# Patient Record
Sex: Female | Born: 1935 | Race: Black or African American | Hispanic: No | Smoking: Never smoker
Health system: Southern US, Community
[De-identification: ages and names within clinical notes are randomized; demographics above are authoritative.]

## PROBLEM LIST (undated history)

## (undated) DIAGNOSIS — E785 Hyperlipidemia, unspecified: Secondary | ICD-10-CM

## (undated) DIAGNOSIS — E43 Unspecified severe protein-calorie malnutrition: Secondary | ICD-10-CM

## (undated) DIAGNOSIS — W19XXXA Unspecified fall, initial encounter: Secondary | ICD-10-CM

## (undated) DIAGNOSIS — I639 Cerebral infarction, unspecified: Secondary | ICD-10-CM

## (undated) DIAGNOSIS — I2510 Atherosclerotic heart disease of native coronary artery without angina pectoris: Secondary | ICD-10-CM

## (undated) DIAGNOSIS — E559 Vitamin D deficiency, unspecified: Secondary | ICD-10-CM

## (undated) DIAGNOSIS — F0390 Unspecified dementia without behavioral disturbance: Secondary | ICD-10-CM

## (undated) DIAGNOSIS — R55 Syncope and collapse: Secondary | ICD-10-CM

## (undated) DIAGNOSIS — D72829 Elevated white blood cell count, unspecified: Secondary | ICD-10-CM

## (undated) DIAGNOSIS — I69391 Dysphagia following cerebral infarction: Secondary | ICD-10-CM

## (undated) DIAGNOSIS — S72002A Fracture of unspecified part of neck of left femur, initial encounter for closed fracture: Secondary | ICD-10-CM

## (undated) DIAGNOSIS — N183 Chronic kidney disease, stage 3 (moderate): Secondary | ICD-10-CM

## (undated) DIAGNOSIS — E876 Hypokalemia: Secondary | ICD-10-CM

## (undated) DIAGNOSIS — I10 Essential (primary) hypertension: Secondary | ICD-10-CM

## (undated) DIAGNOSIS — N179 Acute kidney failure, unspecified: Secondary | ICD-10-CM

## (undated) DIAGNOSIS — F418 Other specified anxiety disorders: Secondary | ICD-10-CM

## (undated) DIAGNOSIS — Z8673 Personal history of transient ischemic attack (TIA), and cerebral infarction without residual deficits: Secondary | ICD-10-CM

## (undated) DIAGNOSIS — G459 Transient cerebral ischemic attack, unspecified: Secondary | ICD-10-CM

## (undated) HISTORY — DX: Unspecified severe protein-calorie malnutrition: E43

## (undated) HISTORY — DX: Unspecified fall, initial encounter: W19.XXXA

## (undated) HISTORY — DX: Fracture of unspecified part of neck of left femur, initial encounter for closed fracture: S72.002A

## (undated) HISTORY — PX: ABDOMINAL HYSTERECTOMY: SUR658

## (undated) HISTORY — DX: Personal history of transient ischemic attack (TIA), and cerebral infarction without residual deficits: Z86.73

## (undated) HISTORY — DX: Acute kidney failure, unspecified: N17.9

## (undated) HISTORY — DX: Hypokalemia: E87.6

## (undated) HISTORY — DX: Elevated white blood cell count, unspecified: D72.829

## (undated) HISTORY — DX: Cerebral infarction, unspecified: I63.9

## (undated) HISTORY — DX: Chronic kidney disease, stage 3 (moderate): N18.3

## (undated) HISTORY — DX: Vitamin D deficiency, unspecified: E55.9

## (undated) HISTORY — DX: Syncope and collapse: R55

## (undated) HISTORY — DX: Dysphagia following cerebral infarction: I69.391

---

## 2009-02-06 ENCOUNTER — Emergency Department (HOSPITAL_COMMUNITY): Admission: EM | Admit: 2009-02-06 | Discharge: 2009-02-07 | Payer: Self-pay | Admitting: Emergency Medicine

## 2009-02-13 ENCOUNTER — Ambulatory Visit (HOSPITAL_COMMUNITY): Admission: RE | Admit: 2009-02-13 | Discharge: 2009-02-13 | Payer: Self-pay | Admitting: Emergency Medicine

## 2010-01-09 ENCOUNTER — Encounter: Admission: RE | Admit: 2010-01-09 | Discharge: 2010-01-31 | Payer: Self-pay | Admitting: Internal Medicine

## 2010-12-10 LAB — GLUCOSE, CAPILLARY

## 2010-12-10 LAB — DIFFERENTIAL
Lymphocytes Relative: 16 % (ref 12–46)
Lymphs Abs: 1 10*3/uL (ref 0.7–4.0)
Monocytes Absolute: 0.4 10*3/uL (ref 0.1–1.0)
Monocytes Relative: 5 % (ref 3–12)
Neutro Abs: 5 10*3/uL (ref 1.7–7.7)

## 2010-12-10 LAB — POCT CARDIAC MARKERS
CKMB, poc: <1 ng/mL — ABNORMAL LOW (ref 1.0–8.0)
Troponin i, poc: <0.05 ng/mL (ref 0.00–0.09)

## 2010-12-10 LAB — POCT I-STAT, CHEM 8
BUN: 25 mg/dL — ABNORMAL HIGH (ref 6–23)
Chloride: 106 mEq/L (ref 96–112)
Creatinine, Ser: 1.5 mg/dL — ABNORMAL HIGH (ref 0.4–1.2)
Hemoglobin: 11.9 g/dL — ABNORMAL LOW (ref 12.0–15.0)
Potassium: 4.2 mEq/L (ref 3.5–5.1)
Sodium: 138 mEq/L (ref 135–145)

## 2010-12-10 LAB — URINALYSIS, ROUTINE W REFLEX MICROSCOPIC
Specific Gravity, Urine: 1.012 (ref 1.005–1.030)
Urobilinogen, UA: 0.2 mg/dL (ref 0.0–1.0)

## 2010-12-10 LAB — CBC
Hemoglobin: 11.3 g/dL — ABNORMAL LOW (ref 12.0–15.0)
RBC: 3.99 MIL/uL (ref 3.87–5.11)

## 2010-12-10 LAB — URINE MICROSCOPIC-ADD ON

## 2010-12-10 LAB — URINE CULTURE

## 2011-01-15 NOTE — Procedures (Signed)
EEG NUMBER:  [id number].   HISTORY:  This patient is an 77 year old with a history of a seizure  type event the week prior to the study.  The patient had confusion  rolling back of the head and urinary incontinence.  The patient had been  evaluated for seizures.   MEDICATIONS:  Atenolol, lorazepam, Aricept and Crestor.   This is a routine EEG.  No skull defects noted.   EEG CLASSIFICATION:  Normal awake.   DESCRIPTION OF THE RECORDING:  Background rhythm of this recording  consists of fairly well-modulated medium amplitude alpha rhythm of 9 Hz  that is reactive to eye opening and closure.  As the record progresses,  the patient remains in the waking state throughout the recording.  Photic stimulation and hyperventilation were not performed.  At no time  during the recording there appeared to be evidence of spike, spike wave  discharges, or evidence of focal slowing.  EKG monitor shows no evidence  of cardiac rhythm abnormalities with a heart rate of 72.   IMPRESSION:  This is a normal EEG recording in the waking state.  No  evidence of ictal or interictal discharges were seen.      Marlan Palau, M.D.  Electronically Signed     EAV:[id number]  D:  02/13/2009 19:37:06  T:  02/14/2009 07:45:38  Job #:  [id number]

## 2012-05-18 ENCOUNTER — Other Ambulatory Visit: Payer: Self-pay | Admitting: Family Medicine

## 2012-06-09 ENCOUNTER — Other Ambulatory Visit: Payer: Self-pay | Admitting: Family Medicine

## 2012-06-16 ENCOUNTER — Other Ambulatory Visit: Payer: Self-pay | Admitting: Family Medicine

## 2013-05-23 ENCOUNTER — Emergency Department (HOSPITAL_COMMUNITY): Payer: Medicare Other

## 2013-05-23 ENCOUNTER — Encounter (HOSPITAL_COMMUNITY): Payer: Self-pay | Admitting: Emergency Medicine

## 2013-05-23 ENCOUNTER — Inpatient Hospital Stay (HOSPITAL_COMMUNITY)
Admission: EM | Admit: 2013-05-23 | Discharge: 2013-05-25 | DRG: 312 | Disposition: A | Discharge status: Home / Self Care | Payer: Medicare Other | Attending: Internal Medicine | Admitting: Internal Medicine

## 2013-05-23 DIAGNOSIS — R5381 Other malaise: Secondary | ICD-10-CM | POA: Diagnosis present

## 2013-05-23 DIAGNOSIS — R079 Chest pain, unspecified: Secondary | ICD-10-CM | POA: Diagnosis present

## 2013-05-23 DIAGNOSIS — R404 Transient alteration of awareness: Secondary | ICD-10-CM | POA: Diagnosis present

## 2013-05-23 DIAGNOSIS — E876 Hypokalemia: Secondary | ICD-10-CM | POA: Diagnosis present

## 2013-05-23 DIAGNOSIS — Z87891 Personal history of nicotine dependence: Secondary | ICD-10-CM

## 2013-05-23 DIAGNOSIS — I498 Other specified cardiac arrhythmias: Secondary | ICD-10-CM | POA: Diagnosis present

## 2013-05-23 DIAGNOSIS — N390 Urinary tract infection, site not specified: Secondary | ICD-10-CM | POA: Diagnosis present

## 2013-05-23 DIAGNOSIS — I10 Essential (primary) hypertension: Secondary | ICD-10-CM | POA: Diagnosis present

## 2013-05-23 DIAGNOSIS — R55 Syncope and collapse: Principal | ICD-10-CM | POA: Diagnosis present

## 2013-05-23 DIAGNOSIS — Z7982 Long term (current) use of aspirin: Secondary | ICD-10-CM

## 2013-05-23 DIAGNOSIS — I2510 Atherosclerotic heart disease of native coronary artery without angina pectoris: Secondary | ICD-10-CM | POA: Diagnosis present

## 2013-05-23 DIAGNOSIS — Z880 Allergy status to penicillin: Secondary | ICD-10-CM

## 2013-05-23 DIAGNOSIS — N179 Acute kidney failure, unspecified: Secondary | ICD-10-CM | POA: Diagnosis present

## 2013-05-23 HISTORY — DX: Atherosclerotic heart disease of native coronary artery without angina pectoris: I25.10

## 2013-05-23 HISTORY — DX: Unspecified dementia, unspecified severity, without behavioral disturbance, psychotic disturbance, mood disturbance, and anxiety: F03.90

## 2013-05-23 HISTORY — DX: Hyperlipidemia, unspecified: E78.5

## 2013-05-23 HISTORY — DX: Essential (primary) hypertension: I10

## 2013-05-23 LAB — BASIC METABOLIC PANEL
BUN: 17 mg/dL (ref 6–23)
CO2: 28 mEq/L (ref 19–32)
Calcium: 9.6 mg/dL (ref 8.4–10.5)
Chloride: 105 mEq/L (ref 96–112)
Creatinine, Ser: 1.53 mg/dL — ABNORMAL HIGH (ref 0.50–1.10)
GFR calc Af Amer: 33 mL/min — ABNORMAL LOW (ref >90)
GFR calc non Af Amer: 28 mL/min — ABNORMAL LOW (ref >90)
Glucose, Bld: 97 mg/dL (ref 70–99)
Potassium: 3.8 mEq/L (ref 3.5–5.1)
Sodium: 142 mEq/L (ref 135–145)

## 2013-05-23 LAB — CBC WITH DIFFERENTIAL/PLATELET
Basophils Absolute: 0.1 10*3/uL (ref 0.0–0.1)
Basophils Relative: 1 % (ref 0–1)
Eosinophils Absolute: 0.1 10*3/uL (ref 0.0–0.7)
Eosinophils Relative: 1 % (ref 0–5)
HCT: 36.1 % (ref 36.0–46.0)
Hemoglobin: 12.2 g/dL (ref 12.0–15.0)
Lymphocytes Relative: 12 % (ref 12–46)
Lymphs Abs: 1.1 10*3/uL (ref 0.7–4.0)
MCH: 27.7 pg (ref 26.0–34.0)
MCHC: 33.8 g/dL (ref 30.0–36.0)
MCV: 82 fL (ref 78.0–100.0)
Monocytes Absolute: 0.7 10*3/uL (ref 0.1–1.0)
Monocytes Relative: 8 % (ref 3–12)
Neutro Abs: 7.4 10*3/uL (ref 1.7–7.7)
Neutrophils Relative %: 79 % — ABNORMAL HIGH (ref 43–77)
Platelets: 210 10*3/uL (ref 150–400)
RBC: 4.4 MIL/uL (ref 3.87–5.11)
RDW: 13.9 % (ref 11.5–15.5)
WBC: 9.3 10*3/uL (ref 4.0–10.5)

## 2013-05-23 NOTE — ED Notes (Signed)
PA at bedside.

## 2013-05-23 NOTE — ED Notes (Signed)
Pollina, MD made aware the the pt is waiting for admission orders.

## 2013-05-23 NOTE — ED Notes (Signed)
Pt arrived from home by [facility]. C/o weakness for the past couple of days. Daughter stated pt c/o CP last night. No c/o CP at this time. New onset Afib. HR-85 irreg, BP-102/72

## 2013-05-23 NOTE — ED Notes (Signed)
Pt presents with new onset of afib, weakness X a couple of days and a syncopal episode while sitting on toilet. Per daughter LOC lasted approx 1 minute. Pt denies any pain or injury r/t to syncopal episode

## 2013-05-23 NOTE — ED Notes (Signed)
Pt remains in CT at this time.  

## 2013-05-24 ENCOUNTER — Encounter (HOSPITAL_COMMUNITY): Payer: Self-pay | Admitting: Internal Medicine

## 2013-05-24 DIAGNOSIS — R55 Syncope and collapse: Secondary | ICD-10-CM

## 2013-05-24 HISTORY — DX: Syncope and collapse: R55

## 2013-05-24 LAB — URINALYSIS, ROUTINE W REFLEX MICROSCOPIC
Glucose, UA: NEGATIVE mg/dL
Ketones, ur: 15 mg/dL — AB
Nitrite: NEGATIVE
Protein, ur: 30 mg/dL — AB
Specific Gravity, Urine: 1.018 (ref 1.005–1.030)
Urobilinogen, UA: 1 mg/dL (ref 0.0–1.0)
pH: 6 (ref 5.0–8.0)

## 2013-05-24 LAB — TROPONIN I
Troponin I: <0.3 ng/mL (ref <0.30)
Troponin I: <0.3 ng/mL (ref <0.30)

## 2013-05-24 LAB — CBC
Hemoglobin: 11.4 g/dL — ABNORMAL LOW (ref 12.0–15.0)
MCH: 27.9 pg (ref 26.0–34.0)
RBC: 4.08 MIL/uL (ref 3.87–5.11)
WBC: 10 10*3/uL (ref 4.0–10.5)

## 2013-05-24 LAB — BASIC METABOLIC PANEL
CO2: 26 mEq/L (ref 19–32)
Chloride: 105 mEq/L (ref 96–112)
Glucose, Bld: 84 mg/dL (ref 70–99)
Potassium: 3.3 mEq/L — ABNORMAL LOW (ref 3.5–5.1)
Sodium: 141 mEq/L (ref 135–145)

## 2013-05-24 LAB — URINE MICROSCOPIC-ADD ON

## 2013-05-24 MED ORDER — BISMUTH SUBSALICYLATE 262 MG/15ML PO SUSP: 30.0000 mL | Freq: Four times a day (QID) | ORAL | Status: DC | PRN

## 2013-05-24 MED ORDER — LORAZEPAM 0.5 MG PO TABS
0.5000 mg | ORAL_TABLET | Freq: Two times a day (BID) | ORAL | Status: DC
  Administered 2013-05-24 – 2013-05-25 (×4): 0.5 mg via ORAL
  Filled 2013-05-24 (×4): qty 1

## 2013-05-24 MED ORDER — SODIUM CHLORIDE 0.9 % IV SOLN
INTRAVENOUS | Status: DC
  Administered 2013-05-24 – 2013-05-25 (×2): via INTRAVENOUS

## 2013-05-24 MED ORDER — MEMANTINE HCL 10 MG PO TABS
10.0000 mg | ORAL_TABLET | Freq: Two times a day (BID) | ORAL | Status: DC
  Administered 2013-05-24 – 2013-05-25 (×4): 10 mg via ORAL
  Filled 2013-05-24 (×5): qty 1

## 2013-05-24 MED ORDER — SODIUM CHLORIDE 0.9 % IJ SOLN
3.0000 mL | Freq: Two times a day (BID) | INTRAMUSCULAR | Status: DC
  Administered 2013-05-24 (×2): 3 mL via INTRAVENOUS

## 2013-05-24 MED ORDER — POTASSIUM CHLORIDE CRYS ER 20 MEQ PO TBCR
40.0000 meq | EXTENDED_RELEASE_TABLET | Freq: Once | ORAL | Status: AC
  Administered 2013-05-24: 40 meq via ORAL
  Filled 2013-05-24: qty 2

## 2013-05-24 MED ORDER — ATORVASTATIN CALCIUM 10 MG PO TABS
10.0000 mg | ORAL_TABLET | Freq: Every day | ORAL | Status: DC
  Administered 2013-05-24 – 2013-05-25 (×2): 10 mg via ORAL
  Filled 2013-05-24 (×2): qty 1

## 2013-05-24 MED ORDER — HEPARIN SODIUM (PORCINE) 5000 UNIT/ML IJ SOLN
5000.0000 [IU] | Freq: Three times a day (TID) | INTRAMUSCULAR | Status: DC
  Administered 2013-05-24 – 2013-05-25 (×4): 5000 [IU] via SUBCUTANEOUS
  Filled 2013-05-24 (×6): qty 1

## 2013-05-24 MED ORDER — ATENOLOL 12.5 MG HALF TABLET
12.5000 mg | ORAL_TABLET | Freq: Every day | ORAL | Status: DC
  Administered 2013-05-24 – 2013-05-25 (×2): 12.5 mg via ORAL
  Filled 2013-05-24 (×2): qty 1

## 2013-05-24 MED ORDER — ASPIRIN EC 81 MG PO TBEC
81.0000 mg | DELAYED_RELEASE_TABLET | Freq: Every day | ORAL | Status: DC
  Administered 2013-05-24 – 2013-05-25 (×2): 81 mg via ORAL
  Filled 2013-05-24 (×2): qty 1

## 2013-05-24 MED ORDER — LORAZEPAM 0.5 MG PO TABS: 0.5000 mg | ORAL_TABLET | Freq: Two times a day (BID) | ORAL | Status: DC

## 2013-05-24 NOTE — ED Provider Notes (Signed)
CSN: [id number]     Arrival date & time 05/23/13  1629 History   First MD Initiated Contact with Patient 05/23/13 1631     Chief Complaint  Patient presents with  . Atrial Fibrillation  . Weakness  . Loss of Consciousness   (Consider location/radiation/quality/duration/timing/severity/associated sxs/prior Treatment) HPI Patient presents emergency department following a syncopal episode that occurred just prior to arrival.  Daughter is giving the history the patient was unresponsive for 10 minutes.  The patient had had chest pain, and weakness over the last few days.  The patient denies shortness of breath, nausea, vomiting, dizziness, headache, blurred vision, fever, cough, dysuria, seizure or diarrhea.  The patient, states, that she still feels a little weak.  The daughter states the patient had syncope previously, but not for this long period of time     Past Medical History  Diagnosis Date  . Hypertension   . Coronary artery disease    No past surgical history on file. No family history on file. History  Substance Use Topics  . Smoking status: Former Games developer  . Smokeless tobacco: Not on file  . Alcohol Use: No   OB History   Grav Para Term Preterm Abortions TAB SAB Ect Mult Living                 Review of Systems All other systems negative except as documented in the HPI. All pertinent positives and negatives as reviewed in the HPI. Allergies  Penicillins  Home Medications  No current outpatient prescriptions on file. BP 114/59  Pulse 72  Temp(Src) 99 F (37.2 C) (Oral)  Resp 16  Ht 5\' 4"  (1.626 m)  Wt 123 lb 0.3 oz (55.8 kg)  BMI 21.11 kg/m2  SpO2 98% Physical Exam  Nursing note and vitals reviewed. Constitutional: She is oriented to person, place, and time. She appears well-developed and well-nourished.  HENT:  Head: Normocephalic and atraumatic.  Mouth/Throat: Oropharynx is clear and moist.  Eyes: Pupils are equal, round, and reactive to light.  Neck:  Normal range of motion. Neck supple.  Cardiovascular: Normal rate, regular rhythm and normal heart sounds.  Exam reveals no gallop and no friction rub.   No murmur heard. Pulmonary/Chest: Effort normal and breath sounds normal. No respiratory distress.  Abdominal: Soft. Bowel sounds are normal. She exhibits no distension. There is no tenderness.  Musculoskeletal: She exhibits no edema.  Neurological: She is alert and oriented to person, place, and time. She exhibits normal muscle tone. Coordination normal.  Skin: Skin is warm and dry. No rash noted. No erythema.    ED Course  Procedures (including critical care time) Labs Review Labs Reviewed  CBC WITH DIFFERENTIAL - Abnormal; Notable for the following:    Neutrophils Relative % 79 (*)    All other components within normal limits  BASIC METABOLIC PANEL - Abnormal; Notable for the following:    Creatinine, Ser 1.53 (*)    GFR calc non Af Amer 28 (*)    GFR calc Af Amer 33 (*)    All other components within normal limits  URINALYSIS, ROUTINE W REFLEX MICROSCOPIC  CBC  BASIC METABOLIC PANEL  TROPONIN I  TROPONIN I  TROPONIN I   Imaging Review Ct Head Wo Contrast  05/23/2013   CLINICAL DATA:  Altered mental status  EXAM: CT HEAD WITHOUT CONTRAST  TECHNIQUE: Contiguous axial images were obtained from the base of the skull through the vertex without intravenous contrast. Study was obtained within 24 hr of  patient's arrival at the emergency department  COMPARISON:  February 06, 2009  FINDINGS: Moderate diffuse atrophy is stable. There is no mass, hemorrhage, extra-axial fluid collection, or midline shift. There is patchy small vessel disease in the centra semiovale bilaterally. There is a prior infarct at the gray-white junction of the anterior right temporal lobe at the level of the superior insula on the right the with some involvement of the right external and extreme capsules on the right anteriorly. There is evidence of a prior small infarct  in the anterior limb of the left external capsule. There is evidence of a prior tiny infarct in the left thalamus. No acute appearing infarct is seen.  Bony calvarium appears intact. Mastoid air cells are clear. There is mild mucosal thickening in several ethmoid air cells bilaterally. There is a small benign appearing enostosis in the right frontal region without surrounding edema, stable.  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 There is no demonstrable mass, hemorrhage, or acute appearing infarct.   Electronically Signed   By: Bretta Bang   On: 05/23/2013 18:43   patient be admitted to the hospital for syncope, and further evaluation.  Patient is advised of the plan and all questions were answered.  Spoke with the, [facility], who will be down to evaluate the patient for admission  MDM   1. Syncope     Date: 05/24/2013  Rate: 89  Rhythm: normal sinus rhythm  QRS Axis: normal  Intervals: normal  ST/T Wave abnormalities: nonspecific T wave changes  Conduction Disutrbances:Bigemny  Narrative Interpretation:   Old EKG Reviewed: unchanged      Carlyle Dolly, PA-C 05/24/13 1627  Carlyle Dolly, PA-C 05/24/13 [phone]

## 2013-05-24 NOTE — H&P (Signed)
[facility] History and Physical  Ariel Douglas ZOX:[id number] DOB: July 24, 1919 DOA: 05/23/2013  Referring physician: ED PCP: No primary provider on file.    Chief Complaint: Syncope  HPI: Ariel Douglas is a 79 y.o. female who presents to the ED with weakness over the past couple of days.  Patient did have abdominal pain last night that woke her up but this completely resolved with peptobismol.  Today she had a syncopal episode while using the bathroom and was slumped against the counter.  LOC lasted about 1 min, and she was confused after the episode still some 10 min later.  Patient is currently back to normal mentation.  No injury or pain due to trauma from syncopal episode, no chest pain related to syncopal episode.  In the ED patient was reportedly in A.Fib; however, her EKG done initially appears to be a sinus bigeminy with PACs.  She is now in NSR.  Review of Systems: The family brings up that the patient hasnt been drinking enough fluid recently and when she does drink its a small amount of green tea.  12 systems reviewed and otherwise negative. Past Medical History  Diagnosis Date  . Hypertension   . Coronary artery disease    No past surgical history on file. Social History:  reports that she has quit smoking. She does not have any smokeless tobacco history on file. She reports that she does not drink alcohol or use illicit drugs.   Allergies  Allergen Reactions  . Penicillins Swelling    No family history on file.  Prior to Admission medications   Medication Sig Start Date End Date Taking? Authorizing Provider  aspirin (ASPIRIN EC) 81 MG EC tablet Take 81 mg by mouth daily. Swallow whole.   Yes Historical Provider, MD  atenolol (TENORMIN) 25 MG tablet Take 12.5 mg by mouth daily.   Yes Historical Provider, MD  bismuth subsalicylate (PEPTO BISMOL) 262 MG/15ML suspension Take 30 mLs by mouth every 6 (six) hours as needed for indigestion.   Yes Historical Provider, MD   Calcium Carb-Cholecalciferol (CALCIUM 500 +D) 500-400 MG-UNIT TABS Take 1 tablet by mouth 2 (two) times daily.   Yes Historical Provider, MD  l-methylfolate-b2-b6-b12 (CEREFOLIN) 01-31-49-5 MG TABS Take 1 tablet by mouth daily.   Yes Historical Provider, MD  LORazepam (ATIVAN) 1 MG tablet Take 0.5 mg by mouth 2 (two) times daily.    Yes Historical Provider, MD  memantine (NAMENDA) 10 MG tablet Take 10 mg by mouth 2 (two) times daily.   Yes Historical Provider, MD  Multiple Vitamins-Minerals (MH MACULAR HEALTH PO) Take 1 capsule by mouth 2 (two) times daily.   Yes Historical Provider, MD  rosuvastatin (CRESTOR) 5 MG tablet Take 5 mg by mouth daily.   Yes Historical Provider, MD   Physical Exam: Filed Vitals:   05/24/13 0012  BP: 114/59  Pulse: 72  Temp: 99 F (37.2 C)  Resp: 16    General:  NAD, resting comfortably in bed Eyes: PEERLA EOMI ENT: mucous membranes moist Neck: supple w/o JVD Cardiovascular: RRR w/o MRG Respiratory: CTA B Abdomen: soft, nt, nd, bs+ Skin: no rash nor lesion Musculoskeletal: MAE, full ROM all 4 extremities Psychiatric: normal tone and affect Neurologic: AAOx3, grossly non-focal  Labs on Admission:  Basic Metabolic Panel:  Recent Labs Lab 05/23/13 1821  NA 142  K 3.8  CL 105  CO2 28  GLUCOSE 97  BUN 17  CREATININE 1.53*  CALCIUM 9.6   Liver Function Tests: No results  found for this basename: AST, ALT, ALKPHOS, BILITOT, PROT, ALBUMIN,  in the last 168 hours No results found for this basename: LIPASE, AMYLASE,  in the last 168 hours No results found for this basename: AMMONIA,  in the last 168 hours CBC:  Recent Labs Lab 05/23/13 1821  WBC 9.3  NEUTROABS 7.4  HGB 12.2  HCT 36.1  MCV 82.0  PLT 210   Cardiac Enzymes: No results found for this basename: CKTOTAL, CKMB, CKMBINDEX, TROPONINI,  in the last 168 hours  BNP (last 3 results) No results found for this basename: PROBNP,  in the last 8760 hours CBG: No results found for this  basename: GLUCAP,  in the last 168 hours  Radiological Exams on Admission: Ct Head Wo Contrast  05/23/2013   CLINICAL DATA:  Altered mental status  EXAM: CT HEAD WITHOUT CONTRAST  TECHNIQUE: Contiguous axial images were obtained from the base of the skull through the vertex without intravenous contrast. Study was obtained within 24 hr of patient's arrival at the emergency department  COMPARISON:  February 06, 2009  FINDINGS: Moderate diffuse atrophy is stable. There is no mass, hemorrhage, extra-axial fluid collection, or midline shift. There is patchy small vessel disease in the centra semiovale bilaterally. There is a prior infarct at the gray-white junction of the anterior right temporal lobe at the level of the superior insula on the right the with some involvement of the right external and extreme capsules on the right anteriorly. There is evidence of a prior small infarct in the anterior limb of the left external capsule. There is evidence of a prior tiny infarct in the left thalamus. No acute appearing infarct is seen.  Bony calvarium appears intact. Mastoid air cells are clear. There is mild mucosal thickening in several ethmoid air cells bilaterally. There is a small benign appearing enostosis in the right frontal region without surrounding edema, stable.  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 There is no demonstrable mass, hemorrhage, or acute appearing infarct.   Electronically Signed   By: Bretta Bang   On: 05/23/2013 18:43    EKG: Independently reviewed.  Currently NSR, ED EKG shows Bigeminy.  Assessment/Plan Principal Problem:   Syncope   1. Syncope - concern by ED given the length of time patient was unconscious, does have EKG change in ED of bigeminy (initially believed by ED to be new A.Fib) but after discussion with Dr. Sondra Come this is not typically a rhythm that causes syncope or other symptoms.  2d echo, trops ordered, tele monitor  ordered.    Code Status: Full Code (must indicate code status--if unknown or must be presumed, indicate so) Family Communication: Spoke with family in room (indicate person spoken with, if applicable, with phone number if by telephone) Disposition Plan: Admit to inpatient (indicate anticipated LOS)  Time spent: 70 min  Audrina Marten M. [facility] Pager [phone]  If 7PM-7AM, please contact night-coverage [email or web address] Password [facility] 05/24/2013, 12:46 AM

## 2013-05-24 NOTE — Care Management Note (Signed)
    Page 1 of 1   05/25/2013     4:07:28 PM   CARE MANAGEMENT NOTE 05/25/2013  Patient:  Ariel Douglas,Ariel Douglas   Account Number:  [id number]  Date Initiated:  05/24/2013  Documentation initiated by:  [facility]  Subjective/Objective Assessment:   Syncope     Action/Plan:   lives at home   Anticipated DC Date:  05/26/2013   Anticipated DC Plan:  HOME W HOME HEALTH SERVICES      DC Planning Services  CM consult      Choice offered to / List presented to:             Status of service:  Completed, signed off Medicare Important Message given?   (If response is "NO", the following Medicare IM given date fields will be blank) Date Medicare IM given:   Date Additional Medicare IM given:    Discharge Disposition:  HOME/SELF CARE  Per UR Regulation:  Reviewed for med. necessity/level of care/duration of stay  If discussed at Long Length of Stay Meetings, dates discussed:    Comments:  05/25/13 Ariel Burgert,RN,BSN [phone] CM REFERRAL FOR PCP ISSUES.  SPOKE WITH PT'S DAUGHTER, Ariel Douglas, AS PT HAS DEMENTIA.  DAUGHTER STATES PT'S PCP IS Ariel Douglas WITH [facility].  NOTIFIED ATTENDING MD OF THIS.  PT/OT RECOMMENDING NO HH FOLLOW UP AT DC.  DAUGHTER CONFIRMS THAT SHE IS WITH PT AT ALL TIMES. PT INTERESTED IN POSSIBLY APPLYING FOR MEDICAID FOR PT AND CHECKING INTO PCP SERVICES.  INSTRUCTED DTR TO GO TO [place] [employer] TO APPLY FOR MEDICAID AND PCS.  CALLED ADMITTING TO UPDATE PCP INFO IN EPIC.   05/24/13 Ariel Nuss,RN,BSN [phone] PT/OT CONSULTS PENDING.  PTA, PT RESIDES AT HOME WITH DAUGHTER.  WILL FOLLOW FOR DISCHARGE NEEDS.

## 2013-05-24 NOTE — Evaluation (Signed)
Physical Therapy Evaluation/ Discharge Patient Details Name: Ariel Douglas MRN: [id number] DOB: 30-Nov-1918 Today's Date: 05/24/2013 Time: 1340-1401 PT Time Calculation (min): 21 min  PT Assessment / Plan / Recommendation History of Present Illness  Ariel Douglas is a 79 y.o. female who presents to the ED with weakness over the past couple of days.  Patient did have abdominal pain last night that woke her up but this completely resolved with peptobismol.  Today she had a syncopal episode while using the bathroom and was slumped against the counter.  LOC lasted about 1 min, and she was confused after the episode still some 10 min later.  Patient is currently back to normal mentation.  No injury or pain due to trauma from syncopal episode, no chest pain related to syncopal episode  Clinical Impression  Pt is pleasantly confused and spoke with dgtr on phone who confirms she is at home to supervise pt at all times other than church. Encouraged dgtr to have someone with pt when she is at church and to encourage use of RW if pt can remember it. Pt relaying information that she lives alone, walks 5 flights and does her own cooking all of which is incorrect. Pt at baseline functional level able to perform own pericare and mobility with supervision. No further needs at this time and both pt and dgtr agree. Will sign off.    PT Assessment  Patent does not need any further PT services    Follow Up Recommendations  No PT follow up    Does the patient have the potential to tolerate intense rehabilitation      Barriers to Discharge        Equipment Recommendations  None recommended by PT    Recommendations for Other Services     Frequency      Precautions / Restrictions Precautions Precautions: Fall   Pertinent Vitals/Pain No pain, no dizziness, VSS      Mobility  Bed Mobility Bed Mobility: Not assessed Details for Bed Mobility Assistance: pt in recliner on arrival Transfers Transfers: Sit  to Stand;Stand to Sit Sit to Stand: 5: Supervision;From chair/3-in-1;From toilet Stand to Sit: 5: Supervision;To toilet;To chair/3-in-1 Details for Transfer Assistance: cueing for hand placement Ambulation/Gait Ambulation/Gait Assistance: 4: Min guard Ambulation Distance (Feet): 300 Feet Assistive device: None Ambulation/Gait Assistance Details: pt at times reaching out for environmental supports with slightly unsteady gait but able to maintain balance with environmental supports. Dgtr states she has a RW but can't remember to use it and that there have been no falls in the last year. Gait Pattern: Step-through pattern;Decreased stride length Gait velocity: decreased Stairs: Yes Stairs Assistance: 5: Supervision Stairs Assistance Details (indicate cue type and reason): supervision for safety Stair Management Technique: One rail Right;Forwards;Alternating pattern Number of Stairs: 3    Exercises     PT Diagnosis:    PT Problem List:   PT Treatment Interventions:       PT Goals(Current goals can be found in the care plan section) Acute Rehab PT Goals Patient Stated Goal: "return home by myself" PT Goal Formulation: No goals set, d/c therapy  Visit Information  Last PT Received On: 05/24/13 Assistance Needed: +1 History of Present Illness: Ariel Douglas is a 79 y.o. female who presents to the ED with weakness over the past couple of days.  Patient did have abdominal pain last night that woke her up but this completely resolved with peptobismol.  Today she had a syncopal episode while using the bathroom  and was slumped against the counter.  LOC lasted about 1 min, and she was confused after the episode still some 10 min later.  Patient is currently back to normal mentation.  No injury or pain due to trauma from syncopal episode, no chest pain related to syncopal episode       Prior Functioning  Home Living Family/patient expects to be discharged to:: Private residence Living  Arrangements: Children Available Help at Discharge: Family;Available 24 hours/day Type of Home: House Home Access: Stairs to enter [employer] of Steps: 2 Home Layout: Two level;Bed/bath upstairs Alternate Level Stairs-Number of Steps: 12 Alternate Level Stairs-Rails: Right Home Equipment: Walker - 2 wheels Prior Function Level of Independence: Needs assistance Gait / Transfers Assistance Needed: independent for mobility with occasional furniture walking ADL's / Homemaking Assistance Needed: dgtr does all cooking and houswork, pt straightens her room and dgtr supervises her into the bathtub but pt bathes herself Communication Communication: No difficulties    Cognition  Cognition Arousal/Alertness: Awake/alert Behavior During Therapy: WFL for tasks assessed/performed Overall Cognitive Status: History of cognitive impairments - at baseline Memory: Decreased short-term memory    Extremity/Trunk Assessment Upper Extremity Assessment Upper Extremity Assessment: Generalized weakness Lower Extremity Assessment Lower Extremity Assessment: Generalized weakness Cervical / Trunk Assessment Cervical / Trunk Assessment: Normal   Balance    End of Session PT - End of Session Equipment Utilized During Treatment: Gait belt Activity Tolerance: Patient tolerated treatment well Patient left: in chair;with call bell/phone within reach;with nursing/sitter in room;with chair alarm set Nurse Communication: Mobility status  GP     Ariel Douglas 05/24/2013, 2:27 PM Delaney Meigs, PT [phone]

## 2013-05-24 NOTE — Progress Notes (Signed)
[facility] PROGRESS NOTE  Ariel Douglas OZH:[id number] DOB: 1919-01-24 DOA: 05/23/2013 PCP: No primary provider on file.  Assessment/Plan: 1-Syncope: probably vaso-vagal. Monitor on telemetry. ECHO pending. Troponin times 2 negative.   2-Hypokalemia: will order 40 Meq times one.   3-urine culture: pending.   4-Acute renal failure: unclear baseline. Cr at 1.5. I will start IV fluids. Repeat B-met in am.  5-Bigeminy: Continue with B-Blocker.   Code Status: Full Code.  Family Communication: none at bedside.  Disposition Plan: remain inpatient.    Consultants:  none  Procedures:  none  Antibiotics: none  HPI/Subjective: No chest pain or dyspnea.   Objective: Filed Vitals:   05/24/13 0432  BP: 131/74  Pulse: 96  Temp: 99 F (37.2 C)  Resp: 17    Intake/Output Summary (Last 24 hours) at 05/24/13 0929 Last data filed at 05/24/13 0838  Gross per 24 hour  Intake    240 ml  Output      0 ml  Net    240 ml   Filed Weights   05/23/13 1651 05/24/13 0012  Weight: 61.689 kg (136 lb) 55.8 kg (123 lb 0.3 oz)    Exam:   General:  No distress.  Cardiovascular: S 1, S 2 RRR  Respiratory: CTA  Abdomen: Bs presents, soft, NT  Musculoskeletal: no edema.   Data Reviewed: Basic Metabolic Panel:  Recent Labs Lab 05/23/13 1821 05/24/13 0200  NA 142 141  K 3.8 3.3*  CL 105 105  CO2 28 26  GLUCOSE 97 84  BUN 17 20  CREATININE 1.53* 1.58*  CALCIUM 9.6 9.2   Liver Function Tests: No results found for this basename: AST, ALT, ALKPHOS, BILITOT, PROT, ALBUMIN,  in the last 168 hours No results found for this basename: LIPASE, AMYLASE,  in the last 168 hours No results found for this basename: AMMONIA,  in the last 168 hours CBC:  Recent Labs Lab 05/23/13 1821 05/24/13 0200  WBC 9.3 10.0  NEUTROABS 7.4  --   HGB 12.2 11.4*  HCT 36.1 33.2*  MCV 82.0 81.4  PLT 210 198   Cardiac Enzymes:  Recent Labs Lab 05/24/13 0200 05/24/13 0820  TROPONINI  <0.30 <0.30   BNP (last 3 results) No results found for this basename: PROBNP,  in the last 8760 hours CBG: No results found for this basename: GLUCAP,  in the last 168 hours  No results found for this or any previous visit (from the past 240 hour(s)).   Studies: Ct Head Wo Contrast  05/23/2013   CLINICAL DATA:  Altered mental status  EXAM: CT HEAD WITHOUT CONTRAST  TECHNIQUE: Contiguous axial images were obtained from the base of the skull through the vertex without intravenous contrast. Study was obtained within 24 hr of patient's arrival at the emergency department  COMPARISON:  February 06, 2009  FINDINGS: Moderate diffuse atrophy is stable. There is no mass, hemorrhage, extra-axial fluid collection, or midline shift. There is patchy small vessel disease in the centra semiovale bilaterally. There is a prior infarct at the gray-white junction of the anterior right temporal lobe at the level of the superior insula on the right the with some involvement of the right external and extreme capsules on the right anteriorly. There is evidence of a prior small infarct in the anterior limb of the left external capsule. There is evidence of a prior tiny infarct in the left thalamus. No acute appearing infarct is seen.  Bony calvarium appears intact. Mastoid air cells are clear.  There is mild mucosal thickening in several ethmoid air cells bilaterally. There is a small benign appearing enostosis in the right frontal region without surrounding edema, stable.  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 There is no demonstrable mass, hemorrhage, or acute appearing infarct.   Electronically Signed   By: Bretta Bang   On: 05/23/2013 18:43    Scheduled Meds: . aspirin EC  81 mg Oral Daily  . atenolol  12.5 mg Oral Daily  . atorvastatin  10 mg Oral q1800  . heparin  5,000 Units Subcutaneous Q8H  . LORazepam  0.5 mg Oral BID  . memantine  10 mg Oral BID  . sodium chloride  3 mL  Intravenous Q12H   Continuous Infusions: . sodium chloride      Principal Problem:   Syncope    Time spent: 35 minutes.     Simonne Boulos  [facility] Pager [phone]. If 7PM-7AM, please contact night-coverage at [email or web address], password [facility] 05/24/2013, 9:29 AM  LOS: 1 day

## 2013-05-24 NOTE — Progress Notes (Signed)
Utilization review complete. Tangie Stay RN CCM Case Mgmt  

## 2013-05-25 ENCOUNTER — Encounter (HOSPITAL_COMMUNITY): Payer: Self-pay

## 2013-05-25 DIAGNOSIS — N179 Acute kidney failure, unspecified: Secondary | ICD-10-CM

## 2013-05-25 DIAGNOSIS — N390 Urinary tract infection, site not specified: Secondary | ICD-10-CM

## 2013-05-25 DIAGNOSIS — I059 Rheumatic mitral valve disease, unspecified: Secondary | ICD-10-CM

## 2013-05-25 HISTORY — DX: Acute kidney failure, unspecified: N17.9

## 2013-05-25 LAB — URINE CULTURE: Colony Count: >=100000

## 2013-05-25 LAB — BASIC METABOLIC PANEL
Calcium: 9 mg/dL (ref 8.4–10.5)
Chloride: 109 mEq/L (ref 96–112)
Creatinine, Ser: 1.3 mg/dL — ABNORMAL HIGH (ref 0.50–1.10)
GFR calc Af Amer: 40 mL/min — ABNORMAL LOW (ref >90)
GFR calc non Af Amer: 35 mL/min — ABNORMAL LOW (ref >90)
Glucose, Bld: 95 mg/dL (ref 70–99)

## 2013-05-25 MED ORDER — CIPROFLOXACIN HCL 250 MG PO TABS: 250.0000 mg | ORAL_TABLET | Freq: Two times a day (BID) | ORAL | Status: DC

## 2013-05-25 MED ORDER — CIPROFLOXACIN HCL 250 MG PO TABS: 250.0000 mg | ORAL_TABLET | Freq: Every day | ORAL | Status: DC

## 2013-05-25 NOTE — Discharge Summary (Signed)
Physician Discharge Summary  Ariel Douglas WGN:[id number] DOB: 1919-07-11 DOA: 05/23/2013  PCP: No primary provider on file.  Admit date: 05/23/2013 Discharge date: 05/25/2013  Time spent: 35  minutes  Recommendations for Outpatient Follow-up:  1. Need b-met to follow renal function. 2. Need EKG to follow up rhythm.  3. Follow urine culture results.   Discharge Diagnoses:    Syncope   UTI   Bigeminy   Acute Renal Failure.   Discharge Condition: Stable  Diet recommendation: Hear Healthy  Filed Weights   05/23/13 1651 05/24/13 0012  Weight: 61.689 kg (136 lb) 55.8 kg (123 lb 0.3 oz)    History of present illness:  Ariel Douglas is a 79 y.o. female who presents to the ED with weakness over the past couple of days. Patient did have abdominal pain last night that woke her up but this completely resolved with peptobismol. Today she had a syncopal episode while using the bathroom and was slumped against the counter. LOC lasted about 1 min, and she was confused after the episode still some 10 min later. Patient is currently back to normal mentation. No injury or pain due to trauma from syncopal episode, no chest pain related to syncopal episode.  In the ED patient was reportedly in A.Fib; however, her EKG done initially appears to be a sinus bigeminy with PACs. She is now in NSR.   Hospital Course:  1-Syncope: probably vaso-vagal. No significant arrythmia on  telemetry. ECHO with normal EF. Marland Kitchen Troponin times 2 negative.  2-Hypokalemia: resolved.  3-UTI: urine culture with multiple bacterial morphotype. Started on ciprofloxacin.  4-Acute renal failure: unclear baseline. Cr at 1.5. Improved with IV fluids. Cr decreases to 1.3.  5-Bigeminy: Continue with B-Blocker.    Procedures: ECHO: Left ventricle: The cavity size was normal. There was moderate concentric hypertrophy. Systolic function was vigorous. The estimated ejection fraction was in the range of 65% to 70%. Doppler parameters are  consistent with abnormal left ventricular relaxation (grade 1 diastolic dysfunction). The E/e' ratio is <10, suggesting normal LV filling pressure. - Aortic valve: Trileaflet. Sclerosis without stenosis. Trivial regurgitation. - Mitral valve: Mildly thickened leaflets . Mild to moderate regurgitation. - Left atrium: The atrium was normal in size. - Tricuspid valve: Mildly thickened leaflets. Mild regurgitation. - Pulmonary arteries: PA peak pressure: 24mm Hg (S). - Inferior vena cava: The vessel was normal in size; the respirophasic diameter changes were in the normal range (= 50%); findings     Consultations:  none  Discharge Exam: Filed Vitals:   05/25/13 0602  BP: 159/72  Pulse: 68  Temp: 97.9 F (36.6 C)  Resp: 18    General: No distress.  Cardiovascular: S 1, S 2 RRR Respiratory: CTA  Discharge Instructions  Discharge Orders   Future Orders Complete By Expires   Diet - low sodium heart healthy  As directed    Increase activity slowly  As directed        Medication List         aspirin EC 81 MG EC tablet  Generic drug:  aspirin  Take 81 mg by mouth daily. Swallow whole.     atenolol 25 MG tablet  Commonly known as:  TENORMIN  Take 12.5 mg by mouth daily.     bismuth subsalicylate 262 MG/15ML suspension  Commonly known as:  PEPTO BISMOL  Take 30 mLs by mouth every 6 (six) hours as needed for indigestion.     CALCIUM 500 +D 500-400 MG-UNIT Tabs  Generic drug:  Calcium Carb-Cholecalciferol  Take 1 tablet by mouth 2 (two) times daily.     ciprofloxacin 250 MG tablet  Commonly known as:  CIPRO  Take 1 tablet (250 mg total) by mouth 2 (two) times daily.     l-methylfolate-b2-b6-b12 01-31-49-5 MG Tabs  Commonly known as:  CEREFOLIN  Take 1 tablet by mouth daily.     LORazepam 1 MG tablet  Commonly known as:  ATIVAN  Take 0.5 mg by mouth 2 (two) times daily.     memantine 10 MG tablet  Commonly known as:  NAMENDA  Take 10 mg by mouth 2 (two)  times daily.     MH MACULAR HEALTH PO  Take 1 capsule by mouth 2 (two) times daily.     rosuvastatin 5 MG tablet  Commonly known as:  CRESTOR  Take 5 mg by mouth daily.       Allergies  Allergen Reactions  . Penicillins Swelling       Follow-up Information   Please follow up. (need follow up with PCP in 1 week)        The results of significant diagnostics from this hospitalization (including imaging, microbiology, ancillary and laboratory) are listed below for reference.    Significant Diagnostic Studies: Ct Head Wo Contrast  05/23/2013   CLINICAL DATA:  Altered mental status  EXAM: CT HEAD WITHOUT CONTRAST  TECHNIQUE: Contiguous axial images were obtained from the base of the skull through the vertex without intravenous contrast. Study was obtained within 24 hr of patient's arrival at the emergency department  COMPARISON:  February 06, 2009  FINDINGS: Moderate diffuse atrophy is stable. There is no mass, hemorrhage, extra-axial fluid collection, or midline shift. There is patchy small vessel disease in the centra semiovale bilaterally. There is a prior infarct at the gray-white junction of the anterior right temporal lobe at the level of the superior insula on the right the with some involvement of the right external and extreme capsules on the right anteriorly. There is evidence of a prior small infarct in the anterior limb of the left external capsule. There is evidence of a prior tiny infarct in the left thalamus. No acute appearing infarct is seen.  Bony calvarium appears intact. Mastoid air cells are clear. There is mild mucosal thickening in several ethmoid air cells bilaterally. There is a small benign appearing enostosis in the right frontal region without surrounding edema, stable.  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 There is no demonstrable mass, hemorrhage, or acute appearing infarct.   Electronically Signed   By: Bretta Bang   On:  05/23/2013 18:43    Microbiology: Recent Results (from the past 240 hour(s))  URINE CULTURE     Status: None   Collection Time    05/24/13  1:00 AM      Result Value Range Status   Specimen Description URINE, RANDOM   Final   Special Requests ADDED 0152   Final   Culture  Setup Time     Final   Value: 05/24/2013 02:24     Performed at [facility]   Colony Count     Final   Value: >=100,000 COLONIES/ML     Performed at [facility]   Culture     Final   Value: Multiple bacterial morphotypes present, none predominant. Suggest appropriate recollection if clinically indicated.     Performed at [facility]   Report Status 05/25/2013 FINAL   Final  Labs: Basic Metabolic Panel:  Recent Labs Lab 05/23/13 1821 05/24/13 0200 05/25/13 0425  NA 142 141 140  K 3.8 3.3* 4.4  CL 105 105 109  CO2 28 26 21   GLUCOSE 97 84 95  BUN 17 20 17   CREATININE 1.53* 1.58* 1.30*  CALCIUM 9.6 9.2 9.0   Liver Function Tests: No results found for this basename: AST, ALT, ALKPHOS, BILITOT, PROT, ALBUMIN,  in the last 168 hours No results found for this basename: LIPASE, AMYLASE,  in the last 168 hours No results found for this basename: AMMONIA,  in the last 168 hours CBC:  Recent Labs Lab 05/23/13 1821 05/24/13 0200  WBC 9.3 10.0  NEUTROABS 7.4  --   HGB 12.2 11.4*  HCT 36.1 33.2*  MCV 82.0 81.4  PLT 210 198   Cardiac Enzymes:  Recent Labs Lab 05/24/13 0200 05/24/13 0820 05/24/13 1520  TROPONINI <0.30 <0.30 <0.30   BNP: BNP (last 3 results) No results found for this basename: PROBNP,  in the last 8760 hours CBG: No results found for this basename: GLUCAP,  in the last 168 hours     Signed:  Logan Baltimore  [facility] 05/25/2013, 8:42 AM

## 2013-05-25 NOTE — Progress Notes (Signed)
Patient's daughter given discharge instructions and denies any questions or concerns.  Patient alert and arousable.  Rx given to patient's daughter.  IV access removed, pt tolerated removal well; site clean, dry and intact.  Pt to be discharged home with daughter and granddaughter.

## 2013-05-25 NOTE — Progress Notes (Signed)
Paged ECHO dept regarding results and informed to call [employer] to determine who is reading [facility] today.  Called [employer] and informed to call [place], [place] who will inform Dr. Salena Saner to read ECHO as soon as possible.

## 2013-05-25 NOTE — Progress Notes (Signed)
*  PRELIMINARY RESULTS* Echocardiogram 2D Echocardiogram has been performed.  Ariel Douglas 05/25/2013, 10:00 AM

## 2013-05-25 NOTE — Evaluation (Signed)
Occupational Therapy Evaluation and Discharge Summary Patient Details Name: Ariel Douglas MRN: [id number] DOB: 05/19/1919 Today's Date: 05/25/2013 Time: 1010-1026 OT Time Calculation (min): 16 min  OT Assessment / Plan / Recommendation History of present illness Ariel Douglas is a 79 y.o. female who presents to the ED with weakness over the past couple of days.  Patient did have abdominal pain last night that woke her up but this completely resolved with peptobismol.  Today she had a syncopal episode while using the bathroom and was slumped against the counter.  LOC lasted about 1 min, and she was confused after the episode still some 10 min later.  Patient is currently back to normal mentation.  No injury or pain due to trauma from syncopal episode, no chest pain related to syncopal episode   Clinical Impression   Pt admitted with above diagnosis and seems to have returned to baseline.  No further OT needs at this time.    OT Assessment  Patient does not need any further OT services    Follow Up Recommendations  No OT follow up;Supervision/Assistance - 24 hour    Barriers to Discharge      Equipment Recommendations  None recommended by OT    Recommendations for Other Services    Frequency       Precautions / Restrictions Precautions Precautions: Fall Precaution Comments: Pt needs 24/7 S for her cognitive status.  Pt with very poor memory and awareness. Restrictions Weight Bearing Restrictions: No   Pertinent Vitals/Pain Pt did not c/o pain.    ADL  Eating/Feeding: Performed;Set up Where Assessed - Eating/Feeding: Chair Grooming: Performed;Wash/dry hands;Teeth care;Denture care;Supervision/safety Where Assessed - Grooming: Supported standing Upper Body Bathing: Simulated;Set up Where Assessed - Upper Body Bathing: Supported sitting Lower Body Bathing: Simulated;Supervision/safety Where Assessed - Lower Body Bathing: Supported sit to stand Upper Body Dressing: Performed;Set  up Where Assessed - Upper Body Dressing: Unsupported sitting Lower Body Dressing: Performed;Supervision/safety Where Assessed - Lower Body Dressing: Supported sit to stand Toilet Transfer: Research scientist (life sciences) Method: Other (comment) (ambulated with HHA to bathroom) Toilet Transfer Equipment: Comfort height toilet;Grab bars Toileting - Clothing Manipulation and Hygiene: Performed;Supervision/safety Where Assessed - Toileting Clothing Manipulation and Hygiene: Standing Transfers/Ambulation Related to ADLs: Pt walked in room with S.  Pt required S for safety with adls. ADL Comments: Pt does well with.  Pt needs supervision for safety issues, decreased memory and to stay on task.  Daughter provides this at home.    OT Diagnosis:    OT Problem List:   OT Treatment Interventions:     OT Goals(Current goals can be found in the care plan section) Acute Rehab OT Goals Patient Stated Goal: "return home by myself"  Visit Information  Last OT Received On: 05/25/13 Assistance Needed: +1 History of Present Illness: Ariel Douglas is a 79 y.o. female who presents to the ED with weakness over the past couple of days.  Patient did have abdominal pain last night that woke her up but this completely resolved with peptobismol.  Today she had a syncopal episode while using the bathroom and was slumped against the counter.  LOC lasted about 1 min, and she was confused after the episode still some 10 min later.  Patient is currently back to normal mentation.  No injury or pain due to trauma from syncopal episode, no chest pain related to syncopal episode       Prior Functioning     Home Living Family/patient expects to be discharged to:: Private  residence Living Arrangements: Children Available Help at Discharge: Family;Available 24 hours/day Type of Home: House Home Access: Stairs to enter [employer] of Steps: 2 Home Layout: Two level;Bed/bath upstairs Alternate  Level Stairs-Number of Steps: 12 Alternate Level Stairs-Rails: Right Home Equipment: Walker - 2 wheels Prior Function Level of Independence: Needs assistance Gait / Transfers Assistance Needed: independent for mobility with occasional furniture walking ADL's / Homemaking Assistance Needed: dgtr does all cooking and houswork, pt straightens her room and dgtr supervises her into the bathtub but pt bathes herself Communication Communication: No difficulties Dominant Hand: Right         Vision/Perception Vision - History Baseline Vision: No visual deficits Vision - Assessment Vision Assessment: Vision not tested   Copywriter, advertising Behavior During Therapy: WFL for tasks assessed/performed Overall Cognitive Status: History of cognitive impairments - at baseline Memory: Decreased short-term memory    Extremity/Trunk Assessment Upper Extremity Assessment Upper Extremity Assessment: Overall WFL for tasks assessed Lower Extremity Assessment Lower Extremity Assessment: Defer to PT evaluation Cervical / Trunk Assessment Cervical / Trunk Assessment: Normal     Mobility Bed Mobility Bed Mobility: Supine to Sit;Sitting - Scoot to Edge of Bed Supine to Sit: 5: Supervision Sitting - Scoot to Edge of Bed: 5: Supervision Transfers Transfers: Sit to Stand;Stand to Sit Sit to Stand: 5: Supervision;From chair/3-in-1;From toilet Stand to Sit: 5: Supervision;To toilet;To chair/3-in-1 Details for Transfer Assistance: cueing for hand placement     Exercise     Balance Balance Balance Assessed: Yes Dynamic Standing Balance Dynamic Standing - Balance Support: Bilateral upper extremity supported;During functional activity Dynamic Standing - Level of Assistance: 5: Stand by assistance   End of Session OT - End of Session Activity Tolerance: Patient tolerated treatment well Patient left: in chair;with call bell/phone within reach;with chair alarm set Nurse Communication: Mobility status   GO     Hope Budds 05/25/2013, 10:33 AM [phone]

## 2013-05-26 NOTE — ED Provider Notes (Signed)
Medical screening examination/treatment/procedure(s) were conducted as a shared visit with non-physician practitioner(s) and myself.  I personally evaluated the patient during the encounter  PAtient with unexplained syncope prior to arrival. Patient reports being weakened floor the last several days, but no other specific symptoms. Workup has been unremarkable, but does not explain the syncope. Will require hospitalization for further management.  Gilda Crease, MD 05/26/13 [phone]

## 2013-06-04 ENCOUNTER — Other Ambulatory Visit: Payer: Self-pay | Admitting: Internal Medicine

## 2013-06-04 DIAGNOSIS — N189 Chronic kidney disease, unspecified: Secondary | ICD-10-CM

## 2013-06-08 ENCOUNTER — Other Ambulatory Visit: Payer: Medicare Other

## 2015-05-21 ENCOUNTER — Inpatient Hospital Stay (HOSPITAL_COMMUNITY)
Admission: EM | Admit: 2015-05-21 | Discharge: 2015-05-24 | DRG: 066 | Disposition: A | Discharge status: Skilled Nursing Facility | Payer: Medicare Other | Attending: Internal Medicine | Admitting: Internal Medicine

## 2015-05-21 ENCOUNTER — Encounter (HOSPITAL_COMMUNITY): Payer: Self-pay | Admitting: Nurse Practitioner

## 2015-05-21 ENCOUNTER — Emergency Department (HOSPITAL_COMMUNITY): Payer: Medicare Other

## 2015-05-21 DIAGNOSIS — Z8673 Personal history of transient ischemic attack (TIA), and cerebral infarction without residual deficits: Secondary | ICD-10-CM

## 2015-05-21 DIAGNOSIS — I63312 Cerebral infarction due to thrombosis of left middle cerebral artery: Secondary | ICD-10-CM | POA: Diagnosis not present

## 2015-05-21 DIAGNOSIS — I10 Essential (primary) hypertension: Secondary | ICD-10-CM | POA: Diagnosis not present

## 2015-05-21 DIAGNOSIS — R011 Cardiac murmur, unspecified: Secondary | ICD-10-CM | POA: Diagnosis not present

## 2015-05-21 DIAGNOSIS — R488 Other symbolic dysfunctions: Secondary | ICD-10-CM | POA: Diagnosis not present

## 2015-05-21 DIAGNOSIS — Z87891 Personal history of nicotine dependence: Secondary | ICD-10-CM | POA: Diagnosis not present

## 2015-05-21 DIAGNOSIS — G94 Other disorders of brain in diseases classified elsewhere: Secondary | ICD-10-CM | POA: Diagnosis not present

## 2015-05-21 DIAGNOSIS — R1310 Dysphagia, unspecified: Secondary | ICD-10-CM | POA: Diagnosis not present

## 2015-05-21 DIAGNOSIS — I6789 Other cerebrovascular disease: Secondary | ICD-10-CM | POA: Diagnosis not present

## 2015-05-21 DIAGNOSIS — I6300 Cerebral infarction due to thrombosis of unspecified precerebral artery: Secondary | ICD-10-CM | POA: Diagnosis not present

## 2015-05-21 DIAGNOSIS — I2510 Atherosclerotic heart disease of native coronary artery without angina pectoris: Secondary | ICD-10-CM | POA: Diagnosis not present

## 2015-05-21 DIAGNOSIS — I63512 Cerebral infarction due to unspecified occlusion or stenosis of left middle cerebral artery: Principal | ICD-10-CM | POA: Diagnosis present

## 2015-05-21 DIAGNOSIS — R4701 Aphasia: Secondary | ICD-10-CM | POA: Diagnosis present

## 2015-05-21 DIAGNOSIS — Z79899 Other long term (current) drug therapy: Secondary | ICD-10-CM

## 2015-05-21 DIAGNOSIS — N183 Chronic kidney disease, stage 3 unspecified: Secondary | ICD-10-CM

## 2015-05-21 DIAGNOSIS — R1312 Dysphagia, oropharyngeal phase: Secondary | ICD-10-CM | POA: Diagnosis not present

## 2015-05-21 DIAGNOSIS — I639 Cerebral infarction, unspecified: Secondary | ICD-10-CM

## 2015-05-21 DIAGNOSIS — I69391 Dysphagia following cerebral infarction: Secondary | ICD-10-CM | POA: Diagnosis not present

## 2015-05-21 DIAGNOSIS — E785 Hyperlipidemia, unspecified: Secondary | ICD-10-CM | POA: Diagnosis not present

## 2015-05-21 DIAGNOSIS — F0390 Unspecified dementia without behavioral disturbance: Secondary | ICD-10-CM | POA: Diagnosis not present

## 2015-05-21 DIAGNOSIS — Z7982 Long term (current) use of aspirin: Secondary | ICD-10-CM | POA: Diagnosis not present

## 2015-05-21 DIAGNOSIS — Z880 Allergy status to penicillin: Secondary | ICD-10-CM

## 2015-05-21 DIAGNOSIS — I129 Hypertensive chronic kidney disease with stage 1 through stage 4 chronic kidney disease, or unspecified chronic kidney disease: Secondary | ICD-10-CM | POA: Diagnosis not present

## 2015-05-21 DIAGNOSIS — R2681 Unsteadiness on feet: Secondary | ICD-10-CM | POA: Diagnosis not present

## 2015-05-21 DIAGNOSIS — M6281 Muscle weakness (generalized): Secondary | ICD-10-CM | POA: Diagnosis not present

## 2015-05-21 DIAGNOSIS — I69320 Aphasia following cerebral infarction: Secondary | ICD-10-CM | POA: Diagnosis not present

## 2015-05-21 HISTORY — DX: Personal history of transient ischemic attack (TIA), and cerebral infarction without residual deficits: Z86.73

## 2015-05-21 HISTORY — DX: Chronic kidney disease, stage 3 unspecified: N18.30

## 2015-05-21 HISTORY — DX: Transient cerebral ischemic attack, unspecified: G45.9

## 2015-05-21 LAB — CBG MONITORING, ED: GLUCOSE-CAPILLARY: 67 mg/dL (ref 65–99)

## 2015-05-21 LAB — CBC
HEMATOCRIT: 38.3 % (ref 36.0–46.0)
Hemoglobin: 12.4 g/dL (ref 12.0–15.0)
MCH: 27 pg (ref 26.0–34.0)
MCHC: 32.4 g/dL (ref 30.0–36.0)
MCV: 83.4 fL (ref 78.0–100.0)
PLATELETS: 239 10*3/uL (ref 150–400)
RBC: 4.59 MIL/uL (ref 3.87–5.11)
RDW: 14.7 % (ref 11.5–15.5)
WBC: 5.1 10*3/uL (ref 4.0–10.5)

## 2015-05-21 LAB — COMPREHENSIVE METABOLIC PANEL
ALT: 12 U/L — ABNORMAL LOW (ref 14–54)
AST: 23 U/L (ref 15–41)
Albumin: 3.7 g/dL (ref 3.5–5.0)
Alkaline Phosphatase: 58 U/L (ref 38–126)
Anion gap: 11 (ref 5–15)
BUN: 16 mg/dL (ref 6–20)
CHLORIDE: 107 mmol/L (ref 101–111)
CO2: 22 mmol/L (ref 22–32)
Calcium: 9.9 mg/dL (ref 8.9–10.3)
Creatinine, Ser: 1.55 mg/dL — ABNORMAL HIGH (ref 0.44–1.00)
GFR, EST AFRICAN AMERICAN: 32 mL/min — AB (ref >60)
GFR, EST NON AFRICAN AMERICAN: 27 mL/min — AB (ref >60)
Glucose, Bld: 96 mg/dL (ref 65–99)
POTASSIUM: 3.8 mmol/L (ref 3.5–5.1)
SODIUM: 140 mmol/L (ref 135–145)
Total Bilirubin: 0.6 mg/dL (ref 0.3–1.2)
Total Protein: 7.3 g/dL (ref 6.5–8.1)

## 2015-05-21 LAB — DIFFERENTIAL
BASOS ABS: 0 10*3/uL (ref 0.0–0.1)
BASOS PCT: 0 %
EOS ABS: 0.1 10*3/uL (ref 0.0–0.7)
Eosinophils Relative: 3 %
Lymphocytes Relative: 32 %
Lymphs Abs: 1.6 10*3/uL (ref 0.7–4.0)
MONO ABS: 0.4 10*3/uL (ref 0.1–1.0)
MONOS PCT: 8 %
NEUTROS ABS: 2.9 10*3/uL (ref 1.7–7.7)
Neutrophils Relative %: 57 %

## 2015-05-21 LAB — I-STAT TROPONIN, ED: TROPONIN I, POC: 0.01 ng/mL (ref 0.00–0.08)

## 2015-05-21 LAB — I-STAT CHEM 8, ED
BUN: 20 mg/dL (ref 6–20)
CHLORIDE: 107 mmol/L (ref 101–111)
Calcium, Ion: 1.28 mmol/L (ref 1.13–1.30)
Creatinine, Ser: 1.6 mg/dL — ABNORMAL HIGH (ref 0.44–1.00)
Glucose, Bld: 93 mg/dL (ref 65–99)
HEMATOCRIT: 41 % (ref 36.0–46.0)
HEMOGLOBIN: 13.9 g/dL (ref 12.0–15.0)
POTASSIUM: 3.9 mmol/L (ref 3.5–5.1)
Sodium: 143 mmol/L (ref 135–145)
TCO2: 25 mmol/L (ref 0–100)

## 2015-05-21 LAB — PROTIME-INR
INR: 1.11 (ref 0.00–1.49)
PROTHROMBIN TIME: 14.5 s (ref 11.6–15.2)

## 2015-05-21 LAB — APTT: APTT: 23 s — AB (ref 24–37)

## 2015-05-21 MED ORDER — STROKE: EARLY STAGES OF RECOVERY BOOK
Freq: Once | Status: DC
  Filled 2015-05-21: qty 1

## 2015-05-21 MED ORDER — ENOXAPARIN SODIUM 40 MG/0.4ML ~~LOC~~ SOLN
40.0000 mg | SUBCUTANEOUS | Status: DC
  Administered 2015-05-21 – 2015-05-23 (×3): 40 mg via SUBCUTANEOUS
  Filled 2015-05-21 (×3): qty 0.4

## 2015-05-21 MED ORDER — SODIUM CHLORIDE 0.9 % IV SOLN
Freq: Once | INTRAVENOUS | Status: AC
  Administered 2015-05-21: 17:00:00 via INTRAVENOUS

## 2015-05-21 MED ORDER — SODIUM CHLORIDE 0.9 % IV SOLN: INTRAVENOUS | Status: DC

## 2015-05-21 MED ORDER — ASPIRIN 300 MG RE SUPP
300.0000 mg | Freq: Every day | RECTAL | Status: DC
  Administered 2015-05-22: 300 mg via RECTAL
  Filled 2015-05-21: qty 1

## 2015-05-21 MED ORDER — LORAZEPAM 2 MG/ML IJ SOLN
0.2500 mg | Freq: Every day | INTRAMUSCULAR | Status: DC
  Filled 2015-05-21: qty 1

## 2015-05-21 MED ORDER — LORAZEPAM 2 MG/ML IJ SOLN
0.5000 mg | Freq: Every day | INTRAMUSCULAR | Status: DC
  Administered 2015-05-21: 0.5 mg via INTRAVENOUS
  Filled 2015-05-21: qty 1

## 2015-05-21 MED ORDER — DEXTROSE 50 % IV SOLN: 1.0000 | Freq: Once | INTRAVENOUS | Status: DC

## 2015-05-21 MED ORDER — ASPIRIN 300 MG RE SUPP
300.0000 mg | Freq: Once | RECTAL | Status: AC
  Administered 2015-05-21: 300 mg via RECTAL
  Filled 2015-05-21: qty 1

## 2015-05-21 NOTE — H&P (Signed)
[facility] History and Physical  Ariel Douglas MWN:[id number] DOB: Jan 03, 1919 DOA: 05/21/2015   PCP: [name], MD    Chief Complaint: slurred speech  HPI: Ariel Douglas is a 81 y.o. female with a history of hypertension, TIA in the past, coronary artery disease, dementia and hyperlipidemia was brought into the hospital by her daughter for slurred speech. The daughter went to church this morning. She said goodbye to her mother who nodded and said "mmm-hmm" morning. When she returned from work in the afternoon she noted that her mother had slurred speech. The patient tried to eat but according to the daughter she was not able to swallow food. She was drooling. She appeared slightly confused. In the ER she continues to have nonsensical speech. She is able to say short words like "thank you" and he "yes" but unable to speak long sentences. CT of the head shows acute ischemia in the left MCA territory   Review of systems: According to the daughter the patient has had a poor appetite and has lost about 9 pounds in the past 3 months. She is often quite forgetful and she feels that her dementia has worsened over the past 6 months. Other review of systems unobtainable as the patient is unable to communicate   Past Medical History  Diagnosis Date  . Hypertension   . Coronary artery disease   . Dementia   . Hyperlipidemia   . Transient ischemic attack (TIA)     History reviewed. No pertinent past surgical history.  Social History: does not smoke or drink alcohol Lives at home with daughter- she is able to ambulate and take care of herself for the most part   Allergies  Allergen Reactions  . Penicillins Swelling    Facial and hand swelling per daughter Has patient had a PCN reaction causing immediate rash, facial/tongue/throat swelling, SOB or lightheadedness with hypotension: Yes Has patient had a PCN reaction causing severe rash involving mucus membranes or skin necrosis:  No Has patient had a PCN reaction that required hospitalization No Has patient had a PCN reaction occurring within the last 10 years: No If all of the above answers are "NO", then may proceed with Cephalosporin use.    Family history:  History reviewed. No pertinent family history.    Prior to Admission medications   Medication Sig Start Date End Date Taking? Authorizing Ariel Douglas  aspirin (ASPIRIN EC) 81 MG EC tablet Take 81 mg by mouth daily. Swallow whole.   Yes Historical Shantara Goosby, MD  atenolol (TENORMIN) 25 MG tablet Take 12.5 mg by mouth daily.   Yes Historical Nylan Nakatani, MD  beta carotene w/minerals (OCUVITE) tablet Take 1 tablet by mouth daily.   Yes Historical Michaela Shankel, MD  bismuth subsalicylate (PEPTO BISMOL) 262 MG/15ML suspension Take 30 mLs by mouth daily as needed for indigestion.    Yes Historical Celise Bazar, MD  Calcium Citrate-Vitamin D (CALCIUM CITRATE + D3 PO) Take 1 tablet by mouth 2 (two) times daily. Per tablet: calcium citrate 200 mg, vitamin D3 250 i.u.   Yes Historical Philo Kurtz, MD  cholecalciferol (VITAMIN D) 1000 UNITS tablet Take 1,000 Units by mouth 2 (two) times daily.   Yes Historical Anuhea Gassner, MD  l-methylfolate-b2-b6-b12 (CEREFOLIN) 01-31-49-5 MG TABS Take 1 tablet by mouth daily.   Yes Historical Alroy Portela, MD  LORazepam (ATIVAN) 1 MG tablet Take 0.5-1 mg by mouth 2 (two) times daily. Take 1/2 tablet (0.5 mg) by mouth every morning and 1 tablet (1 mg) at bedtime  Yes Historical Ellyce Lafevers, MD  memantine (NAMENDA) 10 MG tablet Take 10 mg by mouth 2 (two) times daily.   Yes Historical Rashan Rounsaville, MD  Omega-3 Fatty Acids (FISH OIL) 1200 MG CAPS Take 1,200 mg by mouth daily.   Yes Historical Danelle Curiale, MD     Physical Exam: Filed Vitals:   05/21/15 1615 05/21/15 1700 05/21/15 1715 05/21/15 1745  BP: 164/91 155/83 169/80 153/95  Pulse: 84 81 85 89  Temp:      TempSrc:      Resp: SpO2: 99% 98% 98% 97%     General: elderly female laying in bed in no  acute distress-restless and fidgety HEENT: Normocephalic and Atraumatic, Mucous membranes pink                PERRLA; EOM intact; No scleral icterus,                 Nares: Patent, Oropharynx: Clear, Fair Dentition                 Neck: FROM, no cervical lymphadenopathy, thyromegaly, carotid bruit or JVD;  Breasts: deferred CHEST WALL: No tenderness  CHEST: Normal respiration, clear to auscultation bilaterally  HEART: Regular rate and rhythm; no murmurs rubs or gallops  BACK: No kyphosis or scoliosis; no CVA tenderness  GI: Positive Bowel Sounds, soft, non-tender; no masses, no organomegaly Rectal Exam: deferred MSK: No cyanosis, clubbing, or edema Genitalia: not examined  SKIN:  no rash or ulceration  CNS: Alert, speech does not make any sense (expressive aphasia), able to follow commands, cranial nerves II through XII intact, good strength in all 4 extremities  Labs on Admission:  Basic Metabolic Panel:  Recent Labs Lab 05/21/15 1507 05/21/15 1539  NA 140 143  K 3.8 3.9  CL 107 107  CO2 22  --   GLUCOSE 96 93  BUN 16 20  CREATININE 1.55* 1.60*  CALCIUM 9.9  --    Liver Function Tests:  Recent Labs Lab 05/21/15 1507  AST 23  ALT 12*  ALKPHOS 58  BILITOT 0.6  PROT 7.3  ALBUMIN 3.7   No results for input(s): LIPASE, AMYLASE in the last 168 hours. No results for input(s): AMMONIA in the last 168 hours. CBC:  Recent Labs Lab 05/21/15 1507 05/21/15 1539  WBC 5.1  --   NEUTROABS 2.9  --   HGB 12.4 13.9  HCT 38.3 41.0  MCV 83.4  --   PLT 239  --    Cardiac Enzymes: No results for input(s): CKTOTAL, CKMB, CKMBINDEX, TROPONINI in the last 168 hours.  BNP (last 3 results) No results for input(s): BNP in the last 8760 hours.  ProBNP (last 3 results) No results for input(s): PROBNP in the last 8760 hours.  CBG:  Recent Labs Lab 05/21/15 1509  GLUCAP 67    Radiological Exams on Admission: Ct Head Wo Contrast  05/21/2015   CLINICAL DATA:  81 year old  female with a history of acute speech disorder.  EXAM: CT HEAD WITHOUT CONTRAST  TECHNIQUE: Contiguous axial images were obtained from the base of the skull through the vertex without intravenous contrast.  COMPARISON:  02/06/2009, 05/23/2013  FINDINGS: Loss of gray-white differentiation of the left anterior insula and within the left frontal low.  No acute intracranial hemorrhage.  No midline shift or mass effect.  Hypodensity of the right frontal region is new from the comparison CT, though appears completed/remote.  Hypodense focus of the right cerebellar hemisphere, new  from the comparison CT.  Unremarkable configuration of the ventricles.  Intracranial atherosclerosis.  Unremarkable appearance of the calvarium without acute fracture or aggressive lesion.  Unremarkable appearance of the scalp soft tissues.  Unremarkable appearance of the bilateral orbits.  .  Mastoid air cells are clear.  No significant paranasal sinus disease  IMPRESSION: Acute ischemia of the left anterior insula and left frontal lobe in the territory of the left MCA. Aspects score of 7.  Encephalomalacia with completed infarction of the right frontal lobe, new from the CT dated 05/23/2013. There is also a new focal infarction of the right cerebellar hemisphere, age indeterminate.  No evidence of intracranial hemorrhage.  Intracranial atherosclerosis.  These results were called by telephone at the time of interpretation on 05/21/2015 at 3:54 pm to Dr. Blane Ohara , who verbally acknowledged these results.  Signed,  Yvone Neu. Loreta Ave, DO  [facility]  [facility]   Electronically Signed   By: Gilmer Mor D.O.   On: 05/21/2015 15:56    EKG: Independently reviewed. Sinus rhythm at 85 bpm  Assessment/Plan Principal Problem:   CVA (cerebral infarction) - Left MCA territory with expressive aphasia and possible dysphagia - Rectal aspirin -Nothing by mouth-IV fluids while nothing by  mouth -Speech, physical therapy and occupational therapy eval- -MRI/MRA, carotid Dopplers, 2-D echo, lipid panel and A1c    Active Problems:   Chronic kidney disease, stage 3 -Stable    Dementia -Hold Namenda for now -She appears quite restless-will order sitter at bedside -Takes Ativan twice a day-will change this to IV and cut the dose in half    Hypertension -Hold atenolol and allow for permissive hypertension    Consulted: Is been seen by neurology in the ER  Code Status:   no intubation  Family Communication: daughter- shirley oliver-taylor  DVT Prophylaxis:Lovenox Time spent: 55 min RIZWAN,SAIMA, MD [facility]  If 7PM-7AM, please contact night-coverage [email or web address] 05/21/2015, 6:11 PM

## 2015-05-21 NOTE — ED Notes (Addendum)
Daughter saw the pt normal last night at 10 pm. She went back to check on her this morning and she found her sitting in her chair unable to stand, drooling, with garbled speech. Daughter reports 9 lb weight loss and decreased appetite over past month. She has a history of dementia but can normally communicate with family. She is alert, breathing easily, attempts to follow commands, MAE, speech incomprehensible, drooling now

## 2015-05-21 NOTE — ED Provider Notes (Signed)
CSN: [id number]     Arrival date & time 05/21/15  1449 History   First MD Initiated Contact with Patient 05/21/15 1521     Chief Complaint  Patient presents with  . Dysphagia   Patient is a 81 y.o. female presenting with general illness. The history is provided by a relative and medical records. The history is limited by the condition of the patient. No language interpreter was used.  Illness Location:  NA Quality:  Dysphasia Severity:  Moderate Onset quality:  Gradual Timing:  Unable to specify Progression:  Unable to specify Chronicity:  New Context:  PMHx of HTN, HLD, TIA, and dementia presenting via daughter with dysphasia. Lives at home with daughter. Decreased appetite and 9 pound weight loss over past couple months. More forgetful over past x2-3 weeks. Last seen normal by daughter yesterday evening. Daughter left for church at 9:15am and patient less talkative than normal. Returned at noon and attempted to have a conversation with patient but noted slurred speech   Past Medical History  Diagnosis Date  . Hypertension   . Coronary artery disease   . Dementia   . Hyperlipidemia   . Transient ischemic attack (TIA)    History reviewed. No pertinent past surgical history. History reviewed. No pertinent family history. Social History  Substance Use Topics  . Smoking status: Former Games developer  . Smokeless tobacco: None  . Alcohol Use: No   OB History    No data available      Review of Systems  Constitutional: Positive for appetite change and unexpected weight change.  Neurological: Positive for speech difficulty.  All other systems reviewed and are negative.   Allergies  Penicillins  Home Medications   Prior to Admission medications   Medication Sig Start Date End Date Taking? Authorizing Provider  aspirin (ASPIRIN EC) 81 MG EC tablet Take 81 mg by mouth daily. Swallow whole.   Yes Historical Provider, MD  atenolol (TENORMIN) 25 MG tablet Take 12.5 mg by mouth daily.    Yes Historical Provider, MD  beta carotene w/minerals (OCUVITE) tablet Take 1 tablet by mouth daily.   Yes Historical Provider, MD  bismuth subsalicylate (PEPTO BISMOL) 262 MG/15ML suspension Take 30 mLs by mouth daily as needed for indigestion.    Yes Historical Provider, MD  Calcium Citrate-Vitamin D (CALCIUM CITRATE + D3 PO) Take 1 tablet by mouth 2 (two) times daily. Per tablet: calcium citrate 200 mg, vitamin D3 250 i.u.   Yes Historical Provider, MD  cholecalciferol (VITAMIN D) 1000 UNITS tablet Take 1,000 Units by mouth 2 (two) times daily.   Yes Historical Provider, MD  l-methylfolate-b2-b6-b12 (CEREFOLIN) 01-31-49-5 MG TABS Take 1 tablet by mouth daily.   Yes Historical Provider, MD  LORazepam (ATIVAN) 1 MG tablet Take 0.5-1 mg by mouth 2 (two) times daily. Take 1/2 tablet (0.5 mg) by mouth every morning and 1 tablet (1 mg) at bedtime   Yes Historical Provider, MD  memantine (NAMENDA) 10 MG tablet Take 10 mg by mouth 2 (two) times daily.   Yes Historical Provider, MD  Omega-3 Fatty Acids (FISH OIL) 1200 MG CAPS Take 1,200 mg by mouth daily.   Yes Historical Provider, MD   BP 169/83 mmHg  Pulse 93  Temp(Src) 98.4 F (36.9 C) (Oral)  Resp 18  Wt 123 lb 0.3 oz (55.8 kg)  SpO2 99%   Physical Exam  Constitutional: No distress.  HENT:  Head: Normocephalic and atraumatic.  Eyes: Conjunctivae are normal. Pupils are equal, round, and  reactive to light.  Neck: Normal range of motion. Neck supple.  Cardiovascular: Normal rate, regular rhythm and intact distal pulses.   Pulmonary/Chest: Effort normal and breath sounds normal.  Abdominal: Soft. Bowel sounds are normal. She exhibits no distension. There is no tenderness.  Musculoskeletal: Normal range of motion.  Neurological: She is alert.  Alert, opening eyes spontaneously, PERRLA, right facial droop, moving all extremities, speech unintelligible, patient unable to follow verbal commands but able to assess strength via demonstration of  commands, 5/5 strength throughout  Skin: Skin is warm and dry. She is not diaphoretic.  Nursing note and vitals reviewed.   ED Course  Procedures   Labs Review Labs Reviewed  APTT - Abnormal; Notable for the following:    aPTT 23 (*)    All other components within normal limits  COMPREHENSIVE METABOLIC PANEL - Abnormal; Notable for the following:    Creatinine, Ser 1.55 (*)    ALT 12 (*)    GFR calc non Af Amer 27 (*)    GFR calc Af Amer 32 (*)    All other components within normal limits  I-STAT CHEM 8, ED - Abnormal; Notable for the following:    Creatinine, Ser 1.60 (*)    All other components within normal limits  PROTIME-INR  CBC  DIFFERENTIAL  HEMOGLOBIN A1C  LIPID PANEL  I-STAT TROPOININ, ED  CBG MONITORING, ED   Imaging Review Ct Head Wo Contrast  05/21/2015   CLINICAL DATA:  81 year old female with a history of acute speech disorder.  EXAM: CT HEAD WITHOUT CONTRAST  TECHNIQUE: Contiguous axial images were obtained from the base of the skull through the vertex without intravenous contrast.  COMPARISON:  02/06/2009, 05/23/2013  FINDINGS: Loss of gray-white differentiation of the left anterior insula and within the left frontal low.  No acute intracranial hemorrhage.  No midline shift or mass effect.  Hypodensity of the right frontal region is new from the comparison CT, though appears completed/remote.  Hypodense focus of the right cerebellar hemisphere, new from the comparison CT.  Unremarkable configuration of the ventricles.  Intracranial atherosclerosis.  Unremarkable appearance of the calvarium without acute fracture or aggressive lesion.  Unremarkable appearance of the scalp soft tissues.  Unremarkable appearance of the bilateral orbits.  .  Mastoid air cells are clear.  No significant paranasal sinus disease  IMPRESSION: Acute ischemia of the left anterior insula and left frontal lobe in the territory of the left MCA. Aspects score of 7.  Encephalomalacia with completed  infarction of the right frontal lobe, new from the CT dated 05/23/2013. There is also a new focal infarction of the right cerebellar hemisphere, age indeterminate.  No evidence of intracranial hemorrhage.  Intracranial atherosclerosis.  These results were called by telephone at the time of interpretation on 05/21/2015 at 3:54 pm to Dr. Blane Ohara , who verbally acknowledged these results.  Signed,  Yvone Neu. Loreta Ave, DO  [facility]  [facility]   Electronically Signed   By: Gilmer Mor D.O.   On: 05/21/2015 15:56   I have personally reviewed and evaluated these images and lab results as part of my medical decision-making.   EKG Interpretation None      MDM  Ms. Katrinka Blazing is a 81 yo female w/ PMHx of HTN, HLD, TIA, and dementia presenting via daughter with dysphasia. Lives at home with daughter. Decreased appetite and 9 pound weight loss over past couple months. More forgetful over past x2-3 weeks. Last seen normal  by daughter yesterday evening. Daughter left for church at 9:15am and patient less talkative than normal. Returned at noon and attempted to have a conversation with patient but noted slurred speech.  Exam above notable for elderly pleasant frail female lying in stretcher in no acute distress. Afebrile. Heart rate 80s. Mildly hypertensive with SBP's in 160s. Maintaining oxygen saturations on room air. Alert, opening eyes spontaneously, PERRLA, right facial droop, moving all extremities, speech unintelligible, patient unable to follow verbal commands but able to assess strength via demonstration of commands, 5/5 strength throughout  Point of care glucose 67. Ampule of dextrose given. CBC & CMP unremarkable. EKG showing no ST elevation/depression or T-wave changes. I-STAT troponin 0.01. PT 14.5. INR 1.11. CT head without contrast obtained and showing infarct on left-sided MCA distribution.  Neurology consult and evaluated the patient in the  emergency department with recommendations to admit patient for further CVA workup. Patient admitted to hospital service for above.  Pt care discussed with and followed by my attending, Dr. Kandis Nab, MD Pager [phone]   Final diagnoses:  Acute CVA (cerebrovascular accident)    Angelina Ok, MD 05/22/15 9604  Blane Ohara, MD 05/25/15 1255

## 2015-05-21 NOTE — Consult Note (Signed)
Admission H&P    Chief Complaint: New onset speech difficulty, right facial droop and drooling. Marland Kitchen HPI: Ariel Douglas is an 81 y.o. female with a history of hypertension, hyperlipidemia, coronary artery disease, TIA and dementia, presenting with new onset speech abnormality as well as right facial droop and drooling. As seen well at 10 PM last night. She was noted this morning to have episodes in speech as well as a facial droop and drooling. Weakness has been noted. Patient can understand what is being said to her that her speech output has become nonsensical. She's been unable to swallow as well. CT scan of her head showed acute left sellar and frontal lobe MCA territory ischemic infarction. Old right frontal infarction was also noted. A she has been taking aspirin daily.  LSN: 10 PM on 05/20/2015 tPA Given: No: Beyond time under for treatment consideration mRankin:  Past Medical History  Diagnosis Date  . Hypertension   . Coronary artery disease   . Dementia   . Hyperlipidemia   . Transient ischemic attack (TIA)     History reviewed. No pertinent past surgical history.  History reviewed. No pertinent family history. Social History:  reports that she has quit smoking. She does not have any smokeless tobacco history on file. She reports that she does not drink alcohol or use illicit drugs.  Allergies:  Allergies  Allergen Reactions  . Penicillins Swelling    Facial and hand swelling per daughter Has patient had a PCN reaction causing immediate rash, facial/tongue/throat swelling, SOB or lightheadedness with hypotension: Yes Has patient had a PCN reaction causing severe rash involving mucus membranes or skin necrosis: No Has patient had a PCN reaction that required hospitalization No Has patient had a PCN reaction occurring within the last 10 years: No If all of the above answers are "NO", then may proceed with Cephalosporin use.    Medications: Patient's preadmission medications  were reviewed by me.  ROS: History obtained from patient's daughter.  General ROS: 9 pound weight loss over the past 3 months with reduced appetite Psychological ROS: Mild to moderate dementia Ophthalmic ROS: negative for - blurry vision, double vision, eye pain or loss of vision ENT ROS: negative for - epistaxis, nasal discharge, oral lesions, sore throat, tinnitus or vertigo Allergy and Immunology ROS: negative for - hives or itchy/watery eyes Hematological and Lymphatic ROS: negative for - bleeding problems, bruising or swollen lymph nodes Endocrine ROS: negative for - galactorrhea, hair pattern changes, polydipsia/polyuria or temperature intolerance Respiratory ROS: negative for - cough, hemoptysis, shortness of breath or wheezing Cardiovascular ROS: negative for - chest pain, dyspnea on exertion, edema or irregular heartbeat Gastrointestinal ROS: negative for - abdominal pain, diarrhea, hematemesis, nausea/vomiting or stool incontinence Genito-Urinary ROS: negative for - dysuria, hematuria, incontinence or urinary frequency/urgency Musculoskeletal ROS: negative for - joint swelling or muscular weakness Neurological ROS: as noted in HPI Dermatological ROS: negative for rash and skin lesion changes  Physical Examination: Blood pressure 166/84, pulse 86, temperature 97.2 F (36.2 C), temperature source Oral, resp. rate 16, SpO2 100 %.  HEENT-  Normocephalic, no lesions, without obvious abnormality.  Normal external eye and conjunctiva.  Normal TM's bilaterally.  Normal auditory canals and external ears. Normal external nose, mucus membranes and septum.  Normal pharynx. Neck supple with no masses, nodes, nodules or enlargement. Cardiovascular - regularly irregular rhythm and systolic murmur: early systolic 2/6, crescendo at apex Lungs - chest clear, no wheezing, rales, normal symmetric air entry Abdomen - soft, non-tender;  bowel sounds normal; no masses,  no organomegaly Extremities -  no joint deformities, effusion, or inflammation and no edema  Neurologic Examination: Mental Status: Alert, severe expressive aphasia, no receptive aphasia. Able to follow simple commands without difficulty.  Cranial Nerves: II-Visual fields were normal. III/IV/VI-Pupils were equal and reacted. Extraocular movements were full and conjugate.    V/VII-no facial numbness; mild right lower facial weakness. VIII-normal. X-normal speech and symmetrical palatal movement. XI: trapezius strength/neck flexion strength normal bilaterally XII-midline tongue extension with normal strength. Motor: 5/5 bilaterally with normal tone and bulk Deep Tendon Reflexes: 1+ and symmetric. Plantars: Flexor bilaterally Carotid auscultation: Normal  Results for orders placed or performed during the hospital encounter of 05/21/15 (from the past 48 hour(s))  Protime-INR     Status: None   Collection Time: 05/21/15  3:07 PM  Result Value Ref Range   Prothrombin Time 14.5 11.6 - 15.2 seconds   INR 1.11 0.00 - 1.49  APTT     Status: Abnormal   Collection Time: 05/21/15  3:07 PM  Result Value Ref Range   aPTT 23 (L) 24 - 37 seconds  CBC     Status: None   Collection Time: 05/21/15  3:07 PM  Result Value Ref Range   WBC 5.1 4.0 - 10.5 K/uL   RBC 4.59 3.87 - 5.11 MIL/uL   Hemoglobin 12.4 12.0 - 15.0 g/dL   HCT 38.3 36.0 - 46.0 %   MCV 83.4 78.0 - 100.0 fL   MCH 27.0 26.0 - 34.0 pg   MCHC 32.4 30.0 - 36.0 g/dL   RDW 14.7 11.5 - 15.5 %   Platelets 239 150 - 400 K/uL  Differential     Status: None   Collection Time: 05/21/15  3:07 PM  Result Value Ref Range   Neutrophils Relative % 57 %   Neutro Abs 2.9 1.7 - 7.7 K/uL   Lymphocytes Relative 32 %   Lymphs Abs 1.6 0.7 - 4.0 K/uL   Monocytes Relative 8 %   Monocytes Absolute 0.4 0.1 - 1.0 K/uL   Eosinophils Relative 3 %   Eosinophils Absolute 0.1 0.0 - 0.7 K/uL   Basophils Relative 0 %   Basophils Absolute 0.0 0.0 - 0.1 K/uL  Comprehensive metabolic panel      Status: Abnormal   Collection Time: 05/21/15  3:07 PM  Result Value Ref Range   Sodium 140 135 - 145 mmol/L   Potassium 3.8 3.5 - 5.1 mmol/L   Chloride 107 101 - 111 mmol/L   CO2 22 22 - 32 mmol/L   Glucose, Bld 96 65 - 99 mg/dL   BUN 16 6 - 20 mg/dL   Creatinine, Ser 1.55 (H) 0.44 - 1.00 mg/dL   Calcium 9.9 8.9 - 10.3 mg/dL   Total Protein 7.3 6.5 - 8.1 g/dL   Albumin 3.7 3.5 - 5.0 g/dL   AST 23 15 - 41 U/L   ALT 12 (L) 14 - 54 U/L   Alkaline Phosphatase 58 38 - 126 U/L   Total Bilirubin 0.6 0.3 - 1.2 mg/dL   GFR calc non Af Amer 27 (L) >60 mL/min   GFR calc Af Amer 32 (L) >60 mL/min    Comment: (NOTE) The eGFR has been calculated using the CKD EPI equation. This calculation has not been validated in all clinical situations. eGFR's persistently <60 mL/min signify possible Chronic Kidney Disease.    Anion gap 11 5 - 15  CBG monitoring, ED     Status: None  Collection Time: 05/21/15  3:09 PM  Result Value Ref Range   Glucose-Capillary 67 65 - 99 mg/dL  I-stat troponin, ED (not at [facility], [facility])     Status: None   Collection Time: 05/21/15  3:27 PM  Result Value Ref Range   Troponin i, poc 0.01 0.00 - 0.08 ng/mL   Comment 3            Comment: Due to the release kinetics of cTnI, a negative result within the first hours of the onset of symptoms does not rule out myocardial infarction with certainty. If myocardial infarction is still suspected, repeat the test at appropriate intervals.   I-Stat Chem 8, ED  (not at [facility], [facility])     Status: Abnormal   Collection Time: 05/21/15  3:39 PM  Result Value Ref Range   Sodium 143 135 - 145 mmol/L   Potassium 3.9 3.5 - 5.1 mmol/L   Chloride 107 101 - 111 mmol/L   BUN 20 6 - 20 mg/dL   Creatinine, Ser 1.60 (H) 0.44 - 1.00 mg/dL   Glucose, Bld 93 65 - 99 mg/dL   Calcium, Ion 1.28 1.13 - 1.30 mmol/L   TCO2 25 0 - 100 mmol/L   Hemoglobin 13.9 12.0 - 15.0 g/dL   HCT 41.0 36.0 - 46.0 %   Ct Head Wo Contrast  05/21/2015   CLINICAL  DATA:  81 year old female with a history of acute speech disorder.  EXAM: CT HEAD WITHOUT CONTRAST  TECHNIQUE: Contiguous axial images were obtained from the base of the skull through the vertex without intravenous contrast.  COMPARISON:  02/06/2009, 05/23/2013  FINDINGS: Loss of gray-white differentiation of the left anterior insula and within the left frontal low.  No acute intracranial hemorrhage.  No midline shift or mass effect.  Hypodensity of the right frontal region is new from the comparison CT, though appears completed/remote.  Hypodense focus of the right cerebellar hemisphere, new from the comparison CT.  Unremarkable configuration of the ventricles.  Intracranial atherosclerosis.  Unremarkable appearance of the calvarium without acute fracture or aggressive lesion.  Unremarkable appearance of the scalp soft tissues.  Unremarkable appearance of the bilateral orbits.  .  Mastoid air cells are clear.  No significant paranasal sinus disease  IMPRESSION: Acute ischemia of the left anterior insula and left frontal lobe in the territory of the left MCA. Aspects score of 7.  Encephalomalacia with completed infarction of the right frontal lobe, new from the CT dated 05/23/2013. There is also a new focal infarction of the right cerebellar hemisphere, age indeterminate.  No evidence of intracranial hemorrhage.  Intracranial atherosclerosis.  These results were called by telephone at the time of interpretation on 05/21/2015 at 3:54 pm to Dr. Elnora Morrison , who verbally acknowledged these results.  Signed,  Dulcy Fanny. Earleen Newport, DO  [facility]  [facility]   Electronically Signed   By: Corrie Mckusick D.O.   On: 05/21/2015 15:56    Assessment: 81 y.o. female with multiple risk factors for stroke presenting with left anterior MCA territory ischemic infarction.  Stroke Risk Factors - hyperlipidemia and hypertension  Plan: 1. HgbA1c, fasting lipid panel 2. MRI, MRA  of  the brain without contrast 3. PT consult, OT consult, Speech consult 4. Echocardiogram 5. Carotid dopplers 6. Prophylactic therapy-Antiplatelet med: Aspirin  7. Risk factor modification 8. Telemetry monitoring  C.R. Nicole Kindred, MD [facility] [phone]  05/21/2015, 5:19 PM

## 2015-05-21 NOTE — ED Notes (Signed)
1 bag of belongings was brought with the pt to [facility]

## 2015-05-21 NOTE — ED Notes (Signed)
This RN went to get patient to room D31 but pt was being taken to CT. CT tech stated he will bring pt to room after CT.

## 2015-05-21 NOTE — ED Notes (Signed)
Neurology at bedside.

## 2015-05-22 ENCOUNTER — Inpatient Hospital Stay (HOSPITAL_COMMUNITY): Payer: Medicare Other

## 2015-05-22 DIAGNOSIS — I639 Cerebral infarction, unspecified: Secondary | ICD-10-CM | POA: Insufficient documentation

## 2015-05-22 DIAGNOSIS — F0390 Unspecified dementia without behavioral disturbance: Secondary | ICD-10-CM

## 2015-05-22 DIAGNOSIS — I6789 Other cerebrovascular disease: Secondary | ICD-10-CM

## 2015-05-22 DIAGNOSIS — I6300 Cerebral infarction due to thrombosis of unspecified precerebral artery: Secondary | ICD-10-CM

## 2015-05-22 LAB — LIPID PANEL
CHOL/HDL RATIO: 5.4 ratio
CHOLESTEROL: 248 mg/dL — AB (ref 0–200)
HDL: 46 mg/dL (ref >40)
LDL CALC: 187 mg/dL — AB (ref 0–99)
TRIGLYCERIDES: 75 mg/dL (ref <150)
VLDL: 15 mg/dL (ref 0–40)

## 2015-05-22 MED ORDER — CETYLPYRIDINIUM CHLORIDE 0.05 % MT LIQD
7.0000 mL | Freq: Two times a day (BID) | OROMUCOSAL | Status: DC
  Administered 2015-05-22 – 2015-05-23 (×2): 7 mL via OROMUCOSAL

## 2015-05-22 MED ORDER — CHLORHEXIDINE GLUCONATE 0.12 % MT SOLN
15.0000 mL | Freq: Two times a day (BID) | OROMUCOSAL | Status: DC
  Administered 2015-05-22 – 2015-05-24 (×5): 15 mL via OROMUCOSAL
  Filled 2015-05-22 (×4): qty 15

## 2015-05-22 MED ORDER — ATORVASTATIN CALCIUM 10 MG PO TABS
20.0000 mg | ORAL_TABLET | Freq: Every day | ORAL | Status: DC
  Administered 2015-05-22 – 2015-05-23 (×2): 20 mg via ORAL
  Filled 2015-05-22 (×2): qty 2

## 2015-05-22 MED ORDER — LORAZEPAM 2 MG/ML IJ SOLN: 0.5000 mg | Freq: Two times a day (BID) | INTRAMUSCULAR | Status: DC | PRN

## 2015-05-22 MED ORDER — CETYLPYRIDINIUM CHLORIDE 0.05 % MT LIQD
7.0000 mL | Freq: Two times a day (BID) | OROMUCOSAL | Status: DC
  Administered 2015-05-22 – 2015-05-23 (×3): 7 mL via OROMUCOSAL

## 2015-05-22 MED ORDER — LORAZEPAM 2 MG/ML IJ SOLN: 0.2500 mg | Freq: Every day | INTRAMUSCULAR | Status: DC | PRN

## 2015-05-22 NOTE — Evaluation (Signed)
Speech Language Pathology Evaluation Patient Details Name: Ariel Douglas MRN: [id number] DOB: June 08, 1919 Today's Date: 05/22/2015 Time:  -     Problem List:  Patient Active Problem List   Diagnosis Date Noted  . CVA (cerebral infarction) 05/21/2015  . Chronic kidney disease, stage 3 05/21/2015  . Dementia 05/21/2015  . Hypertension 05/21/2015  . UTI (urinary tract infection) 05/25/2013  . Acute renal failure 05/25/2013  . Syncope 05/24/2013   Past Medical History:  Past Medical History  Diagnosis Date  . Hypertension   . Coronary artery disease   . Dementia   . Hyperlipidemia   . Transient ischemic attack (TIA)    Past Surgical History: History reviewed. No pertinent past surgical history. HPI:  Ariel Douglas is a 81 y.o. female with a PMH of HTN, TIA, CAD, dementia, and hyperlipidemia. Pt was [facility] on [facility]/21/15, pt's daughter reported pt had slurred speech, difficulty swallowing food, drooling, and slight confusion. Pt's verbal output was limited to short phrases "thank you" and "yes". MRI head (05/22/15) showed acute LEFT MCA territory infarct predominately involving the insula, acute small RIGHT cerebellar infarct, and chronic changes including old RIGHT MCA territory infarct. Pt did not pass stroke swallow screen, currently NPO, daughter reported pt has had decreased appetite and 9 lbs weight loss over the past couple months.    Assessment / Plan / Recommendation Clinical Impression  Pt demonstrates poor speech intelligiblity due to very low volume. With max verbal cues, pt is able to increase volume with adequate articulation and language in short phrases. Linguistic function appears intact. Cognition is impaired at baseline and no care giver available to determine if there are acute changes in this area. Pt may benefit from f/u SLP therapy for speech intelligibility (volume) in best rehab setting, potentially home health. Will f/u with pt and attempt further contact with  family.     SLP Assessment  Patient needs continued Speech Lanaguage Pathology Services    Follow Up Recommendations       Frequency and Duration min 1 x/week  2 weeks   Pertinent Vitals/Pain Pain Assessment: No/denies pain   SLP Goals  Potential to Achieve Goals (ACUTE ONLY): Good  SLP Evaluation Prior Functioning  Cognitive/Linguistic Baseline: Baseline deficits Baseline deficit details: dementia   Cognition  Overall Cognitive Status: History of cognitive impairments - at baseline Arousal/Alertness: Awake/alert Orientation Level: Oriented to person;Disoriented to place;Disoriented to time;Disoriented to situation Attention: Sustained Sustained Attention: Appears intact Memory: Impaired Awareness: Impaired Awareness Impairment: Intellectual impairment Problem Solving: Impaired Problem Solving Impairment: Verbal basic    Comprehension  Auditory Comprehension Overall Auditory Comprehension: Appears within functional limits for tasks assessed    Expression Verbal Expression Overall Verbal Expression: Appears within functional limits for tasks assessed (adequate langauge, poorly intelligible) Interfering Components: Speech intelligibility   Oral / Motor Oral Motor/Sensory Function Overall Oral Motor/Sensory Function: Appears within functional limits for tasks assessed Motor Speech Overall Motor Speech: Impaired Respiration: Within functional limits Phonation: Low vocal intensity Articulation: Impaired Level of Impairment: Word Intelligibility: Intelligibility reduced Word: 25-49% accurate Phrase: 25-49% accurate Sentence: 25-49% accurate Conversation: 25-49% accurate Motor Planning: Witnin functional limits   GO    [employer], MA CCC-SLP [phone]  Claudine Mouton 05/22/2015, 1:08 PM

## 2015-05-22 NOTE — Progress Notes (Signed)
Physical Therapy Evaluation Patient Details Name: Ariel Douglas MRN: [id number] DOB: Mar 31, 1919 Today's Date: 05/22/2015   History of Present Illness  Ariel Douglas is an 81 y.o. female with a history of hypertension, hyperlipidemia, coronary artery disease, TIA and dementia, presenting with new onset speech abnormality as well as right facial droop and drooling. Pt with L anterior MCA infarct  Clinical Impression  Pt's PLOF has not been officially obtained due unable to reach family and patient poor historian due to dementia at baseline. However per chart pt was taking care of herself and ambulating. Pt currently functioning at [facility] for ADLs and OOB mobility. Pt with noted cognitive deficits as well as h/o dementia. If patient has 24/7 supervision available I highly recommend CIR to allow patient to achieve safe supervision level of function for transition home. If 24/7 assist not available patient/family may need to look at SNF options to allow for increase time to achieve safe mod I level of function prior to transition home.    Follow Up Recommendations CIR;Supervision/Assistance - 24 hour    Equipment Recommendations  None recommended by PT    Recommendations for Other Services Rehab consult     Precautions / Restrictions Precautions Precautions: Fall Precaution Comments: dementia  Restrictions Weight Bearing Restrictions: No      Mobility  Bed Mobility Overal bed mobility: Needs Assistance Bed Mobility: Supine to Sit     Supine to sit: Min assist     General bed mobility comments: assist for trunk elevation, pt utilized rocking/momentum technique  Transfers Overall transfer level: Needs assistance Equipment used: 1 person hand held assist Transfers: Sit to/from Stand Sit to Stand: Min assist         General transfer comment: tactile cues for guidance during transfer  Ambulation/Gait Ambulation/Gait assistance: Min assist Ambulation Distance (Feet): 120  Feet Assistive device: 1 person hand held assist Gait Pattern/deviations: Shuffle;Decreased stride length;Step-to pattern;Trunk flexed;Narrow base of support Gait velocity: slow Gait velocity interpretation: <1.8 ft/sec, indicative of risk for recurrent falls General Gait Details: pt with no episodes overt LOB however very unsteady requiring PT to steady pt to prevent from LOB. pt with occasional cross over gait pattern, pt amb with arms guarded to protect balance  Stairs            Wheelchair Mobility    Modified Rankin (Stroke Patients Only) Modified Rankin (Stroke Patients Only) Pre-Morbid Rankin Score: No significant disability Modified Rankin: Moderately severe disability     Balance Overall balance assessment: Needs assistance   Sitting balance-Leahy Scale: Fair     Standing balance support: Single extremity supported;During functional activity Standing balance-Leahy Scale: Fair Standing balance comment: requires physical assist for safe standing/amb                             Pertinent Vitals/Pain Pain Assessment: No/denies pain    Home Living Family/patient expects to be discharged to:: Unsure                 Additional Comments: per chart pt lives with daughter, and pt also reports she lives with her daughter however unable to reach family to determine pts PLOF and if they are there 24/7. Attempted both numbers in the chart and no answer. Pt poor historian and unable to report PLOF just that she lives with her daughter    Prior Function Level of Independence: Needs assistance   Gait / Transfers Assistance Needed: per chart  pt was walking and taking care of self for the most part, pt poor historian and unable to reach family           Hand Dominance        Extremity/Trunk Assessment   Upper Extremity Assessment: Generalized weakness           Lower Extremity Assessment: Generalized weakness      Cervical / Trunk  Assessment: Kyphotic  Communication   Communication: Expressive difficulties (soft, slurred speech)  Cognition Arousal/Alertness: Awake/alert Behavior During Therapy: WFL for tasks assessed/performed Overall Cognitive Status: History of cognitive impairments - at baseline (pt with history of dementia, was aware she was in hospital)                      General Comments General comments (skin integrity, edema, etc.): pt with episode of urinary incontinence. pt assisted to [facility] in which she complete hygiene with supervision    Exercises        Assessment/Plan    PT Assessment Patient needs continued PT services  PT Diagnosis Difficulty walking;Generalized weakness   PT Problem List Decreased strength;Decreased activity tolerance;Decreased balance;Decreased mobility;Decreased cognition;Decreased knowledge of use of DME;Decreased safety awareness;Cardiopulmonary status limiting activity  PT Treatment Interventions DME instruction;Gait training;Stair training;Functional mobility training;Therapeutic activities;Therapeutic exercise;Balance training   PT Goals (Current goals can be found in the Care Plan section) Acute Rehab PT Goals Patient Stated Goal: didn't state PT Goal Formulation: With patient Time For Goal Achievement: 05/29/15 Potential to Achieve Goals: Good Additional Goals Additional Goal #1: Pt to score > 19 on DGI to indicate minimal falls risk.    Frequency Min 4X/week   Barriers to discharge Other (comment) unsure if patient with have 24/7 supervision    Co-evaluation               End of Session Equipment Utilized During Treatment: Gait belt Activity Tolerance: Patient tolerated treatment well Patient left: in chair;with call bell/phone within reach;with chair alarm set (SLP heading into room) Nurse Communication: Mobility status         Time: 1028-1100 PT Time Calculation (min) (ACUTE ONLY): 32 min   Charges:   PT Evaluation $Initial PT  Evaluation Tier I: 1 Procedure PT Treatments $Gait Training: 8-22 mins   PT G CodesMarcene Brawn 05/22/2015, 11:57 AM  Lewis Shock, PT, DPT Pager #: [phone] Office #: [phone]

## 2015-05-22 NOTE — Progress Notes (Signed)
PROGRESS NOTE  Ariel Douglas ZOX:[id number] DOB: 1918-10-20 DOA: 05/21/2015 PCP: [name], MD  Assessment/Plan: CVA (cerebral infarction): Acute LEFT MCA territory infarct predominately involving the insula and Acute small RIGHT cerebellar infarct. -  dysphagia - Rectal aspirin -Nothing by mouth-IV fluids while nothing by mouth -Speech, physical therapy and occupational therapy eval- -MRI/MRA +, carotid Dopplers, 2-D echo, lipid panel- LDL elevated and A1c  Lipid when able to take PO   Chronic kidney disease, stage 3 -Stable   Dementia -Hold Namenda for now -Takes Ativan twice a day-will change this to IV and cut the dose in half   Hypertension -Hold atenolol and allow for permissive hypertension  Code Status: partial Family Communication: patient Disposition Plan:    Consultants:  neuro  Procedures:    HPI/Subjective: Getting echo  Objective: Filed Vitals:   05/22/15 0530  BP: 167/70  Pulse: 98  Temp: 98.7 F (37.1 C)  Resp: 16    Intake/Output Summary (Last 24 hours) at 05/22/15 0956 Last data filed at 05/21/15 1822  Gross per 24 hour  Intake      0 ml  Output     75 ml  Net    -75 ml   Filed Weights   05/21/15 2005  Weight: 55.8 kg (123 lb 0.3 oz)    Exam:   General:  awake  Cardiovascular: rrr  Respiratory: clear  Abdomen: +BS, soft  Musculoskeletal: unable to assess, getting echo   Data Reviewed: Basic Metabolic Panel:  Recent Labs Lab 05/21/15 1507 05/21/15 1539  NA 140 143  K 3.8 3.9  CL 107 107  CO2 22  --   GLUCOSE 96 93  BUN 16 20  CREATININE 1.55* 1.60*  CALCIUM 9.9  --    Liver Function Tests:  Recent Labs Lab 05/21/15 1507  AST 23  ALT 12*  ALKPHOS 58  BILITOT 0.6  PROT 7.3  ALBUMIN 3.7   No results for input(s): LIPASE, AMYLASE in the last 168 hours. No results for input(s): AMMONIA in the last 168 hours. CBC:  Recent Labs Lab 05/21/15 1507 05/21/15 1539  WBC 5.1  --   NEUTROABS 2.9  --    HGB 12.4 13.9  HCT 38.3 41.0  MCV 83.4  --   PLT 239  --    Cardiac Enzymes: No results for input(s): CKTOTAL, CKMB, CKMBINDEX, TROPONINI in the last 168 hours. BNP (last 3 results) No results for input(s): BNP in the last 8760 hours.  ProBNP (last 3 results) No results for input(s): PROBNP in the last 8760 hours.  CBG:  Recent Labs Lab 05/21/15 1509  GLUCAP 67    No results found for this or any previous visit (from the past 240 hour(s)).   Studies: Ct Head Wo Contrast  05/21/2015   CLINICAL DATA:  81 year old female with a history of acute speech disorder.  EXAM: CT HEAD WITHOUT CONTRAST  TECHNIQUE: Contiguous axial images were obtained from the base of the skull through the vertex without intravenous contrast.  COMPARISON:  02/06/2009, 05/23/2013  FINDINGS: Loss of gray-white differentiation of the left anterior insula and within the left frontal low.  No acute intracranial hemorrhage.  No midline shift or mass effect.  Hypodensity of the right frontal region is new from the comparison CT, though appears completed/remote.  Hypodense focus of the right cerebellar hemisphere, new from the comparison CT.  Unremarkable configuration of the ventricles.  Intracranial atherosclerosis.  Unremarkable appearance of the calvarium without acute fracture or aggressive lesion.  Unremarkable appearance of the scalp soft tissues.  Unremarkable appearance of the bilateral orbits.  .  Mastoid air cells are clear.  No significant paranasal sinus disease  IMPRESSION: Acute ischemia of the left anterior insula and left frontal lobe in the territory of the left MCA. Aspects score of 7.  Encephalomalacia with completed infarction of the right frontal lobe, new from the CT dated 05/23/2013. There is also a new focal infarction of the right cerebellar hemisphere, age indeterminate.  No evidence of intracranial hemorrhage.  Intracranial atherosclerosis.  These results were called by telephone at the time of  interpretation on 05/21/2015 at 3:54 pm to Dr. Blane Ohara , who verbally acknowledged these results.  Signed,  Yvone Neu. Loreta Ave, DO  [facility]  [facility]   Electronically Signed   By: Gilmer Mor D.O.   On: 05/21/2015 15:56   Mr Brain Wo Contrast  05/22/2015   CLINICAL DATA:  Slurred speech, dysphagia, confusion. History of hypertension, dementia, hyperlipidemia.  EXAM: MRI HEAD WITHOUT CONTRAST  MRA HEAD WITHOUT CONTRAST  TECHNIQUE: Multiplanar, multiecho pulse sequences of the brain and surrounding structures were obtained without intravenous contrast. Angiographic images of the head were obtained using MRA technique without contrast.  COMPARISON:  CT head May 21, 2015  FINDINGS: Motion degraded examination, per technologist note, patient was confused, and could not remain still.  MRI HEAD FINDINGS  Subcentimeter focus of reduced diffusion RIGHT cerebellum. Reduced diffusion LEFT frontal lobe/insula with corresponding low ADC values corresponding to CT abnormality. No susceptibility artifact to suggest hemorrhage. Ventricles and sulci are normal for patient's age. Old small cerebellar infarcts. RIGHT inferior basal ganglia perivascular space. Multiple tiny perivascular spaces throughout the cerebrum. Minimal white matter changes compatible chronic small vessel ischemic disease, which may be under appreciated due to patient motion. RIGHT frontal encephalomalacia extending to the insula.  No abnormal extra-axial fluid collections. Status post bilateral ocular lens implants. Mild paranasal sinus mucosal thickening. Possible polyp LEFT posterior nasal turbinate. Trace LEFT mastoid effusion. No abnormal sellar expansion. No cerebellar tonsillar ectopia. No suspicious calvarial bone marrow signal.  MRA HEAD FINDINGS  Moderate to severely motion degraded examination.  Flow related enhancement within the bilateral internal carotid arteries, bilateral anterior  cerebral arteries. Flow related enhancement of the bilateral middle cerebral arteries.  Bilateral vertebral arteries are patent, basilar artery is patent. Small LEFT posterior communicating artery is present. Bilateral posterior cerebral arteries are patent.  IMPRESSION: MRI HEAD: Moderately motion degraded examination.  Acute LEFT MCA territory infarct predominately involving the insula.  Acute small RIGHT cerebellar infarct.  Chronic changes including old RIGHT MCA territory infarct.  MRA HEAD: Moderate to severely motion degraded examination without acute large vessel occlusion.   Electronically Signed   By: Awilda Metro M.D.   On: 05/22/2015 03:39   Mr Maxine Glenn Head/brain Wo Cm  05/22/2015   CLINICAL DATA:  Slurred speech, dysphagia, confusion. History of hypertension, dementia, hyperlipidemia.  EXAM: MRI HEAD WITHOUT CONTRAST  MRA HEAD WITHOUT CONTRAST  TECHNIQUE: Multiplanar, multiecho pulse sequences of the brain and surrounding structures were obtained without intravenous contrast. Angiographic images of the head were obtained using MRA technique without contrast.  COMPARISON:  CT head May 21, 2015  FINDINGS: Motion degraded examination, per technologist note, patient was confused, and could not remain still.  MRI HEAD FINDINGS  Subcentimeter focus of reduced diffusion RIGHT cerebellum. Reduced diffusion LEFT frontal lobe/insula with corresponding low ADC values corresponding to CT abnormality. No susceptibility artifact to suggest  hemorrhage. Ventricles and sulci are normal for patient's age. Old small cerebellar infarcts. RIGHT inferior basal ganglia perivascular space. Multiple tiny perivascular spaces throughout the cerebrum. Minimal white matter changes compatible chronic small vessel ischemic disease, which may be under appreciated due to patient motion. RIGHT frontal encephalomalacia extending to the insula.  No abnormal extra-axial fluid collections. Status post bilateral ocular lens  implants. Mild paranasal sinus mucosal thickening. Possible polyp LEFT posterior nasal turbinate. Trace LEFT mastoid effusion. No abnormal sellar expansion. No cerebellar tonsillar ectopia. No suspicious calvarial bone marrow signal.  MRA HEAD FINDINGS  Moderate to severely motion degraded examination.  Flow related enhancement within the bilateral internal carotid arteries, bilateral anterior cerebral arteries. Flow related enhancement of the bilateral middle cerebral arteries.  Bilateral vertebral arteries are patent, basilar artery is patent. Small LEFT posterior communicating artery is present. Bilateral posterior cerebral arteries are patent.  IMPRESSION: MRI HEAD: Moderately motion degraded examination.  Acute LEFT MCA territory infarct predominately involving the insula.  Acute small RIGHT cerebellar infarct.  Chronic changes including old RIGHT MCA territory infarct.  MRA HEAD: Moderate to severely motion degraded examination without acute large vessel occlusion.   Electronically Signed   By: Awilda Metro M.D.   On: 05/22/2015 03:39    Scheduled Meds: .  stroke: mapping our early stages of recovery book   Does not apply Once  . aspirin  300 mg Rectal Daily  . enoxaparin (LOVENOX) injection  40 mg Subcutaneous Q24H  . LORazepam  0.25 mg Intravenous Daily  . LORazepam  0.5 mg Intravenous QHS   Continuous Infusions: . sodium chloride     Antibiotics Given (last 72 hours)    None      Principal Problem:   CVA (cerebral infarction) Active Problems:   Chronic kidney disease, stage 3   Dementia   Hypertension    Time spent: 25 min    VANN, JESSICA  [facility] Pager [phone] If 7PM-7AM, please contact night-coverage at [email or web address], password [facility] 05/22/2015, 9:56 AM  LOS: 1 day

## 2015-05-22 NOTE — Evaluation (Signed)
Occupational Therapy Evaluation Patient Details Name: Ariel Douglas MRN: [id number] DOB: 10/12/1918 Today's Date: 05/22/2015    History of Present Illness Ariel Douglas is an 81 y.o. female with a history of hypertension, hyperlipidemia, coronary artery disease, TIA and dementia, presenting with new onset speech abnormality as well as right facial droop and drooling. Pt with L anterior MCA infarct   Clinical Impression   Pt was ambulating independently and negotiating stairs prior to admission. Her family assisted with tub bathing, but pt toileted, dressed and fed herself.  Pt presents with baseline impaired cognition, generalized weakness and decreased balance interfering with ability to function at her baseline.  Pt has excellent family support.  Recommending CIR.    Follow Up Recommendations  CIR;Supervision/Assistance - 24 hour    Equipment Recommendations   (to be determine in next venue)    Recommendations for Other Services       Precautions / Restrictions Precautions Precautions: Fall Precaution Comments: dementia  Restrictions Weight Bearing Restrictions: No      Mobility Bed Mobility               General bed mobility comments: pt in chair  Transfers Overall transfer level: Needs assistance Equipment used: 1 person hand held assist Transfers: Sit to/from Stand;Stand Pivot Transfers Sit to Stand: Min assist Stand pivot transfers: Min assist       General transfer comment: multimodal cues during transfer    Balance Overall balance assessment: Needs assistance   Sitting balance-Leahy Scale: Fair       Standing balance-Leahy Scale: Fair                              ADL Overall ADL's : Needs assistance/impaired Eating/Feeding: Minimal assistance;Sitting Eating/Feeding Details (indicate cue type and reason): set up and cues to swallow and wipe mouth, loses food from mouth frequently Grooming: Wash/dry hands;Wash/dry face;Set  up;Sitting;Brushing hair;Maximal assistance   Upper Body Bathing: Minimal assitance;Sitting   Lower Body Bathing: Minimal assistance;Sit to/from stand   Upper Body Dressing : Minimal assistance;Sitting   Lower Body Dressing: Moderate assistance;Sit to/from stand   Toilet Transfer: Minimal assistance;Stand-pivot;BSC   Toileting- Clothing Manipulation and Hygiene: Minimal assistance;Sit to/from stand               Vision Additional Comments: eyes do not appear aligned, per family this is baseline   Perception     Praxis      Pertinent Vitals/Pain Pain Assessment: No/denies pain     Hand Dominance Right   Extremity/Trunk Assessment Upper Extremity Assessment Upper Extremity Assessment: Generalized weakness   Lower Extremity Assessment Lower Extremity Assessment: Generalized weakness   Cervical / Trunk Assessment Cervical / Trunk Assessment: Kyphotic   Communication Communication Communication: Expressive difficulties   Cognition Arousal/Alertness: Awake/alert Behavior During Therapy: WFL for tasks assessed/performed Overall Cognitive Status: History of cognitive impairments - at baseline                     General Comments       Exercises       Shoulder Instructions      Home Living Family/patient expects to be discharged to:: Private residence Living Arrangements: Children Available Help at Discharge: Family;Available 24 hours/day Type of Home: Other(Comment) (townhome) Home Access: Stairs to enter [employer] of Steps: 7 Entrance Stairs-Rails: Right;Left;Can reach both Home Layout: Multi-level Alternate Level Stairs-Number of Steps: 2 flights   Bathroom Shower/Tub: Tub/shower unit  Bathroom Toilet: Standard     Home Equipment: None   Additional Comments: pt lives with her daughter who is in good health, many generations available to help      Prior Functioning/Environment Level of Independence: Needs assistance   Gait / Transfers Assistance Needed: pt walks without a device, negotiates stairs independently ADL's / Homemaking Assistance Needed: family runs water for her bath, pt bathes and dresses herself, family does cooking and cleaning        OT Diagnosis: Generalized weakness;Cognitive deficits   OT Problem List: Decreased strength;Decreased activity tolerance;Impaired balance (sitting and/or standing);Decreased safety awareness;Decreased cognition   OT Treatment/Interventions: Self-care/ADL training;DME and/or AE instruction;Therapeutic activities;Patient/family education;Balance training    OT Goals(Current goals can be found in the care plan section) Acute Rehab OT Goals Patient Stated Goal: return to PLOF (family's goal) OT Goal Formulation: With family Time For Goal Achievement: 06/05/15 Potential to Achieve Goals: Good ADL Goals Pt Will Perform Grooming: standing;with supervision Pt Will Perform Upper Body Bathing: sitting;with supervision Pt Will Perform Lower Body Bathing: with supervision;sit to/from stand Pt Will Perform Upper Body Dressing: with supervision;sitting Pt Will Perform Lower Body Dressing: with supervision;sit to/from stand Pt Will Transfer to Toilet: with supervision;ambulating;regular height toilet Pt Will Perform Toileting - Clothing Manipulation and hygiene: with supervision;sit to/from stand  OT Frequency: Min 2X/week   Barriers to D/C:            Co-evaluation              End of Session Equipment Utilized During Treatment: Gait belt  Activity Tolerance: Patient tolerated treatment well Patient left: in chair;with call bell/phone within reach;with chair alarm set   Time: 1610-9604 OT Time Calculation (min): 25 min Charges:  OT General Charges $OT Visit: 1 Procedure OT Evaluation $Initial OT Evaluation Tier I: 1 Procedure OT Treatments $Self Care/Home Management : 8-22 mins G-Codes:    Evern Bio 05/22/2015, 3:33 PM   [phone]

## 2015-05-22 NOTE — Progress Notes (Signed)
Patient received from [facility] ED to 5C12 at 2000, alert. Patient oriented to room and equipment. MAEW. Slurred speech. Initial NIHSS was 4. VSS. All safety measures in place. Will monitor closely overnight.

## 2015-05-22 NOTE — Consult Note (Signed)
Physical Medicine and Rehabilitation Consult Reason for Consult: Acute left MCA territory infarct as well as small right cerebellar infarct Referring Physician: [facility]ad   HPI: Ariel Douglas is a 81 y.o. right handed female with history of hypertension, dementia, chronic renal insufficiency with baseline creatinine 1.55, coronary artery disease, TIA maintained on aspirin 81 mg daily. She lives with her daughter was independent with basic dressing and grooming prior to admission without assistive device. Her daughter provides the meals and 24 hour assistance is needed. Presented 05/21/2015 with slurred speech and right-sided weakness. MRI of the brain showed a acute left MCA territory infarct as well as small right cerebellar infarct. MRA of the head without large vessel occlusion. Carotid Dopplers with no ICA stenosis. Echocardiogram pending. Patient did not receive TPA. Neurology consulted placed on aspirin 325 mg daily as well as subcutaneous Lovenox for DVT prophylaxis. Currently on a dysphagia #1 thin liquid diet. Physical therapy evaluation completed 05/22/2015 with recommendations of physical medicine rehabilitation consult.   Review of Systems  Constitutional: Negative for fever and chills.  HENT: Positive for hearing loss.   Eyes: Negative for blurred vision and double vision.  Respiratory: Negative for shortness of breath.   Cardiovascular: Negative for chest pain, palpitations and leg swelling.  Gastrointestinal: Positive for constipation. Negative for nausea, vomiting and abdominal pain.  Musculoskeletal: Positive for myalgias and joint pain.  Skin: Negative for rash.  Neurological: Positive for speech change and weakness. Negative for headaches.  Psychiatric/Behavioral: Positive for memory loss.   Past Medical History  Diagnosis Date  . Hypertension   . Coronary artery disease   . Dementia   . Hyperlipidemia   . Transient ischemic attack (TIA)    History reviewed. No  pertinent past surgical history. History reviewed. No pertinent family history. Social History:  reports that she has quit smoking. She does not have any smokeless tobacco history on file. She reports that she does not drink alcohol or use illicit drugs. Allergies:  Allergies  Allergen Reactions  . Penicillins Swelling    Facial and hand swelling per daughter Has patient had a PCN reaction causing immediate rash, facial/tongue/throat swelling, SOB or lightheadedness with hypotension: Yes Has patient had a PCN reaction causing severe rash involving mucus membranes or skin necrosis: No Has patient had a PCN reaction that required hospitalization No Has patient had a PCN reaction occurring within the last 10 years: No If all of the above answers are "NO", then may proceed with Cephalosporin use.   Medications Prior to Admission  Medication Sig Dispense Refill  . aspirin (ASPIRIN EC) 81 MG EC tablet Take 81 mg by mouth daily. Swallow whole.    Marland Kitchen atenolol (TENORMIN) 25 MG tablet Take 12.5 mg by mouth daily.    . beta carotene w/minerals (OCUVITE) tablet Take 1 tablet by mouth daily.    Marland Kitchen bismuth subsalicylate (PEPTO BISMOL) 262 MG/15ML suspension Take 30 mLs by mouth daily as needed for indigestion.     . Calcium Citrate-Vitamin D (CALCIUM CITRATE + D3 PO) Take 1 tablet by mouth 2 (two) times daily. Per tablet: calcium citrate 200 mg, vitamin D3 250 i.u.    . cholecalciferol (VITAMIN D) 1000 UNITS tablet Take 1,000 Units by mouth 2 (two) times daily.    Marland Kitchen l-methylfolate-b2-b6-b12 (CEREFOLIN) 01-31-49-5 MG TABS Take 1 tablet by mouth daily.    Marland Kitchen LORazepam (ATIVAN) 1 MG tablet Take 0.5-1 mg by mouth 2 (two) times daily. Take 1/2 tablet (0.5 mg) by  mouth every morning and 1 tablet (1 mg) at bedtime    . memantine (NAMENDA) 10 MG tablet Take 10 mg by mouth 2 (two) times daily.    . Omega-3 Fatty Acids (FISH OIL) 1200 MG CAPS Take 1,200 mg by mouth daily.      Home: Home Living Family/patient  expects to be discharged to:: Unsure Additional Comments: per chart pt lives with daughter, and pt also reports she lives with her daughter however unable to reach family to determine pts PLOF and if they are there 24/7. Attempted both numbers in the chart and no answer. Pt poor historian and unable to report PLOF just that she lives with her daughter  Functional History: Prior Function Level of Independence: Needs assistance Gait / Transfers Assistance Needed: per chart pt was walking and taking care of self for the most part, pt poor historian and unable to reach family Functional Status:  Mobility: Bed Mobility Overal bed mobility: Needs Assistance Bed Mobility: Supine to Sit Supine to sit: Min assist General bed mobility comments: assist for trunk elevation, pt utilized rocking/momentum technique Transfers Overall transfer level: Needs assistance Equipment used: 1 person hand held assist Transfers: Sit to/from Stand Sit to Stand: Min assist General transfer comment: tactile cues for guidance during transfer Ambulation/Gait Ambulation/Gait assistance: Min assist Ambulation Distance (Feet): 120 Feet Assistive device: 1 person hand held assist Gait Pattern/deviations: Shuffle, Decreased stride length, Step-to pattern, Trunk flexed, Narrow base of support General Gait Details: pt with no episodes overt LOB however very unsteady requiring PT to steady pt to prevent from LOB. pt with occasional cross over gait pattern, pt amb with arms guarded to protect balance Gait velocity: slow Gait velocity interpretation: <1.8 ft/sec, indicative of risk for recurrent falls    ADL:    Cognition: Cognition Overall Cognitive Status: History of cognitive impairments - at baseline (pt with history of dementia, was aware she was in hospital) Cognition Arousal/Alertness: Awake/alert Behavior During Therapy: WFL for tasks assessed/performed Overall Cognitive Status: History of cognitive impairments  - at baseline (pt with history of dementia, was aware she was in hospital)  Blood pressure 167/70, pulse 98, temperature 98.7 F (37.1 C), temperature source Oral, resp. rate 16, weight 55.8 kg (123 lb 0.3 oz), SpO2 100 %. Physical Exam  Constitutional:  81 year old African-American female in no acute distress.  Eyes: EOM are normal.  Neck: Normal range of motion. Neck supple. No thyromegaly present.  Cardiovascular: Normal rate and regular rhythm.   Respiratory: Effort normal and breath sounds normal.  GI: Soft. Bowel sounds are normal. She exhibits no distension. There is no tenderness.  Neurological: She is alert.  Makes good eye contact with examiner. Speech is dysarthric. She is pleasantly confused. She was able to provide her first name but not last name or age. Follows simple demonstrated commands  Skin: Skin is warm and dry.    Results for orders placed or performed during the hospital encounter of 05/21/15 (from the past 24 hour(s))  Protime-INR     Status: None   Collection Time: 05/21/15  3:07 PM  Result Value Ref Range   Prothrombin Time 14.5 11.6 - 15.2 seconds   INR 1.11 0.00 - 1.49  APTT     Status: Abnormal   Collection Time: 05/21/15  3:07 PM  Result Value Ref Range   aPTT 23 (L) 24 - 37 seconds  CBC     Status: None   Collection Time: 05/21/15  3:07 PM  Result Value Ref Range  WBC 5.1 4.0 - 10.5 K/uL   RBC 4.59 3.87 - 5.11 MIL/uL   Hemoglobin 12.4 12.0 - 15.0 g/dL   HCT 16.1 09.6 - 04.5 %   MCV 83.4 78.0 - 100.0 fL   MCH 27.0 26.0 - 34.0 pg   MCHC 32.4 30.0 - 36.0 g/dL   RDW 40.9 81.1 - 91.4 %   Platelets 239 150 - 400 K/uL  Differential     Status: None   Collection Time: 05/21/15  3:07 PM  Result Value Ref Range   Neutrophils Relative % 57 %   Neutro Abs 2.9 1.7 - 7.7 K/uL   Lymphocytes Relative 32 %   Lymphs Abs 1.6 0.7 - 4.0 K/uL   Monocytes Relative 8 %   Monocytes Absolute 0.4 0.1 - 1.0 K/uL   Eosinophils Relative 3 %   Eosinophils Absolute  0.1 0.0 - 0.7 K/uL   Basophils Relative 0 %   Basophils Absolute 0.0 0.0 - 0.1 K/uL  Comprehensive metabolic panel     Status: Abnormal   Collection Time: 05/21/15  3:07 PM  Result Value Ref Range   Sodium 140 135 - 145 mmol/L   Potassium 3.8 3.5 - 5.1 mmol/L   Chloride 107 101 - 111 mmol/L   CO2 22 22 - 32 mmol/L   Glucose, Bld 96 65 - 99 mg/dL   BUN 16 6 - 20 mg/dL   Creatinine, Ser 7.82 (H) 0.44 - 1.00 mg/dL   Calcium 9.9 8.9 - 95.6 mg/dL   Total Protein 7.3 6.5 - 8.1 g/dL   Albumin 3.7 3.5 - 5.0 g/dL   AST 23 15 - 41 U/L   ALT 12 (L) 14 - 54 U/L   Alkaline Phosphatase 58 38 - 126 U/L   Total Bilirubin 0.6 0.3 - 1.2 mg/dL   GFR calc non Af Amer 27 (L) >60 mL/min   GFR calc Af Amer 32 (L) >60 mL/min   Anion gap 11 5 - 15  CBG monitoring, ED     Status: None   Collection Time: 05/21/15  3:09 PM  Result Value Ref Range   Glucose-Capillary 67 65 - 99 mg/dL  I-stat troponin, ED (not at [facility], [facility])     Status: None   Collection Time: 05/21/15  3:27 PM  Result Value Ref Range   Troponin i, poc 0.01 0.00 - 0.08 ng/mL   Comment 3          I-Stat Chem 8, ED  (not at [facility], [facility])     Status: Abnormal   Collection Time: 05/21/15  3:39 PM  Result Value Ref Range   Sodium 143 135 - 145 mmol/L   Potassium 3.9 3.5 - 5.1 mmol/L   Chloride 107 101 - 111 mmol/L   BUN 20 6 - 20 mg/dL   Creatinine, Ser 2.13 (H) 0.44 - 1.00 mg/dL   Glucose, Bld 93 65 - 99 mg/dL   Calcium, Ion 0.86 5.78 - 1.30 mmol/L   TCO2 25 0 - 100 mmol/L   Hemoglobin 13.9 12.0 - 15.0 g/dL   HCT 46.9 62.9 - 52.8 %  Lipid panel     Status: Abnormal   Collection Time: 05/22/15  5:15 AM  Result Value Ref Range   Cholesterol 248 (H) 0 - 200 mg/dL   Triglycerides 75 <413 mg/dL   HDL 46 >24 mg/dL   Total CHOL/HDL Ratio 5.4 RATIO   VLDL 15 0 - 40 mg/dL   LDL Cholesterol 401 (H) 0 - 99 mg/dL  Ct Head Wo Contrast  05/21/2015   CLINICAL DATA:  81 year old female with a history of acute speech disorder.  EXAM: CT HEAD  WITHOUT CONTRAST  TECHNIQUE: Contiguous axial images were obtained from the base of the skull through the vertex without intravenous contrast.  COMPARISON:  02/06/2009, 05/23/2013  FINDINGS: Loss of gray-white differentiation of the left anterior insula and within the left frontal low.  No acute intracranial hemorrhage.  No midline shift or mass effect.  Hypodensity of the right frontal region is new from the comparison CT, though appears completed/remote.  Hypodense focus of the right cerebellar hemisphere, new from the comparison CT.  Unremarkable configuration of the ventricles.  Intracranial atherosclerosis.  Unremarkable appearance of the calvarium without acute fracture or aggressive lesion.  Unremarkable appearance of the scalp soft tissues.  Unremarkable appearance of the bilateral orbits.  .  Mastoid air cells are clear.  No significant paranasal sinus disease  IMPRESSION: Acute ischemia of the left anterior insula and left frontal lobe in the territory of the left MCA. Aspects score of 7.  Encephalomalacia with completed infarction of the right frontal lobe, new from the CT dated 05/23/2013. There is also a new focal infarction of the right cerebellar hemisphere, age indeterminate.  No evidence of intracranial hemorrhage.  Intracranial atherosclerosis.  These results were called by telephone at the time of interpretation on 05/21/2015 at 3:54 pm to Dr. Blane Ohara , who verbally acknowledged these results.  Signed,  Yvone Neu. Loreta Ave, DO  [facility]  [facility]   Electronically Signed   By: Gilmer Mor D.O.   On: 05/21/2015 15:56   Mr Brain Wo Contrast  05/22/2015   CLINICAL DATA:  Slurred speech, dysphagia, confusion. History of hypertension, dementia, hyperlipidemia.  EXAM: MRI HEAD WITHOUT CONTRAST  MRA HEAD WITHOUT CONTRAST  TECHNIQUE: Multiplanar, multiecho pulse sequences of the brain and surrounding structures were obtained without intravenous  contrast. Angiographic images of the head were obtained using MRA technique without contrast.  COMPARISON:  CT head May 21, 2015  FINDINGS: Motion degraded examination, per technologist note, patient was confused, and could not remain still.  MRI HEAD FINDINGS  Subcentimeter focus of reduced diffusion RIGHT cerebellum. Reduced diffusion LEFT frontal lobe/insula with corresponding low ADC values corresponding to CT abnormality. No susceptibility artifact to suggest hemorrhage. Ventricles and sulci are normal for patient's age. Old small cerebellar infarcts. RIGHT inferior basal ganglia perivascular space. Multiple tiny perivascular spaces throughout the cerebrum. Minimal white matter changes compatible chronic small vessel ischemic disease, which may be under appreciated due to patient motion. RIGHT frontal encephalomalacia extending to the insula.  No abnormal extra-axial fluid collections. Status post bilateral ocular lens implants. Mild paranasal sinus mucosal thickening. Possible polyp LEFT posterior nasal turbinate. Trace LEFT mastoid effusion. No abnormal sellar expansion. No cerebellar tonsillar ectopia. No suspicious calvarial bone marrow signal.  MRA HEAD FINDINGS  Moderate to severely motion degraded examination.  Flow related enhancement within the bilateral internal carotid arteries, bilateral anterior cerebral arteries. Flow related enhancement of the bilateral middle cerebral arteries.  Bilateral vertebral arteries are patent, basilar artery is patent. Small LEFT posterior communicating artery is present. Bilateral posterior cerebral arteries are patent.  IMPRESSION: MRI HEAD: Moderately motion degraded examination.  Acute LEFT MCA territory infarct predominately involving the insula.  Acute small RIGHT cerebellar infarct.  Chronic changes including old RIGHT MCA territory infarct.  MRA HEAD: Moderate to severely motion degraded examination without acute large vessel occlusion.   Electronically  Signed   By: Awilda Metro M.D.   On: 05/22/2015 03:39   Mr Maxine Glenn Head/brain Wo Cm  05/22/2015   CLINICAL DATA:  Slurred speech, dysphagia, confusion. History of hypertension, dementia, hyperlipidemia.  EXAM: MRI HEAD WITHOUT CONTRAST  MRA HEAD WITHOUT CONTRAST  TECHNIQUE: Multiplanar, multiecho pulse sequences of the brain and surrounding structures were obtained without intravenous contrast. Angiographic images of the head were obtained using MRA technique without contrast.  COMPARISON:  CT head May 21, 2015  FINDINGS: Motion degraded examination, per technologist note, patient was confused, and could not remain still.  MRI HEAD FINDINGS  Subcentimeter focus of reduced diffusion RIGHT cerebellum. Reduced diffusion LEFT frontal lobe/insula with corresponding low ADC values corresponding to CT abnormality. No susceptibility artifact to suggest hemorrhage. Ventricles and sulci are normal for patient's age. Old small cerebellar infarcts. RIGHT inferior basal ganglia perivascular space. Multiple tiny perivascular spaces throughout the cerebrum. Minimal white matter changes compatible chronic small vessel ischemic disease, which may be under appreciated due to patient motion. RIGHT frontal encephalomalacia extending to the insula.  No abnormal extra-axial fluid collections. Status post bilateral ocular lens implants. Mild paranasal sinus mucosal thickening. Possible polyp LEFT posterior nasal turbinate. Trace LEFT mastoid effusion. No abnormal sellar expansion. No cerebellar tonsillar ectopia. No suspicious calvarial bone marrow signal.  MRA HEAD FINDINGS  Moderate to severely motion degraded examination.  Flow related enhancement within the bilateral internal carotid arteries, bilateral anterior cerebral arteries. Flow related enhancement of the bilateral middle cerebral arteries.  Bilateral vertebral arteries are patent, basilar artery is patent. Small LEFT posterior communicating artery is present.  Bilateral posterior cerebral arteries are patent.  IMPRESSION: MRI HEAD: Moderately motion degraded examination.  Acute LEFT MCA territory infarct predominately involving the insula.  Acute small RIGHT cerebellar infarct.  Chronic changes including old RIGHT MCA territory infarct.  MRA HEAD: Moderate to severely motion degraded examination without acute large vessel occlusion.   Electronically Signed   By: Awilda Metro M.D.   On: 05/22/2015 03:39    Assessment/Plan: Diagnosis: left MCA infarct, prior hx of dementia 1. Does the need for close, 24 hr/day medical supervision in concert with the patient's rehab needs make it unreasonable for this patient to be served in a less intensive setting? Yes 2. Co-Morbidities requiring supervision/potential complications: ckd3, htn 3. Due to bladder management, bowel management, safety, skin/wound care, disease management, medication administration, pain management and patient education, does the patient require 24 hr/day rehab nursing? Yes 4. Does the patient require coordinated care of a physician, rehab nurse, PT (1-2 hrs/day, 5 days/week), OT (1-2 hrs/day, 5 days/week) and SLP (1-2 hrs/day, 5 days/week) to address physical and functional deficits in the context of the above medical diagnosis(es)? Potentially Addressing deficits in the following areas: balance, endurance, locomotion, strength, transferring, bowel/bladder control, bathing, dressing, feeding, grooming, toileting, cognition, speech, language and psychosocial support 5. Can the patient actively participate in an intensive therapy program of at least 3 hrs of therapy per day at least 5 days per week? Potentially 6. The potential for patient to make measurable gains while on inpatient rehab is good and fair 7. Anticipated functional outcomes upon discharge from inpatient rehab are supervision  with PT, supervision with OT, supervision and min assist with SLP. 8. Estimated rehab length of stay to  reach the above functional goals is: 7 days potentially 9. Does the patient have adequate social supports and living environment to accommodate these discharge functional goals? Yes and Potentially 10. Anticipated D/C setting: Home  11. Anticipated post D/C treatments: HH therapy 12. Overall Rehab/Functional Prognosis: good  RECOMMENDATIONS: This patient's condition is appropriate for continued rehabilitative care in the following setting: see below Patient has agreed to participate in recommended program. N/A Note that insurance prior authorization may be required for reimbursement for recommended care.  Comment: Pt with dementia at baseline. Unclear of premorbid status. Language issues are new. Need to follow up with the family regarding her premorbid activity and care needs.   Ranelle Oyster, MD, [facility] Physical Medicine & Rehabilitation 05/22/2015     05/22/2015

## 2015-05-22 NOTE — Progress Notes (Signed)
I will follow up in the morning to assist with planning rehab venue most appropriate. [phone]

## 2015-05-22 NOTE — Progress Notes (Signed)
Echocardiogram 2D Echocardiogram has been performed.  Ariel Douglas 05/22/2015, 11:18 AM

## 2015-05-22 NOTE — Evaluation (Signed)
Clinical/Bedside Swallow Evaluation Patient Details  Name: Ariel Douglas MRN: [id number] Date of Birth: 04-Apr-1919  Today's Date: 05/22/2015 Time: SLP Start Time (ACUTE ONLY): 1115 SLP Stop Time (ACUTE ONLY): 1145 SLP Time Calculation (min) (ACUTE ONLY): 30 min  Past Medical History:  Past Medical History  Diagnosis Date  . Hypertension   . Coronary artery disease   . Dementia   . Hyperlipidemia   . Transient ischemic attack (TIA)    Past Surgical History: History reviewed. No pertinent past surgical history. HPI:  Ariel Douglas is a 81 y.o. female with a PMH of HTN, TIA, CAD, dementia, and hyperlipidemia. Pt was [facility] to [facility] on 05/21/15, pt's daughter reported pt had slurred speech, difficulty swallowing food, drooling, and slight confusion. Pt's verbal output was limited to short phrases "thank you" and "yes". MRI head (05/22/15) showed acute LEFT MCA territory infarct predominately involving the insula, acute small RIGHT cerebellar infarct, and chronic changes including old RIGHT MCA territory infarct. Pt did not pass stroke swallow screen, currently NPO, daughter reported pt has had decreased appetite and 9 lbs weight loss over the past couple months.    Assessment / Plan / Recommendation Clinical Impression  Pt presents with mild oral dysphagia potentially caused by reported baseline dementia. Pt showed no s/sx of aspiration with all consistencies. Pt had oral residue with puree and solids, cleared with verbal cue from SLP. SLP recommends dysphagia 1 (puree) and thin liquids due to impaired mastication of solids (missing dentition on right side) and to facilitate adequate nutritional intake (reported weight loss). Pt's risk of aspiration is mild. SLP will f/u with pt's daughter about pt's potential history of dysphagia and the pt's diet tolerance.     Aspiration Risk  Moderate    Diet Recommendation Dysphagia 1 (Puree);Thin   Medication Administration: Whole meds with puree  (crushed if needed) Compensations: Slow rate;Check for pocketing;Small sips/bites    Other  Recommendations     Follow Up Recommendations       Frequency and Duration min 1 x/week  2 weeks   Pertinent Vitals/Pain NA    SLP Swallow Goals     Swallow Study Prior Functional Status  Cognitive/Linguistic Baseline: Baseline deficits Baseline deficit details: dementia    General Other Pertinent Information: Ariel Douglas is a 81 y.o. female with a PMH of HTN, TIA, CAD, dementia, and hyperlipidemia. Pt was brought to [facility] on 05/21/15, pt's daughter reported pt had slurred speech, difficulty swallowing food, drooling, and slight confusion. Pt's verbal output was limited to short phrases "thank you" and "yes". MRI head (05/22/15) showed acute LEFT MCA territory infarct predominately involving the insula, acute small RIGHT cerebellar infarct, and chronic changes including old RIGHT MCA territory infarct. Pt did not pass stroke swallow screen, currently NPO, daughter reported pt has had decreased appetite and 9 lbs weight loss over the past couple months.  Type of Study: Bedside swallow evaluation Diet Prior to this Study: NPO Temperature Spikes Noted: No Respiratory Status: Room air History of Recent Intubation: No Behavior/Cognition: Cooperative;Pleasant mood;Alert;Requires cueing Oral Cavity - Dentition: Poor condition;Missing dentition Self-Feeding Abilities: Needs assist Patient Positioning: Upright in chair/Tumbleform Baseline Vocal Quality: Low vocal intensity;Normal Volitional Cough: Cognitively unable to elicit Volitional Swallow: Unable to elicit    Oral/Motor/Sensory Function Overall Oral Motor/Sensory Function: Appears within functional limits for tasks assessed   Ice Chips Ice chips: Within functional limits Presentation: Spoon   Thin Liquid Thin Liquid: Impaired Presentation: Cup;Spoon;Straw Oral Phase Functional Implications: Right anterior spillage;Left  anterior spillage     Nectar Thick Nectar Thick Liquid: Not tested   Honey Thick Honey Thick Liquid: Not tested   Puree Puree: Impaired Presentation: Self Fed Oral Phase Impairments: Impaired mastication Oral Phase Functional Implications: Oral residue   Solid   GO    Solid: Impaired Presentation: Self Fed Oral Phase Impairments: Impaired mastication Oral Phase Functional Implications: Oral residue      Riccardo Dubin, Student-SLP  Riccardo Dubin 05/22/2015,1:14 PM

## 2015-05-22 NOTE — Progress Notes (Signed)
Rehab Admissions Coordinator Note:  Patient was screened by Trish Mage for appropriateness for an Inpatient Acute Rehab Consult.  At this time, we are recommending Inpatient Rehab consult.  Trish Mage 05/22/2015, 12:04 PM  I can be reached at [phone].

## 2015-05-22 NOTE — Progress Notes (Signed)
Stroke Team Progress Note  HISTORY Ariel Douglas is an 81 y.o. female with a history of hypertension, hyperlipidemia, coronary artery disease, TIA and dementia, presenting with new onset speech abnormality as well as right facial droop and drooling. As seen well at 10 PM last night. She was noted this morning to have episodes in speech as well as a facial droop and drooling. Weakness has been noted. Patient can understand what is being said to her that her speech output has become nonsensical. She's been unable to swallow as well. CT scan of her head showed acute left sellar and frontal lobe MCA territory ischemic infarction. Old right frontal infarction was also noted. A she has been taking aspirin daily.  LSN: 10 PM on 05/20/2015 tPA Given: No: Beyond time under for treatment consideration  . She was admitted to the neuro floorbed for further evaluation and treatment.  SUBJECTIVE Her family is not at the bedside.  Overall she feels her condition is unchanged.    OBJECTIVE Most recent Vital Signs: Filed Vitals:   05/21/15 2200 05/22/15 0000 05/22/15 0330 05/22/15 0530  BP: 160/71 156/67 160/88 167/70  Pulse: 86 82 90 98  Temp: 98.3 F (36.8 C) 98.2 F (36.8 C) 99.2 F (37.3 C) 98.7 F (37.1 C)  TempSrc: Oral Oral Oral Oral  Resp: 19 18 18 16   Weight:      SpO2: 98% 99% 98% 100%   CBG (last 3)   Recent Labs  05/21/15 1509  GLUCAP 67    IV Fluid Intake:   . sodium chloride      MEDICATIONS  .  stroke: mapping our early stages of recovery book   Does not apply Once  . aspirin  300 mg Rectal Daily  . atorvastatin  20 mg Oral q1800  . enoxaparin (LOVENOX) injection  40 mg Subcutaneous Q24H   PRN:  LORazepam, LORazepam  Diet:  DIET - DYS 1 Room service appropriate?: Yes; Fluid consistency:: Thin  liquids Activity:  Bedrest  DVT Prophylaxis: SCDs  CLINICALLY SIGNIFICANT STUDIES Basic Metabolic Panel:  Recent Labs Lab 05/21/15 1507 05/21/15 1539  NA 140 143  K 3.8  3.9  CL 107 107  CO2 22  --   GLUCOSE 96 93  BUN 16 20  CREATININE 1.55* 1.60*  CALCIUM 9.9  --    Liver Function Tests:  Recent Labs Lab 05/21/15 1507  AST 23  ALT 12*  ALKPHOS 58  BILITOT 0.6  PROT 7.3  ALBUMIN 3.7   CBC:  Recent Labs Lab 05/21/15 1507 05/21/15 1539  WBC 5.1  --   NEUTROABS 2.9  --   HGB 12.4 13.9  HCT 38.3 41.0  MCV 83.4  --   PLT 239  --    Coagulation:  Recent Labs Lab 05/21/15 1507  LABPROT 14.5  INR 1.11   Cardiac Enzymes: No results for input(s): CKTOTAL, CKMB, CKMBINDEX, TROPONINI in the last 168 hours. Urinalysis: No results for input(s): COLORURINE, LABSPEC, PHURINE, GLUCOSEU, HGBUR, BILIRUBINUR, KETONESUR, PROTEINUR, UROBILINOGEN, NITRITE, LEUKOCYTESUR in the last 168 hours.  Invalid input(s): APPERANCEUR Lipid Panel    Component Value Date/Time   CHOL 248* 05/22/2015 0515   TRIG 75 05/22/2015 0515   HDL 46 05/22/2015 0515   CHOLHDL 5.4 05/22/2015 0515   VLDL 15 05/22/2015 0515   LDLCALC 187* 05/22/2015 0515   HgbA1C No results found for: HGBA1C  Urine Drug Screen:  No results found for: LABOPIA, COCAINSCRNUR, LABBENZ, AMPHETMU, THCU, LABBARB  Alcohol Level: No results  for input(s): ETH in the last 168 hours.  Ct Head Wo Contrast  05/21/2015   CLINICAL DATA:  81 year old female with a history of acute speech disorder.  EXAM: CT HEAD WITHOUT CONTRAST  TECHNIQUE: Contiguous axial images were obtained from the base of the skull through the vertex without intravenous contrast.  COMPARISON:  02/06/2009, 05/23/2013  FINDINGS: Loss of gray-white differentiation of the left anterior insula and within the left frontal low.  No acute intracranial hemorrhage.  No midline shift or mass effect.  Hypodensity of the right frontal region is new from the comparison CT, though appears completed/remote.  Hypodense focus of the right cerebellar hemisphere, new from the comparison CT.  Unremarkable configuration of the ventricles.  Intracranial  atherosclerosis.  Unremarkable appearance of the calvarium without acute fracture or aggressive lesion.  Unremarkable appearance of the scalp soft tissues.  Unremarkable appearance of the bilateral orbits.  .  Mastoid air cells are clear.  No significant paranasal sinus disease  IMPRESSION: Acute ischemia of the left anterior insula and left frontal lobe in the territory of the left MCA. Aspects score of 7.  Encephalomalacia with completed infarction of the right frontal lobe, new from the CT dated 05/23/2013. There is also a new focal infarction of the right cerebellar hemisphere, age indeterminate.  No evidence of intracranial hemorrhage.  Intracranial atherosclerosis.  These results were called by telephone at the time of interpretation on 05/21/2015 at 3:54 pm to Dr. Blane Ohara , who verbally acknowledged these results.  Signed,  Yvone Neu. Loreta Ave, DO  [facility]  [facility]   Electronically Signed   By: Gilmer Mor D.O.   On: 05/21/2015 15:56   Mr Brain Wo Contrast  05/22/2015   CLINICAL DATA:  Slurred speech, dysphagia, confusion. History of hypertension, dementia, hyperlipidemia.  EXAM: MRI HEAD WITHOUT CONTRAST  MRA HEAD WITHOUT CONTRAST  TECHNIQUE: Multiplanar, multiecho pulse sequences of the brain and surrounding structures were obtained without intravenous contrast. Angiographic images of the head were obtained using MRA technique without contrast.  COMPARISON:  CT head May 21, 2015  FINDINGS: Motion degraded examination, per technologist note, patient was confused, and could not remain still.  MRI HEAD FINDINGS  Subcentimeter focus of reduced diffusion RIGHT cerebellum. Reduced diffusion LEFT frontal lobe/insula with corresponding low ADC values corresponding to CT abnormality. No susceptibility artifact to suggest hemorrhage. Ventricles and sulci are normal for patient's age. Old small cerebellar infarcts. RIGHT inferior basal ganglia  perivascular space. Multiple tiny perivascular spaces throughout the cerebrum. Minimal white matter changes compatible chronic small vessel ischemic disease, which may be under appreciated due to patient motion. RIGHT frontal encephalomalacia extending to the insula.  No abnormal extra-axial fluid collections. Status post bilateral ocular lens implants. Mild paranasal sinus mucosal thickening. Possible polyp LEFT posterior nasal turbinate. Trace LEFT mastoid effusion. No abnormal sellar expansion. No cerebellar tonsillar ectopia. No suspicious calvarial bone marrow signal.  MRA HEAD FINDINGS  Moderate to severely motion degraded examination.  Flow related enhancement within the bilateral internal carotid arteries, bilateral anterior cerebral arteries. Flow related enhancement of the bilateral middle cerebral arteries.  Bilateral vertebral arteries are patent, basilar artery is patent. Small LEFT posterior communicating artery is present. Bilateral posterior cerebral arteries are patent.  IMPRESSION: MRI HEAD: Moderately motion degraded examination.  Acute LEFT MCA territory infarct predominately involving the insula.  Acute small RIGHT cerebellar infarct.  Chronic changes including old RIGHT MCA territory infarct.  MRA HEAD: Moderate to severely motion degraded examination  without acute large vessel occlusion.   Electronically Signed   By: Awilda Metro M.D.   On: 05/22/2015 03:39   Mr Maxine Glenn Head/brain Wo Cm  05/22/2015   CLINICAL DATA:  Slurred speech, dysphagia, confusion. History of hypertension, dementia, hyperlipidemia.  EXAM: MRI HEAD WITHOUT CONTRAST  MRA HEAD WITHOUT CONTRAST  TECHNIQUE: Multiplanar, multiecho pulse sequences of the brain and surrounding structures were obtained without intravenous contrast. Angiographic images of the head were obtained using MRA technique without contrast.  COMPARISON:  CT head May 21, 2015  FINDINGS: Motion degraded examination, per technologist note, patient  was confused, and could not remain still.  MRI HEAD FINDINGS  Subcentimeter focus of reduced diffusion RIGHT cerebellum. Reduced diffusion LEFT frontal lobe/insula with corresponding low ADC values corresponding to CT abnormality. No susceptibility artifact to suggest hemorrhage. Ventricles and sulci are normal for patient's age. Old small cerebellar infarcts. RIGHT inferior basal ganglia perivascular space. Multiple tiny perivascular spaces throughout the cerebrum. Minimal white matter changes compatible chronic small vessel ischemic disease, which may be under appreciated due to patient motion. RIGHT frontal encephalomalacia extending to the insula.  No abnormal extra-axial fluid collections. Status post bilateral ocular lens implants. Mild paranasal sinus mucosal thickening. Possible polyp LEFT posterior nasal turbinate. Trace LEFT mastoid effusion. No abnormal sellar expansion. No cerebellar tonsillar ectopia. No suspicious calvarial bone marrow signal.  MRA HEAD FINDINGS  Moderate to severely motion degraded examination.  Flow related enhancement within the bilateral internal carotid arteries, bilateral anterior cerebral arteries. Flow related enhancement of the bilateral middle cerebral arteries.  Bilateral vertebral arteries are patent, basilar artery is patent. Small LEFT posterior communicating artery is present. Bilateral posterior cerebral arteries are patent.  IMPRESSION: MRI HEAD: Moderately motion degraded examination.  Acute LEFT MCA territory infarct predominately involving the insula.  Acute small RIGHT cerebellar infarct.  Chronic changes including old RIGHT MCA territory infarct.  MRA HEAD: Moderate to severely motion degraded examination without acute large vessel occlusion.   Electronically Signed   By: Awilda Metro M.D.   On: 05/22/2015 03:39       Carotid Doppler  Mild thickening of walls no significant stenosis  2D Echocardiogram  Left ventricle: The cavity size was normal. Wall  thickness was normal. Systolic function was normal. The estimated ejection fraction was in the range of 50% to 55%. There is akinesis of the basalinferolateral myocardium.   CXR  pending  EKG   Sinus rhythm with marked sinus arrhythmia with short PR Otherwise normal ECG  Therapy Recommendations  pending  Physical Exam   frail elderly African-American lady sitting up comfortably note bedside chair. . Afebrile. Head is nontraumatic. Neck is supple without bruit.    Cardiac exam no murmur or gallop. Lungs are clear to auscultation. Distal pulses are well felt. Neurological Exam : Awake alert oriented to person only. Diminished attention, registration and recall. Speech is slightly nonfluent with some word hesitancy and disfluency. Follows simple midline and one-step commands only. No dysarthria. Extraocular moments are full range without nystagmus. Blinks to threat bilaterally. Fundi were not visualized. Vision acuity cannot be reliably tested. Mild right lower facial asymmetry when she smiles. Tongue midline. Motor system exam symmetric and equal strength against gravity in all 4 extremities. Fine finger movements appear symmetric. Deep tendon reflexes are 1+ symmetric. States touch and pinprick is symmetric and equal bilaterally Plantars are downgoing. Gait was not tested. ASSESSMENT Ariel Douglas is a 81 y.o. female presenting with New onset speech difficulty, right facial  droop and drooling  . Infarct felt to be  embolic secondary to unknown source.  On aspirin 81 mg orally every day prior to admission. Now on aspirin 300 mg rectal suppository for secondary stroke prevention. Patient with resultant  Mild aphasia Stroke work up underway.    Hypertension  LDL 187  H/o TIA   Hospital day # 1  TREATMENT/PLAN  Continue aspirin for secondary stroke prevention. Patient remains at risk for neurological worsening, recurrent stroke, TIA needs ongoing stroke evaluation and aggressive  risk factor control. Given patient's advanced age and comorbidities need to discuss with family how aggressive to be for stroke workup. I do not believe she is a good candidate for long-term anticoagulation and hence we will not consider TEE and loop recorder.  Mobilize out of bed. Therapy consults.       Delia Heady, MD   To contact Stroke Continuity provider, please refer to [email or web address]. After hours, contact General Neurology

## 2015-05-22 NOTE — Care Management Note (Signed)
Case Management Note  Patient Details  Name: FRANNY SELVAGE MRN: [id number] Date of Birth: 11/19/1918  Subjective/Objective:                    Action/Plan: Patient admitted with CVA. Pt lives at home with family. CM will continue to follow for discharge needs.   Expected Discharge Date:                  Expected Discharge Plan:  Home/Self Care  In-House Referral:     Discharge planning Services     Post Acute Care Choice:    Choice offered to:     DME Arranged:    DME Agency:     HH Arranged:    HH Agency:     Status of Service:  In process, will continue to follow  Medicare Important Message Given:    Date Medicare IM Given:    Medicare IM give by:    Date Additional Medicare IM Given:    Additional Medicare Important Message give by:     If discussed at Long Length of Stay Meetings, dates discussed:    Additional Comments:  Florene Glen, RN 05/22/2015, 11:53 AM

## 2015-05-22 NOTE — Progress Notes (Signed)
PRELIMINARY RESULTS BY TECH - Carotid Duplex Completed. Mild intimal thickening in both carotid arteries with no evidence of stenosis noted. Bilateral vertebral arteries demonstrate normal antegrade flow.Ariel Douglas, BS, RDMS, RVT.Ariel Douglas, BS, RDMS, RVT

## 2015-05-23 DIAGNOSIS — I10 Essential (primary) hypertension: Secondary | ICD-10-CM

## 2015-05-23 LAB — HEMOGLOBIN A1C
Hgb A1c MFr Bld: 5.8 % — ABNORMAL HIGH (ref 4.8–5.6)
Mean Plasma Glucose: 120 mg/dL

## 2015-05-23 MED ORDER — LORAZEPAM 0.5 MG PO TABS: 0.2500 mg | ORAL_TABLET | ORAL | Status: DC | PRN

## 2015-05-23 MED ORDER — ASPIRIN 325 MG PO TABS
325.0000 mg | ORAL_TABLET | Freq: Every day | ORAL | Status: DC
  Administered 2015-05-23 – 2015-05-24 (×2): 325 mg via ORAL
  Filled 2015-05-23 (×2): qty 1

## 2015-05-23 NOTE — Clinical Social Work Note (Signed)
CSW met the pt's daughter Wayne Sever at the bedside. CSW provided bed offers. [name] has accepted the bed at [facility].   [place], MSW, [place]

## 2015-05-23 NOTE — Progress Notes (Signed)
I met with pt at bedside and then contacted her daughter by phone, Sherlie. I do not have a bed available in inpt rehab today and daughter is aware. Daughter then prefers SNF locally and will arrive today around 1330. I will contact SW and make her aware of no bed available for inpt rehab admission and dtr prefers SNF at this time. I will notify RN CM and SW. [phone]

## 2015-05-23 NOTE — Progress Notes (Signed)
Physical Therapy Treatment Patient Details Name: Ariel Douglas MRN: [id number] DOB: [birth year] Today's Date: 05/23/2015    History of Present Illness Ariel Douglas is an 81 y.o. female with a history of hypertension, hyperlipidemia, coronary artery disease, TIA and dementia, presenting with new onset speech abnormality as well as right facial droop and drooling. Pt with L anterior MCA infarct    PT Comments    Pt con't to present with bilat LE weakness and cognitive deficits. Pt remains to require minA for safe ambulation due to increased falls risk. Pt to con't to benefit from CIR upon d/c as pt was functioning as safe mod I level PTA. Anticipate pt to do well in CIR and be able to achieve safe mod I.  Follow Up Recommendations  CIR;Supervision/Assistance - 24 hour     Equipment Recommendations  None recommended by PT    Recommendations for Other Services Rehab consult     Precautions / Restrictions Precautions Precautions: Fall Precaution Comments: dementia Restrictions Weight Bearing Restrictions: No    Mobility  Bed Mobility Overal bed mobility: Needs Assistance Bed Mobility: Supine to Sit     Supine to sit: Min assist     General bed mobility comments: pt initiated transfer, required assist for trunk elevation and to scoot to eob  Transfers Overall transfer level: Needs assistance Equipment used: 1 person hand held assist Transfers: Sit to/from Stand Sit to Stand: Min assist         General transfer comment: v/c's for safe hand placement, unsteady upon initial stand but used UEs to hold onto bed rail and PTs hand  Ambulation/Gait Ambulation/Gait assistance: Min assist Ambulation Distance (Feet): 120 Feet Assistive device: 1 person hand held assist Gait Pattern/deviations: Step-through pattern;Decreased stride length;Shuffle Gait velocity: slow Gait velocity interpretation: <1.8 ft/sec, indicative of risk for recurrent falls General Gait Details:  attempted to ambulate without HHA however pt unsteady with cross over gait to maitnain balance. pt staggering left/right. pt requires minA for HHA to maintain balance   Stairs Stairs: Yes Stairs assistance: Mod assist Stair Management: One rail Right (HHA on L) Number of Stairs: 12 General stair comments: pt unable to follow commands to complete step 2 pattern.. pt con't to try alternating but required modA to maintain balance due to LE weakness  Wheelchair Mobility    Modified Rankin (Stroke Patients Only) Modified Rankin (Stroke Patients Only) Pre-Morbid Rankin Score: No significant disability Modified Rankin: Moderately severe disability     Balance Overall balance assessment: Needs assistance         Standing balance support: During functional activity;No upper extremity supported Standing balance-Leahy Scale: Fair Standing balance comment: pt stood at sink to wash hands but had to lean against sink to maintain balance                    Cognition Arousal/Alertness: Awake/alert Behavior During Therapy: WFL for tasks assessed/performed Overall Cognitive Status: History of cognitive impairments - at baseline                      Exercises      General Comments General comments (skin integrity, edema, etc.): pt assisted to bathroom. pt with BM but only performed pericare. required assist for hygiene from [facility]      Pertinent Vitals/Pain Pain Assessment: No/denies pain    Home Living                      Prior  Function            PT Goals (current goals can now be found in the care plan section) Acute Rehab PT Goals Patient Stated Goal: return to [facility] Progress towards PT goals: Progressing toward goals    Frequency  Min 4X/week    PT Plan Current plan remains appropriate    Co-evaluation             End of Session Equipment Utilized During Treatment: Gait belt Activity Tolerance: Patient tolerated treatment well Patient  left: in chair;with call bell/phone within reach;with chair alarm set;with nursing/sitter in room     Time: 1610-9604 PT Time Calculation (min) (ACUTE ONLY): 20 min  Charges:  $Gait Training: 8-22 mins                    G Codes:      Marcene Brawn 05/23/2015, 12:33 PM   Lewis Shock, PT, DPT Pager #: [phone] Office #: [phone]

## 2015-05-23 NOTE — Clinical Social Work Note (Signed)
Clinical Social Work Assessment  Patient Details  Name: Ariel Douglas MRN: [id number] Date of Birth: Feb 04, 1919  Date of referral:  05/23/15               Reason for consult:  Facility Placement                Permission sought to share information with:  Family Supports  Housing/Transportation Living arrangements for the past 2 months:  Single Family Home Source of Information:  Adult Children Patient Interpreter Needed:  None Criminal Activity/Legal Involvement Pertinent to Current Situation/Hospitalization:  No - Comment as needed Significant Relationships:  Adult Children Lives with:  Self Do you feel safe going back to the place where you live?  No Need for family participation in patient care:  Yes (Comment)  Care giving concerns:  The pt's daughter Ariel Douglas reported that she can not take care of the pt at home.    Social Worker assessment / plan: CSW spoke with the pt's daughter Ariel Douglas. CSW introduced self and purpose of the call. CSW discussed SNF rehab. CSW explained the SNF process. CSW explained insurance and its relation to SNF placement. Ariel Douglas reported that she will be arriving at the hospital at 130pm CSW will provided [facility] with a SNF list at that time. CSW answered all questions in which the Ariel Douglas inquired about. CSW will continue to follow this pt and assist with discharge as needed.    Employment status:  Retired Database administrator PT Recommendations:  Skilled Nursing Facility Information / Referral to community resources:  Skilled Nursing Facility  Patient/Family's Response to care:  Ariel Douglas reported that the care the pt received has been well.   Patient/Family's Understanding of and Emotional Response to Diagnosis, Current Treatment, and Prognosis:  Ariel Douglas reported understanding the pt's condition. Ariel Douglas acknowledged that she can not care for the pt at home. Ariel Douglas is in an agreement for SNF placement.   Emotional  Assessment Appearance:  Appears stated age Attitude/Demeanor/Rapport:  Unable to Assess Affect (typically observed):  Unable to Assess Orientation:  Oriented to Place Alcohol / Substance use:  Not Applicable Psych involvement (Current and /or in the community):  No (Comment)  Discharge Needs  Concerns to be addressed:  Denies Needs/Concerns at this time Readmission within the last 30 days:  No Current discharge risk:  None Barriers to Discharge:  No Barriers Identified   Bibbs, [name], LCSW 05/23/2015, 12:59 PM

## 2015-05-23 NOTE — Discharge Summary (Signed)
Physician Discharge Summary  Ariel Douglas:[id number] DOB: 09-29-1918 DOA: 05/21/2015  PCP: [name], MD  Admit date: 05/21/2015 Discharge date: 05/24/2015  Time spent: 35 minutes  Recommendations for Outpatient Follow-up:  1. To SNF  Discharge Diagnoses:  Principal Problem:   CVA (cerebral infarction) Active Problems:   Chronic kidney disease, stage 3   Dementia   Hypertension   Acute CVA (cerebrovascular accident)   Discharge Condition: improved  Diet recommendation: cardiac  Filed Weights   05/21/15 2005 05/22/15 1500  Weight: 55.8 kg (123 lb 0.3 oz) 55.8 kg (123 lb 0.3 oz)    History of present illness:  Ariel Douglas is a 81 y.o. female with a history of hypertension, TIA in the past, coronary artery disease, dementia and hyperlipidemia was brought into the hospital by her daughter for slurred speech. The daughter went to church this morning. She said goodbye to her mother who nodded and said "mmm-hmm" morning. When she returned from work in the afternoon she noted that her mother had slurred speech. The patient tried to eat but according to the daughter she was not able to swallow food. She was drooling. She appeared slightly confused. In the ER she continues to have nonsensical speech. She is able to say short words like "thank you" and he "yes" but unable to speak long sentences. CT of the head shows acute ischemia in the left MCA territory  Hospital Course:  CVA (cerebral infarction): Acute LEFT MCA territory infarct predominately involving the insula and Acute small RIGHT cerebellar infarct. - Dysphagia 1 diet - ASA -MRI/MRA +, carotid Dopplers, 2-D echo, lipid panel- LDL elevated and A1c 5.8 Added lipid  Chronic kidney disease, stage 3 -Stable   Dementia -Hold Namenda for now -ativan PRN   Hypertension -Hold atenolol and allow for permissive hypertension  Procedures:    Consultations:  neuro  Discharge Exam: Filed Vitals:   05/24/15 0524   BP: 160/73  Pulse: 106  Temp: 98.6 F (37 C)  Resp: 18    General: pleasant/cooperative   Discharge Instructions   Discharge Instructions    Discharge instructions    Complete by:  As directed   No intubation, CPR ok, Bipap ok DYS 1 diet     Increase activity slowly    Complete by:  As directed           Current Discharge Medication List    START taking these medications   Details  aspirin 325 MG tablet Take 1 tablet (325 mg total) by mouth daily.    atorvastatin (LIPITOR) 20 MG tablet Take 1 tablet (20 mg total) by mouth daily at 6 PM.      CONTINUE these medications which have CHANGED   Details  LORazepam (ATIVAN) 1 MG tablet Take 0.5-1 tablets (0.5-1 mg total) by mouth every 8 (eight) hours as needed for anxiety. Take 1/2 tablet (0.5 mg) by mouth every morning and 1 tablet (1 mg) at bedtime Qty: 10 tablet, Refills: 0      CONTINUE these medications which have NOT CHANGED   Details  atenolol (TENORMIN) 25 MG tablet Take 12.5 mg by mouth daily.    beta carotene w/minerals (OCUVITE) tablet Take 1 tablet by mouth daily.    bismuth subsalicylate (PEPTO BISMOL) 262 MG/15ML suspension Take 30 mLs by mouth daily as needed for indigestion.     Calcium Citrate-Vitamin D (CALCIUM CITRATE + D3 PO) Take 1 tablet by mouth 2 (two) times daily. Per tablet: calcium citrate 200 mg,  vitamin D3 250 i.u.    cholecalciferol (VITAMIN D) 1000 UNITS tablet Take 1,000 Units by mouth 2 (two) times daily.    l-methylfolate-b2-b6-b12 (CEREFOLIN) 01-31-49-5 MG TABS Take 1 tablet by mouth daily.    memantine (NAMENDA) 10 MG tablet Take 10 mg by mouth 2 (two) times daily.    Omega-3 Fatty Acids (FISH OIL) 1200 MG CAPS Take 1,200 mg by mouth daily.      STOP taking these medications     aspirin (ASPIRIN EC) 81 MG EC tablet        Allergies  Allergen Reactions  . Penicillins Swelling    Facial and hand swelling per daughter Has patient had a PCN reaction causing immediate rash,  facial/tongue/throat swelling, SOB or lightheadedness with hypotension: Yes Has patient had a PCN reaction causing severe rash involving mucus membranes or skin necrosis: No Has patient had a PCN reaction that required hospitalization No Has patient had a PCN reaction occurring within the last 10 years: No If all of the above answers are "NO", then may proceed with Cephalosporin use.   Follow-up Information    Follow up with [name], MD In 1 week.   Specialty:  Internal Medicine   Contact information:   [address] [place] [address] [phone]       Follow up with SETHI,PRAMOD, MD In 2 months.   Specialties:  Neurology, Radiology   Contact information:   [address] [place] [address] [phone]        The results of significant diagnostics from this hospitalization (including imaging, microbiology, ancillary and laboratory) are listed below for reference.    Significant Diagnostic Studies: Ct Head Wo Contrast  05/21/2015   CLINICAL DATA:  81 year old female with a history of acute speech disorder.  EXAM: CT HEAD WITHOUT CONTRAST  TECHNIQUE: Contiguous axial images were obtained from the base of the skull through the vertex without intravenous contrast.  COMPARISON:  02/06/2009, 05/23/2013  FINDINGS: Loss of gray-white differentiation of the left anterior insula and within the left frontal low.  No acute intracranial hemorrhage.  No midline shift or mass effect.  Hypodensity of the right frontal region is new from the comparison CT, though appears completed/remote.  Hypodense focus of the right cerebellar hemisphere, new from the comparison CT.  Unremarkable configuration of the ventricles.  Intracranial atherosclerosis.  Unremarkable appearance of the calvarium without acute fracture or aggressive lesion.  Unremarkable appearance of the scalp soft tissues.  Unremarkable appearance of the bilateral orbits.  .  Mastoid air cells are clear.   No significant paranasal sinus disease  IMPRESSION: Acute ischemia of the left anterior insula and left frontal lobe in the territory of the left MCA. Aspects score of 7.  Encephalomalacia with completed infarction of the right frontal lobe, new from the CT dated 05/23/2013. There is also a new focal infarction of the right cerebellar hemisphere, age indeterminate.  No evidence of intracranial hemorrhage.  Intracranial atherosclerosis.  These results were called by telephone at the time of interpretation on 05/21/2015 at 3:54 pm to Dr. Blane Ohara , who verbally acknowledged these results.  Signed,  Yvone Neu. Loreta Ave, DO  [facility]  [facility]   Electronically Signed   By: Gilmer Mor D.O.   On: 05/21/2015 15:56   Mr Brain Wo Contrast  05/22/2015   CLINICAL DATA:  Slurred speech, dysphagia, confusion. History of hypertension, dementia, hyperlipidemia.  EXAM: MRI HEAD WITHOUT CONTRAST  MRA  HEAD WITHOUT CONTRAST  TECHNIQUE: Multiplanar, multiecho pulse sequences of the brain and surrounding structures were obtained without intravenous contrast. Angiographic images of the head were obtained using MRA technique without contrast.  COMPARISON:  CT head May 21, 2015  FINDINGS: Motion degraded examination, per technologist note, patient was confused, and could not remain still.  MRI HEAD FINDINGS  Subcentimeter focus of reduced diffusion RIGHT cerebellum. Reduced diffusion LEFT frontal lobe/insula with corresponding low ADC values corresponding to CT abnormality. No susceptibility artifact to suggest hemorrhage. Ventricles and sulci are normal for patient's age. Old small cerebellar infarcts. RIGHT inferior basal ganglia perivascular space. Multiple tiny perivascular spaces throughout the cerebrum. Minimal white matter changes compatible chronic small vessel ischemic disease, which may be under appreciated due to patient motion. RIGHT frontal encephalomalacia  extending to the insula.  No abnormal extra-axial fluid collections. Status post bilateral ocular lens implants. Mild paranasal sinus mucosal thickening. Possible polyp LEFT posterior nasal turbinate. Trace LEFT mastoid effusion. No abnormal sellar expansion. No cerebellar tonsillar ectopia. No suspicious calvarial bone marrow signal.  MRA HEAD FINDINGS  Moderate to severely motion degraded examination.  Flow related enhancement within the bilateral internal carotid arteries, bilateral anterior cerebral arteries. Flow related enhancement of the bilateral middle cerebral arteries.  Bilateral vertebral arteries are patent, basilar artery is patent. Small LEFT posterior communicating artery is present. Bilateral posterior cerebral arteries are patent.  IMPRESSION: MRI HEAD: Moderately motion degraded examination.  Acute LEFT MCA territory infarct predominately involving the insula.  Acute small RIGHT cerebellar infarct.  Chronic changes including old RIGHT MCA territory infarct.  MRA HEAD: Moderate to severely motion degraded examination without acute large vessel occlusion.   Electronically Signed   By: Awilda Metro M.D.   On: 05/22/2015 03:39   Mr Maxine Glenn Head/brain Wo Cm  05/22/2015   CLINICAL DATA:  Slurred speech, dysphagia, confusion. History of hypertension, dementia, hyperlipidemia.  EXAM: MRI HEAD WITHOUT CONTRAST  MRA HEAD WITHOUT CONTRAST  TECHNIQUE: Multiplanar, multiecho pulse sequences of the brain and surrounding structures were obtained without intravenous contrast. Angiographic images of the head were obtained using MRA technique without contrast.  COMPARISON:  CT head May 21, 2015  FINDINGS: Motion degraded examination, per technologist note, patient was confused, and could not remain still.  MRI HEAD FINDINGS  Subcentimeter focus of reduced diffusion RIGHT cerebellum. Reduced diffusion LEFT frontal lobe/insula with corresponding low ADC values corresponding to CT abnormality. No  susceptibility artifact to suggest hemorrhage. Ventricles and sulci are normal for patient's age. Old small cerebellar infarcts. RIGHT inferior basal ganglia perivascular space. Multiple tiny perivascular spaces throughout the cerebrum. Minimal white matter changes compatible chronic small vessel ischemic disease, which may be under appreciated due to patient motion. RIGHT frontal encephalomalacia extending to the insula.  No abnormal extra-axial fluid collections. Status post bilateral ocular lens implants. Mild paranasal sinus mucosal thickening. Possible polyp LEFT posterior nasal turbinate. Trace LEFT mastoid effusion. No abnormal sellar expansion. No cerebellar tonsillar ectopia. No suspicious calvarial bone marrow signal.  MRA HEAD FINDINGS  Moderate to severely motion degraded examination.  Flow related enhancement within the bilateral internal carotid arteries, bilateral anterior cerebral arteries. Flow related enhancement of the bilateral middle cerebral arteries.  Bilateral vertebral arteries are patent, basilar artery is patent. Small LEFT posterior communicating artery is present. Bilateral posterior cerebral arteries are patent.  IMPRESSION: MRI HEAD: Moderately motion degraded examination.  Acute LEFT MCA territory infarct predominately involving the insula.  Acute small RIGHT cerebellar infarct.  Chronic changes including old RIGHT  MCA territory infarct.  MRA HEAD: Moderate to severely motion degraded examination without acute large vessel occlusion.   Electronically Signed   By: Awilda Metro M.D.   On: 05/22/2015 03:39    Microbiology: No results found for this or any previous visit (from the past 240 hour(s)).   Labs: Basic Metabolic Panel:  Recent Labs Lab 05/21/15 1507 05/21/15 1539  NA 140 143  K 3.8 3.9  CL 107 107  CO2 22  --   GLUCOSE 96 93  BUN 16 20  CREATININE 1.55* 1.60*  CALCIUM 9.9  --    Liver Function Tests:  Recent Labs Lab 05/21/15 1507  AST 23  ALT  12*  ALKPHOS 58  BILITOT 0.6  PROT 7.3  ALBUMIN 3.7   No results for input(s): LIPASE, AMYLASE in the last 168 hours. No results for input(s): AMMONIA in the last 168 hours. CBC:  Recent Labs Lab 05/21/15 1507 05/21/15 1539  WBC 5.1  --   NEUTROABS 2.9  --   HGB 12.4 13.9  HCT 38.3 41.0  MCV 83.4  --   PLT 239  --    Cardiac Enzymes: No results for input(s): CKTOTAL, CKMB, CKMBINDEX, TROPONINI in the last 168 hours. BNP: BNP (last 3 results) No results for input(s): BNP in the last 8760 hours.  ProBNP (last 3 results) No results for input(s): PROBNP in the last 8760 hours.  CBG:  Recent Labs Lab 05/21/15 1509  GLUCAP 67       Signed:  VANN, JESSICA  [facility] 05/24/2015, 7:47 AM

## 2015-05-23 NOTE — Clinical Social Work Placement (Signed)
   CLINICAL SOCIAL WORK PLACEMENT  NOTE  Date:  05/23/2015  Patient Details  Name: Ariel Douglas MRN: [id number] Date of Birth: [birth year]  Clinical Social Work is seeking post-discharge placement for this patient at the Skilled  Nursing Facility level of care (*CSW will initial, date and re-position this form in  chart as items are completed):  Yes   Patient/family provided with [facility] Social Work Department's list of facilities offering this level of care within the geographic area requested by the patient (or if unable, by the patient's family).  Yes   Patient/family informed of their freedom to choose among providers that offer the needed level of care, that participate in Medicare, Medicaid or managed care program needed by the patient, have an available bed and are willing to accept the patient.  Yes   Patient/family informed of 's ownership interest in [facility] and [facility], as well as of the fact that they are under no obligation to receive care at these facilities.  PASRR submitted to EDS on 05/23/15     PASRR number received on 05/23/15     Existing PASRR number confirmed on       FL2 transmitted to all facilities in geographic area requested by pt/family on       FL2 transmitted to all facilities within larger geographic area on       Patient informed that his/her managed care company has contracts with or will negotiate with certain facilities, including the following:            Patient/family informed of bed offers received.  Patient chooses bed at       Physician recommends and patient chooses bed at      Patient to be transferred to   on  .  Patient to be transferred to facility by       Patient family notified on   of transfer.  Name of family member notified:        PHYSICIAN       Additional Comment:    _______________________________________________ Gwynne Edinger, LCSW 05/23/2015, 1:03 PM

## 2015-05-23 NOTE — Progress Notes (Signed)
PROGRESS NOTE  Ariel Douglas:[id number] DOB: 07/08/1919 DOA: 05/21/2015 PCP: [name], MD  Assessment/Plan: CVA (cerebral infarction): Acute LEFT MCA territory infarct predominately involving the insula and Acute small RIGHT cerebellar infarct. -  Dysphagia 1 diet - ASA -Nothing by mouth-IV fluids while nothing by mouth -Speech, physical therapy and occupational therapy eval- CIR??? -MRI/MRA +, carotid Dopplers, 2-D echo, lipid panel- LDL elevated and A1c 5.8 Lipid when able to take PO   Chronic kidney disease, stage 3 -Stable   Dementia -Hold Namenda for now -ativan PRN   Hypertension -Hold atenolol and allow for permissive hypertension  Code Status: partial Family Communication: patient Disposition Plan: CIR today??   Consultants:  neuro  Procedures:    HPI/Subjective: Doing better  Objective: Filed Vitals:   05/23/15 0511  BP: 154/68  Pulse: 80  Temp: 98.5 F (36.9 C)  Resp: 20   No intake or output data in the 24 hours ending 05/23/15 0806 Filed Weights   05/21/15 2005 05/22/15 1500  Weight: 55.8 kg (123 lb 0.3 oz) 55.8 kg (123 lb 0.3 oz)    Exam:   General:  awake  Cardiovascular: rrr  Respiratory: clear  Abdomen: +BS, soft  Musculoskeletal: follows commands  Data Reviewed: Basic Metabolic Panel:  Recent Labs Lab 05/21/15 1507 05/21/15 1539  NA 140 143  K 3.8 3.9  CL 107 107  CO2 22  --   GLUCOSE 96 93  BUN 16 20  CREATININE 1.55* 1.60*  CALCIUM 9.9  --    Liver Function Tests:  Recent Labs Lab 05/21/15 1507  AST 23  ALT 12*  ALKPHOS 58  BILITOT 0.6  PROT 7.3  ALBUMIN 3.7   No results for input(s): LIPASE, AMYLASE in the last 168 hours. No results for input(s): AMMONIA in the last 168 hours. CBC:  Recent Labs Lab 05/21/15 1507 05/21/15 1539  WBC 5.1  --   NEUTROABS 2.9  --   HGB 12.4 13.9  HCT 38.3 41.0  MCV 83.4  --   PLT 239  --    Cardiac Enzymes: No results for input(s): CKTOTAL, CKMB,  CKMBINDEX, TROPONINI in the last 168 hours. BNP (last 3 results) No results for input(s): BNP in the last 8760 hours.  ProBNP (last 3 results) No results for input(s): PROBNP in the last 8760 hours.  CBG:  Recent Labs Lab 05/21/15 1509  GLUCAP 67    No results found for this or any previous visit (from the past 240 hour(s)).   Studies: Ct Head Wo Contrast  05/21/2015   CLINICAL DATA:  81 year old female with a history of acute speech disorder.  EXAM: CT HEAD WITHOUT CONTRAST  TECHNIQUE: Contiguous axial images were obtained from the base of the skull through the vertex without intravenous contrast.  COMPARISON:  02/06/2009, 05/23/2013  FINDINGS: Loss of gray-white differentiation of the left anterior insula and within the left frontal low.  No acute intracranial hemorrhage.  No midline shift or mass effect.  Hypodensity of the right frontal region is new from the comparison CT, though appears completed/remote.  Hypodense focus of the right cerebellar hemisphere, new from the comparison CT.  Unremarkable configuration of the ventricles.  Intracranial atherosclerosis.  Unremarkable appearance of the calvarium without acute fracture or aggressive lesion.  Unremarkable appearance of the scalp soft tissues.  Unremarkable appearance of the bilateral orbits.  .  Mastoid air cells are clear.  No significant paranasal sinus disease  IMPRESSION: Acute ischemia of the left anterior insula and left  frontal lobe in the territory of the left MCA. Aspects score of 7.  Encephalomalacia with completed infarction of the right frontal lobe, new from the CT dated 05/23/2013. There is also a new focal infarction of the right cerebellar hemisphere, age indeterminate.  No evidence of intracranial hemorrhage.  Intracranial atherosclerosis.  These results were called by telephone at the time of interpretation on 05/21/2015 at 3:54 pm to Dr. Blane Ohara , who verbally acknowledged these results.  Signed,  Yvone Neu. Loreta Ave,  DO  [facility]  [facility]   Electronically Signed   By: Gilmer Mor D.O.   On: 05/21/2015 15:56   Mr Brain Wo Contrast  05/22/2015   CLINICAL DATA:  Slurred speech, dysphagia, confusion. History of hypertension, dementia, hyperlipidemia.  EXAM: MRI HEAD WITHOUT CONTRAST  MRA HEAD WITHOUT CONTRAST  TECHNIQUE: Multiplanar, multiecho pulse sequences of the brain and surrounding structures were obtained without intravenous contrast. Angiographic images of the head were obtained using MRA technique without contrast.  COMPARISON:  CT head May 21, 2015  FINDINGS: Motion degraded examination, per technologist note, patient was confused, and could not remain still.  MRI HEAD FINDINGS  Subcentimeter focus of reduced diffusion RIGHT cerebellum. Reduced diffusion LEFT frontal lobe/insula with corresponding low ADC values corresponding to CT abnormality. No susceptibility artifact to suggest hemorrhage. Ventricles and sulci are normal for patient's age. Old small cerebellar infarcts. RIGHT inferior basal ganglia perivascular space. Multiple tiny perivascular spaces throughout the cerebrum. Minimal white matter changes compatible chronic small vessel ischemic disease, which may be under appreciated due to patient motion. RIGHT frontal encephalomalacia extending to the insula.  No abnormal extra-axial fluid collections. Status post bilateral ocular lens implants. Mild paranasal sinus mucosal thickening. Possible polyp LEFT posterior nasal turbinate. Trace LEFT mastoid effusion. No abnormal sellar expansion. No cerebellar tonsillar ectopia. No suspicious calvarial bone marrow signal.  MRA HEAD FINDINGS  Moderate to severely motion degraded examination.  Flow related enhancement within the bilateral internal carotid arteries, bilateral anterior cerebral arteries. Flow related enhancement of the bilateral middle cerebral arteries.  Bilateral vertebral arteries are patent,  basilar artery is patent. Small LEFT posterior communicating artery is present. Bilateral posterior cerebral arteries are patent.  IMPRESSION: MRI HEAD: Moderately motion degraded examination.  Acute LEFT MCA territory infarct predominately involving the insula.  Acute small RIGHT cerebellar infarct.  Chronic changes including old RIGHT MCA territory infarct.  MRA HEAD: Moderate to severely motion degraded examination without acute large vessel occlusion.   Electronically Signed   By: Awilda Metro M.D.   On: 05/22/2015 03:39   Mr Maxine Glenn Head/brain Wo Cm  05/22/2015   CLINICAL DATA:  Slurred speech, dysphagia, confusion. History of hypertension, dementia, hyperlipidemia.  EXAM: MRI HEAD WITHOUT CONTRAST  MRA HEAD WITHOUT CONTRAST  TECHNIQUE: Multiplanar, multiecho pulse sequences of the brain and surrounding structures were obtained without intravenous contrast. Angiographic images of the head were obtained using MRA technique without contrast.  COMPARISON:  CT head May 21, 2015  FINDINGS: Motion degraded examination, per technologist note, patient was confused, and could not remain still.  MRI HEAD FINDINGS  Subcentimeter focus of reduced diffusion RIGHT cerebellum. Reduced diffusion LEFT frontal lobe/insula with corresponding low ADC values corresponding to CT abnormality. No susceptibility artifact to suggest hemorrhage. Ventricles and sulci are normal for patient's age. Old small cerebellar infarcts. RIGHT inferior basal ganglia perivascular space. Multiple tiny perivascular spaces throughout the cerebrum. Minimal white matter changes compatible chronic small vessel ischemic disease, which may be  under appreciated due to patient motion. RIGHT frontal encephalomalacia extending to the insula.  No abnormal extra-axial fluid collections. Status post bilateral ocular lens implants. Mild paranasal sinus mucosal thickening. Possible polyp LEFT posterior nasal turbinate. Trace LEFT mastoid effusion. No  abnormal sellar expansion. No cerebellar tonsillar ectopia. No suspicious calvarial bone marrow signal.  MRA HEAD FINDINGS  Moderate to severely motion degraded examination.  Flow related enhancement within the bilateral internal carotid arteries, bilateral anterior cerebral arteries. Flow related enhancement of the bilateral middle cerebral arteries.  Bilateral vertebral arteries are patent, basilar artery is patent. Small LEFT posterior communicating artery is present. Bilateral posterior cerebral arteries are patent.  IMPRESSION: MRI HEAD: Moderately motion degraded examination.  Acute LEFT MCA territory infarct predominately involving the insula.  Acute small RIGHT cerebellar infarct.  Chronic changes including old RIGHT MCA territory infarct.  MRA HEAD: Moderate to severely motion degraded examination without acute large vessel occlusion.   Electronically Signed   By: Awilda Metro M.D.   On: 05/22/2015 03:39    Scheduled Meds: .  stroke: mapping our early stages of recovery book   Does not apply Once  . antiseptic oral rinse  7 mL Mouth Rinse BID  . antiseptic oral rinse  7 mL Mouth Rinse q12n4p  . aspirin  300 mg Rectal Daily  . atorvastatin  20 mg Oral q1800  . chlorhexidine  15 mL Mouth Rinse BID  . enoxaparin (LOVENOX) injection  40 mg Subcutaneous Q24H   Continuous Infusions: . sodium chloride     Antibiotics Given (last 72 hours)    None      Principal Problem:   CVA (cerebral infarction) Active Problems:   Chronic kidney disease, stage 3   Dementia   Hypertension   Acute CVA (cerebrovascular accident)    Time spent: 25 min    VANN, JESSICA  [facility] Pager [phone] If 7PM-7AM, please contact night-coverage at [email or web address], password [facility] 05/23/2015, 8:06 AM  LOS: 2 days

## 2015-05-24 DIAGNOSIS — I639 Cerebral infarction, unspecified: Secondary | ICD-10-CM

## 2015-05-24 DIAGNOSIS — R488 Other symbolic dysfunctions: Secondary | ICD-10-CM | POA: Diagnosis not present

## 2015-05-24 DIAGNOSIS — R2681 Unsteadiness on feet: Secondary | ICD-10-CM | POA: Diagnosis not present

## 2015-05-24 DIAGNOSIS — M6281 Muscle weakness (generalized): Secondary | ICD-10-CM | POA: Diagnosis not present

## 2015-05-24 DIAGNOSIS — E559 Vitamin D deficiency, unspecified: Secondary | ICD-10-CM | POA: Diagnosis not present

## 2015-05-24 DIAGNOSIS — I129 Hypertensive chronic kidney disease with stage 1 through stage 4 chronic kidney disease, or unspecified chronic kidney disease: Secondary | ICD-10-CM | POA: Diagnosis not present

## 2015-05-24 DIAGNOSIS — E785 Hyperlipidemia, unspecified: Secondary | ICD-10-CM | POA: Diagnosis not present

## 2015-05-24 DIAGNOSIS — I69391 Dysphagia following cerebral infarction: Secondary | ICD-10-CM | POA: Diagnosis not present

## 2015-05-24 DIAGNOSIS — R1310 Dysphagia, unspecified: Secondary | ICD-10-CM | POA: Diagnosis not present

## 2015-05-24 DIAGNOSIS — F0390 Unspecified dementia without behavioral disturbance: Secondary | ICD-10-CM | POA: Diagnosis not present

## 2015-05-24 DIAGNOSIS — I69320 Aphasia following cerebral infarction: Secondary | ICD-10-CM | POA: Diagnosis not present

## 2015-05-24 DIAGNOSIS — I10 Essential (primary) hypertension: Secondary | ICD-10-CM | POA: Diagnosis not present

## 2015-05-24 DIAGNOSIS — R1312 Dysphagia, oropharyngeal phase: Secondary | ICD-10-CM | POA: Diagnosis not present

## 2015-05-24 DIAGNOSIS — I69921 Dysphasia following unspecified cerebrovascular disease: Secondary | ICD-10-CM | POA: Diagnosis not present

## 2015-05-24 DIAGNOSIS — N183 Chronic kidney disease, stage 3 (moderate): Secondary | ICD-10-CM | POA: Diagnosis not present

## 2015-05-24 MED ORDER — LORAZEPAM 1 MG PO TABS: 0.5000 mg | ORAL_TABLET | Freq: Three times a day (TID) | ORAL | Status: DC | PRN

## 2015-05-24 MED ORDER — ASPIRIN 325 MG PO TABS: 325.0000 mg | ORAL_TABLET | Freq: Every day | ORAL | Status: DC

## 2015-05-24 MED ORDER — ATENOLOL 25 MG PO TABS
12.5000 mg | ORAL_TABLET | Freq: Every day | ORAL | Status: DC
  Administered 2015-05-24: 12.5 mg via ORAL
  Filled 2015-05-24: qty 1

## 2015-05-24 MED ORDER — ATORVASTATIN CALCIUM 20 MG PO TABS: 20.0000 mg | ORAL_TABLET | Freq: Every day | ORAL | Status: DC

## 2015-05-24 NOTE — Progress Notes (Addendum)
Patient ready for discharge to [facility]. IV removed, tele removed. Prescriptions placed in packet for facility. Patient aware of transfer, states her daughter will be coming to move with her.   Addendum: report given to [employer].

## 2015-05-24 NOTE — Clinical Social Work Placement (Signed)
   CLINICAL SOCIAL WORK PLACEMENT  NOTE  Date:  05/24/2015  Patient Details  Name: Ariel Douglas MRN: [id number] Date of Birth: 04-18-1919  Clinical Social Work is seeking post-discharge placement for this patient at the Skilled  Nursing Facility level of care (*CSW will initial, date and re-position this form in  chart as items are completed):  Yes   Patient/family provided with [facility] Clinical Social Work Department's list of facilities offering this level of care within the geographic area requested by the patient (or if unable, by the patient's family).  Yes   Patient/family informed of their freedom to choose among providers that offer the needed level of care, that participate in Medicare, Medicaid or managed care program needed by the patient, have an available bed and are willing to accept the patient.  Yes   Patient/family informed of [facility]'s ownership interest in [facility] and [facility], as well as of the fact that they are under no obligation to receive care at these facilities.  PASRR submitted to EDS on 05/23/15     PASRR number received on 05/23/15     Existing PASRR number confirmed on       FL2 transmitted to all facilities in geographic area requested by pt/family on       FL2 transmitted to all facilities within larger geographic area on       Patient informed that his/her managed care company has contracts with or will negotiate with certain facilities, including the following:        Yes   Patient/family informed of bed offers received.  Patient chooses bed at [facility]     Physician recommends and patient chooses bed at      Patient to be transferred to [facility] on 05/24/15.  Patient to be transferred to facility by [facility]      Patient family notified on 05/24/15 of transfer.  Name of family member notified:  [name], daughter      PHYSICIAN       Additional Comment:     _______________________________________________ Gwynne Edinger, LCSW 05/24/2015, 8:45 AM

## 2015-05-24 NOTE — Progress Notes (Signed)
Stroke Team Progress Note  HISTORY Ariel Douglas is an 81 y.o. female with a history of hypertension, hyperlipidemia, coronary artery disease, TIA and dementia, presenting with new onset speech abnormality as well as right facial droop and drooling. As seen well at 10 PM last night. She was noted this morning to have episodes in speech as well as a facial droop and drooling. Weakness has been noted. Patient can understand what is being said to her that her speech output has become nonsensical. She's been unable to swallow as well. CT scan of her head showed acute left sellar and frontal lobe MCA territory ischemic infarction. Old right frontal infarction was also noted. A she has been taking aspirin daily.  LSN: 10 PM on 05/20/2015 tPA Given: No: Beyond time under for treatment consideration  . She was admitted to the neuro floorbed for further evaluation and treatment.  SUBJECTIVE Her family is not at the bedside.  Overall she feels her condition is unchanged.  She is ready for transfer to [facility] for rehabilitation  OBJECTIVE Most recent Vital Signs: Filed Vitals:   05/23/15 2217 05/24/15 0158 05/24/15 0524 05/24/15 1049  BP: 176/84 172/78 160/73 132/61  Pulse: 104 100 106 85  Temp: 98.2 F (36.8 C) 98.3 F (36.8 C) 98.6 F (37 C) 98.7 F (37.1 C)  TempSrc: Oral Oral Oral Oral  Resp: Height:      Weight:      SpO2: 98% 98% 99% 99%   CBG (last 3)  No results for input(s): GLUCAP in the last 72 hours.  IV Fluid Intake:     MEDICATIONS   PRN:    Diet:     liquids Activity:  Bedrest  DVT Prophylaxis: SCDs  CLINICALLY SIGNIFICANT STUDIES Basic Metabolic Panel:   Recent Labs Lab 05/21/15 1507 05/21/15 1539  NA 140 143  K 3.8 3.9  CL 107 107  CO2 22  --   GLUCOSE 96 93  BUN 16 20  CREATININE 1.55* 1.60*  CALCIUM 9.9  --    Liver Function Tests:   Recent Labs Lab 05/21/15 1507  AST 23  ALT 12*  ALKPHOS 58  BILITOT 0.6  PROT 7.3  ALBUMIN  3.7   CBC:   Recent Labs Lab 05/21/15 1507 05/21/15 1539  WBC 5.1  --   NEUTROABS 2.9  --   HGB 12.4 13.9  HCT 38.3 41.0  MCV 83.4  --   PLT 239  --    Coagulation:   Recent Labs Lab 05/21/15 1507  LABPROT 14.5  INR 1.11   Cardiac Enzymes: No results for input(s): CKTOTAL, CKMB, CKMBINDEX, TROPONINI in the last 168 hours. Urinalysis: No results for input(s): COLORURINE, LABSPEC, PHURINE, GLUCOSEU, HGBUR, BILIRUBINUR, KETONESUR, PROTEINUR, UROBILINOGEN, NITRITE, LEUKOCYTESUR in the last 168 hours.  Invalid input(s): APPERANCEUR Lipid Panel    Component Value Date/Time   CHOL 248* 05/22/2015 0515   TRIG 75 05/22/2015 0515   HDL 46 05/22/2015 0515   CHOLHDL 5.4 05/22/2015 0515   VLDL 15 05/22/2015 0515   LDLCALC 187* 05/22/2015 0515   HgbA1C  Lab Results  Component Value Date   HGBA1C 5.8* 05/22/2015    Urine Drug Screen:  No results found for: LABOPIA, COCAINSCRNUR, LABBENZ, AMPHETMU, THCU, LABBARB  Alcohol Level: No results for input(s): ETH in the last 168 hours.  No results found.     Carotid Doppler  Mild thickening of walls no significant stenosis  2D Echocardiogram  Left ventricle:  The cavity size was normal. Wall thickness was normal. Systolic function was normal. The estimated ejection fraction was in the range of 50% to 55%. There is akinesis of the basalinferolateral myocardium.   CXR  pending  EKG   Sinus rhythm with marked sinus arrhythmia with short PR Otherwise normal ECG  Therapy Recommendations  pending  Physical Exam   frail elderly African-American lady sitting up comfortably note bedside chair. . Afebrile. Head is nontraumatic. Neck is supple without bruit.    Cardiac exam no murmur or gallop. Lungs are clear to auscultation. Distal pulses are well felt. Neurological Exam : Awake alert oriented to person only. Diminished attention, registration and recall. Speech is slightly nonfluent with some word hesitancy and disfluency.  Follows simple midline and one-step commands only. No dysarthria. Extraocular moments are full range without nystagmus. Blinks to threat bilaterally. Fundi were not visualized. Vision acuity cannot be reliably tested. Mild right lower facial asymmetry when she smiles. Tongue midline. Motor system exam symmetric and equal strength against gravity in all 4 extremities. Fine finger movements appear symmetric. Deep tendon reflexes are 1+ symmetric. States touch and pinprick is symmetric and equal bilaterally Plantars are downgoing. Gait was not tested. ASSESSMENT Ariel Douglas is a 81 y.o. female presenting with New onset speech difficulty, right facial droop and drooling  . Infarct felt to be  embolic secondary to unknown source.  On aspirin 81 mg orally every day prior to admission. Now on aspirin 300 mg rectal suppository for secondary stroke prevention. Patient with resultant  Mild aphasia Stroke work up underway.    Hypertension  LDL 187  H/o TIA   Hospital day # 3  TREATMENT/PLAN  Continue aspirin for secondary stroke prevention. Patient remains at risk for neurological worsening, recurrent stroke, TIA needs ongoing stroke evaluation and aggressive risk factor control. Given patient's advanced age and comorbidities need to discuss with family how aggressive to be for stroke workup. I do not believe she is a good candidate for long-term anticoagulation and hence we will not consider TEE and loop recorder.  Transfer to [facility] rehabilitation. Stroke service will sign off.     Delia Heady, MD   To contact Stroke Continuity provider, please refer to [email or web address]. After hours, contact General Neurology

## 2015-05-25 ENCOUNTER — Non-Acute Institutional Stay (SKILLED_NURSING_FACILITY): Payer: Medicare Other | Admitting: Internal Medicine

## 2015-05-25 DIAGNOSIS — I639 Cerebral infarction, unspecified: Secondary | ICD-10-CM | POA: Diagnosis not present

## 2015-05-25 DIAGNOSIS — N183 Chronic kidney disease, stage 3 unspecified: Secondary | ICD-10-CM

## 2015-05-25 DIAGNOSIS — F0390 Unspecified dementia without behavioral disturbance: Secondary | ICD-10-CM | POA: Diagnosis not present

## 2015-05-25 DIAGNOSIS — I10 Essential (primary) hypertension: Secondary | ICD-10-CM | POA: Diagnosis not present

## 2015-05-27 ENCOUNTER — Encounter: Payer: Self-pay | Admitting: Internal Medicine

## 2015-05-27 NOTE — Progress Notes (Signed)
Patient ID: Ariel Douglas, female   DOB: [birth year], 81 y.o.   MRN: [id number]      This is an acute visit.  Level care skilled.  Facility [facility].  Chief complaint acute visit status post hospitalization for CVA  History of present illness Ariel Douglas is an 81 y.o. female with a history of hypertension, hyperlipidemia, coronary artery disease, TIA and dementia, presented  To hospital with new onset speech abnormality as well as right facial droop and drooling.  . CT scan of her head showed acute left sellar and frontal lobe MCA territory ischemic infarction. Old right frontal infarction was also noted She was not considered a candidate for tPA-actually her neurologic status has improved apparently significantly and she is here for rehabilitation.  She is on aspirin 325 mg a day per neurology recommendation.  She also is on a statin.  Patient previously was living with her daughter-she is actually ambulatory a day although somewhat weak and needing assistance.  Her vital signs have been stable-she is on atenolol per hospital reevaluation they will want to allow some permissive hypertension.  Previous medical history.  History of CVA.  Hypertension.  Dementia.  History of chronic kidney disease.  Coronary artery disease  Family medical social history. Limited secondary to patient being a poor historian--she is a former smoker with no history of illicit drug use or alcohol use of significance--. She lives with her daughter who is very supportive  Medications.  Aspirin 325 mg daily.  Lipitor 20 mg daily.  Ativan 0.5 mg every morning and 1 mg every afternoon.  Fish oil 1200 mg daily.  Atenolol 12.5 mg daily.  Ocuvite daily.  Pepto-Bismol daily when necessary.  Calcium with vitamin D twice a day  Namenda 10 mg twice a day  Review of systems this is limited secondary to dementia provided by nurse daughter and patient.  In general does not complain of  fever chills.  Apparently has made progress since CVA.  Skin does not complain of rashes or itching.  Head ears eyes nose mouth and throat does not complain of visual changes or difficulty swallowing.  Respiratory no complaint shortness breath or cough.  Cardiac no chest pain or significant edema.  GI does not complain of nausea vomiting diarrhea constipation or dysphagia.  GU is not complaining of dysuria.  Muscle skeletal has weakness but does not complain of joint pain.  Neurologic again history CVA with a history of mouth droop although this appears to have improved.  Psych-does have a history of dementia is pleasant and cooperative.  Physical exam.  Temperature is 98.5 pulse 80 respirations 22 blood pressure 121/75.  In general this is a pleasant elderly female in no distress.  Her skin is warm and dry.  Eyes pupils appear reactive light sclera and conjunctiva are clear visual acuity appears grossly intact.  Oropharynx is clear mucous membranes moist.  Chest is clear to auscultation with somewhat poor air entry there is no labored breathing.  Heart is regular rate and rhythm without murmur gallop or rub she does not have significant lower extremity edema she has somewhat reduced pedal pulses bilaterally.  Her abdomen is soft nontender she does have a well-healed surgical scar there are positive bowel sounds.  Musculoskeletal she is able to move all extremities 4 she is ambulatory with help I do not see any overt lateralizing findings possibly a slight mouth droop.  Neurologic as noted above again she does have a slight mouth droop  but is ambulatory with assistance.  Psych she is oriented to self she is pleasant she does follow simple verbal commands.  Labs.  05/22/2015.  Hemoglobin A1c 5.8.  Cholesterol 248-triglycerides 75-HDL 46-LDL 187.  Sodium 143 potassium 3.9 BUN 20 creatinine 1.6.  Hemoglobin 13.9.  05/21/2015.  WBC 5.1 hemoglobin 12.4  platelets 239      CLINICALLY SIGNIFICANT STUDIES Basic Metabolic Panel:   Recent Labs Lab 05/21/15 1507 05/21/15 1539  NA 140 143  K 3.8 3.9  CL 107 107  CO2 22  --   GLUCOSE 96 93  BUN 16 20  CREATININE 1.55* 1.60*  CALCIUM 9.9  --    Liver Function Tests:   Recent Labs Lab 05/21/15 1507  AST 23  ALT 12*  ALKPHOS 58  BILITOT 0.6  PROT 7.3  ALBUMIN 3.7   CBC:   Recent Labs Lab 05/21/15 1507 05/21/15 1539  WBC 5.1  --   NEUTROABS 2.9  --   HGB 12.4 13.9  HCT 38.3 41.0  MCV 83.4  --   PLT 239  --    Coagulation:   Recent Labs Lab 05/21/15 1507  LABPROT 14.5  INR 1.11   Cardiac Enzymes: No results for input(s): CKTOTAL, CKMB, CKMBINDEX, TROPONINI in the last 168 hours. Urinalysis: No results for input(s): COLORURINE, LABSPEC, PHURINE, GLUCOSEU, HGBUR, BILIRUBINUR, KETONESUR, PROTEINUR, UROBILINOGEN, NITRITE, LEUKOCYTESUR in the last 168 hours.  Invalid input(s): APPERANCEUR Lipid Panel    Component Value Date/Time   CHOL 248* 05/22/2015 0515   TRIG 75 05/22/2015 0515   HDL 46 05/22/2015 0515   CHOLHDL 5.4 05/22/2015 0515   VLDL 15 05/22/2015 0515   LDLCALC 187* 05/22/2015 0515   HgbA1C  Lab Results  Component Value Date   HGBA1C 5.8* 05/22/2015    Urine Drug Screen:  No results found for: LABOPIA, COCAINSCRNUR, LABBENZ, AMPHETMU, THCU, LABBARB  Alcohol Level: No results for input(s): ETH in the last 168 hours.  No results found.     Carotid Doppler  Mild thickening of walls no significant stenosis  2D Echocardiogram  Left ventricle: The cavity size was normal. Wall thickness was normal. Systolic function was normal. The estimated ejection fraction was in the range of 50% to 55%. There is akinesis of the basalinferolateral myocardium.      EKG   Sinus rhythm with marked sinus arrhythmia with short PR Otherwise normal ECG    Assessment plan.  #1 history of CVA she has made it appears a fairly good recovery here has a  mild right-sided mouth droop-she does have baseline dementia -she will need PT and OT and supportive care-she is on aspirin 325 mg a day as well as a statin.  #2 dementia -continues on Namenda this had been held initially after her CVA and has been restarted-she does have an Ativan order for apparently some occasional agitation currently she appears to be stable her daughter is with her and appears to be doing relatively well. With supportive care  #3 hypertension this appears stable atenolol has been restarted recommendation by neurology to have some permissive hypertension. #4 Coronary artery disease -she is on aspirin again as well as a statin.  #5 history of chronic kidney disease most recent creatinine 1.6 BUN of 20 apparently per chart review this appears to be fairly stable we'll update this.  #6 hyperlipidemia-LDL of 187 she is on a statin this will have to be monitored on a long-term basis  [id number]-of note greater than 35 minutes spent assessing patient-reviewing  her chart-discussing her status with nursing staff as well as with family at bedside-andcoordinating and formulating a plan of care for numerous diagnoses-of note  greater than 50% of time spent coordinating plan of care

## 2015-05-30 ENCOUNTER — Non-Acute Institutional Stay (SKILLED_NURSING_FACILITY): Payer: Medicare Other | Admitting: Internal Medicine

## 2015-05-30 ENCOUNTER — Encounter: Payer: Self-pay | Admitting: Internal Medicine

## 2015-05-30 DIAGNOSIS — I69391 Dysphagia following cerebral infarction: Secondary | ICD-10-CM

## 2015-05-30 DIAGNOSIS — E559 Vitamin D deficiency, unspecified: Secondary | ICD-10-CM

## 2015-05-30 DIAGNOSIS — N183 Chronic kidney disease, stage 3 unspecified: Secondary | ICD-10-CM

## 2015-05-30 DIAGNOSIS — F0390 Unspecified dementia without behavioral disturbance: Secondary | ICD-10-CM

## 2015-05-30 DIAGNOSIS — E785 Hyperlipidemia, unspecified: Secondary | ICD-10-CM

## 2015-05-30 DIAGNOSIS — I10 Essential (primary) hypertension: Secondary | ICD-10-CM

## 2015-05-30 DIAGNOSIS — I639 Cerebral infarction, unspecified: Secondary | ICD-10-CM | POA: Diagnosis not present

## 2015-05-30 DIAGNOSIS — I69921 Dysphasia following unspecified cerebrovascular disease: Secondary | ICD-10-CM

## 2015-05-30 HISTORY — DX: Vitamin D deficiency, unspecified: E55.9

## 2015-05-30 NOTE — Progress Notes (Signed)
MRN: [id number] Name: Ariel Douglas  Sex: female Age: 81 y.o. DOB: 1919-02-13  [facility] #: [facility] Facility/Room:503 Level Of Care: SNF Provider: Merrilee Seashore D Emergency Contacts: Extended Emergency Contact Information Primary Emergency Contact: OLIVER,CYNTHIA Home Phone: [phone] Relation: None Secondary Emergency Contact: Hilario Quarry [place] Home Phone: [phone] Mobile Phone: [phone] Relation: Daughter  Code Status:   Allergies: Penicillins  Chief Complaint  Patient presents with  . New Admit To SNF    HPI: Patient is 81 y.o. female with a history of hypertension, TIA in the past, coronary artery disease, dementia and hyperlipidemia was brought into the hospital by her daughter for slurred speech. Pt was admitted to hospital from 9/18-21 where she was dx with acute ischemic stroke. Hospital course complicated by low BP and meds were held. Pt is admitted to SNF for s/p stroke OT/PT. While at SNF pt will be followed for HTN, tx with atenolol, HLD, tx with lipitor and fish oil and Vit D def tx with supplement.  Past Medical History  Diagnosis Date  . Hypertension   . Coronary artery disease   . Dementia   . Hyperlipidemia   . Transient ischemic attack (TIA)     History reviewed. No pertinent past surgical history.    Medication List       This list is accurate as of: 05/30/15 11:59 PM.  Always use your most recent med list.               aspirin 325 MG tablet  Take 1 tablet (325 mg total) by mouth daily.     atenolol 25 MG tablet  Commonly known as:  TENORMIN  Take 12.5 mg by mouth daily.     atorvastatin 20 MG tablet  Commonly known as:  LIPITOR  Take 1 tablet (20 mg total) by mouth daily at 6 PM.     beta carotene w/minerals tablet  Take 1 tablet by mouth daily.     bismuth subsalicylate 262 MG/15ML suspension  Commonly known as:  PEPTO BISMOL  Take 30 mLs by mouth daily as needed for indigestion.     CALCIUM  CITRATE + D3 PO  Take 1 tablet by mouth 2 (two) times daily. Per tablet: calcium citrate 200 mg, vitamin D3 250 i.u.     cholecalciferol 1000 UNITS tablet  Commonly known as:  VITAMIN D  Take 1,000 Units by mouth 2 (two) times daily.     Fish Oil 1200 MG Caps  Take 1,200 mg by mouth daily.     l-methylfolate-b2-b6-b12 01-31-49-5 MG Tabs  Commonly known as:  CEREFOLIN  Take 1 tablet by mouth daily.     LORazepam 1 MG tablet  Commonly known as:  ATIVAN  Take 0.5-1 tablets (0.5-1 mg total) by mouth every 8 (eight) hours as needed for anxiety. Take 1/2 tablet (0.5 mg) by mouth every morning and 1 tablet (1 mg) at bedtime     memantine 10 MG tablet  Commonly known as:  NAMENDA  Take 10 mg by mouth 2 (two) times daily.        No orders of the defined types were placed in this encounter.     There is no immunization history on file for this patient.  Social History  Substance Use Topics  . Smoking status: Former Games developer  . Smokeless tobacco: Not on file  . Alcohol Use: No    Family history is + asthma , HD   Review of Systems  DATA  OBTAINED: from patient, nurse, daughter, grand daughter GENERAL:  no fevers, fatigue, appetite very improved, family wants her to have regular diet,she has eaten since being at SNF fine SKIN: No itching, rash or wounds EYES: No eye pain, redness, discharge EARS: No earache, tinnitus, change in hearing NOSE: No congestion, drainage or bleeding  MOUTH/THROAT: No mouth or tooth pain, No sore throat RESPIRATORY: No cough, wheezing, SOB CARDIAC: No chest pain, palpitations, lower extremity edema  GI: No abdominal pain, No N/V/D or constipation, No heartburn or reflux  GU: No dysuria, frequency or urgency, or incontinence  MUSCULOSKELETAL: No unrelieved bone/joint pain NEUROLOGIC: No headache, dizziness or focal weakness PSYCHIATRIC: No c/o anxiety or sadness   Filed Vitals:   05/30/15 1624  BP: 147/87  Pulse: 53  Temp: 97.2 F (36.2 C)   Resp: 18    SpO2 Readings from Last 1 Encounters:  05/24/15 99%        Physical Exam  GENERAL APPEARANCE: Alert, conversant,  No acute distress, currently eating dinner as fast as she can SKIN: No diaphoresis rash HEAD: Normocephalic, atraumatic  EYES: Conjunctiva/lids clear. Pupils round, reactive. EOMs intact.  EARS: External exam WNL, canals clear. Hearing grossly normal.  NOSE: No deformity or discharge.  MOUTH/THROAT: Lips w/o lesions  RESPIRATORY: Breathing is even, unlabored. Lung sounds are clear   CARDIOVASCULAR: Heart RRR no murmurs, rubs or gallops. No peripheral edema.   GASTROINTESTINAL: Abdomen is soft, non-tender, not distended w/ normal bowel sounds. GENITOURINARY: Bladder non tender, not distended  MUSCULOSKELETAL: No abnormal joints or musculature NEUROLOGIC:  Cranial nerves 2-12 grossly intact. Moves all extremities  PSYCHIATRIC: dementia, no behavioral issues  Patient Active Problem List   Diagnosis Date Noted  . Dysphagia due to recent stroke 05/31/2015  . Hyperlipidemia 05/30/2015  . Vitamin D deficiency 05/30/2015  . Acute CVA (cerebrovascular accident)   . CVA (cerebral infarction) 05/21/2015  . Chronic kidney disease, stage 3 05/21/2015  . Dementia 05/21/2015  . Hypertension 05/21/2015  . UTI (urinary tract infection) 05/25/2013  . Acute renal failure 05/25/2013  . Syncope 05/24/2013    CBC    Component Value Date/Time   WBC 5.1 05/21/2015 1507   RBC 4.59 05/21/2015 1507   HGB 13.9 05/21/2015 1539   HCT 41.0 05/21/2015 1539   PLT 239 05/21/2015 1507   MCV 83.4 05/21/2015 1507   LYMPHSABS 1.6 05/21/2015 1507   MONOABS 0.4 05/21/2015 1507   EOSABS 0.1 05/21/2015 1507   BASOSABS 0.0 05/21/2015 1507    CMP     Component Value Date/Time   NA 143 05/21/2015 1539   K 3.9 05/21/2015 1539   CL 107 05/21/2015 1539   CO2 22 05/21/2015 1507   GLUCOSE 93 05/21/2015 1539   BUN 20 05/21/2015 1539   CREATININE 1.60* 05/21/2015 1539    CALCIUM 9.9 05/21/2015 1507   PROT 7.3 05/21/2015 1507   ALBUMIN 3.7 05/21/2015 1507   AST 23 05/21/2015 1507   ALT 12* 05/21/2015 1507   ALKPHOS 58 05/21/2015 1507   BILITOT 0.6 05/21/2015 1507   GFRNONAA 27* 05/21/2015 1507   GFRAA 32* 05/21/2015 1507    Lab Results  Component Value Date   HGBA1C 5.8* 05/22/2015     Ct Head Wo Contrast  05/21/2015   CLINICAL DATA:  81 year old female with a history of acute speech disorder.  EXAM: CT HEAD WITHOUT CONTRAST  TECHNIQUE: Contiguous axial images were obtained from the base of the skull through the vertex without intravenous contrast.  COMPARISON:  02/06/2009, 05/23/2013  FINDINGS: Loss of gray-white differentiation of the left anterior insula and within the left frontal low.  No acute intracranial hemorrhage.  No midline shift or mass effect.  Hypodensity of the right frontal region is new from the comparison CT, though appears completed/remote.  Hypodense focus of the right cerebellar hemisphere, new from the comparison CT.  Unremarkable configuration of the ventricles.  Intracranial atherosclerosis.  Unremarkable appearance of the calvarium without acute fracture or aggressive lesion.  Unremarkable appearance of the scalp soft tissues.  Unremarkable appearance of the bilateral orbits.  .  Mastoid air cells are clear.  No significant paranasal sinus disease  IMPRESSION: Acute ischemia of the left anterior insula and left frontal lobe in the territory of the left MCA. Aspects score of 7.  Encephalomalacia with completed infarction of the right frontal lobe, new from the CT dated 05/23/2013. There is also a new focal infarction of the right cerebellar hemisphere, age indeterminate.  No evidence of intracranial hemorrhage.  Intracranial atherosclerosis.  These results were called by telephone at the time of interpretation on 05/21/2015 at 3:54 pm to Dr. Blane Ohara , who verbally acknowledged these results.  Signed,  Yvone Neu. Loreta Ave, DO  [facility]  [facility]   Electronically Signed   By: Gilmer Mor D.O.   On: 05/21/2015 15:56   Mr Brain Wo Contrast  05/22/2015   CLINICAL DATA:  Slurred speech, dysphagia, confusion. History of hypertension, dementia, hyperlipidemia.  EXAM: MRI HEAD WITHOUT CONTRAST  MRA HEAD WITHOUT CONTRAST  TECHNIQUE: Multiplanar, multiecho pulse sequences of the brain and surrounding structures were obtained without intravenous contrast. Angiographic images of the head were obtained using MRA technique without contrast.  COMPARISON:  CT head May 21, 2015  FINDINGS: Motion degraded examination, per technologist note, patient was confused, and could not remain still.  MRI HEAD FINDINGS  Subcentimeter focus of reduced diffusion RIGHT cerebellum. Reduced diffusion LEFT frontal lobe/insula with corresponding low ADC values corresponding to CT abnormality. No susceptibility artifact to suggest hemorrhage. Ventricles and sulci are normal for patient's age. Old small cerebellar infarcts. RIGHT inferior basal ganglia perivascular space. Multiple tiny perivascular spaces throughout the cerebrum. Minimal white matter changes compatible chronic small vessel ischemic disease, which may be under appreciated due to patient motion. RIGHT frontal encephalomalacia extending to the insula.  No abnormal extra-axial fluid collections. Status post bilateral ocular lens implants. Mild paranasal sinus mucosal thickening. Possible polyp LEFT posterior nasal turbinate. Trace LEFT mastoid effusion. No abnormal sellar expansion. No cerebellar tonsillar ectopia. No suspicious calvarial bone marrow signal.  MRA HEAD FINDINGS  Moderate to severely motion degraded examination.  Flow related enhancement within the bilateral internal carotid arteries, bilateral anterior cerebral arteries. Flow related enhancement of the bilateral middle cerebral arteries.  Bilateral vertebral arteries are patent, basilar artery is  patent. Small LEFT posterior communicating artery is present. Bilateral posterior cerebral arteries are patent.  IMPRESSION: MRI HEAD: Moderately motion degraded examination.  Acute LEFT MCA territory infarct predominately involving the insula.  Acute small RIGHT cerebellar infarct.  Chronic changes including old RIGHT MCA territory infarct.  MRA HEAD: Moderate to severely motion degraded examination without acute large vessel occlusion.   Electronically Signed   By: Awilda Metro M.D.   On: 05/22/2015 03:39   Mr Maxine Glenn Head/brain Wo Cm  05/22/2015   CLINICAL DATA:  Slurred speech, dysphagia, confusion. History of hypertension, dementia, hyperlipidemia.  EXAM: MRI HEAD WITHOUT CONTRAST  MRA HEAD WITHOUT CONTRAST  TECHNIQUE:  Multiplanar, multiecho pulse sequences of the brain and surrounding structures were obtained without intravenous contrast. Angiographic images of the head were obtained using MRA technique without contrast.  COMPARISON:  CT head May 21, 2015  FINDINGS: Motion degraded examination, per technologist note, patient was confused, and could not remain still.  MRI HEAD FINDINGS  Subcentimeter focus of reduced diffusion RIGHT cerebellum. Reduced diffusion LEFT frontal lobe/insula with corresponding low ADC values corresponding to CT abnormality. No susceptibility artifact to suggest hemorrhage. Ventricles and sulci are normal for patient's age. Old small cerebellar infarcts. RIGHT inferior basal ganglia perivascular space. Multiple tiny perivascular spaces throughout the cerebrum. Minimal white matter changes compatible chronic small vessel ischemic disease, which may be under appreciated due to patient motion. RIGHT frontal encephalomalacia extending to the insula.  No abnormal extra-axial fluid collections. Status post bilateral ocular lens implants. Mild paranasal sinus mucosal thickening. Possible polyp LEFT posterior nasal turbinate. Trace LEFT mastoid effusion. No abnormal sellar  expansion. No cerebellar tonsillar ectopia. No suspicious calvarial bone marrow signal.  MRA HEAD FINDINGS  Moderate to severely motion degraded examination.  Flow related enhancement within the bilateral internal carotid arteries, bilateral anterior cerebral arteries. Flow related enhancement of the bilateral middle cerebral arteries.  Bilateral vertebral arteries are patent, basilar artery is patent. Small LEFT posterior communicating artery is present. Bilateral posterior cerebral arteries are patent.  IMPRESSION: MRI HEAD: Moderately motion degraded examination.  Acute LEFT MCA territory infarct predominately involving the insula.  Acute small RIGHT cerebellar infarct.  Chronic changes including old RIGHT MCA territory infarct.  MRA HEAD: Moderate to severely motion degraded examination without acute large vessel occlusion.   Electronically Signed   By: Awilda Metro M.D.   On: 05/22/2015 03:39    Not all labs, radiology exams or other studies done during hospitalization come through on my EPIC note; however they are reviewed by me.    Assessment and Plan  Acute CVA (cerebrovascular accident) ): Acute LEFT MCA territory infarct predominately involving the insula and Acute small RIGHT cerebellar infarct. - Dysphagia 1 diet - ASA -MRI/MRA +, carotid Dopplers, 2-D echo, lipid panel- LDL elevated and A1c 5.8 Added lipid SNF - OT/PT  Dysphagia due to recent stroke SNF - has resolved;pt d/c on dysphagia 1 diet but family wants her to have regular diet and ST feels she is OK with this as well  Dementia SNF - namenda was held in hosp but pt is back on it now  Hypertension SNF - atenolo was held in hospital but pt is back on it now and BP is fine, not too low  Chronic kidney disease, stage 3 SNF stable - d/c Cr was 1.55  Hyperlipidemia SNF - newly dx, LDL in hosp was 187; will cont lipitor 20 mg daily;repeat FLP 3 months  Vitamin D deficiency SNF - cont vit D 1000u BID   Time  spent > 45 min;> 50% of time with patient was spent reviewing records, labs, tests and studies, counseling and developing plan of care  Margit Hanks, MD

## 2015-05-31 DIAGNOSIS — I69391 Dysphagia following cerebral infarction: Secondary | ICD-10-CM | POA: Insufficient documentation

## 2015-05-31 HISTORY — DX: Dysphagia following cerebral infarction: I69.391

## 2015-05-31 NOTE — Assessment & Plan Note (Signed)
SNF - has resolved;pt d/c on dysphagia 1 diet but family wants her to have regular diet and ST feels she is OK with this as well

## 2015-05-31 NOTE — Assessment & Plan Note (Signed)
SNF - cont vit D 1000u BID

## 2015-05-31 NOTE — Assessment & Plan Note (Signed)
):   Acute LEFT MCA territory infarct predominately involving the insula and Acute small RIGHT cerebellar infarct. - Dysphagia 1 diet - ASA -MRI/MRA +, carotid Dopplers, 2-D echo, lipid panel- LDL elevated and A1c 5.8 Added lipid SNF - OT/PT

## 2015-05-31 NOTE — Assessment & Plan Note (Signed)
SNF - newly dx, LDL in hosp was 187; will cont lipitor 20 mg daily;repeat FLP 3 months

## 2015-05-31 NOTE — Assessment & Plan Note (Signed)
SNF - atenolo was held in hospital but pt is back on it now and BP is fine, not too low

## 2015-05-31 NOTE — Assessment & Plan Note (Signed)
SNF stable - d/c Cr was 1.55

## 2015-05-31 NOTE — Assessment & Plan Note (Signed)
SNF - namenda was held in hosp but pt is back on it now

## 2015-06-12 ENCOUNTER — Non-Acute Institutional Stay (SKILLED_NURSING_FACILITY): Payer: Medicare Other | Admitting: Internal Medicine

## 2015-06-12 DIAGNOSIS — I10 Essential (primary) hypertension: Secondary | ICD-10-CM | POA: Diagnosis not present

## 2015-06-12 DIAGNOSIS — I639 Cerebral infarction, unspecified: Secondary | ICD-10-CM | POA: Diagnosis not present

## 2015-06-12 DIAGNOSIS — F0390 Unspecified dementia without behavioral disturbance: Secondary | ICD-10-CM

## 2015-06-12 DIAGNOSIS — N183 Chronic kidney disease, stage 3 unspecified: Secondary | ICD-10-CM

## 2015-06-12 NOTE — Progress Notes (Signed)
Patient ID: Ariel Douglas, female   DOB: [birth year], 81 y.o.   MRN: [id number] MRN: [id number] Name: Ariel Douglas  Sex: female Age: 81 y.o. DOB: [birth year]  [facility] #[facility] Facility/Room:503 Level Of Care: SNF Provider: Roena Malady Emergency Contacts: Extended Emergency Contact Information Primary Emergency Contact: OLIVER,CYNTHIA Home Phone: [phone] Relation: None Secondary Emergency Contact: Hilario Quarry [place] Home Phone: [phone] Mobile Phone: [phone] Relation: Daughter  Code Status:   Allergies: Penicillins  Chief Complaint  Patient presents with  . Discharge Note    HPI: Patient is 81 y.o. female with a history of hypertension, TIA in the past, coronary artery disease, dementia and hyperlipidemia was brought into the hospital by her daughter for slurred speech. Pt was admitted to hospital from 9/18-21 where she was dx with acute ischemic stroke. Hospital course complicated by low BP and meds were held. Pt was admitted to SNF for s/p stroke OT/PT. While at SNF pt followed for HTN, tx with atenolol, HLD, tx with lipitor and fish oil and Vit D def tx with supplement.  Her stay here is been quite unremarkable she has gained strength blood pressures have been stable most recently 136/72-130/68.  She is ambulating with some residual weakness but has improved she will benefit from continued PT and OT as well as home health support-also will need a CNA to help with ADLs and a Child psychotherapist for community services  Past Medical History  Diagnosis Date  . Hypertension   . Coronary artery disease   . Dementia   . Hyperlipidemia   . Transient ischemic attack (TIA)     No past surgical history on file.    Medication List       This list is accurate as of: 06/12/15 11:59 PM.  Always use your most recent med list.               aspirin 325 MG tablet  Take 1 tablet (325 mg total) by mouth daily.     atenolol 25 MG tablet   Commonly known as:  TENORMIN  Take 12.5 mg by mouth daily.     atorvastatin 20 MG tablet  Commonly known as:  LIPITOR  Take 1 tablet (20 mg total) by mouth daily at 6 PM.     beta carotene w/minerals tablet  Take 1 tablet by mouth daily.     bismuth subsalicylate 262 MG/15ML suspension  Commonly known as:  PEPTO BISMOL  Take 30 mLs by mouth daily as needed for indigestion.     CALCIUM CITRATE + D3 PO  Take 1 tablet by mouth 2 (two) times daily. Per tablet: calcium citrate 200 mg, vitamin D3 250 i.u.     cholecalciferol 1000 UNITS tablet  Commonly known as:  VITAMIN D  Take 1,000 Units by mouth 2 (two) times daily.     Fish Oil 1200 MG Caps  Take 1,200 mg by mouth daily.     l-methylfolate-b2-b6-b12 01-31-49-5 MG Tabs  Commonly known as:  CEREFOLIN  Take 1 tablet by mouth daily.     LORazepam 1 MG tablet  Commonly known as:  ATIVAN  Take 0.5-1 tablets (0.5-1 mg total) by mouth every 8 (eight) hours as needed for anxiety. Take 1/2 tablet (0.5 mg) by mouth every morning and 1 tablet (1 mg) at bedtime     memantine 10 MG tablet  Commonly known as:  NAMENDA  Take 10 mg by mouth 2 (two) times daily.  There is no immunization history on file for this patient.  Social History  Substance Use Topics  . Smoking status: Former Games developer  . Smokeless tobacco: Not on file  . Alcohol Use: No    Family history is + asthma , HD   Review of Systems  DATA OBTAINED: from patient, nurse,  GENERAL:  no fevers, fatigue, appetite  improved, family wants her to have regular diet,she has eaten since being at SNF fine SKIN: No itching, rash or wounds EYES: No eye pain, redness, discharge EARS: No earache, tinnitus, change in hearing NOSE: No congestion, drainage or bleeding  MOUTH/THROAT: No mouth or tooth pain, No sore throat RESPIRATORY: No cough, wheezing, SOB CARDIAC: No chest pain, palpitations, minimal extremity edema  GI: No abdominal pain, No N/V/D or constipation,  No heartburn or reflux  GU: No dysuria, frequency or urgency, or incontinence  MUSCULOSKELETAL: No unrelieved bone/joint pain NEUROLOGIC: No headache, dizziness or focal weakness PSYCHIATRIC: No c/o anxiety or sadness   Filed Vitals:   06/12/15 2134  BP: 136/72  Pulse: 60  Temp: 97 F (36.1 C)  Resp: 18    SpO2 Readings from Last 1 Encounters:  05/24/15 99%        Physical Exam  GENERAL APPEARANCE: Alert, conversant,  No acute distress SKIN: No diaphoresis rash HEAD: Normocephalic, atraumatic  EYES: Conjunctiva/lids clear. Pupils round, reactive. EOMs intact.  EARS: External exam WNL, canals clear. Hearing grossly normal.  NOSE: No deformity or discharge.  MOUTH/THROAT: Lips w/o lesions oropharynx clear mucous membranes moist  RESPIRATORY: Breathing is even, unlabored. Lung sounds are clear   CARDIOVASCULAR: Heart RRR no murmurs, rubs or gallops. trace peripheral edema.   GASTROINTESTINAL: Abdomen is soft, non-tender, not distended w/ normal bowel sounds. GENITOURINARY: Bladder non tender, not distended  MUSCULOSKELETAL: No abnormal joints or musculature is able to stand without assistance and ambulate although she is somewhat weak actually did a little dance to show how much she has improved NEUROLOGIC:  Cranial nerves 2-12 grossly intact. Moves all extremities  PSYCHIATRIC: dementia, no behavioral issues pleasant smiling appropriate  Patient Active Problem List   Diagnosis Date Noted  . Dysphagia due to recent stroke 05/31/2015  . Hyperlipidemia 05/30/2015  . Vitamin D deficiency 05/30/2015  . Acute CVA (cerebrovascular accident) (HCC)   . CVA (cerebral infarction) 05/21/2015  . Chronic kidney disease, stage 3 05/21/2015  . Dementia 05/21/2015  . Hypertension 05/21/2015  . UTI (urinary tract infection) 05/25/2013  . Acute renal failure (HCC) 05/25/2013  . Syncope 05/24/2013    CBC    Component Value Date/Time   WBC 5.1 05/21/2015 1507   RBC 4.59 05/21/2015  1507   HGB 13.9 05/21/2015 1539   HCT 41.0 05/21/2015 1539   PLT 239 05/21/2015 1507   MCV 83.4 05/21/2015 1507   LYMPHSABS 1.6 05/21/2015 1507   MONOABS 0.4 05/21/2015 1507   EOSABS 0.1 05/21/2015 1507   BASOSABS 0.0 05/21/2015 1507    CMP     Component Value Date/Time   NA 143 05/21/2015 1539   K 3.9 05/21/2015 1539   CL 107 05/21/2015 1539   CO2 22 05/21/2015 1507   GLUCOSE 93 05/21/2015 1539   BUN 20 05/21/2015 1539   CREATININE 1.60* 05/21/2015 1539   CALCIUM 9.9 05/21/2015 1507   PROT 7.3 05/21/2015 1507   ALBUMIN 3.7 05/21/2015 1507   AST 23 05/21/2015 1507   ALT 12* 05/21/2015 1507   ALKPHOS 58 05/21/2015 1507   BILITOT 0.6 05/21/2015 1507  GFRNONAA 27* 05/21/2015 1507   GFRAA 32* 05/21/2015 1507    Lab Results  Component Value Date   HGBA1C 5.8* 05/22/2015     Ct Head Wo Contrast  05/21/2015   CLINICAL DATA:  81 year old female with a history of acute speech disorder.  EXAM: CT HEAD WITHOUT CONTRAST  TECHNIQUE: Contiguous axial images were obtained from the base of the skull through the vertex without intravenous contrast.  COMPARISON:  02/06/2009, 05/23/2013  FINDINGS: Loss of gray-white differentiation of the left anterior insula and within the left frontal low.  No acute intracranial hemorrhage.  No midline shift or mass effect.  Hypodensity of the right frontal region is new from the comparison CT, though appears completed/remote.  Hypodense focus of the right cerebellar hemisphere, new from the comparison CT.  Unremarkable configuration of the ventricles.  Intracranial atherosclerosis.  Unremarkable appearance of the calvarium without acute fracture or aggressive lesion.  Unremarkable appearance of the scalp soft tissues.  Unremarkable appearance of the bilateral orbits.  .  Mastoid air cells are clear.  No significant paranasal sinus disease  IMPRESSION: Acute ischemia of the left anterior insula and left frontal lobe in the territory of the left MCA. Aspects  score of 7.  Encephalomalacia with completed infarction of the right frontal lobe, new from the CT dated 05/23/2013. There is also a new focal infarction of the right cerebellar hemisphere, age indeterminate.  No evidence of intracranial hemorrhage.  Intracranial atherosclerosis.  These results were called by telephone at the time of interpretation on 05/21/2015 at 3:54 pm to Dr. Blane Ohara , who verbally acknowledged these results.  Signed,  Yvone Neu. Loreta Ave, DO  [facility]  [facility]   Electronically Signed   By: Gilmer Mor D.O.   On: 05/21/2015 15:56   Mr Brain Wo Contrast  05/22/2015   CLINICAL DATA:  Slurred speech, dysphagia, confusion. History of hypertension, dementia, hyperlipidemia.  EXAM: MRI HEAD WITHOUT CONTRAST  MRA HEAD WITHOUT CONTRAST  TECHNIQUE: Multiplanar, multiecho pulse sequences of the brain and surrounding structures were obtained without intravenous contrast. Angiographic images of the head were obtained using MRA technique without contrast.  COMPARISON:  CT head May 21, 2015  FINDINGS: Motion degraded examination, per technologist note, patient was confused, and could not remain still.  MRI HEAD FINDINGS  Subcentimeter focus of reduced diffusion RIGHT cerebellum. Reduced diffusion LEFT frontal lobe/insula with corresponding low ADC values corresponding to CT abnormality. No susceptibility artifact to suggest hemorrhage. Ventricles and sulci are normal for patient's age. Old small cerebellar infarcts. RIGHT inferior basal ganglia perivascular space. Multiple tiny perivascular spaces throughout the cerebrum. Minimal white matter changes compatible chronic small vessel ischemic disease, which may be under appreciated due to patient motion. RIGHT frontal encephalomalacia extending to the insula.  No abnormal extra-axial fluid collections. Status post bilateral ocular lens implants. Mild paranasal sinus mucosal thickening. Possible  polyp LEFT posterior nasal turbinate. Trace LEFT mastoid effusion. No abnormal sellar expansion. No cerebellar tonsillar ectopia. No suspicious calvarial bone marrow signal.  MRA HEAD FINDINGS  Moderate to severely motion degraded examination.  Flow related enhancement within the bilateral internal carotid arteries, bilateral anterior cerebral arteries. Flow related enhancement of the bilateral middle cerebral arteries.  Bilateral vertebral arteries are patent, basilar artery is patent. Small LEFT posterior communicating artery is present. Bilateral posterior cerebral arteries are patent.  IMPRESSION: MRI HEAD: Moderately motion degraded examination.  Acute LEFT MCA territory infarct predominately involving the insula.  Acute small RIGHT cerebellar  infarct.  Chronic changes including old RIGHT MCA territory infarct.  MRA HEAD: Moderate to severely motion degraded examination without acute large vessel occlusion.   Electronically Signed   By: Awilda Metro M.D.   On: 05/22/2015 03:39   Mr Maxine Glenn Head/brain Wo Cm  05/22/2015   CLINICAL DATA:  Slurred speech, dysphagia, confusion. History of hypertension, dementia, hyperlipidemia.  EXAM: MRI HEAD WITHOUT CONTRAST  MRA HEAD WITHOUT CONTRAST  TECHNIQUE: Multiplanar, multiecho pulse sequences of the brain and surrounding structures were obtained without intravenous contrast. Angiographic images of the head were obtained using MRA technique without contrast.  COMPARISON:  CT head May 21, 2015  FINDINGS: Motion degraded examination, per technologist note, patient was confused, and could not remain still.  MRI HEAD FINDINGS  Subcentimeter focus of reduced diffusion RIGHT cerebellum. Reduced diffusion LEFT frontal lobe/insula with corresponding low ADC values corresponding to CT abnormality. No susceptibility artifact to suggest hemorrhage. Ventricles and sulci are normal for patient's age. Old small cerebellar infarcts. RIGHT inferior basal ganglia perivascular  space. Multiple tiny perivascular spaces throughout the cerebrum. Minimal white matter changes compatible chronic small vessel ischemic disease, which may be under appreciated due to patient motion. RIGHT frontal encephalomalacia extending to the insula.  No abnormal extra-axial fluid collections. Status post bilateral ocular lens implants. Mild paranasal sinus mucosal thickening. Possible polyp LEFT posterior nasal turbinate. Trace LEFT mastoid effusion. No abnormal sellar expansion. No cerebellar tonsillar ectopia. No suspicious calvarial bone marrow signal.  MRA HEAD FINDINGS  Moderate to severely motion degraded examination.  Flow related enhancement within the bilateral internal carotid arteries, bilateral anterior cerebral arteries. Flow related enhancement of the bilateral middle cerebral arteries.  Bilateral vertebral arteries are patent, basilar artery is patent. Small LEFT posterior communicating artery is present. Bilateral posterior cerebral arteries are patent.  IMPRESSION: MRI HEAD: Moderately motion degraded examination.  Acute LEFT MCA territory infarct predominately involving the insula.  Acute small RIGHT cerebellar infarct.  Chronic changes including old RIGHT MCA territory infarct.  MRA HEAD: Moderate to severely motion degraded examination without acute large vessel occlusion.   Electronically Signed   By: Awilda Metro M.D.   On: 05/22/2015 03:39    Not all labs, radiology exams or other studies done during hospitalization come through on my EPIC note; however they are reviewed by me.    Assessment and Plan   Acute CVA-she does continue on aspirin as well as a statin she has done well with PT and OT will need continued PT and OT for strengthening as well as home health support.  Number to dysphagia due to recent stroke-she is now apparently on a regular diet and tolerating this well she eats well.  #3 dementia this appears to be moderate she is on Namenda she appears to be  doing well with this.  #4 hypertension blood pressure appears to be stable as noted above she is on atenolol.  #5 history chronic kidney disease discharge creatinine was 1.55 we will update this before discharge area  Hyperlipidemia diagnosis LDL the hospital was 187 she is on Lipitor 20 mg a day this will need follow-up by her primary care provider.  History of peripheral vascular disease this is stable she is on aspirin.  Again patient will need continued PT and OT-as well as home health support-a CNA to help with ADLs since she still has continued weakness and is a fall risk as well as a Child psychotherapist to help with community services she does have a very supportive  family.  Also will update a BMP with history of renal insufficiency as well as a CBC to ensure stability.  [id number] note greater than 30 minutes spent on this discharge summary-greater than 50% of time spent coordinating plan of care for numerous diagnoses-prescriptions have been written     Deedra Pro C,

## 2015-06-18 DIAGNOSIS — F0390 Unspecified dementia without behavioral disturbance: Secondary | ICD-10-CM | POA: Diagnosis not present

## 2015-06-18 DIAGNOSIS — N393 Stress incontinence (female) (male): Secondary | ICD-10-CM | POA: Diagnosis not present

## 2015-06-18 DIAGNOSIS — R279 Unspecified lack of coordination: Secondary | ICD-10-CM | POA: Diagnosis not present

## 2015-06-18 DIAGNOSIS — Z9181 History of falling: Secondary | ICD-10-CM | POA: Diagnosis not present

## 2015-06-18 DIAGNOSIS — I2510 Atherosclerotic heart disease of native coronary artery without angina pectoris: Secondary | ICD-10-CM | POA: Diagnosis not present

## 2015-06-18 DIAGNOSIS — N183 Chronic kidney disease, stage 3 (moderate): Secondary | ICD-10-CM | POA: Diagnosis not present

## 2015-06-18 DIAGNOSIS — I129 Hypertensive chronic kidney disease with stage 1 through stage 4 chronic kidney disease, or unspecified chronic kidney disease: Secondary | ICD-10-CM | POA: Diagnosis not present

## 2015-06-18 DIAGNOSIS — R262 Difficulty in walking, not elsewhere classified: Secondary | ICD-10-CM | POA: Diagnosis not present

## 2015-06-18 DIAGNOSIS — I69398 Other sequelae of cerebral infarction: Secondary | ICD-10-CM | POA: Diagnosis not present

## 2015-06-18 DIAGNOSIS — R600 Localized edema: Secondary | ICD-10-CM | POA: Diagnosis not present

## 2015-06-18 DIAGNOSIS — I69320 Aphasia following cerebral infarction: Secondary | ICD-10-CM | POA: Diagnosis not present

## 2015-06-19 DIAGNOSIS — I2510 Atherosclerotic heart disease of native coronary artery without angina pectoris: Secondary | ICD-10-CM | POA: Diagnosis not present

## 2015-06-19 DIAGNOSIS — I129 Hypertensive chronic kidney disease with stage 1 through stage 4 chronic kidney disease, or unspecified chronic kidney disease: Secondary | ICD-10-CM | POA: Diagnosis not present

## 2015-06-19 DIAGNOSIS — R600 Localized edema: Secondary | ICD-10-CM | POA: Diagnosis not present

## 2015-06-19 DIAGNOSIS — Z9181 History of falling: Secondary | ICD-10-CM | POA: Diagnosis not present

## 2015-06-19 DIAGNOSIS — F0390 Unspecified dementia without behavioral disturbance: Secondary | ICD-10-CM | POA: Diagnosis not present

## 2015-06-19 DIAGNOSIS — I69320 Aphasia following cerebral infarction: Secondary | ICD-10-CM | POA: Diagnosis not present

## 2015-06-19 DIAGNOSIS — I69398 Other sequelae of cerebral infarction: Secondary | ICD-10-CM | POA: Diagnosis not present

## 2015-06-19 DIAGNOSIS — R262 Difficulty in walking, not elsewhere classified: Secondary | ICD-10-CM | POA: Diagnosis not present

## 2015-06-19 DIAGNOSIS — R279 Unspecified lack of coordination: Secondary | ICD-10-CM | POA: Diagnosis not present

## 2015-06-19 DIAGNOSIS — N183 Chronic kidney disease, stage 3 (moderate): Secondary | ICD-10-CM | POA: Diagnosis not present

## 2015-06-19 DIAGNOSIS — N393 Stress incontinence (female) (male): Secondary | ICD-10-CM | POA: Diagnosis not present

## 2015-06-20 DIAGNOSIS — F0390 Unspecified dementia without behavioral disturbance: Secondary | ICD-10-CM | POA: Diagnosis not present

## 2015-06-20 DIAGNOSIS — N183 Chronic kidney disease, stage 3 (moderate): Secondary | ICD-10-CM | POA: Diagnosis not present

## 2015-06-20 DIAGNOSIS — N393 Stress incontinence (female) (male): Secondary | ICD-10-CM | POA: Diagnosis not present

## 2015-06-20 DIAGNOSIS — Z9181 History of falling: Secondary | ICD-10-CM | POA: Diagnosis not present

## 2015-06-20 DIAGNOSIS — R262 Difficulty in walking, not elsewhere classified: Secondary | ICD-10-CM | POA: Diagnosis not present

## 2015-06-20 DIAGNOSIS — I2510 Atherosclerotic heart disease of native coronary artery without angina pectoris: Secondary | ICD-10-CM | POA: Diagnosis not present

## 2015-06-20 DIAGNOSIS — R279 Unspecified lack of coordination: Secondary | ICD-10-CM | POA: Diagnosis not present

## 2015-06-20 DIAGNOSIS — R600 Localized edema: Secondary | ICD-10-CM | POA: Diagnosis not present

## 2015-06-20 DIAGNOSIS — I69320 Aphasia following cerebral infarction: Secondary | ICD-10-CM | POA: Diagnosis not present

## 2015-06-20 DIAGNOSIS — I129 Hypertensive chronic kidney disease with stage 1 through stage 4 chronic kidney disease, or unspecified chronic kidney disease: Secondary | ICD-10-CM | POA: Diagnosis not present

## 2015-06-20 DIAGNOSIS — I69398 Other sequelae of cerebral infarction: Secondary | ICD-10-CM | POA: Diagnosis not present

## 2015-06-27 DIAGNOSIS — R600 Localized edema: Secondary | ICD-10-CM | POA: Diagnosis not present

## 2015-06-27 DIAGNOSIS — I129 Hypertensive chronic kidney disease with stage 1 through stage 4 chronic kidney disease, or unspecified chronic kidney disease: Secondary | ICD-10-CM | POA: Diagnosis not present

## 2015-06-27 DIAGNOSIS — N393 Stress incontinence (female) (male): Secondary | ICD-10-CM | POA: Diagnosis not present

## 2015-06-27 DIAGNOSIS — I69320 Aphasia following cerebral infarction: Secondary | ICD-10-CM | POA: Diagnosis not present

## 2015-06-27 DIAGNOSIS — R262 Difficulty in walking, not elsewhere classified: Secondary | ICD-10-CM | POA: Diagnosis not present

## 2015-06-27 DIAGNOSIS — I2510 Atherosclerotic heart disease of native coronary artery without angina pectoris: Secondary | ICD-10-CM | POA: Diagnosis not present

## 2015-06-27 DIAGNOSIS — I69398 Other sequelae of cerebral infarction: Secondary | ICD-10-CM | POA: Diagnosis not present

## 2015-06-27 DIAGNOSIS — Z9181 History of falling: Secondary | ICD-10-CM | POA: Diagnosis not present

## 2015-06-27 DIAGNOSIS — N183 Chronic kidney disease, stage 3 (moderate): Secondary | ICD-10-CM | POA: Diagnosis not present

## 2015-06-27 DIAGNOSIS — F0390 Unspecified dementia without behavioral disturbance: Secondary | ICD-10-CM | POA: Diagnosis not present

## 2015-06-27 DIAGNOSIS — R279 Unspecified lack of coordination: Secondary | ICD-10-CM | POA: Diagnosis not present

## 2015-06-28 DIAGNOSIS — I2510 Atherosclerotic heart disease of native coronary artery without angina pectoris: Secondary | ICD-10-CM | POA: Diagnosis not present

## 2015-06-28 DIAGNOSIS — N393 Stress incontinence (female) (male): Secondary | ICD-10-CM | POA: Diagnosis not present

## 2015-06-28 DIAGNOSIS — F0390 Unspecified dementia without behavioral disturbance: Secondary | ICD-10-CM | POA: Diagnosis not present

## 2015-06-28 DIAGNOSIS — N183 Chronic kidney disease, stage 3 (moderate): Secondary | ICD-10-CM | POA: Diagnosis not present

## 2015-06-28 DIAGNOSIS — R600 Localized edema: Secondary | ICD-10-CM | POA: Diagnosis not present

## 2015-06-28 DIAGNOSIS — I69398 Other sequelae of cerebral infarction: Secondary | ICD-10-CM | POA: Diagnosis not present

## 2015-06-28 DIAGNOSIS — R279 Unspecified lack of coordination: Secondary | ICD-10-CM | POA: Diagnosis not present

## 2015-06-28 DIAGNOSIS — I129 Hypertensive chronic kidney disease with stage 1 through stage 4 chronic kidney disease, or unspecified chronic kidney disease: Secondary | ICD-10-CM | POA: Diagnosis not present

## 2015-06-28 DIAGNOSIS — Z9181 History of falling: Secondary | ICD-10-CM | POA: Diagnosis not present

## 2015-06-28 DIAGNOSIS — R262 Difficulty in walking, not elsewhere classified: Secondary | ICD-10-CM | POA: Diagnosis not present

## 2015-06-28 DIAGNOSIS — I69320 Aphasia following cerebral infarction: Secondary | ICD-10-CM | POA: Diagnosis not present

## 2015-06-29 DIAGNOSIS — N189 Chronic kidney disease, unspecified: Secondary | ICD-10-CM | POA: Diagnosis not present

## 2015-06-29 DIAGNOSIS — I639 Cerebral infarction, unspecified: Secondary | ICD-10-CM | POA: Diagnosis not present

## 2015-06-29 DIAGNOSIS — Z23 Encounter for immunization: Secondary | ICD-10-CM | POA: Diagnosis not present

## 2015-06-30 DIAGNOSIS — N393 Stress incontinence (female) (male): Secondary | ICD-10-CM | POA: Diagnosis not present

## 2015-06-30 DIAGNOSIS — R600 Localized edema: Secondary | ICD-10-CM | POA: Diagnosis not present

## 2015-06-30 DIAGNOSIS — N183 Chronic kidney disease, stage 3 (moderate): Secondary | ICD-10-CM | POA: Diagnosis not present

## 2015-06-30 DIAGNOSIS — I129 Hypertensive chronic kidney disease with stage 1 through stage 4 chronic kidney disease, or unspecified chronic kidney disease: Secondary | ICD-10-CM | POA: Diagnosis not present

## 2015-06-30 DIAGNOSIS — I69320 Aphasia following cerebral infarction: Secondary | ICD-10-CM | POA: Diagnosis not present

## 2015-06-30 DIAGNOSIS — R279 Unspecified lack of coordination: Secondary | ICD-10-CM | POA: Diagnosis not present

## 2015-06-30 DIAGNOSIS — I2510 Atherosclerotic heart disease of native coronary artery without angina pectoris: Secondary | ICD-10-CM | POA: Diagnosis not present

## 2015-06-30 DIAGNOSIS — Z9181 History of falling: Secondary | ICD-10-CM | POA: Diagnosis not present

## 2015-06-30 DIAGNOSIS — F0390 Unspecified dementia without behavioral disturbance: Secondary | ICD-10-CM | POA: Diagnosis not present

## 2015-06-30 DIAGNOSIS — R262 Difficulty in walking, not elsewhere classified: Secondary | ICD-10-CM | POA: Diagnosis not present

## 2015-06-30 DIAGNOSIS — I69398 Other sequelae of cerebral infarction: Secondary | ICD-10-CM | POA: Diagnosis not present

## 2015-07-04 DIAGNOSIS — I129 Hypertensive chronic kidney disease with stage 1 through stage 4 chronic kidney disease, or unspecified chronic kidney disease: Secondary | ICD-10-CM | POA: Diagnosis not present

## 2015-07-04 DIAGNOSIS — I69398 Other sequelae of cerebral infarction: Secondary | ICD-10-CM | POA: Diagnosis not present

## 2015-07-04 DIAGNOSIS — N393 Stress incontinence (female) (male): Secondary | ICD-10-CM | POA: Diagnosis not present

## 2015-07-04 DIAGNOSIS — I69320 Aphasia following cerebral infarction: Secondary | ICD-10-CM | POA: Diagnosis not present

## 2015-07-04 DIAGNOSIS — R262 Difficulty in walking, not elsewhere classified: Secondary | ICD-10-CM | POA: Diagnosis not present

## 2015-07-04 DIAGNOSIS — R600 Localized edema: Secondary | ICD-10-CM | POA: Diagnosis not present

## 2015-07-04 DIAGNOSIS — N183 Chronic kidney disease, stage 3 (moderate): Secondary | ICD-10-CM | POA: Diagnosis not present

## 2015-07-04 DIAGNOSIS — R279 Unspecified lack of coordination: Secondary | ICD-10-CM | POA: Diagnosis not present

## 2015-07-04 DIAGNOSIS — Z9181 History of falling: Secondary | ICD-10-CM | POA: Diagnosis not present

## 2015-07-04 DIAGNOSIS — F0390 Unspecified dementia without behavioral disturbance: Secondary | ICD-10-CM | POA: Diagnosis not present

## 2015-07-04 DIAGNOSIS — I2510 Atherosclerotic heart disease of native coronary artery without angina pectoris: Secondary | ICD-10-CM | POA: Diagnosis not present

## 2015-07-05 DIAGNOSIS — Z9181 History of falling: Secondary | ICD-10-CM | POA: Diagnosis not present

## 2015-07-05 DIAGNOSIS — R279 Unspecified lack of coordination: Secondary | ICD-10-CM | POA: Diagnosis not present

## 2015-07-05 DIAGNOSIS — R262 Difficulty in walking, not elsewhere classified: Secondary | ICD-10-CM | POA: Diagnosis not present

## 2015-07-05 DIAGNOSIS — I69320 Aphasia following cerebral infarction: Secondary | ICD-10-CM | POA: Diagnosis not present

## 2015-07-05 DIAGNOSIS — F0390 Unspecified dementia without behavioral disturbance: Secondary | ICD-10-CM | POA: Diagnosis not present

## 2015-07-05 DIAGNOSIS — I69398 Other sequelae of cerebral infarction: Secondary | ICD-10-CM | POA: Diagnosis not present

## 2015-07-05 DIAGNOSIS — I129 Hypertensive chronic kidney disease with stage 1 through stage 4 chronic kidney disease, or unspecified chronic kidney disease: Secondary | ICD-10-CM | POA: Diagnosis not present

## 2015-07-05 DIAGNOSIS — N393 Stress incontinence (female) (male): Secondary | ICD-10-CM | POA: Diagnosis not present

## 2015-07-05 DIAGNOSIS — N183 Chronic kidney disease, stage 3 (moderate): Secondary | ICD-10-CM | POA: Diagnosis not present

## 2015-07-05 DIAGNOSIS — I2510 Atherosclerotic heart disease of native coronary artery without angina pectoris: Secondary | ICD-10-CM | POA: Diagnosis not present

## 2015-07-05 DIAGNOSIS — R600 Localized edema: Secondary | ICD-10-CM | POA: Diagnosis not present

## 2015-07-06 DIAGNOSIS — R279 Unspecified lack of coordination: Secondary | ICD-10-CM | POA: Diagnosis not present

## 2015-07-06 DIAGNOSIS — I69320 Aphasia following cerebral infarction: Secondary | ICD-10-CM | POA: Diagnosis not present

## 2015-07-06 DIAGNOSIS — R600 Localized edema: Secondary | ICD-10-CM | POA: Diagnosis not present

## 2015-07-06 DIAGNOSIS — Z9181 History of falling: Secondary | ICD-10-CM | POA: Diagnosis not present

## 2015-07-06 DIAGNOSIS — N183 Chronic kidney disease, stage 3 (moderate): Secondary | ICD-10-CM | POA: Diagnosis not present

## 2015-07-06 DIAGNOSIS — N393 Stress incontinence (female) (male): Secondary | ICD-10-CM | POA: Diagnosis not present

## 2015-07-06 DIAGNOSIS — R262 Difficulty in walking, not elsewhere classified: Secondary | ICD-10-CM | POA: Diagnosis not present

## 2015-07-06 DIAGNOSIS — I129 Hypertensive chronic kidney disease with stage 1 through stage 4 chronic kidney disease, or unspecified chronic kidney disease: Secondary | ICD-10-CM | POA: Diagnosis not present

## 2015-07-06 DIAGNOSIS — I69398 Other sequelae of cerebral infarction: Secondary | ICD-10-CM | POA: Diagnosis not present

## 2015-07-06 DIAGNOSIS — F0390 Unspecified dementia without behavioral disturbance: Secondary | ICD-10-CM | POA: Diagnosis not present

## 2015-07-06 DIAGNOSIS — I2510 Atherosclerotic heart disease of native coronary artery without angina pectoris: Secondary | ICD-10-CM | POA: Diagnosis not present

## 2015-07-07 DIAGNOSIS — I129 Hypertensive chronic kidney disease with stage 1 through stage 4 chronic kidney disease, or unspecified chronic kidney disease: Secondary | ICD-10-CM | POA: Diagnosis not present

## 2015-07-07 DIAGNOSIS — F0390 Unspecified dementia without behavioral disturbance: Secondary | ICD-10-CM | POA: Diagnosis not present

## 2015-07-07 DIAGNOSIS — N183 Chronic kidney disease, stage 3 (moderate): Secondary | ICD-10-CM | POA: Diagnosis not present

## 2015-07-07 DIAGNOSIS — N393 Stress incontinence (female) (male): Secondary | ICD-10-CM | POA: Diagnosis not present

## 2015-07-07 DIAGNOSIS — Z9181 History of falling: Secondary | ICD-10-CM | POA: Diagnosis not present

## 2015-07-07 DIAGNOSIS — R262 Difficulty in walking, not elsewhere classified: Secondary | ICD-10-CM | POA: Diagnosis not present

## 2015-07-07 DIAGNOSIS — R600 Localized edema: Secondary | ICD-10-CM | POA: Diagnosis not present

## 2015-07-07 DIAGNOSIS — I2510 Atherosclerotic heart disease of native coronary artery without angina pectoris: Secondary | ICD-10-CM | POA: Diagnosis not present

## 2015-07-07 DIAGNOSIS — I69320 Aphasia following cerebral infarction: Secondary | ICD-10-CM | POA: Diagnosis not present

## 2015-07-07 DIAGNOSIS — I69398 Other sequelae of cerebral infarction: Secondary | ICD-10-CM | POA: Diagnosis not present

## 2015-07-07 DIAGNOSIS — R279 Unspecified lack of coordination: Secondary | ICD-10-CM | POA: Diagnosis not present

## 2015-07-11 DIAGNOSIS — I69320 Aphasia following cerebral infarction: Secondary | ICD-10-CM | POA: Diagnosis not present

## 2015-07-11 DIAGNOSIS — F0390 Unspecified dementia without behavioral disturbance: Secondary | ICD-10-CM | POA: Diagnosis not present

## 2015-07-11 DIAGNOSIS — Z9181 History of falling: Secondary | ICD-10-CM | POA: Diagnosis not present

## 2015-07-11 DIAGNOSIS — I2510 Atherosclerotic heart disease of native coronary artery without angina pectoris: Secondary | ICD-10-CM | POA: Diagnosis not present

## 2015-07-11 DIAGNOSIS — I69398 Other sequelae of cerebral infarction: Secondary | ICD-10-CM | POA: Diagnosis not present

## 2015-07-11 DIAGNOSIS — N183 Chronic kidney disease, stage 3 (moderate): Secondary | ICD-10-CM | POA: Diagnosis not present

## 2015-07-11 DIAGNOSIS — N393 Stress incontinence (female) (male): Secondary | ICD-10-CM | POA: Diagnosis not present

## 2015-07-11 DIAGNOSIS — R262 Difficulty in walking, not elsewhere classified: Secondary | ICD-10-CM | POA: Diagnosis not present

## 2015-07-11 DIAGNOSIS — I129 Hypertensive chronic kidney disease with stage 1 through stage 4 chronic kidney disease, or unspecified chronic kidney disease: Secondary | ICD-10-CM | POA: Diagnosis not present

## 2015-07-11 DIAGNOSIS — R279 Unspecified lack of coordination: Secondary | ICD-10-CM | POA: Diagnosis not present

## 2015-07-11 DIAGNOSIS — R600 Localized edema: Secondary | ICD-10-CM | POA: Diagnosis not present

## 2015-07-13 DIAGNOSIS — N393 Stress incontinence (female) (male): Secondary | ICD-10-CM | POA: Diagnosis not present

## 2015-07-13 DIAGNOSIS — N183 Chronic kidney disease, stage 3 (moderate): Secondary | ICD-10-CM | POA: Diagnosis not present

## 2015-07-13 DIAGNOSIS — F0390 Unspecified dementia without behavioral disturbance: Secondary | ICD-10-CM | POA: Diagnosis not present

## 2015-07-13 DIAGNOSIS — I2510 Atherosclerotic heart disease of native coronary artery without angina pectoris: Secondary | ICD-10-CM | POA: Diagnosis not present

## 2015-07-13 DIAGNOSIS — R262 Difficulty in walking, not elsewhere classified: Secondary | ICD-10-CM | POA: Diagnosis not present

## 2015-07-13 DIAGNOSIS — R279 Unspecified lack of coordination: Secondary | ICD-10-CM | POA: Diagnosis not present

## 2015-07-13 DIAGNOSIS — R600 Localized edema: Secondary | ICD-10-CM | POA: Diagnosis not present

## 2015-07-13 DIAGNOSIS — I129 Hypertensive chronic kidney disease with stage 1 through stage 4 chronic kidney disease, or unspecified chronic kidney disease: Secondary | ICD-10-CM | POA: Diagnosis not present

## 2015-07-13 DIAGNOSIS — I69398 Other sequelae of cerebral infarction: Secondary | ICD-10-CM | POA: Diagnosis not present

## 2015-07-13 DIAGNOSIS — I69320 Aphasia following cerebral infarction: Secondary | ICD-10-CM | POA: Diagnosis not present

## 2015-07-13 DIAGNOSIS — Z9181 History of falling: Secondary | ICD-10-CM | POA: Diagnosis not present

## 2015-07-15 DIAGNOSIS — N183 Chronic kidney disease, stage 3 (moderate): Secondary | ICD-10-CM | POA: Diagnosis not present

## 2015-07-15 DIAGNOSIS — N393 Stress incontinence (female) (male): Secondary | ICD-10-CM | POA: Diagnosis not present

## 2015-07-15 DIAGNOSIS — I129 Hypertensive chronic kidney disease with stage 1 through stage 4 chronic kidney disease, or unspecified chronic kidney disease: Secondary | ICD-10-CM | POA: Diagnosis not present

## 2015-07-15 DIAGNOSIS — R600 Localized edema: Secondary | ICD-10-CM | POA: Diagnosis not present

## 2015-07-15 DIAGNOSIS — I2510 Atherosclerotic heart disease of native coronary artery without angina pectoris: Secondary | ICD-10-CM | POA: Diagnosis not present

## 2015-07-15 DIAGNOSIS — R262 Difficulty in walking, not elsewhere classified: Secondary | ICD-10-CM | POA: Diagnosis not present

## 2015-07-15 DIAGNOSIS — I69320 Aphasia following cerebral infarction: Secondary | ICD-10-CM | POA: Diagnosis not present

## 2015-07-15 DIAGNOSIS — I69398 Other sequelae of cerebral infarction: Secondary | ICD-10-CM | POA: Diagnosis not present

## 2015-07-15 DIAGNOSIS — R279 Unspecified lack of coordination: Secondary | ICD-10-CM | POA: Diagnosis not present

## 2015-07-15 DIAGNOSIS — F0390 Unspecified dementia without behavioral disturbance: Secondary | ICD-10-CM | POA: Diagnosis not present

## 2015-07-15 DIAGNOSIS — Z9181 History of falling: Secondary | ICD-10-CM | POA: Diagnosis not present

## 2015-07-18 ENCOUNTER — Other Ambulatory Visit: Payer: Self-pay | Admitting: Internal Medicine

## 2015-07-19 DIAGNOSIS — R262 Difficulty in walking, not elsewhere classified: Secondary | ICD-10-CM | POA: Diagnosis not present

## 2015-07-19 DIAGNOSIS — N183 Chronic kidney disease, stage 3 (moderate): Secondary | ICD-10-CM | POA: Diagnosis not present

## 2015-07-19 DIAGNOSIS — R279 Unspecified lack of coordination: Secondary | ICD-10-CM | POA: Diagnosis not present

## 2015-07-19 DIAGNOSIS — I129 Hypertensive chronic kidney disease with stage 1 through stage 4 chronic kidney disease, or unspecified chronic kidney disease: Secondary | ICD-10-CM | POA: Diagnosis not present

## 2015-07-19 DIAGNOSIS — R600 Localized edema: Secondary | ICD-10-CM | POA: Diagnosis not present

## 2015-07-19 DIAGNOSIS — I69320 Aphasia following cerebral infarction: Secondary | ICD-10-CM | POA: Diagnosis not present

## 2015-07-19 DIAGNOSIS — N393 Stress incontinence (female) (male): Secondary | ICD-10-CM | POA: Diagnosis not present

## 2015-07-19 DIAGNOSIS — Z9181 History of falling: Secondary | ICD-10-CM | POA: Diagnosis not present

## 2015-07-19 DIAGNOSIS — F0390 Unspecified dementia without behavioral disturbance: Secondary | ICD-10-CM | POA: Diagnosis not present

## 2015-07-19 DIAGNOSIS — I69398 Other sequelae of cerebral infarction: Secondary | ICD-10-CM | POA: Diagnosis not present

## 2015-07-19 DIAGNOSIS — I2510 Atherosclerotic heart disease of native coronary artery without angina pectoris: Secondary | ICD-10-CM | POA: Diagnosis not present

## 2015-07-20 DIAGNOSIS — F0390 Unspecified dementia without behavioral disturbance: Secondary | ICD-10-CM | POA: Diagnosis not present

## 2015-07-20 DIAGNOSIS — I129 Hypertensive chronic kidney disease with stage 1 through stage 4 chronic kidney disease, or unspecified chronic kidney disease: Secondary | ICD-10-CM | POA: Diagnosis not present

## 2015-07-20 DIAGNOSIS — R262 Difficulty in walking, not elsewhere classified: Secondary | ICD-10-CM | POA: Diagnosis not present

## 2015-07-20 DIAGNOSIS — N393 Stress incontinence (female) (male): Secondary | ICD-10-CM | POA: Diagnosis not present

## 2015-07-20 DIAGNOSIS — R279 Unspecified lack of coordination: Secondary | ICD-10-CM | POA: Diagnosis not present

## 2015-07-20 DIAGNOSIS — I2510 Atherosclerotic heart disease of native coronary artery without angina pectoris: Secondary | ICD-10-CM | POA: Diagnosis not present

## 2015-07-20 DIAGNOSIS — R600 Localized edema: Secondary | ICD-10-CM | POA: Diagnosis not present

## 2015-07-20 DIAGNOSIS — I69398 Other sequelae of cerebral infarction: Secondary | ICD-10-CM | POA: Diagnosis not present

## 2015-07-20 DIAGNOSIS — Z9181 History of falling: Secondary | ICD-10-CM | POA: Diagnosis not present

## 2015-07-20 DIAGNOSIS — I69320 Aphasia following cerebral infarction: Secondary | ICD-10-CM | POA: Diagnosis not present

## 2015-07-20 DIAGNOSIS — N183 Chronic kidney disease, stage 3 (moderate): Secondary | ICD-10-CM | POA: Diagnosis not present

## 2015-07-21 ENCOUNTER — Other Ambulatory Visit: Payer: Self-pay | Admitting: Internal Medicine

## 2015-07-21 DIAGNOSIS — F0390 Unspecified dementia without behavioral disturbance: Secondary | ICD-10-CM | POA: Diagnosis not present

## 2015-07-21 DIAGNOSIS — N393 Stress incontinence (female) (male): Secondary | ICD-10-CM | POA: Diagnosis not present

## 2015-07-21 DIAGNOSIS — R279 Unspecified lack of coordination: Secondary | ICD-10-CM | POA: Diagnosis not present

## 2015-07-21 DIAGNOSIS — I2510 Atherosclerotic heart disease of native coronary artery without angina pectoris: Secondary | ICD-10-CM | POA: Diagnosis not present

## 2015-07-21 DIAGNOSIS — I129 Hypertensive chronic kidney disease with stage 1 through stage 4 chronic kidney disease, or unspecified chronic kidney disease: Secondary | ICD-10-CM | POA: Diagnosis not present

## 2015-07-21 DIAGNOSIS — I69398 Other sequelae of cerebral infarction: Secondary | ICD-10-CM | POA: Diagnosis not present

## 2015-07-21 DIAGNOSIS — R600 Localized edema: Secondary | ICD-10-CM | POA: Diagnosis not present

## 2015-07-21 DIAGNOSIS — Z9181 History of falling: Secondary | ICD-10-CM | POA: Diagnosis not present

## 2015-07-21 DIAGNOSIS — N183 Chronic kidney disease, stage 3 (moderate): Secondary | ICD-10-CM | POA: Diagnosis not present

## 2015-07-21 DIAGNOSIS — I69320 Aphasia following cerebral infarction: Secondary | ICD-10-CM | POA: Diagnosis not present

## 2015-07-21 DIAGNOSIS — R262 Difficulty in walking, not elsewhere classified: Secondary | ICD-10-CM | POA: Diagnosis not present

## 2015-07-22 ENCOUNTER — Other Ambulatory Visit: Payer: Self-pay | Admitting: Internal Medicine

## 2015-07-25 DIAGNOSIS — I129 Hypertensive chronic kidney disease with stage 1 through stage 4 chronic kidney disease, or unspecified chronic kidney disease: Secondary | ICD-10-CM | POA: Diagnosis not present

## 2015-07-25 DIAGNOSIS — N183 Chronic kidney disease, stage 3 (moderate): Secondary | ICD-10-CM | POA: Diagnosis not present

## 2015-07-25 DIAGNOSIS — I69320 Aphasia following cerebral infarction: Secondary | ICD-10-CM | POA: Diagnosis not present

## 2015-07-25 DIAGNOSIS — R262 Difficulty in walking, not elsewhere classified: Secondary | ICD-10-CM | POA: Diagnosis not present

## 2015-07-25 DIAGNOSIS — R279 Unspecified lack of coordination: Secondary | ICD-10-CM | POA: Diagnosis not present

## 2015-07-25 DIAGNOSIS — N393 Stress incontinence (female) (male): Secondary | ICD-10-CM | POA: Diagnosis not present

## 2015-07-25 DIAGNOSIS — F0390 Unspecified dementia without behavioral disturbance: Secondary | ICD-10-CM | POA: Diagnosis not present

## 2015-07-25 DIAGNOSIS — I69398 Other sequelae of cerebral infarction: Secondary | ICD-10-CM | POA: Diagnosis not present

## 2015-07-25 DIAGNOSIS — I2510 Atherosclerotic heart disease of native coronary artery without angina pectoris: Secondary | ICD-10-CM | POA: Diagnosis not present

## 2015-07-25 DIAGNOSIS — Z9181 History of falling: Secondary | ICD-10-CM | POA: Diagnosis not present

## 2015-07-25 DIAGNOSIS — R600 Localized edema: Secondary | ICD-10-CM | POA: Diagnosis not present

## 2015-08-04 DIAGNOSIS — F0390 Unspecified dementia without behavioral disturbance: Secondary | ICD-10-CM | POA: Diagnosis not present

## 2015-08-04 DIAGNOSIS — I69320 Aphasia following cerebral infarction: Secondary | ICD-10-CM | POA: Diagnosis not present

## 2015-08-04 DIAGNOSIS — R279 Unspecified lack of coordination: Secondary | ICD-10-CM | POA: Diagnosis not present

## 2015-08-04 DIAGNOSIS — N183 Chronic kidney disease, stage 3 (moderate): Secondary | ICD-10-CM | POA: Diagnosis not present

## 2015-08-04 DIAGNOSIS — Z9181 History of falling: Secondary | ICD-10-CM | POA: Diagnosis not present

## 2015-08-04 DIAGNOSIS — I69398 Other sequelae of cerebral infarction: Secondary | ICD-10-CM | POA: Diagnosis not present

## 2015-08-04 DIAGNOSIS — I2510 Atherosclerotic heart disease of native coronary artery without angina pectoris: Secondary | ICD-10-CM | POA: Diagnosis not present

## 2015-08-04 DIAGNOSIS — R600 Localized edema: Secondary | ICD-10-CM | POA: Diagnosis not present

## 2015-08-04 DIAGNOSIS — I129 Hypertensive chronic kidney disease with stage 1 through stage 4 chronic kidney disease, or unspecified chronic kidney disease: Secondary | ICD-10-CM | POA: Diagnosis not present

## 2015-08-04 DIAGNOSIS — N393 Stress incontinence (female) (male): Secondary | ICD-10-CM | POA: Diagnosis not present

## 2015-08-04 DIAGNOSIS — R262 Difficulty in walking, not elsewhere classified: Secondary | ICD-10-CM | POA: Diagnosis not present

## 2015-08-05 ENCOUNTER — Other Ambulatory Visit: Payer: Self-pay | Admitting: Internal Medicine

## 2015-08-09 ENCOUNTER — Ambulatory Visit: Payer: Medicare Other | Admitting: Neurology

## 2015-09-01 ENCOUNTER — Other Ambulatory Visit: Payer: Self-pay | Admitting: Internal Medicine

## 2015-09-07 ENCOUNTER — Ambulatory Visit: Payer: Medicare Other | Admitting: Neurology

## 2015-09-12 ENCOUNTER — Encounter: Payer: Self-pay | Admitting: Neurology

## 2015-09-28 DIAGNOSIS — F329 Major depressive disorder, single episode, unspecified: Secondary | ICD-10-CM | POA: Diagnosis not present

## 2015-09-28 DIAGNOSIS — Z5181 Encounter for therapeutic drug level monitoring: Secondary | ICD-10-CM | POA: Diagnosis not present

## 2015-09-28 DIAGNOSIS — F1120 Opioid dependence, uncomplicated: Secondary | ICD-10-CM | POA: Diagnosis not present

## 2015-09-28 DIAGNOSIS — E785 Hyperlipidemia, unspecified: Secondary | ICD-10-CM | POA: Diagnosis not present

## 2015-09-28 DIAGNOSIS — N390 Urinary tract infection, site not specified: Secondary | ICD-10-CM | POA: Diagnosis not present

## 2015-10-17 ENCOUNTER — Other Ambulatory Visit: Payer: Self-pay | Admitting: Internal Medicine

## 2015-11-03 ENCOUNTER — Other Ambulatory Visit: Payer: Self-pay | Admitting: Internal Medicine

## 2016-03-14 DIAGNOSIS — E7800 Pure hypercholesterolemia, unspecified: Secondary | ICD-10-CM | POA: Diagnosis not present

## 2016-03-14 DIAGNOSIS — I10 Essential (primary) hypertension: Secondary | ICD-10-CM | POA: Diagnosis not present

## 2016-03-14 DIAGNOSIS — F0390 Unspecified dementia without behavioral disturbance: Secondary | ICD-10-CM | POA: Diagnosis not present

## 2016-05-23 ENCOUNTER — Encounter (HOSPITAL_COMMUNITY): Payer: Self-pay

## 2016-05-23 ENCOUNTER — Emergency Department (HOSPITAL_COMMUNITY): Payer: Medicare Other

## 2016-05-23 ENCOUNTER — Inpatient Hospital Stay (HOSPITAL_COMMUNITY)
Admission: EM | Admit: 2016-05-23 | Discharge: 2016-05-28 | DRG: 469 | Disposition: A | Discharge status: Skilled Nursing Facility | Payer: Medicare Other | Attending: Internal Medicine | Admitting: Internal Medicine

## 2016-05-23 DIAGNOSIS — M25552 Pain in left hip: Secondary | ICD-10-CM | POA: Diagnosis not present

## 2016-05-23 DIAGNOSIS — F03918 Unspecified dementia, unspecified severity, with other behavioral disturbance: Secondary | ICD-10-CM | POA: Diagnosis present

## 2016-05-23 DIAGNOSIS — N183 Chronic kidney disease, stage 3 (moderate): Secondary | ICD-10-CM | POA: Diagnosis not present

## 2016-05-23 DIAGNOSIS — I272 Other secondary pulmonary hypertension: Secondary | ICD-10-CM | POA: Diagnosis present

## 2016-05-23 DIAGNOSIS — I5042 Chronic combined systolic (congestive) and diastolic (congestive) heart failure: Secondary | ICD-10-CM

## 2016-05-23 DIAGNOSIS — Z7982 Long term (current) use of aspirin: Secondary | ICD-10-CM | POA: Diagnosis not present

## 2016-05-23 DIAGNOSIS — R000 Tachycardia, unspecified: Secondary | ICD-10-CM | POA: Diagnosis present

## 2016-05-23 DIAGNOSIS — Z79899 Other long term (current) drug therapy: Secondary | ICD-10-CM | POA: Diagnosis not present

## 2016-05-23 DIAGNOSIS — D62 Acute posthemorrhagic anemia: Secondary | ICD-10-CM | POA: Diagnosis not present

## 2016-05-23 DIAGNOSIS — I10 Essential (primary) hypertension: Secondary | ICD-10-CM

## 2016-05-23 DIAGNOSIS — Y9301 Activity, walking, marching and hiking: Secondary | ICD-10-CM | POA: Diagnosis present

## 2016-05-23 DIAGNOSIS — R262 Difficulty in walking, not elsewhere classified: Secondary | ICD-10-CM

## 2016-05-23 DIAGNOSIS — J189 Pneumonia, unspecified organism: Secondary | ICD-10-CM

## 2016-05-23 DIAGNOSIS — F0390 Unspecified dementia without behavioral disturbance: Secondary | ICD-10-CM | POA: Diagnosis not present

## 2016-05-23 DIAGNOSIS — N179 Acute kidney failure, unspecified: Secondary | ICD-10-CM | POA: Diagnosis not present

## 2016-05-23 DIAGNOSIS — W19XXXA Unspecified fall, initial encounter: Secondary | ICD-10-CM | POA: Diagnosis present

## 2016-05-23 DIAGNOSIS — Z09 Encounter for follow-up examination after completed treatment for conditions other than malignant neoplasm: Secondary | ICD-10-CM

## 2016-05-23 DIAGNOSIS — S72002D Fracture of unspecified part of neck of left femur, subsequent encounter for closed fracture with routine healing: Secondary | ICD-10-CM | POA: Diagnosis not present

## 2016-05-23 DIAGNOSIS — I130 Hypertensive heart and chronic kidney disease with heart failure and stage 1 through stage 4 chronic kidney disease, or unspecified chronic kidney disease: Secondary | ICD-10-CM | POA: Diagnosis not present

## 2016-05-23 DIAGNOSIS — Z01818 Encounter for other preprocedural examination: Secondary | ICD-10-CM

## 2016-05-23 DIAGNOSIS — S72002A Fracture of unspecified part of neck of left femur, initial encounter for closed fracture: Secondary | ICD-10-CM | POA: Diagnosis present

## 2016-05-23 DIAGNOSIS — S72012A Unspecified intracapsular fracture of left femur, initial encounter for closed fracture: Secondary | ICD-10-CM | POA: Diagnosis not present

## 2016-05-23 DIAGNOSIS — F0391 Unspecified dementia with behavioral disturbance: Secondary | ICD-10-CM | POA: Diagnosis present

## 2016-05-23 DIAGNOSIS — W010XXA Fall on same level from slipping, tripping and stumbling without subsequent striking against object, initial encounter: Secondary | ICD-10-CM | POA: Diagnosis present

## 2016-05-23 DIAGNOSIS — E785 Hyperlipidemia, unspecified: Secondary | ICD-10-CM | POA: Diagnosis not present

## 2016-05-23 DIAGNOSIS — Z681 Body mass index (BMI) 19 or less, adult: Secondary | ICD-10-CM

## 2016-05-23 DIAGNOSIS — E876 Hypokalemia: Secondary | ICD-10-CM | POA: Diagnosis present

## 2016-05-23 DIAGNOSIS — Z419 Encounter for procedure for purposes other than remedying health state, unspecified: Secondary | ICD-10-CM

## 2016-05-23 DIAGNOSIS — E860 Dehydration: Secondary | ICD-10-CM | POA: Diagnosis not present

## 2016-05-23 DIAGNOSIS — I110 Hypertensive heart disease with heart failure: Secondary | ICD-10-CM | POA: Diagnosis present

## 2016-05-23 DIAGNOSIS — E43 Unspecified severe protein-calorie malnutrition: Secondary | ICD-10-CM | POA: Diagnosis not present

## 2016-05-23 DIAGNOSIS — Z880 Allergy status to penicillin: Secondary | ICD-10-CM | POA: Diagnosis not present

## 2016-05-23 DIAGNOSIS — F418 Other specified anxiety disorders: Secondary | ICD-10-CM | POA: Diagnosis present

## 2016-05-23 DIAGNOSIS — S72002S Fracture of unspecified part of neck of left femur, sequela: Secondary | ICD-10-CM | POA: Diagnosis not present

## 2016-05-23 HISTORY — DX: Hyperlipidemia, unspecified: E78.5

## 2016-05-23 HISTORY — DX: Other specified anxiety disorders: F41.8

## 2016-05-23 LAB — CBC WITH DIFFERENTIAL/PLATELET
Basophils Absolute: 0 10*3/uL (ref 0.0–0.1)
Basophils Relative: 0 %
EOS ABS: 0 10*3/uL (ref 0.0–0.7)
EOS PCT: 0 %
HCT: 35.8 % — ABNORMAL LOW (ref 36.0–46.0)
Hemoglobin: 11.9 g/dL — ABNORMAL LOW (ref 12.0–15.0)
LYMPHS ABS: 0.5 10*3/uL — AB (ref 0.7–4.0)
Lymphocytes Relative: 4 %
MCH: 27.2 pg (ref 26.0–34.0)
MCHC: 33.2 g/dL (ref 30.0–36.0)
MCV: 81.9 fL (ref 78.0–100.0)
MONOS PCT: 6 %
Monocytes Absolute: 0.7 10*3/uL (ref 0.1–1.0)
Neutro Abs: 9.6 10*3/uL — ABNORMAL HIGH (ref 1.7–7.7)
Neutrophils Relative %: 90 %
PLATELETS: 178 10*3/uL (ref 150–400)
RBC: 4.37 MIL/uL (ref 3.87–5.11)
RDW: 17 % — ABNORMAL HIGH (ref 11.5–15.5)
WBC: 10.8 10*3/uL — AB (ref 4.0–10.5)

## 2016-05-23 LAB — BASIC METABOLIC PANEL
Anion gap: 11 (ref 5–15)
BUN: 17 mg/dL (ref 4–21)
BUN: 17 mg/dL (ref 6–20)
CHLORIDE: 107 mmol/L (ref 101–111)
CO2: 22 mmol/L (ref 22–32)
CREATININE: 1.26 mg/dL — AB (ref 0.44–1.00)
Calcium: 9.5 mg/dL (ref 8.9–10.3)
Creatinine: 1.3 mg/dL — AB (ref 0.5–1.1)
GFR calc Af Amer: 41 mL/min — ABNORMAL LOW (ref >60)
GFR calc non Af Amer: 35 mL/min — ABNORMAL LOW (ref >60)
Glucose, Bld: 147 mg/dL — ABNORMAL HIGH (ref 65–99)
Glucose: 147 mg/dL
Potassium: 3.3 mmol/L — AB (ref 3.4–5.3)
Potassium: 3.3 mmol/L — ABNORMAL LOW (ref 3.5–5.1)
SODIUM: 140 mmol/L (ref 137–147)
SODIUM: 140 mmol/L (ref 135–145)

## 2016-05-23 LAB — PROTIME-INR
INR: 1.01
PROTHROMBIN TIME: 13.3 s (ref 11.4–15.2)

## 2016-05-23 LAB — CBC AND DIFFERENTIAL
HEMATOCRIT: 36 % (ref 36–46)
HEMOGLOBIN: 11.9 g/dL — AB (ref 12.0–16.0)
Platelets: 178 10*3/uL (ref 150–399)
WBC: 10.8 10*3/mL

## 2016-05-23 MED ORDER — FENTANYL CITRATE (PF) 100 MCG/2ML IJ SOLN
25.0000 ug | INTRAMUSCULAR | Status: DC | PRN
  Administered 2016-05-24: 25 ug via INTRAVENOUS
  Filled 2016-05-23: qty 2

## 2016-05-23 NOTE — ED Notes (Signed)
MD at bedside. 

## 2016-05-23 NOTE — ED Notes (Signed)
Bed: RESA Expected date:  Expected time:  Means of arrival:  Comments: 82 yr old, fall, HTN, hip pain

## 2016-05-23 NOTE — ED Provider Notes (Signed)
[facility]-EMERGENCY DEPT Provider Note   CSN: [id number] Arrival date & time: 05/23/16  2227  By signing my name below, I, Ariel Douglas, attest that this documentation has been prepared under the direction and in the presence of Ariel Loveless, MD . Electronically Signed: Majel Douglas, Scribe. 05/23/2016. 11:26 PM.  History   Chief Complaint Chief Complaint  Patient presents with  . Fall  . Hip Pain   The history is provided by a relative. No language interpreter was used.   HPI Comments: Ariel Douglas is a 82 y.o. female with PMHx of dementia and HTN, brought in by [facility] to the Emergency Department complaining of gradually worsening, left leg and hip pain s/p a fall that occurred at ~12:30 PM this afternoon. Per daughter, pt was walking in her bathroom when she suddenly slipped and fell on her left side. She notes she did not witness the fall but heard a loud "bang" from upstairs and immediately walked in on pt lying on the floor. She states pt was not able to get up from the floor on her own and has not ambulated since her fall. She denies striking her head or loss of consciousness. Pt's daughter reports 2 episode of vomiting this evening. She denies headache, weakness or use of blood thinners. Per daughter, pt has a walker that was given to her after a stroke but she does not normally use it to ambulate.   Past Medical History:  Diagnosis Date  . Dementia   . Hypertension    There are no active problems to display for this patient.  History reviewed. No pertinent surgical history.  OB History    No data available     Home Medications    Prior to Admission medications   Medication Sig Start Date End Date Taking? Authorizing Provider  aspirin EC 81 MG tablet Take 81 mg by mouth daily.   Yes Historical Provider, MD  atenolol (TENORMIN) 25 MG tablet Take 12.5 mg by mouth daily.   Yes Historical Provider, MD  atorvastatin (LIPITOR) 20 MG tablet Take 20 mg by mouth daily.   Yes Historical  Provider, MD  IRON PO Take 65 mg by mouth daily.   Yes Historical Provider, MD  LORazepam (ATIVAN) 1 MG tablet Take 1 mg by mouth 2 (two) times daily.   Yes Historical Provider, MD  memantine (NAMENDA) 10 MG tablet Take 10 mg by mouth 2 (two) times daily.   Yes Historical Provider, MD  mirtazapine (REMERON) 7.5 MG tablet Take 7.5 mg by mouth at bedtime.   Yes Historical Provider, MD  Multiple Vitamin (MULTIVITAMIN WITH MINERALS) TABS tablet Take 1 tablet by mouth daily.   Yes Historical Provider, MD  rosuvastatin (CRESTOR) 5 MG tablet Take 5 mg by mouth daily.   Yes Historical Provider, MD    Family History History reviewed. No pertinent family history.  Social History Social History  Substance Use Topics  . Smoking status: Not on file  . Smokeless tobacco: Not on file  . Alcohol use Not on file     Allergies   Penicillins  Review of Systems Review of Systems  Gastrointestinal: Positive for vomiting.  Musculoskeletal: Positive for arthralgias.  Neurological: Negative for syncope, weakness and headaches.   Physical Exam Updated Vital Signs BP 198/98 (BP Location: Right Arm)   Pulse 102   Temp 98 F (36.7 C) (Oral)   Resp 16   SpO2 100%   Physical Exam  Constitutional: She appears well-developed and well-nourished.  HENT:  Head: Normocephalic and atraumatic.  Right Ear: External ear normal.  Left Ear: External ear normal.  Nose: Nose normal.  Eyes: Right eye exhibits no discharge. Left eye exhibits no discharge.  Cardiovascular: Normal rate, regular rhythm and normal heart sounds.   Pulmonary/Chest: Effort normal and breath sounds normal.  Abdominal: Soft. There is no tenderness.  Musculoskeletal:  Left leg slightly shorter than right with tenderness over left hip   Neurological: She is alert.  Oriented to name and place, disoriented to time   Skin: Skin is warm and dry.  Nursing note and vitals reviewed.  ED Treatments / Results  Labs (all labs ordered are  listed, but only abnormal results are displayed) Labs Reviewed - No data to display  EKG  EKG Interpretation None      Radiology No results found.  Xray shows subcapital hip fracture - PACS issue prevents report from coming into chart.  Procedures Procedures (including critical care time)  Medications Ordered in ED Medications - No data to display  DIAGNOSTIC STUDIES:  Oxygen Saturation is 100% on RA, normal by my interpretation.    COORDINATION OF CARE:  11:24 PM Discussed treatment plan with pt and her daughter at bedside and they agreed to plan.  Initial Impression / Assessment and Plan / ED Course  I have reviewed the triage vital signs and the nursing notes.  Pertinent labs & imaging results that were available during my care of the patient were reviewed by me and considered in my medical decision making (see chart for details).  Clinical Course  Comment By Time  Xray to eval for fracture. Does not appear to have head trauma and is acting at baseline. Labs, xrays, prn pain meds.  Ariel LovelessScott Montrell Cessna, MD 09/21 2326  Comminuted subcapital hip fracture. Consult ortho, admit to hospitalist Ariel LovelessScott Tamsyn Owusu, MD 09/22 0141  D/w Dr. Aundria Rudogers. Will see in AM. Ariel LovelessScott Ranee Peasley, MD 09/22 [phone]  Dr. Clyde LundborgNiu to admit. Requests med-surg inpatient Ariel LovelessScott Aarohi Redditt, MD 09/22 0210    I personally performed the services described in this documentation, which was scribed in my presence. The recorded information has been reviewed and is accurate.   Final Clinical Impressions(s) / ED Diagnoses   Final diagnoses:  Closed left hip fracture, initial encounter [facility](HCC)    New Prescriptions New Prescriptions   No medications on file     Ariel LovelessScott Christan Defranco, MD 05/24/16 0210

## 2016-05-23 NOTE — ED Triage Notes (Signed)
Pt BIB [facility] from home c/o L hip pain following a fall around noon today. Pt fell from a standing position and landed on her L hip. Denies LOC or use of blood thinners. Hx of HTN and dementia. Pt as baseline per family to EMS.

## 2016-05-24 ENCOUNTER — Inpatient Hospital Stay (HOSPITAL_COMMUNITY): Payer: Medicare Other

## 2016-05-24 ENCOUNTER — Encounter (HOSPITAL_COMMUNITY): Payer: Self-pay | Admitting: Internal Medicine

## 2016-05-24 DIAGNOSIS — W19XXXA Unspecified fall, initial encounter: Secondary | ICD-10-CM | POA: Diagnosis not present

## 2016-05-24 DIAGNOSIS — F03918 Unspecified dementia, unspecified severity, with other behavioral disturbance: Secondary | ICD-10-CM | POA: Diagnosis present

## 2016-05-24 DIAGNOSIS — F0390 Unspecified dementia without behavioral disturbance: Secondary | ICD-10-CM | POA: Diagnosis present

## 2016-05-24 DIAGNOSIS — N179 Acute kidney failure, unspecified: Secondary | ICD-10-CM | POA: Diagnosis present

## 2016-05-24 DIAGNOSIS — R262 Difficulty in walking, not elsewhere classified: Secondary | ICD-10-CM | POA: Diagnosis not present

## 2016-05-24 DIAGNOSIS — N189 Chronic kidney disease, unspecified: Secondary | ICD-10-CM | POA: Diagnosis not present

## 2016-05-24 DIAGNOSIS — M6281 Muscle weakness (generalized): Secondary | ICD-10-CM | POA: Diagnosis not present

## 2016-05-24 DIAGNOSIS — S72012A Unspecified intracapsular fracture of left femur, initial encounter for closed fracture: Secondary | ICD-10-CM | POA: Diagnosis not present

## 2016-05-24 DIAGNOSIS — E785 Hyperlipidemia, unspecified: Secondary | ICD-10-CM | POA: Diagnosis present

## 2016-05-24 DIAGNOSIS — R011 Cardiac murmur, unspecified: Secondary | ICD-10-CM

## 2016-05-24 DIAGNOSIS — E860 Dehydration: Secondary | ICD-10-CM | POA: Diagnosis present

## 2016-05-24 DIAGNOSIS — I110 Hypertensive heart disease with heart failure: Secondary | ICD-10-CM | POA: Diagnosis present

## 2016-05-24 DIAGNOSIS — F418 Other specified anxiety disorders: Secondary | ICD-10-CM | POA: Diagnosis present

## 2016-05-24 DIAGNOSIS — Z79899 Other long term (current) drug therapy: Secondary | ICD-10-CM | POA: Diagnosis not present

## 2016-05-24 DIAGNOSIS — Z681 Body mass index (BMI) 19 or less, adult: Secondary | ICD-10-CM | POA: Diagnosis not present

## 2016-05-24 DIAGNOSIS — Z7982 Long term (current) use of aspirin: Secondary | ICD-10-CM | POA: Diagnosis not present

## 2016-05-24 DIAGNOSIS — S72002S Fracture of unspecified part of neck of left femur, sequela: Secondary | ICD-10-CM | POA: Diagnosis not present

## 2016-05-24 DIAGNOSIS — E43 Unspecified severe protein-calorie malnutrition: Secondary | ICD-10-CM | POA: Diagnosis not present

## 2016-05-24 DIAGNOSIS — G894 Chronic pain syndrome: Secondary | ICD-10-CM | POA: Diagnosis not present

## 2016-05-24 DIAGNOSIS — I2510 Atherosclerotic heart disease of native coronary artery without angina pectoris: Secondary | ICD-10-CM | POA: Diagnosis not present

## 2016-05-24 DIAGNOSIS — Z96642 Presence of left artificial hip joint: Secondary | ICD-10-CM | POA: Diagnosis not present

## 2016-05-24 DIAGNOSIS — S72002D Fracture of unspecified part of neck of left femur, subsequent encounter for closed fracture with routine healing: Secondary | ICD-10-CM | POA: Diagnosis not present

## 2016-05-24 DIAGNOSIS — Y9301 Activity, walking, marching and hiking: Secondary | ICD-10-CM | POA: Diagnosis present

## 2016-05-24 DIAGNOSIS — S72002A Fracture of unspecified part of neck of left femur, initial encounter for closed fracture: Secondary | ICD-10-CM | POA: Diagnosis present

## 2016-05-24 DIAGNOSIS — I130 Hypertensive heart and chronic kidney disease with heart failure and stage 1 through stage 4 chronic kidney disease, or unspecified chronic kidney disease: Secondary | ICD-10-CM | POA: Diagnosis present

## 2016-05-24 DIAGNOSIS — E876 Hypokalemia: Secondary | ICD-10-CM | POA: Diagnosis present

## 2016-05-24 DIAGNOSIS — D62 Acute posthemorrhagic anemia: Secondary | ICD-10-CM | POA: Diagnosis not present

## 2016-05-24 DIAGNOSIS — I272 Other secondary pulmonary hypertension: Secondary | ICD-10-CM | POA: Diagnosis present

## 2016-05-24 DIAGNOSIS — W010XXA Fall on same level from slipping, tripping and stumbling without subsequent striking against object, initial encounter: Secondary | ICD-10-CM | POA: Diagnosis not present

## 2016-05-24 DIAGNOSIS — I10 Essential (primary) hypertension: Secondary | ICD-10-CM | POA: Diagnosis not present

## 2016-05-24 DIAGNOSIS — I129 Hypertensive chronic kidney disease with stage 1 through stage 4 chronic kidney disease, or unspecified chronic kidney disease: Secondary | ICD-10-CM | POA: Diagnosis not present

## 2016-05-24 DIAGNOSIS — F0391 Unspecified dementia with behavioral disturbance: Secondary | ICD-10-CM | POA: Diagnosis present

## 2016-05-24 DIAGNOSIS — N183 Chronic kidney disease, stage 3 (moderate): Secondary | ICD-10-CM | POA: Diagnosis not present

## 2016-05-24 DIAGNOSIS — M25552 Pain in left hip: Secondary | ICD-10-CM | POA: Diagnosis present

## 2016-05-24 DIAGNOSIS — I5042 Chronic combined systolic (congestive) and diastolic (congestive) heart failure: Secondary | ICD-10-CM | POA: Diagnosis not present

## 2016-05-24 DIAGNOSIS — Z9181 History of falling: Secondary | ICD-10-CM | POA: Diagnosis not present

## 2016-05-24 DIAGNOSIS — R000 Tachycardia, unspecified: Secondary | ICD-10-CM | POA: Diagnosis present

## 2016-05-24 DIAGNOSIS — Z880 Allergy status to penicillin: Secondary | ICD-10-CM | POA: Diagnosis not present

## 2016-05-24 DIAGNOSIS — S72042A Displaced fracture of base of neck of left femur, initial encounter for closed fracture: Secondary | ICD-10-CM | POA: Diagnosis not present

## 2016-05-24 LAB — TYPE AND SCREEN
ABO/RH(D): A POS
ANTIBODY SCREEN: NEGATIVE

## 2016-05-24 LAB — CBC AND DIFFERENTIAL
HCT: 36 % (ref 36–46)
HEMOGLOBIN: 11.9 g/dL — AB (ref 12.0–16.0)
Platelets: 188 10*3/uL (ref 150–399)
WBC: 11.1 10*3/mL

## 2016-05-24 LAB — CREATININE, URINE, RANDOM: CREATININE, URINE: 35.58 mg/dL

## 2016-05-24 LAB — URINALYSIS, ROUTINE W REFLEX MICROSCOPIC
BILIRUBIN URINE: NEGATIVE
GLUCOSE, UA: NEGATIVE mg/dL
KETONES UR: NEGATIVE mg/dL
Leukocytes, UA: NEGATIVE
Nitrite: NEGATIVE
PH: 8 (ref 5.0–8.0)
Protein, ur: NEGATIVE mg/dL
Specific Gravity, Urine: 1.01 (ref 1.005–1.030)

## 2016-05-24 LAB — BASIC METABOLIC PANEL
ANION GAP: 9 (ref 5–15)
BUN: 18 mg/dL (ref 4–21)
BUN: 18 mg/dL (ref 6–20)
CHLORIDE: 107 mmol/L (ref 101–111)
CO2: 24 mmol/L (ref 22–32)
Calcium: 9.5 mg/dL (ref 8.9–10.3)
Creatinine, Ser: 1.17 mg/dL — ABNORMAL HIGH (ref 0.44–1.00)
Creatinine: 1.2 mg/dL — AB (ref 0.5–1.1)
GFR calc non Af Amer: 38 mL/min — ABNORMAL LOW (ref >60)
GFR, EST AFRICAN AMERICAN: 44 mL/min — AB (ref >60)
Glucose, Bld: 157 mg/dL — ABNORMAL HIGH (ref 65–99)
Glucose: 157 mg/dL
POTASSIUM: 3.6 mmol/L (ref 3.4–5.3)
Potassium: 3.6 mmol/L (ref 3.5–5.1)
Sodium: 140 mmol/L (ref 135–145)
Sodium: 140 mmol/L (ref 137–147)

## 2016-05-24 LAB — URINE MICROSCOPIC-ADD ON
Bacteria, UA: NONE SEEN
WBC UA: NONE SEEN WBC/hpf (ref 0–5)

## 2016-05-24 LAB — CBC
HEMATOCRIT: 35.9 % — AB (ref 36.0–46.0)
HEMOGLOBIN: 11.9 g/dL — AB (ref 12.0–15.0)
MCH: 26.9 pg (ref 26.0–34.0)
MCHC: 33.1 g/dL (ref 30.0–36.0)
MCV: 81.2 fL (ref 78.0–100.0)
Platelets: 188 10*3/uL (ref 150–400)
RBC: 4.42 MIL/uL (ref 3.87–5.11)
RDW: 17.3 % — ABNORMAL HIGH (ref 11.5–15.5)
WBC: 11.1 10*3/uL — ABNORMAL HIGH (ref 4.0–10.5)

## 2016-05-24 LAB — SURGICAL PCR SCREEN
MRSA, PCR: NEGATIVE
Staphylococcus aureus: NEGATIVE

## 2016-05-24 LAB — ECHOCARDIOGRAM COMPLETE
Height: 65 in
WEIGHTICAEL: 1537.93 [oz_av]

## 2016-05-24 LAB — SODIUM, URINE, RANDOM: SODIUM UR: 158 mmol/L

## 2016-05-24 LAB — ABO/RH: ABO/RH(D): A POS

## 2016-05-24 LAB — APTT: APTT: 23 s — AB (ref 24–36)

## 2016-05-24 MED ORDER — FERROUS SULFATE 325 (65 FE) MG PO TABS
325.0000 mg | ORAL_TABLET | Freq: Every day | ORAL | Status: DC
  Administered 2016-05-25 – 2016-05-28 (×4): 325 mg via ORAL
  Filled 2016-05-24 (×4): qty 1

## 2016-05-24 MED ORDER — POTASSIUM CHLORIDE 20 MEQ/15ML (10%) PO SOLN: 20.0000 meq | Freq: Once | ORAL | Status: DC

## 2016-05-24 MED ORDER — BISACODYL 5 MG PO TBEC: 5.0000 mg | DELAYED_RELEASE_TABLET | Freq: Every day | ORAL | Status: DC | PRN

## 2016-05-24 MED ORDER — ATENOLOL 25 MG PO TABS
12.5000 mg | ORAL_TABLET | Freq: Every day | ORAL | Status: DC
  Administered 2016-05-24 – 2016-05-26 (×3): 12.5 mg via ORAL
  Filled 2016-05-24 (×3): qty 1

## 2016-05-24 MED ORDER — MORPHINE SULFATE (PF) 2 MG/ML IV SOLN: 1.0000 mg | INTRAVENOUS | Status: DC | PRN

## 2016-05-24 MED ORDER — POTASSIUM CHLORIDE 10 MEQ/100ML IV SOLN
10.0000 meq | INTRAVENOUS | Status: AC
  Administered 2016-05-24 (×2): 10 meq via INTRAVENOUS
  Filled 2016-05-24 (×2): qty 100

## 2016-05-24 MED ORDER — MEMANTINE HCL 10 MG PO TABS
10.0000 mg | ORAL_TABLET | Freq: Two times a day (BID) | ORAL | Status: DC
  Administered 2016-05-24 – 2016-05-28 (×6): 10 mg via ORAL
  Filled 2016-05-24 (×8): qty 1

## 2016-05-24 MED ORDER — SODIUM CHLORIDE 0.9 % IV SOLN
INTRAVENOUS | Status: AC
  Administered 2016-05-24: 75 mL/h via INTRAVENOUS

## 2016-05-24 MED ORDER — ASPIRIN EC 81 MG PO TBEC
81.0000 mg | DELAYED_RELEASE_TABLET | Freq: Every day | ORAL | Status: DC
  Administered 2016-05-25: 81 mg via ORAL
  Filled 2016-05-24: qty 1

## 2016-05-24 MED ORDER — MIRTAZAPINE 15 MG PO TABS
7.5000 mg | ORAL_TABLET | Freq: Every day | ORAL | Status: DC
  Administered 2016-05-25 – 2016-05-27 (×3): 7.5 mg via ORAL
  Filled 2016-05-24 (×4): qty 1

## 2016-05-24 MED ORDER — ADULT MULTIVITAMIN W/MINERALS CH
1.0000 | ORAL_TABLET | Freq: Every day | ORAL | Status: DC
Start: 2016-05-24 — End: 2016-05-28
  Administered 2016-05-26 – 2016-05-28 (×3): 1 via ORAL
  Filled 2016-05-24 (×3): qty 1

## 2016-05-24 MED ORDER — SENNOSIDES-DOCUSATE SODIUM 8.6-50 MG PO TABS: 1.0000 | ORAL_TABLET | Freq: Every evening | ORAL | Status: DC | PRN

## 2016-05-24 MED ORDER — OXYCODONE-ACETAMINOPHEN 5-325 MG PO TABS: 1.0000 | ORAL_TABLET | ORAL | Status: DC | PRN

## 2016-05-24 MED ORDER — METHOCARBAMOL 500 MG PO TABS: 500.0000 mg | ORAL_TABLET | Freq: Three times a day (TID) | ORAL | Status: DC | PRN

## 2016-05-24 MED ORDER — ENSURE ENLIVE PO LIQD
237.0000 mL | Freq: Two times a day (BID) | ORAL | Status: DC
  Administered 2016-05-26 – 2016-05-28 (×2): 237 mL via ORAL

## 2016-05-24 MED ORDER — HYDRALAZINE HCL 20 MG/ML IJ SOLN: 5.0000 mg | INTRAMUSCULAR | Status: DC | PRN

## 2016-05-24 MED ORDER — ATORVASTATIN CALCIUM 10 MG PO TABS
20.0000 mg | ORAL_TABLET | Freq: Every day | ORAL | Status: DC
  Administered 2016-05-24 – 2016-05-27 (×4): 20 mg via ORAL
  Filled 2016-05-24: qty 2
  Filled 2016-05-24: qty 1
  Filled 2016-05-24 (×3): qty 2
  Filled 2016-05-24: qty 1

## 2016-05-24 MED ORDER — LORAZEPAM 1 MG PO TABS
1.0000 mg | ORAL_TABLET | Freq: Two times a day (BID) | ORAL | Status: DC
  Administered 2016-05-25 – 2016-05-27 (×4): 1 mg via ORAL
  Filled 2016-05-24 (×5): qty 1

## 2016-05-24 MED ORDER — ONDANSETRON HCL 4 MG/2ML IJ SOLN: 4.0000 mg | Freq: Three times a day (TID) | INTRAMUSCULAR | Status: DC | PRN

## 2016-05-24 MED ORDER — LORAZEPAM 2 MG/ML IJ SOLN
0.5000 mg | Freq: Once | INTRAMUSCULAR | Status: AC
  Administered 2016-05-24: 0.5 mg via INTRAVENOUS
  Filled 2016-05-24: qty 1

## 2016-05-24 NOTE — H&P (Signed)
History and Physical    Ariel Douglas ZOX:[id number] DOB: [birth year] DOA: 05/23/2016  Referring MD/NP/PA:   PCP: Pearson GrippeJames Kim, MD   Patient coming from:  The patient is coming from home.  At baseline, pt is partially dependent for her ADL.  Chief Complaint: left hip pain after fall  HPI: Ariel Douglas is a 82 y.o. female with medical history significant of dementia, hypertension, hyperlipidemia, depression, gastritis ID, who presents with left hip pain after fall.  Per patient's daughter, patient had an unwitnessed fall in the bathroom around noon. Not sure if she injured her head or neck. Patient did not have LOC. Patient complains of left hip pain, which is moderate, nonradiating, aggravated by movement. Patient has nausea and vomited twice per her daughter. She does not have chest pain, abdominal pain or diarrhea. No fever or chills. Denies symptoms of UTI. She moves all extremities. No vision change or hearing loss.  ED Course: pt was found to have WBC 10.8, negative urinalysis, potassium 3.3, AKI with creatinine 1.26, temperature normal, tachycardia, elevated blood pressure 198/98. X-ray of left hip showed displaced left femoral neck fracture. Pt is admitted to the MedSurg bed as inpatient. Orthopedic surgeon, Dr. Aundria Rudogers was consulted.   Review of Systems: Could not be reviewed accurately due to dementia Allergy:  Allergies  Allergen Reactions  . Penicillins Swelling    Has patient had a PCN reaction causing immediate rash, facial/tongue/throat swelling, SOB or lightheadedness with hypotension: Yes Has patient had a PCN reaction causing severe rash involving mucus membranes or skin necrosis: No Has patient had a PCN reaction that required hospitalization No Has patient had a PCN reaction occurring within the last 10 years: No If all of the above answers are "NO", then may proceed with Cephalosporin use.     Past Medical History:  Diagnosis Date  . Dementia   .  Depression with anxiety   . HLD (hyperlipidemia)   . Hypertension     Past Surgical History:  Procedure Laterality Date  . ABDOMINAL HYSTERECTOMY      Social History:  reports that she has never smoked. She does not have any smokeless tobacco history on file. She reports that she does not drink alcohol or use drugs.  Family History: Patient does not remember any family medical history due to dementia   Prior to Admission medications   Medication Sig Start Date End Date Taking? Authorizing Provider  aspirin EC 81 MG tablet Take 81 mg by mouth daily.   Yes Historical Provider, MD  atenolol (TENORMIN) 25 MG tablet Take 12.5 mg by mouth daily.   Yes Historical Provider, MD  atorvastatin (LIPITOR) 20 MG tablet Take 20 mg by mouth daily.   Yes Historical Provider, MD  IRON PO Take 65 mg by mouth daily.   Yes Historical Provider, MD  LORazepam (ATIVAN) 1 MG tablet Take 1 mg by mouth 2 (two) times daily.   Yes Historical Provider, MD  memantine (NAMENDA) 10 MG tablet Take 10 mg by mouth 2 (two) times daily.   Yes Historical Provider, MD  mirtazapine (REMERON) 7.5 MG tablet Take 7.5 mg by mouth at bedtime.   Yes Historical Provider, MD  Multiple Vitamin (MULTIVITAMIN WITH MINERALS) TABS tablet Take 1 tablet by mouth daily.   Yes Historical Provider, MD  rosuvastatin (CRESTOR) 5 MG tablet Take 5 mg by mouth daily.   Yes Historical Provider, MD    Physical Exam: Vitals:   05/23/16 2227 05/24/16 0234 05/24/16 0342  BP: 198/98 166/89 Marland Kitchen(!)  159/78  Pulse: 102 109 (!) 110  Resp: 16 22 20   Temp: 98 F (36.7 C)  99.3 F (37.4 C)  TempSrc: Oral  Oral  SpO2: 100% 97% 95%  Weight:   43.6 kg (96 lb 1.9 oz)  Height:   5\' 5"  (1.651 m)   General: Not in acute distress HEENT:       Eyes: PERRL, EOMI, no scleral icterus.       ENT: No discharge from the ears and nose, no pharynx injection, no tonsillar enlargement.        Neck: No JVD, no bruit, no mass felt. Heme: No neck lymph node  enlargement. Cardiac: S1/S2, RRR, No murmurs, No gallops or rubs. Respiratory: Good air movement bilaterally. No rales, wheezing, rhonchi or rubs. GI: Soft, nondistended, nontender, no rebound pain, no organomegaly, BS present. GU: No hematuria Ext: 1+ pitting leg edema bilaterally. 2+DP/PT pulse bilaterally. Musculoskeletal: has tenderness over leg hip Skin: No rashes.  Neuro: Alert, oriented X3, cranial nerves II-XII grossly intact, moves all extremities. Psych: Patient is not psychotic, no suicidal or hemocidal ideation.  Labs on Admission: I have personally reviewed following labs and imaging studies  CBC:  Recent Labs Lab 05/23/16 2336  WBC 10.8*  NEUTROABS 9.6*  HGB 11.9*  HCT 35.8*  MCV 81.9  PLT 178   Basic Metabolic Panel:  Recent Labs Lab 05/23/16 2336 05/24/16 0338  NA 140 140  K 3.3* 3.6  CL 107 107  CO2 22 24  GLUCOSE 147* 157*  BUN 17 18  CREATININE 1.26* 1.17*  CALCIUM 9.5 9.5   GFR: Estimated Creatinine Clearance: 19.8 mL/min (by C-G formula based on SCr of 1.17 mg/dL (H)). Liver Function Tests: No results for input(s): AST, ALT, ALKPHOS, BILITOT, PROT, ALBUMIN in the last 168 hours. No results for input(s): LIPASE, AMYLASE in the last 168 hours. No results for input(s): AMMONIA in the last 168 hours. Coagulation Profile:  Recent Labs Lab 05/23/16 2336  INR 1.01   Cardiac Enzymes: No results for input(s): CKTOTAL, CKMB, CKMBINDEX, TROPONINI in the last 168 hours. BNP (last 3 results) No results for input(s): PROBNP in the last 8760 hours. HbA1C: No results for input(s): HGBA1C in the last 72 hours. CBG: No results for input(s): GLUCAP in the last 168 hours. Lipid Profile: No results for input(s): CHOL, HDL, LDLCALC, TRIG, CHOLHDL, LDLDIRECT in the last 72 hours. Thyroid Function Tests: No results for input(s): TSH, T4TOTAL, FREET4, T3FREE, THYROIDAB in the last 72 hours. Anemia Panel: No results for input(s): VITAMINB12, FOLATE,  FERRITIN, TIBC, IRON, RETICCTPCT in the last 72 hours. Urine analysis:    Component Value Date/Time   COLORURINE YELLOW 05/24/2016 0420   APPEARANCEUR CLEAR 05/24/2016 0420   LABSPEC 1.010 05/24/2016 0420   PHURINE 8.0 05/24/2016 0420   GLUCOSEU NEGATIVE 05/24/2016 0420   HGBUR SMALL (A) 05/24/2016 0420   BILIRUBINUR NEGATIVE 05/24/2016 0420   KETONESUR NEGATIVE 05/24/2016 0420   PROTEINUR NEGATIVE 05/24/2016 0420   NITRITE NEGATIVE 05/24/2016 0420   LEUKOCYTESUR NEGATIVE 05/24/2016 0420   Sepsis Labs: @LABRCNTIP (procalcitonin:4,lacticidven:4) )No results found for this or any previous visit (from the past 240 hour(s)).   Radiological Exams on Admission: No results found.   EKG: Independently reviewed. Sinus rhythm, QTC 457, nonspecific T-wave change   Assessment/Plan Principal Problem:   Closed left hip fracture [facility]) Active Problems:   Hypertension   Dementia   Depression with anxiety   HLD (hyperlipidemia)   Fall   Hypokalemia   AKI (acute kidney  injury) (HCC)   Closed left hip fracture (HCC): As evidenced by x-ray. Patient has moderate pain now. No neurovascular compromise. Orthopedic surgeon was consulted. Dr. Aundria Rud will see pt in AM   - will admit to Med-surg bed as inpt - Pain control: morphine prn and percocet - When necessary Zofran for nausea - Robaxin for muscle spasm - type and cross - INR/PTT - CT -head and Neck  HTN: Blood pressure is elevated at 09/10/1978/98, likely due to pain -Continue atenolol -IV hydralazine when necessary  Dementia:  -Continue Namenda  Depression and anxiety: Stable, no suicidal or homicidal ideations. -Continue home medications: Mirtazapine -when necessary ativan  Hypokalemia: K=3.3 on admission. - Repleted  HLD: Last LDL was not on record -Continue home medications: Lipitor  AKI (acute kidney injury) (HCC): Creatinine 1.26, BUN 17, likely due to dehydration -Checker FeNa -Follow-up renal function by  BMP -Gentle IV fluid: Normal saline 75 mL per hour for 6 hours   DVT ppx: SCD Code Status: Full code Family Communication:  Yes, patient's  Daughter  at bed side Disposition Plan:  Anticipate discharge back to previous home environment Consults called: Orthopedic surgeon, Dr. Aundria Rud was consulted  Admission status:  medical floor/inpt Date of Service 05/24/2016    Lorretta Harp [facility] Pager [phone]  If 7PM-7AM, please contact night-coverage [email or web address] Password [email or web address] 05/24/2016, 5:30 AM

## 2016-05-24 NOTE — NC FL2 (Deleted)
[employer] MEDICAID FL2 LEVEL OF CARE SCREENING TOOL     IDENTIFICATION  Patient Name: Ariel Douglas Birthdate: 23-Mar-1919 Sex: female Admission Date (Current Location): 05/23/2016  [facility] and [place] Number:  Producer, television/film/video and Address:  [facility],  [address] [place] [address]      Provider Number: [id number]  Attending Physician Name and Address:  Calvert Cantor, MD  Relative Name and Phone Number:       Current Level of Care: Hospital Recommended Level of Care: Skilled Nursing Facility Prior Approval Number:    Date Approved/Denied:   PASRR Number:    Discharge Plan: SNF    Current Diagnoses: Patient Active Problem List   Diagnosis Date Noted  . Fall 05/24/2016  . Hypokalemia 05/24/2016  . AKI (acute kidney injury) (HCC) 05/24/2016  . Closed left hip fracture (HCC) 05/24/2016  . Hypertension   . Dementia   . Depression with anxiety   . HLD (hyperlipidemia)     Orientation RESPIRATION BLADDER Height & Weight     Self  Normal Indwelling catheter (catheter was placed in hospital) Weight: 96 lb 1.9 oz (43.6 kg) Height:  5\' 5"  (165.1 cm)  BEHAVIORAL SYMPTOMS/MOOD NEUROLOGICAL BOWEL NUTRITION STATUS      Continent Diet  AMBULATORY STATUS COMMUNICATION OF NEEDS Skin   Extensive Assist Verbally Normal                       Personal Care Assistance Level of Assistance  Bathing, Feeding, Dressing Bathing Assistance: Limited assistance Feeding assistance: Independent Dressing Assistance: Limited assistance     Functional Limitations Info  Sight, Hearing, Speech Sight Info: Adequate Hearing Info: Adequate Speech Info: Adequate    SPECIAL CARE FACTORS FREQUENCY  PT (By licensed PT), OT (By licensed OT)     PT Frequency: 5 OT Frequency: 5            Contractures Contractures Info: Not present    Additional Factors Info  Code Status, Allergies, Psychotropic Code Status Info: Full Code Allergies Info:  Penicillins Psychotropic Info: Ativan and Remeron (depression and anxiety)         Current Medications (05/24/2016):  This is the current hospital active medication list Current Facility-Administered Medications  Medication Dose Route Frequency Provider Last Rate Last Dose  . aspirin EC tablet 81 mg  81 mg Oral Daily Lorretta Harp, MD      . atenolol (TENORMIN) tablet 12.5 mg  12.5 mg Oral Daily Lorretta Harp, MD   12.5 mg at 05/24/16 1103  . atorvastatin (LIPITOR) tablet 20 mg  20 mg Oral Daily Lorretta Harp, MD      . bisacodyl (DULCOLAX) EC tablet 5 mg  5 mg Oral Daily PRN Lorretta Harp, MD      . ferrous sulfate tablet 325 mg  325 mg Oral Daily Lorretta Harp, MD      . hydrALAZINE (APRESOLINE) injection 5 mg  5 mg Intravenous Q2H PRN Lorretta Harp, MD      . LORazepam (ATIVAN) tablet 1 mg  1 mg Oral BID Lorretta Harp, MD      . memantine [facility]) tablet 10 mg  10 mg Oral BID Lorretta Harp, MD   10 mg at 05/24/16 1104  . methocarbamol (ROBAXIN) tablet 500 mg  500 mg Oral Q8H PRN Lorretta Harp, MD      . mirtazapine (REMERON) tablet 7.5 mg  7.5 mg Oral QHS Lorretta Harp, MD      . morphine  2 MG/ML injection 1 mg  1 mg Intravenous Q4H PRN Lorretta HarpXilin Niu, MD      . multivitamin with minerals tablet 1 tablet  1 tablet Oral Daily Lorretta HarpXilin Niu, MD      . ondansetron [facility](ZOFRAN) injection 4 mg  4 mg Intravenous Q8H PRN Lorretta HarpXilin Niu, MD      . oxyCODONE-acetaminophen (PERCOCET/ROXICET) 5-325 MG per tablet 1 tablet  1 tablet Oral Q4H PRN Lorretta HarpXilin Niu, MD      . senna-docusate (Senokot-S) tablet 1 tablet  1 tablet Oral QHS PRN Lorretta HarpXilin Niu, MD         Discharge Medications: Please see discharge summary for a list of discharge medications.  Relevant Imaging Results:  Relevant Lab Results:   Additional Information SS #  [id number]  [facility] :  [id number] Number   Insurance coverage  Raye SorrowCoble, Naren Benally N, [place]

## 2016-05-24 NOTE — ED Notes (Signed)
EKG given to EDP,[place],.MD., for review. 

## 2016-05-24 NOTE — Progress Notes (Signed)
PT Cancellation Note  Patient Details Name: Ariel Douglas MRN: [id number] DOB: [birth year]   Cancelled Treatment:    Reason Eval/Treat Not Completed: Medical issues which prohibited therapy. Pt admitted with hip fracture-awaiting ortho consult. Ortho MD, please reorder PT eval/WB status and activity level once pt is medically appropriate. Thanks.    Rebeca AlertJannie Mahki Spikes, MPT Pager: [phone]

## 2016-05-24 NOTE — Progress Notes (Addendum)
OT Cancellation Note  Patient Details Name: Ariel PrimesLillie Smith-Roberts MRN: [id number] DOB: Aug 07, 1919   Cancelled Treatment:    Reason Eval/Treat Not Completed: Other (comment).  Awaiting ortho consult.  Noted that pt has dementia--will check back to see if pt will benefit from OT.  Please reorder after sx.  Ulas Zuercher 05/24/2016, 7:12 AM  Marica OtterMaryellen Kentrell Guettler, OTR/L [phone] 05/24/2016

## 2016-05-24 NOTE — Progress Notes (Addendum)
LCSW aware of consult for ?discharge planning needs, home vs SNF. Patient planning for surgery on Saturday morning.  LCSW was able to [facility]#. Placed in FL2. FL2 completed and [place] Completed.   LCSW has attempted to call patient's daughter and go by room. Pastor from church spending time in room with patient, reports daughter will be here this evening.  LCSW will have weekend SW follow up with daughter for additional information and completion of psycho-social assessment.  Will follow acutely for needs and discharge planning.  Ariel EmoryHannah Tysheka Fanguy LCSW, MSW Clinical Social Work: Optician, dispensingystem [employer] Coverage for :  Ariel NiemannJaime [phone]

## 2016-05-24 NOTE — Progress Notes (Addendum)
PROGRESS NOTE    Ariel Douglas  ZOX:[id number] DOB: November 18, 1918 DOA: 05/23/2016  PCP: [name], MD   Brief Narrative:  Ariel Douglas is a 82 y.o. female with medical history significant of dementia, hypertension, hyperlipidemia, depression, gastritis ID, who presents with left hip pain after fall. According to daughter, patient had an unwitnessed fall in the bathroom. She is found to have a displaced left femoral neck fracture.  Subjective: No complaints of pain.   Review of systems: Negative  Assessment & Plan:   Principal Problem:   Closed left hip fracture -Awaiting orthopedic eval  Active Problems: Murmur -2/6 murmur at apex -No mention of this and her history -Obtaining 2-D echo - Addendum: ECHO reveals EF 40-45%, grade 2 Diastolic dysfunction, mod MR, TR and pulm HTN - will need to be careful with fluids- follow I and O - will begin ARB after surgery as long as Cr remains stable - already on Atenolol  Renal insufficiency - as there is no old lab work to compare with, it is difficult to tell if it's acute or chronic-creatinine has improved from yesterday  Hypokalemia -Improved    Hypertension - Continue atenolol    Dementia - Continue Namenda    Depression with anxiety - Remeron and Ativan twice a day   DVT prophylaxis: SCDs for now as she is awaiting surgery Code Status: Full code Family Communication:  Disposition Plan: Likely home vs skilled nursing in 2-3 days Consultants:   Orthopedic surgery Procedures:  ECHO Study Conclusions  - Left ventricle: The cavity size was normal. Systolic function was   mildly reduced. The estimated ejection fraction was in the range   of 45% to 50%. Diffuse hypokinesis. Features are consistent with   a pseudonormal left ventricular filling pattern, with concomitant   abnormal relaxation and increased filling pressure (grade 2   diastolic dysfunction). - Aortic valve: Trileaflet; normal thickness  leaflets. There was   mild regurgitation. - Aortic root: The aortic root was normal in size. - Mitral valve: Mildly thickened leaflets . There was moderate   regurgitation. - Left atrium: The atrium was normal in size. - Right ventricle: Systolic function was normal. - Right atrium: The atrium was normal in size. - Tricuspid valve: There was moderate regurgitation. - Pulmonary arteries: Systolic pressure was moderately increased.   PA peak pressure: 47 mm Hg (S). - Inferior vena cava: The vessel was normal in size. - Pericardium, extracardiac: A trivial pericardial effusion was   identified. Features were not consistent with tamponade   physiology. Antimicrobials:  Anti-infectives    None       Objective: Vitals:   05/24/16 0234 05/24/16 0342 05/24/16 0624 05/24/16 1100  BP: 166/89 (!) 159/78 (!) 176/96 (!) 170/88  Pulse: 109 (!) 110 (!) 109   Resp: 22 20 18    Temp:  99.3 F (37.4 C) 99.9 F (37.7 C)   TempSrc:  Oral Oral   SpO2: 97% 95% 97%   Weight:  43.6 kg (96 lb 1.9 oz)    Height:  5\' 5"  (1.651 m)      Intake/Output Summary (Last 24 hours) at 05/24/16 1236 Last data filed at 05/24/16 1203  Gross per 24 hour  Intake                0 ml  Output              550 ml  Net             -550  ml   Filed Weights   05/24/16 0342  Weight: 43.6 kg (96 lb 1.9 oz)    Examination: General exam: Appears comfortable  HEENT: PERRLA, oral mucosa moist, no sclera icterus or thrush Respiratory system: Clear to auscultation. Respiratory effort normal. Cardiovascular system: S1 & S2 heard, RRR.  No murmurs  Gastrointestinal system: Abdomen soft, non-tender, nondistended. Normal bowel sound. No organomegaly Central nervous system: Alert and oriented. No focal neurological deficits. Extremities: No cyanosis, clubbing or edema Skin: No rashes or ulcers Psychiatry:  Mood & affect appropriate.     Data Reviewed: I have personally reviewed following labs and imaging  studies  CBC:  Recent Labs Lab 05/23/16 2336 05/24/16 0545  WBC 10.8* 11.1*  NEUTROABS 9.6*  --   HGB 11.9* 11.9*  HCT 35.8* 35.9*  MCV 81.9 81.2  PLT 178 188   Basic Metabolic Panel:  Recent Labs Lab 05/23/16 2336 05/24/16 0338  NA 140 140  K 3.3* 3.6  CL 107 107  CO2 22 24  GLUCOSE 147* 157*  BUN 17 18  CREATININE 1.26* 1.17*  CALCIUM 9.5 9.5   GFR: Estimated Creatinine Clearance: 19.8 mL/min (by C-G formula based on SCr of 1.17 mg/dL (H)). Liver Function Tests: No results for input(s): AST, ALT, ALKPHOS, BILITOT, PROT, ALBUMIN in the last 168 hours. No results for input(s): LIPASE, AMYLASE in the last 168 hours. No results for input(s): AMMONIA in the last 168 hours. Coagulation Profile:  Recent Labs Lab 05/23/16 2336  INR 1.01   Cardiac Enzymes: No results for input(s): CKTOTAL, CKMB, CKMBINDEX, TROPONINI in the last 168 hours. BNP (last 3 results) No results for input(s): PROBNP in the last 8760 hours. HbA1C: No results for input(s): HGBA1C in the last 72 hours. CBG: No results for input(s): GLUCAP in the last 168 hours. Lipid Profile: No results for input(s): CHOL, HDL, LDLCALC, TRIG, CHOLHDL, LDLDIRECT in the last 72 hours. Thyroid Function Tests: No results for input(s): TSH, T4TOTAL, FREET4, T3FREE, THYROIDAB in the last 72 hours. Anemia Panel: No results for input(s): VITAMINB12, FOLATE, FERRITIN, TIBC, IRON, RETICCTPCT in the last 72 hours. Urine analysis:    Component Value Date/Time   COLORURINE YELLOW 05/24/2016 0420   APPEARANCEUR CLEAR 05/24/2016 0420   LABSPEC 1.010 05/24/2016 0420   PHURINE 8.0 05/24/2016 0420   GLUCOSEU NEGATIVE 05/24/2016 0420   HGBUR SMALL (A) 05/24/2016 0420   BILIRUBINUR NEGATIVE 05/24/2016 0420   KETONESUR NEGATIVE 05/24/2016 0420   PROTEINUR NEGATIVE 05/24/2016 0420   NITRITE NEGATIVE 05/24/2016 0420   LEUKOCYTESUR NEGATIVE 05/24/2016 0420   Sepsis  Labs: @LABRCNTIP (procalcitonin:4,lacticidven:4) ) Recent Results (from the past 240 hour(s))  Surgical pcr screen     Status: None   Collection Time: 05/24/16  4:24 AM  Result Value Ref Range Status   MRSA, PCR NEGATIVE NEGATIVE Final   Staphylococcus aureus NEGATIVE NEGATIVE Final    Comment:        The Xpert SA Assay (FDA approved for NASAL specimens in patients over 80 years of age), is one component of a comprehensive surveillance program.  Test performance has been validated by [facility] for patients greater than or equal to 80 year old. It is not intended to diagnose infection nor to guide or monitor treatment.          Radiology Studies: Ct Head Wo Contrast  Result Date: 05/24/2016 CLINICAL DATA:  Larey SeatFell while walking to the bathroom. Trauma to the head and neck. EXAM: CT HEAD WITHOUT CONTRAST CT CERVICAL SPINE WITHOUT CONTRAST  TECHNIQUE: Multidetector CT imaging of the head and cervical spine was performed following the standard protocol without intravenous contrast. Multiplanar CT image reconstructions of the cervical spine were also generated. COMPARISON:  None. FINDINGS: CT HEAD FINDINGS Brain: The brain shows generalized atrophy. There are chronic small-vessel ischemic changes affecting the basal ganglia and cerebral hemispheric white matter. 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 No evidence of acute infarction. No intra-axial mass lesion, hemorrhage, hydrocephalus or extra-axial collection. Vascular: There is atherosclerotic calcification of the major vessels at the base of the brain. Skull: No skull fracture. 1 cm calcification at the inner table on the right is a benign dural calcification without mass effect. Sinuses/Orbits: Clear/ normal Other: None CT CERVICAL SPINE FINDINGS Alignment: Normal Skull base and vertebrae: Negative Soft tissues and spinal canal: No soft tissue swelling. No significant soft tissue neck finding. Disc levels:  Degenerative spondylosis and facet arthropathy without evidence of advanced disease. Facets are fused on the left at C3-4. No likely significant stenosis. Upper chest: Normal Other: None IMPRESSION: Head CT: No acute or traumatic finding. Atrophy and chronic small vessel ischemic change. Cervical spine CT: No acute or traumatic finding. Ordinary age related degenerative changes without likely neural compromise. Electronically Signed   By: Paulina Fusi M.D.   On: 05/24/2016 06:59   Ct Cervical Spine Wo Contrast  Result Date: 05/24/2016 CLINICAL DATA:  Larey Seat while walking to the bathroom. Trauma to the head and neck. EXAM: CT HEAD WITHOUT CONTRAST CT CERVICAL SPINE WITHOUT CONTRAST TECHNIQUE: Multidetector CT imaging of the head and cervical spine was performed following the standard protocol without intravenous contrast. Multiplanar CT image reconstructions of the cervical spine were also generated. COMPARISON:  None. FINDINGS: CT HEAD FINDINGS Brain: The brain shows generalized atrophy. There are chronic small-vessel ischemic changes affecting the basal ganglia and cerebral hemispheric white matter. 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 No evidence of acute infarction. No intra-axial mass lesion, hemorrhage, hydrocephalus or extra-axial collection. Vascular: There is atherosclerotic calcification of the major vessels at the base of the brain. Skull: No skull fracture. 1 cm calcification at the inner table on the right is a benign dural calcification without mass effect. Sinuses/Orbits: Clear/ normal Other: None CT CERVICAL SPINE FINDINGS Alignment: Normal Skull base and vertebrae: Negative Soft tissues and spinal canal: No soft tissue swelling. No significant soft tissue neck finding. Disc levels: Degenerative spondylosis and facet arthropathy without evidence of advanced disease. Facets are fused on the left at C3-4. No likely significant stenosis. Upper chest: Normal Other: None  IMPRESSION: Head CT: No acute or traumatic finding. Atrophy and chronic small vessel ischemic change. Cervical spine CT: No acute or traumatic finding. Ordinary age related degenerative changes without likely neural compromise. Electronically Signed   By: Paulina Fusi M.D.   On: 05/24/2016 06:59      Scheduled Meds: . aspirin EC  81 mg Oral Daily  . atenolol  12.5 mg Oral Daily  . atorvastatin  20 mg Oral Daily  . ferrous sulfate  325 mg Oral Daily  . LORazepam  1 mg Oral BID  . memantine  10 mg Oral BID  . mirtazapine  7.5 mg Oral QHS  . multivitamin with minerals  1 tablet Oral Daily   Continuous Infusions:    LOS: 0 days    Time spent in minutes: 35    Jahkai Yandell, MD [facility] Pager: [email or web address] Password [facility] 05/24/2016, 12:36 PM

## 2016-05-24 NOTE — Consult Note (Signed)
ORTHOPAEDIC CONSULTATION  REQUESTING PHYSICIAN: Calvert Cantor, MD  PCP:  [name], MD  Chief Complaint: left hip pain  HPI: Ariel Douglas is a 82 y.o. female who complains of  Left hip pain, per her and her daughter.  Pt has involved dementia at baseline and the daughter, Ariel Douglas, was contacted for this history.  She sustained a ground level fall yesterday and had immediate pain and difficulty weight bearing and was brought in for evlauation, and found to have left hip, femoral neck fracture.  Her baseline function is community ambulatory.  Past Medical History:  Diagnosis Date  . Dementia   . Depression with anxiety   . HLD (hyperlipidemia)   . Hypertension    Past Surgical History:  Procedure Laterality Date  . ABDOMINAL HYSTERECTOMY     Social History   Social History  . Marital status: Widowed    Spouse name: N/A  . Number of children: N/A  . Years of education: N/A   Social History Main Topics  . Smoking status: Never Smoker  . Smokeless tobacco: Never Used  . Alcohol use No  . Drug use: No  . Sexual activity: No   Other Topics Concern  . None   Social History Narrative  . None   History reviewed. No pertinent family history. Allergies  Allergen Reactions  . Penicillins Swelling    Has patient had a PCN reaction causing immediate rash, facial/tongue/throat swelling, SOB or lightheadedness with hypotension: Yes Has patient had a PCN reaction causing severe rash involving mucus membranes or skin necrosis: No Has patient had a PCN reaction that required hospitalization No Has patient had a PCN reaction occurring within the last 10 years: No If all of the above answers are "NO", then may proceed with Cephalosporin use.    Prior to Admission medications   Medication Sig Start Date End Date Taking? Authorizing Provider  aspirin EC 81 MG tablet Take 81 mg by mouth daily.   Yes Historical Provider, MD  atenolol (TENORMIN) 25 MG tablet Take 12.5 mg  by mouth daily.   Yes Historical Provider, MD  atorvastatin (LIPITOR) 20 MG tablet Take 20 mg by mouth daily.   Yes Historical Provider, MD  IRON PO Take 65 mg by mouth daily.   Yes Historical Provider, MD  LORazepam (ATIVAN) 1 MG tablet Take 1 mg by mouth 2 (two) times daily.   Yes Historical Provider, MD  memantine (NAMENDA) 10 MG tablet Take 10 mg by mouth 2 (two) times daily.   Yes Historical Provider, MD  mirtazapine (REMERON) 7.5 MG tablet Take 7.5 mg by mouth at bedtime.   Yes Historical Provider, MD  Multiple Vitamin (MULTIVITAMIN WITH MINERALS) TABS tablet Take 1 tablet by mouth daily.   Yes Historical Provider, MD  rosuvastatin (CRESTOR) 5 MG tablet Take 5 mg by mouth daily.   Yes Historical Provider, MD   Ct Head Wo Contrast  Result Date: 05/24/2016 CLINICAL DATA:  Larey Seat while walking to the bathroom. Trauma to the head and neck. EXAM: CT HEAD WITHOUT CONTRAST CT CERVICAL SPINE WITHOUT CONTRAST TECHNIQUE: Multidetector CT imaging of the head and cervical spine was performed following the standard protocol without intravenous contrast. Multiplanar CT image reconstructions of the cervical spine were also generated. COMPARISON:  None. FINDINGS: CT HEAD FINDINGS Brain: The brain shows generalized atrophy. There are chronic small-vessel ischemic changes affecting the basal ganglia and cerebral hemispheric white matter. 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 No evidence of acute  infarction. No intra-axial mass lesion, hemorrhage, hydrocephalus or extra-axial collection. Vascular: There is atherosclerotic calcification of the major vessels at the base of the brain. Skull: No skull fracture. 1 cm calcification at the inner table on the right is a benign dural calcification without mass effect. Sinuses/Orbits: Clear/ normal Other: None CT CERVICAL SPINE FINDINGS Alignment: Normal Skull base and vertebrae: Negative Soft tissues and spinal canal: No soft tissue swelling. No  significant soft tissue neck finding. Disc levels: Degenerative spondylosis and facet arthropathy without evidence of advanced disease. Facets are fused on the left at C3-4. No likely significant stenosis. Upper chest: Normal Other: None IMPRESSION: Head CT: No acute or traumatic finding. Atrophy and chronic small vessel ischemic change. Cervical spine CT: No acute or traumatic finding. Ordinary age related degenerative changes without likely neural compromise. Electronically Signed   By: Paulina Fusi M.D.   On: 05/24/2016 06:59   Ct Cervical Spine Wo Contrast  Result Date: 05/24/2016 CLINICAL DATA:  Larey Seat while walking to the bathroom. Trauma to the head and neck. EXAM: CT HEAD WITHOUT CONTRAST CT CERVICAL SPINE WITHOUT CONTRAST TECHNIQUE: Multidetector CT imaging of the head and cervical spine was performed following the standard protocol without intravenous contrast. Multiplanar CT image reconstructions of the cervical spine were also generated. COMPARISON:  None. FINDINGS: CT HEAD FINDINGS Brain: The brain shows generalized atrophy. There are chronic small-vessel ischemic changes affecting the basal ganglia and cerebral hemispheric white matter. 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 No evidence of acute infarction. No intra-axial mass lesion, hemorrhage, hydrocephalus or extra-axial collection. Vascular: There is atherosclerotic calcification of the major vessels at the base of the brain. Skull: No skull fracture. 1 cm calcification at the inner table on the right is a benign dural calcification without mass effect. Sinuses/Orbits: Clear/ normal Other: None CT CERVICAL SPINE FINDINGS Alignment: Normal Skull base and vertebrae: Negative Soft tissues and spinal canal: No soft tissue swelling. No significant soft tissue neck finding. Disc levels: Degenerative spondylosis and facet arthropathy without evidence of advanced disease. Facets are fused on the left at C3-4. No likely  significant stenosis. Upper chest: Normal Other: None IMPRESSION: Head CT: No acute or traumatic finding. Atrophy and chronic small vessel ischemic change. Cervical spine CT: No acute or traumatic finding. Ordinary age related degenerative changes without likely neural compromise. Electronically Signed   By: Paulina Fusi M.D.   On: 05/24/2016 06:59    Positive ROS: All other systems have been reviewed and were otherwise negative with the exception of those mentioned in the HPI and as above.  Physical Exam: General: not oriented to place or time Cardiovascular: No pedal edema Respiratory: No cyanosis, no use of accessory musculature GI: No organomegaly, abdomen is soft and non-tender Skin: No lesions in the area of chief complaint Neurologic: Sensation intact distally Psychiatric: Patient is competent for consent with normal mood and affect Lymphatic: No axillary or cervical lymphadenopathy  MUSCULOSKELETAL: Left leg, shortened and ER  .  Spontaneously wiggles toes, skin wwp, 2+ DP pulse  Assessment: Left hip, femoral neck fracture with displacement  Plan: -operative intervention in the morning with Dr. Linna Caprice -NPO tonight at [place] -SCDS for dvt ppx  -NWB LLE until surgery -I have discussed the surgical plan with the daughter, Ariel Douglas [phone]. -The risks, benefits, and alternatives were discussed with the patient. There are risks associated with the surgery including, but not limited to, problems with anesthesia (death), infection, differences in leg length/angulation/rotation, fracture of bones, loosening  or failure of implants, malunion, nonunion, hematoma (blood accumulation) which may require surgical drainage, blood clots, pulmonary embolism, nerve injury (foot drop), and blood vessel injury. The patient understands these risks and elects to proceed.     Yolonda KidaJason Patrick Jiana Lemaire, MD Cell [phone]    05/24/2016 12:47 PM

## 2016-05-24 NOTE — Progress Notes (Signed)
Initial Nutrition Assessment  DOCUMENTATION CODES:   Severe malnutrition in context of chronic illness, Underweight  INTERVENTION:  Ensure Enlive po BID, each supplement provides 350 kcal and 20 grams of protein.  RD will continue to monitor intake and will assess need for more interventions at follow-up when able to meet with family.  NUTRITION DIAGNOSIS:   Inadequate oral intake related to inability to eat, poor appetite as evidenced by meal completion < 25%, per patient/family report.  GOAL:   Patient will meet greater than or equal to 90% of their needs  MONITOR:   PO intake, Supplement acceptance, Weight trends, I & O's  REASON FOR ASSESSMENT:   Consult Assessment of nutrition requirement/status  ASSESSMENT:   82 y.o. female with medical history significant of dementia, hypertension, hyperlipidemia, depression, gastritis ID, who presents with left hip pain after fall. According to daughter, patient had an unwitnessed fall in the bathroom.   Patient found to have displaced left femoral neck fracture. Plan for operative intervention in the morning.   Patient very groggy at time of assessment. She awoke while completing NFPE, but was unable to answer questions in setting of dementia. Discussed with RN. Reports patient is very confused. Daughter with be back in AM. Diet was advanced today since surgery will now be tomorrow morning, but patient refused the tray that arrived.   Medications reviewed and include: ferrous sulfate 325 mg, MVI daily.  Labs reviewed: Glucose 157.   Nutrition-Focused physical exam completed. Findings are severe fat depletion, severe muscle depletion. Did not complete exam of lower body as patient likely in a lot of pain. Could not assess edema.  Patient meets criteria for severe chronic malnutrition due to severe wasting of fat and muscle. Unable to assess nutrition and weight history. Will attempt to meet with family on Monday.   Diet Order:   Diet Heart Room service appropriate? Yes; Fluid consistency: Thin  Skin:  Reviewed, no issues  Last BM:  Unknown  Height:   Ht Readings from Last 1 Encounters:  05/24/16 5\' 5"  (1.651 m)    Weight:   Wt Readings from Last 1 Encounters:  05/24/16 96 lb 1.9 oz (43.6 kg)    Ideal Body Weight:  56.82 kg  BMI:  Body mass index is 16 kg/m.  Estimated Nutritional Needs:   Kcal:  1100-1300  Protein:  50-60 grams  Fluid:  1.1-1.3 L/day  EDUCATION NEEDS:   Education needs no appropriate at this time  Helane RimaLeanne Kai Calico, MS, RD, LDN Pager: [phone] After Hours Pager: [phone]

## 2016-05-24 NOTE — Progress Notes (Signed)
  Echocardiogram 2D Echocardiogram has been performed.  Marisue Humblelexis N Lochlyn Zullo 05/24/2016, 11:35 AM

## 2016-05-24 NOTE — [place] FL2 (Signed)
[place] MEDICAID FL2 LEVEL OF CARE SCREENING TOOL     IDENTIFICATION  Patient Name: Darice Vicario Birthdate: 09-15-1918 Sex: female Admission Date (Current Location): 05/23/2016  [facility] [place] and [place] Number:  Producer, television/film/video and Address:  [facility],  [address] [place] [address]      Provider Number: [id number]  Attending Physician Name and Address:  Calvert Cantor, MD  Relative Name and Phone Number:       Current Level of Care: Hospital Recommended Level of Care: Skilled Nursing Facility Prior Approval Number:    Date Approved/Denied:   PASRR Number:  [id number]  Discharge Plan: SNF    Current Diagnoses: Patient Active Problem List   Diagnosis Date Noted  . Fall 05/24/2016  . Hypokalemia 05/24/2016  . AKI (acute kidney injury) (HCC) 05/24/2016  . Closed left hip fracture (HCC) 05/24/2016  . Hypertension   . Dementia   . Depression with anxiety   . HLD (hyperlipidemia)     Orientation RESPIRATION BLADDER Height & Weight     Self  Normal Indwelling catheter (catheter was placed in hospital) Weight: 96 lb 1.9 oz (43.6 kg) Height:  5\' 5"  (165.1 cm)  BEHAVIORAL SYMPTOMS/MOOD NEUROLOGICAL BOWEL NUTRITION STATUS      Continent Diet  AMBULATORY STATUS COMMUNICATION OF NEEDS Skin   Extensive Assist Verbally Normal                       Personal Care Assistance Level of Assistance  Bathing, Feeding, Dressing Bathing Assistance: Limited assistance Feeding assistance: Independent Dressing Assistance: Limited assistance     Functional Limitations Info  Sight, Hearing, Speech Sight Info: Adequate Hearing Info: Adequate Speech Info: Adequate    SPECIAL CARE FACTORS FREQUENCY  PT (By licensed PT), OT (By licensed OT)     PT Frequency: 5 OT Frequency: 5            Contractures Contractures Info: Not present    Additional Factors Info  Code Status, Allergies, Psychotropic Code Status Info: Full Code Allergies  Info: Penicillins Psychotropic Info: Ativan and Remeron (depression and anxiety)         Current Medications (05/24/2016):  This is the current hospital active medication list Current Facility-Administered Medications  Medication Dose Route Frequency Provider Last Rate Last Dose  . aspirin EC tablet 81 mg  81 mg Oral Daily Lorretta Harp, MD      . atenolol (TENORMIN) tablet 12.5 mg  12.5 mg Oral Daily Lorretta Harp, MD   12.5 mg at 05/24/16 1103  . atorvastatin (LIPITOR) tablet 20 mg  20 mg Oral Daily Lorretta Harp, MD      . bisacodyl (DULCOLAX) EC tablet 5 mg  5 mg Oral Daily PRN Lorretta Harp, MD      . ferrous sulfate tablet 325 mg  325 mg Oral Daily Lorretta Harp, MD      . hydrALAZINE (APRESOLINE) injection 5 mg  5 mg Intravenous Q2H PRN Lorretta Harp, MD      . LORazepam (ATIVAN) tablet 1 mg  1 mg Oral BID Lorretta Harp, MD      . memantine [facility]) tablet 10 mg  10 mg Oral BID Lorretta Harp, MD   10 mg at 05/24/16 1104  . methocarbamol (ROBAXIN) tablet 500 mg  500 mg Oral Q8H PRN Lorretta Harp, MD      . mirtazapine (REMERON) tablet 7.5 mg  7.5 mg Oral QHS Lorretta Harp, MD      . morphine  2 MG/ML injection 1 mg  1 mg Intravenous Q4H PRN Lorretta HarpXilin Niu, MD      . multivitamin with minerals tablet 1 tablet  1 tablet Oral Daily Lorretta HarpXilin Niu, MD      . ondansetron [employer](ZOFRAN) injection 4 mg  4 mg Intravenous Q8H PRN Lorretta HarpXilin Niu, MD      . oxyCODONE-acetaminophen (PERCOCET/ROXICET) 5-325 MG per tablet 1 tablet  1 tablet Oral Q4H PRN Lorretta HarpXilin Niu, MD      . senna-docusate (Senokot-S) tablet 1 tablet  1 tablet Oral QHS PRN Lorretta HarpXilin Niu, MD         Discharge Medications: Please see discharge summary for a list of discharge medications.  Relevant Imaging Results:  Relevant Lab Results:   Additional Information SS # [id number] Medicare UHC Number:  [id number]  Raye SorrowCoble, Marnisha Stampley N, LCSW

## 2016-05-25 ENCOUNTER — Inpatient Hospital Stay (HOSPITAL_COMMUNITY): Payer: Medicare Other

## 2016-05-25 ENCOUNTER — Encounter (HOSPITAL_COMMUNITY)
Admission: EM | Disposition: A | Discharge status: Skilled Nursing Facility | Payer: Self-pay | Source: Home / Self Care | Attending: Internal Medicine

## 2016-05-25 ENCOUNTER — Inpatient Hospital Stay (HOSPITAL_COMMUNITY): Payer: Medicare Other | Admitting: Anesthesiology

## 2016-05-25 DIAGNOSIS — F0390 Unspecified dementia without behavioral disturbance: Secondary | ICD-10-CM

## 2016-05-25 DIAGNOSIS — N179 Acute kidney failure, unspecified: Secondary | ICD-10-CM

## 2016-05-25 DIAGNOSIS — E43 Unspecified severe protein-calorie malnutrition: Secondary | ICD-10-CM

## 2016-05-25 DIAGNOSIS — E785 Hyperlipidemia, unspecified: Secondary | ICD-10-CM

## 2016-05-25 DIAGNOSIS — E876 Hypokalemia: Secondary | ICD-10-CM

## 2016-05-25 DIAGNOSIS — S72002A Fracture of unspecified part of neck of left femur, initial encounter for closed fracture: Secondary | ICD-10-CM | POA: Diagnosis present

## 2016-05-25 DIAGNOSIS — S72002S Fracture of unspecified part of neck of left femur, sequela: Secondary | ICD-10-CM

## 2016-05-25 DIAGNOSIS — I10 Essential (primary) hypertension: Secondary | ICD-10-CM

## 2016-05-25 HISTORY — PX: ANTERIOR APPROACH HEMI HIP ARTHROPLASTY: SHX6690

## 2016-05-25 HISTORY — DX: Fracture of unspecified part of neck of left femur, initial encounter for closed fracture: S72.002A

## 2016-05-25 HISTORY — DX: Unspecified severe protein-calorie malnutrition: E43

## 2016-05-25 LAB — CBC
HEMATOCRIT: 32.7 % — AB (ref 36.0–46.0)
Hemoglobin: 10.7 g/dL — ABNORMAL LOW (ref 12.0–15.0)
MCH: 26.6 pg (ref 26.0–34.0)
MCHC: 32.7 g/dL (ref 30.0–36.0)
MCV: 81.3 fL (ref 78.0–100.0)
PLATELETS: 172 10*3/uL (ref 150–400)
RBC: 4.02 MIL/uL (ref 3.87–5.11)
RDW: 17.4 % — ABNORMAL HIGH (ref 11.5–15.5)
WBC: 9.2 10*3/uL (ref 4.0–10.5)

## 2016-05-25 LAB — BASIC METABOLIC PANEL
Anion gap: 8 (ref 5–15)
BUN: 20 mg/dL (ref 4–21)
BUN: 20 mg/dL (ref 6–20)
CHLORIDE: 109 mmol/L (ref 101–111)
CO2: 20 mmol/L — AB (ref 22–32)
CREATININE: 1.3 mg/dL — AB (ref 0.5–1.1)
CREATININE: 1.29 mg/dL — AB (ref 0.44–1.00)
Calcium: 9 mg/dL (ref 8.9–10.3)
GFR calc non Af Amer: 34 mL/min — ABNORMAL LOW (ref >60)
GFR, EST AFRICAN AMERICAN: 39 mL/min — AB (ref >60)
GLUCOSE: 123 mg/dL
Glucose, Bld: 123 mg/dL — ABNORMAL HIGH (ref 65–99)
POTASSIUM: 3.7 mmol/L (ref 3.4–5.3)
Potassium: 3.7 mmol/L (ref 3.5–5.1)
SODIUM: 137 mmol/L (ref 137–147)
Sodium: 137 mmol/L (ref 135–145)

## 2016-05-25 LAB — CBC AND DIFFERENTIAL
HCT: 33 % — AB (ref 36–46)
HEMOGLOBIN: 10.7 g/dL — AB (ref 12.0–16.0)
Platelets: 172 10*3/uL (ref 150–399)
WBC: 9.2 10*3/mL

## 2016-05-25 SURGERY — HEMIARTHROPLASTY, HIP, DIRECT ANTERIOR APPROACH, FOR FRACTURE
Anesthesia: General | Site: Hip | Laterality: Left

## 2016-05-25 MED ORDER — PHENYLEPHRINE HCL 10 MG/ML IJ SOLN
INTRAMUSCULAR | Status: DC | PRN
  Administered 2016-05-25 (×2): 80 ug via INTRAVENOUS

## 2016-05-25 MED ORDER — ASPIRIN EC 81 MG PO TBEC
81.0000 mg | DELAYED_RELEASE_TABLET | Freq: Two times a day (BID) | ORAL | Status: DC
  Administered 2016-05-26 – 2016-05-28 (×3): 81 mg via ORAL
  Filled 2016-05-25 (×3): qty 1

## 2016-05-25 MED ORDER — SODIUM CHLORIDE 0.9 % IJ SOLN
INTRAMUSCULAR | Status: AC
  Filled 2016-05-25: qty 50

## 2016-05-25 MED ORDER — SODIUM CHLORIDE 0.9 % IR SOLN
Status: DC | PRN
  Administered 2016-05-25: 1000 mL

## 2016-05-25 MED ORDER — CLINDAMYCIN PHOSPHATE 900 MG/50ML IV SOLN
INTRAVENOUS | Status: AC
  Filled 2016-05-25: qty 50

## 2016-05-25 MED ORDER — CLINDAMYCIN PHOSPHATE 600 MG/50ML IV SOLN
600.0000 mg | Freq: Four times a day (QID) | INTRAVENOUS | Status: AC
  Administered 2016-05-25 – 2016-05-26 (×2): 600 mg via INTRAVENOUS
  Filled 2016-05-25 (×2): qty 50

## 2016-05-25 MED ORDER — HYDROCODONE-ACETAMINOPHEN 5-325 MG PO TABS
1.0000 | ORAL_TABLET | Freq: Four times a day (QID) | ORAL | Status: DC | PRN
  Administered 2016-05-27: 1 via ORAL
  Filled 2016-05-25: qty 1

## 2016-05-25 MED ORDER — CLINDAMYCIN PHOSPHATE 900 MG/50ML IV SOLN
900.0000 mg | INTRAVENOUS | Status: AC
  Administered 2016-05-25: 900 mg via INTRAVENOUS

## 2016-05-25 MED ORDER — ONDANSETRON HCL 4 MG/2ML IJ SOLN
INTRAMUSCULAR | Status: DC | PRN
  Administered 2016-05-25: 4 mg via INTRAVENOUS

## 2016-05-25 MED ORDER — DEXAMETHASONE SODIUM PHOSPHATE 10 MG/ML IJ SOLN
INTRAMUSCULAR | Status: AC
  Filled 2016-05-25: qty 1

## 2016-05-25 MED ORDER — ROCURONIUM BROMIDE 10 MG/ML (PF) SYRINGE
PREFILLED_SYRINGE | INTRAVENOUS | Status: DC | PRN
  Administered 2016-05-25: 30 mg via INTRAVENOUS

## 2016-05-25 MED ORDER — WATER FOR IRRIGATION, STERILE IR SOLN
Status: DC | PRN
  Administered 2016-05-25: 2000 mL via SURGICAL_CAVITY

## 2016-05-25 MED ORDER — MUPIROCIN 2 % EX OINT
TOPICAL_OINTMENT | CUTANEOUS | Status: AC
  Administered 2016-05-25: 09:00:00
  Filled 2016-05-25: qty 22

## 2016-05-25 MED ORDER — PHENOL 1.4 % MT LIQD
1.0000 | OROMUCOSAL | Status: DC | PRN
Start: 2016-05-25 — End: 2016-05-28

## 2016-05-25 MED ORDER — FENTANYL CITRATE (PF) 100 MCG/2ML IJ SOLN: 25.0000 ug | INTRAMUSCULAR | Status: DC | PRN

## 2016-05-25 MED ORDER — CHLORHEXIDINE GLUCONATE 4 % EX LIQD
60.0000 mL | Freq: Once | CUTANEOUS | Status: DC
  Filled 2016-05-25: qty 60

## 2016-05-25 MED ORDER — ONDANSETRON HCL 4 MG PO TABS: 4.0000 mg | ORAL_TABLET | Freq: Four times a day (QID) | ORAL | Status: DC | PRN

## 2016-05-25 MED ORDER — METOCLOPRAMIDE HCL 5 MG/ML IJ SOLN: 5.0000 mg | Freq: Three times a day (TID) | INTRAMUSCULAR | Status: DC | PRN

## 2016-05-25 MED ORDER — LIDOCAINE 2% (20 MG/ML) 5 ML SYRINGE
INTRAMUSCULAR | Status: DC | PRN
  Administered 2016-05-25: 100 mg via INTRAVENOUS

## 2016-05-25 MED ORDER — PROPOFOL 10 MG/ML IV BOLUS
INTRAVENOUS | Status: DC | PRN
  Administered 2016-05-25: 50 mg via INTRAVENOUS

## 2016-05-25 MED ORDER — TRANEXAMIC ACID 1000 MG/10ML IV SOLN
1000.0000 mg | INTRAVENOUS | Status: AC
  Administered 2016-05-25: 1000 mg via INTRAVENOUS
  Filled 2016-05-25: qty 10

## 2016-05-25 MED ORDER — ACETAMINOPHEN 650 MG RE SUPP: 650.0000 mg | Freq: Four times a day (QID) | RECTAL | Status: DC | PRN

## 2016-05-25 MED ORDER — ETOMIDATE 2 MG/ML IV SOLN
INTRAVENOUS | Status: DC | PRN
  Administered 2016-05-25: 5 mg via INTRAVENOUS

## 2016-05-25 MED ORDER — PROPOFOL 10 MG/ML IV BOLUS
INTRAVENOUS | Status: AC
  Filled 2016-05-25: qty 20

## 2016-05-25 MED ORDER — SUCCINYLCHOLINE CHLORIDE 200 MG/10ML IV SOSY
PREFILLED_SYRINGE | INTRAVENOUS | Status: DC | PRN
  Administered 2016-05-25: 100 mg via INTRAVENOUS

## 2016-05-25 MED ORDER — PHENYLEPHRINE 40 MCG/ML (10ML) SYRINGE FOR IV PUSH (FOR BLOOD PRESSURE SUPPORT)
PREFILLED_SYRINGE | INTRAVENOUS | Status: AC
  Filled 2016-05-25: qty 10

## 2016-05-25 MED ORDER — ISOPROPYL ALCOHOL 70 % SOLN
Status: DC | PRN
  Administered 2016-05-25: 1 via TOPICAL

## 2016-05-25 MED ORDER — DEXAMETHASONE SODIUM PHOSPHATE 10 MG/ML IJ SOLN
INTRAMUSCULAR | Status: DC | PRN
  Administered 2016-05-25: 10 mg via INTRAVENOUS

## 2016-05-25 MED ORDER — BUPIVACAINE HCL (PF) 0.25 % IJ SOLN
INTRAMUSCULAR | Status: AC
  Filled 2016-05-25: qty 30

## 2016-05-25 MED ORDER — MENTHOL 3 MG MT LOZG: 1.0000 | LOZENGE | OROMUCOSAL | Status: DC | PRN

## 2016-05-25 MED ORDER — SUGAMMADEX SODIUM 200 MG/2ML IV SOLN
INTRAVENOUS | Status: AC
  Filled 2016-05-25: qty 2

## 2016-05-25 MED ORDER — ACETAMINOPHEN 10 MG/ML IV SOLN
INTRAVENOUS | Status: AC
  Filled 2016-05-25: qty 100

## 2016-05-25 MED ORDER — ONDANSETRON HCL 4 MG/2ML IJ SOLN
INTRAMUSCULAR | Status: AC
  Filled 2016-05-25: qty 2

## 2016-05-25 MED ORDER — SODIUM CHLORIDE 0.9 % IJ SOLN
INTRAMUSCULAR | Status: DC | PRN
  Administered 2016-05-25: 30 mL

## 2016-05-25 MED ORDER — PHENYLEPHRINE HCL 10 MG/ML IJ SOLN
INTRAMUSCULAR | Status: AC
  Filled 2016-05-25: qty 1

## 2016-05-25 MED ORDER — ACETAMINOPHEN 10 MG/ML IV SOLN: 1000.0000 mg | Freq: Once | INTRAVENOUS | Status: DC

## 2016-05-25 MED ORDER — LIDOCAINE 2% (20 MG/ML) 5 ML SYRINGE
INTRAMUSCULAR | Status: AC
Start: 2016-05-25 — End: 2016-05-25
  Filled 2016-05-25: qty 5

## 2016-05-25 MED ORDER — ONDANSETRON HCL 4 MG/2ML IJ SOLN: 4.0000 mg | Freq: Four times a day (QID) | INTRAMUSCULAR | Status: DC | PRN

## 2016-05-25 MED ORDER — KETOROLAC TROMETHAMINE 30 MG/ML IJ SOLN
INTRAMUSCULAR | Status: DC | PRN
  Administered 2016-05-25: 30 mg via INTRAMUSCULAR

## 2016-05-25 MED ORDER — KETOROLAC TROMETHAMINE 30 MG/ML IJ SOLN
INTRAMUSCULAR | Status: AC
  Filled 2016-05-25: qty 1

## 2016-05-25 MED ORDER — SUGAMMADEX SODIUM 200 MG/2ML IV SOLN
INTRAVENOUS | Status: DC | PRN
Start: 2016-05-25 — End: 2016-05-25
  Administered 2016-05-25: 100 mg via INTRAVENOUS

## 2016-05-25 MED ORDER — ACETAMINOPHEN 325 MG PO TABS
650.0000 mg | ORAL_TABLET | Freq: Four times a day (QID) | ORAL | Status: DC | PRN
Start: 2016-05-25 — End: 2016-05-28

## 2016-05-25 MED ORDER — SODIUM CHLORIDE 0.9 % IV SOLN
INTRAVENOUS | Status: DC | PRN
  Administered 2016-05-25: 25 ug/min via INTRAVENOUS

## 2016-05-25 MED ORDER — FENTANYL CITRATE (PF) 100 MCG/2ML IJ SOLN
INTRAMUSCULAR | Status: DC | PRN
  Administered 2016-05-25 (×2): 25 ug via INTRAVENOUS

## 2016-05-25 MED ORDER — FENTANYL CITRATE (PF) 100 MCG/2ML IJ SOLN
INTRAMUSCULAR | Status: AC
  Filled 2016-05-25: qty 2

## 2016-05-25 MED ORDER — ETOMIDATE 2 MG/ML IV SOLN
INTRAVENOUS | Status: AC
Start: 2016-05-25 — End: 2016-05-25
  Filled 2016-05-25: qty 10

## 2016-05-25 MED ORDER — BUPIVACAINE HCL (PF) 0.25 % IJ SOLN
INTRAMUSCULAR | Status: DC | PRN
  Administered 2016-05-25: 20 mL

## 2016-05-25 MED ORDER — ROCURONIUM BROMIDE 10 MG/ML (PF) SYRINGE
PREFILLED_SYRINGE | INTRAVENOUS | Status: AC
  Filled 2016-05-25: qty 10

## 2016-05-25 MED ORDER — LACTATED RINGERS IV SOLN
INTRAVENOUS | Status: DC | PRN
  Administered 2016-05-25 (×2): via INTRAVENOUS

## 2016-05-25 MED ORDER — METOCLOPRAMIDE HCL 5 MG PO TABS: 5.0000 mg | ORAL_TABLET | Freq: Three times a day (TID) | ORAL | Status: DC | PRN

## 2016-05-25 SURGICAL SUPPLY — 41 items
BAG DECANTER FOR FLEXI CONT (MISCELLANEOUS) IMPLANT
BAG ZIPLOCK 12X15 (MISCELLANEOUS) IMPLANT
CAPT HIP HEMI 2 ×3 IMPLANT
CHLORAPREP W/TINT 26ML (MISCELLANEOUS) ×3 IMPLANT
CLOTH BEACON ORANGE TIMEOUT ST (SAFETY) ×3 IMPLANT
COVER PERINEAL POST (MISCELLANEOUS) ×3 IMPLANT
DECANTER SPIKE VIAL GLASS SM (MISCELLANEOUS) ×3 IMPLANT
DRAPE SHEET LG 3/4 BI-LAMINATE (DRAPES) ×6 IMPLANT
DRAPE STERI IOBAN 125X83 (DRAPES) ×3 IMPLANT
DRAPE U-SHAPE 47X51 STRL (DRAPES) ×6 IMPLANT
DRSG AQUACEL AG ADV 3.5X10 (GAUZE/BANDAGES/DRESSINGS) ×3 IMPLANT
ELECT PENCIL ROCKER SW 15FT (MISCELLANEOUS) ×3 IMPLANT
ELECT REM PT RETURN 15FT ADLT (MISCELLANEOUS) ×3 IMPLANT
GAUZE SPONGE 4X4 12PLY STRL (GAUZE/BANDAGES/DRESSINGS) ×3 IMPLANT
GLOVE BIO SURGEON STRL SZ8.5 (GLOVE) ×6 IMPLANT
GLOVE BIOGEL PI IND STRL 8.5 (GLOVE) ×1 IMPLANT
GLOVE BIOGEL PI INDICATOR 8.5 (GLOVE) ×2
GOWN SPEC L3 XXLG W/TWL (GOWN DISPOSABLE) ×3 IMPLANT
HANDPIECE INTERPULSE COAX TIP (DISPOSABLE) ×2
HOLDER FOLEY CATH W/STRAP (MISCELLANEOUS) ×3 IMPLANT
HOOD PEEL AWAY FLYTE STAYCOOL (MISCELLANEOUS) ×6 IMPLANT
LIQUID BAND (GAUZE/BANDAGES/DRESSINGS) ×6 IMPLANT
MARKER SKIN DUAL TIP RULER LAB (MISCELLANEOUS) ×3 IMPLANT
NEEDLE SPNL 18GX3.5 QUINCKE PK (NEEDLE) ×3 IMPLANT
PACK ANTERIOR HIP CUSTOM (KITS) ×3 IMPLANT
SAW OSC TIP CART 19.5X105X1.3 (SAW) ×3 IMPLANT
SEALER BIPOLAR AQUA 6.0 (INSTRUMENTS) ×3 IMPLANT
SET HNDPC FAN SPRY TIP SCT (DISPOSABLE) ×1 IMPLANT
SOL PREP POV-IOD 4OZ 10% (MISCELLANEOUS) ×3 IMPLANT
SUT ETHIBOND NAB CT1 #1 30IN (SUTURE) ×6 IMPLANT
SUT MNCRL AB 3-0 PS2 18 (SUTURE) ×3 IMPLANT
SUT MON AB 2-0 CT1 36 (SUTURE) ×6 IMPLANT
SUT STRATAFIX PDO 1 14 VIOLET (SUTURE) ×2
SUT STRATFX PDO 1 14 VIOLET (SUTURE) ×1
SUT VIC AB 2-0 CT1 27 (SUTURE) ×2
SUT VIC AB 2-0 CT1 TAPERPNT 27 (SUTURE) ×1 IMPLANT
SUTURE STRATFX PDO 1 14 VIOLET (SUTURE) ×1 IMPLANT
SYR 50ML LL SCALE MARK (SYRINGE) ×3 IMPLANT
TRAY FOLEY W/METER SILVER 16FR (SET/KITS/TRAYS/PACK) IMPLANT
WATER STERILE IRR 1500ML POUR (IV SOLUTION) ×3 IMPLANT
YANKAUER SUCT BULB TIP 10FT TU (MISCELLANEOUS) ×3 IMPLANT

## 2016-05-25 NOTE — Interval H&P Note (Signed)
History and Physical Interval Note:  05/25/2016 10:07 AM  Ariel ClarkLillie Douglas  has presented today for surgery, with the diagnosis of left hip fracture  The various methods of treatment have been discussed with the patient and family. After consideration of risks, benefits and other options for treatment, the patient has consented to  Procedure(s): ANTERIOR APPROACH HEMI HIP ARTHROPLASTY (Left) as a surgical intervention .  The patient's history has been reviewed, patient examined, no change in status, stable for surgery.  I have reviewed the patient's chart and labs.  Questions were answered to the patient's satisfaction.    Asked to assume care by Dr. Aundria Rudogers.  The risks, benefits, and alternatives were discussed with the patient. There are risks associated with the surgery including, but not limited to, problems with anesthesia (death), infection, instability (giving out of the joint), dislocation, differences in leg length/angulation/rotation, fracture of bones, loosening or failure of implants, hematoma (blood accumulation) which may require surgical drainage, blood clots, pulmonary embolism, nerve injury (foot drop and lateral thigh numbness), and blood vessel injury. The patient understands these risks and elects to proceed.    Ariel Douglas, Ariel ReamsBrian Douglas

## 2016-05-25 NOTE — Anesthesia Preprocedure Evaluation (Addendum)
Anesthesia Evaluation  Patient identified by MRN, date of birth, ID band Patient awake    Reviewed: Allergy & Precautions, NPO status , Patient's Chart, lab work & pertinent test results  Airway Mallampati: II  TM Distance: >3 FB     Dental   Pulmonary neg pulmonary ROS,    breath sounds clear to auscultation       Cardiovascular hypertension,  Rhythm:Regular Rate:Normal     Neuro/Psych    GI/Hepatic negative GI ROS, Neg liver ROS,   Endo/Other    Renal/GU Renal disease     Musculoskeletal   Abdominal   Peds  Hematology   Anesthesia Other Findings   Reproductive/Obstetrics                            Anesthesia Physical Anesthesia Plan  ASA: III  Anesthesia Plan: General   Post-op Pain Management:    Induction: Intravenous  Airway Management Planned: Oral ETT  Additional Equipment:   Intra-op Plan:   Post-operative Plan: Possible Post-op intubation/ventilation  Informed Consent: I have reviewed the patients History and Physical, chart, labs and discussed the procedure including the risks, benefits and alternatives for the proposed anesthesia with the patient or authorized representative who has indicated his/her understanding and acceptance.   Dental advisory given  Plan Discussed with: Anesthesiologist and CRNA  Anesthesia Plan Comments:        Anesthesia Quick Evaluation

## 2016-05-25 NOTE — Transfer of Care (Signed)
Immediate Anesthesia Transfer of Care Note  Patient: Ariel ClarkLillie Smith-Roberts  Procedure(s) Performed: Procedure(s): ANTERIOR APPROACH HEMI HIP ARTHROPLASTY (Left)  Patient Location: PACU  Anesthesia Type:General  Level of Consciousness: awake, alert , oriented and patient cooperative  Airway & Oxygen Therapy: Patient Spontanous Breathing and Patient connected to face mask oxygen  Post-op Assessment: Report given to RN, Post -op Vital signs reviewed and stable and Patient moving all extremities X 4  Post vital signs: stable  Last Vitals:  Vitals:   05/25/16 0554 05/25/16 0905  BP: (!) 156/85 (!) 156/78  Pulse: 98 90  Resp: 18 18  Temp: 36.6 C 37.1 C    Last Pain:  Vitals:   05/25/16 0905  TempSrc: Axillary  PainSc:          Complications: No apparent anesthesia complications

## 2016-05-25 NOTE — H&P (View-Only) (Signed)
ORTHOPAEDIC CONSULTATION  REQUESTING PHYSICIAN: Calvert Cantor, MD  PCP:  [name], MD  Chief Complaint: left hip pain  HPI: Ariel Douglas is a 82 y.o. female who complains of  Left hip pain, per her and her daughter.  Pt has involved dementia at baseline and the daughter, Ariel Douglas, was contacted for this history.  She sustained a ground level fall yesterday and had immediate pain and difficulty weight bearing and was brought in for evlauation, and found to have left hip, femoral neck fracture.  Her baseline function is community ambulatory.  Past Medical History:  Diagnosis Date  . Dementia   . Depression with anxiety   . HLD (hyperlipidemia)   . Hypertension    Past Surgical History:  Procedure Laterality Date  . ABDOMINAL HYSTERECTOMY     Social History   Social History  . Marital status: Widowed    Spouse name: N/A  . Number of children: N/A  . Years of education: N/A   Social History Main Topics  . Smoking status: Never Smoker  . Smokeless tobacco: Never Used  . Alcohol use No  . Drug use: No  . Sexual activity: No   Other Topics Concern  . None   Social History Narrative  . None   History reviewed. No pertinent family history. Allergies  Allergen Reactions  . Penicillins Swelling    Has patient had a PCN reaction causing immediate rash, facial/tongue/throat swelling, SOB or lightheadedness with hypotension: Yes Has patient had a PCN reaction causing severe rash involving mucus membranes or skin necrosis: No Has patient had a PCN reaction that required hospitalization No Has patient had a PCN reaction occurring within the last 10 years: No If all of the above answers are "NO", then may proceed with Cephalosporin use.    Prior to Admission medications   Medication Sig Start Date End Date Taking? Authorizing Provider  aspirin EC 81 MG tablet Take 81 mg by mouth daily.   Yes Historical Provider, MD  atenolol (TENORMIN) 25 MG tablet Take 12.5 mg  by mouth daily.   Yes Historical Provider, MD  atorvastatin (LIPITOR) 20 MG tablet Take 20 mg by mouth daily.   Yes Historical Provider, MD  IRON PO Take 65 mg by mouth daily.   Yes Historical Provider, MD  LORazepam (ATIVAN) 1 MG tablet Take 1 mg by mouth 2 (two) times daily.   Yes Historical Provider, MD  memantine (NAMENDA) 10 MG tablet Take 10 mg by mouth 2 (two) times daily.   Yes Historical Provider, MD  mirtazapine (REMERON) 7.5 MG tablet Take 7.5 mg by mouth at bedtime.   Yes Historical Provider, MD  Multiple Vitamin (MULTIVITAMIN WITH MINERALS) TABS tablet Take 1 tablet by mouth daily.   Yes Historical Provider, MD  rosuvastatin (CRESTOR) 5 MG tablet Take 5 mg by mouth daily.   Yes Historical Provider, MD   Ct Head Wo Contrast  Result Date: 05/24/2016 CLINICAL DATA:  Larey Seat while walking to the bathroom. Trauma to the head and neck. EXAM: CT HEAD WITHOUT CONTRAST CT CERVICAL SPINE WITHOUT CONTRAST TECHNIQUE: Multidetector CT imaging of the head and cervical spine was performed following the standard protocol without intravenous contrast. Multiplanar CT image reconstructions of the cervical spine were also generated. COMPARISON:  None. FINDINGS: CT HEAD FINDINGS Brain: The brain shows generalized atrophy. There are chronic small-vessel ischemic changes affecting the basal ganglia and cerebral hemispheric white matter. 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 No evidence of acute  infarction. No intra-axial mass lesion, hemorrhage, hydrocephalus or extra-axial collection. Vascular: There is atherosclerotic calcification of the major vessels at the base of the brain. Skull: No skull fracture. 1 cm calcification at the inner table on the right is a benign dural calcification without mass effect. Sinuses/Orbits: Clear/ normal Other: None CT CERVICAL SPINE FINDINGS Alignment: Normal Skull base and vertebrae: Negative Soft tissues and spinal canal: No soft tissue swelling. No  significant soft tissue neck finding. Disc levels: Degenerative spondylosis and facet arthropathy without evidence of advanced disease. Facets are fused on the left at C3-4. No likely significant stenosis. Upper chest: Normal Other: None IMPRESSION: Head CT: No acute or traumatic finding. Atrophy and chronic small vessel ischemic change. Cervical spine CT: No acute or traumatic finding. Ordinary age related degenerative changes without likely neural compromise. Electronically Signed   By: Paulina Fusi M.D.   On: 05/24/2016 06:59   Ct Cervical Spine Wo Contrast  Result Date: 05/24/2016 CLINICAL DATA:  Larey Seat while walking to the bathroom. Trauma to the head and neck. EXAM: CT HEAD WITHOUT CONTRAST CT CERVICAL SPINE WITHOUT CONTRAST TECHNIQUE: Multidetector CT imaging of the head and cervical spine was performed following the standard protocol without intravenous contrast. Multiplanar CT image reconstructions of the cervical spine were also generated. COMPARISON:  None. FINDINGS: CT HEAD FINDINGS Brain: The brain shows generalized atrophy. There are chronic small-vessel ischemic changes affecting the basal ganglia and cerebral hemispheric white matter. 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 No evidence of acute infarction. No intra-axial mass lesion, hemorrhage, hydrocephalus or extra-axial collection. Vascular: There is atherosclerotic calcification of the major vessels at the base of the brain. Skull: No skull fracture. 1 cm calcification at the inner table on the right is a benign dural calcification without mass effect. Sinuses/Orbits: Clear/ normal Other: None CT CERVICAL SPINE FINDINGS Alignment: Normal Skull base and vertebrae: Negative Soft tissues and spinal canal: No soft tissue swelling. No significant soft tissue neck finding. Disc levels: Degenerative spondylosis and facet arthropathy without evidence of advanced disease. Facets are fused on the left at C3-4. No likely  significant stenosis. Upper chest: Normal Other: None IMPRESSION: Head CT: No acute or traumatic finding. Atrophy and chronic small vessel ischemic change. Cervical spine CT: No acute or traumatic finding. Ordinary age related degenerative changes without likely neural compromise. Electronically Signed   By: Paulina Fusi M.D.   On: 05/24/2016 06:59    Positive ROS: All other systems have been reviewed and were otherwise negative with the exception of those mentioned in the HPI and as above.  Physical Exam: General: not oriented to place or time Cardiovascular: No pedal edema Respiratory: No cyanosis, no use of accessory musculature GI: No organomegaly, abdomen is soft and non-tender Skin: No lesions in the area of chief complaint Neurologic: Sensation intact distally Psychiatric: Patient is competent for consent with normal mood and affect Lymphatic: No axillary or cervical lymphadenopathy  MUSCULOSKELETAL: Left leg, shortened and ER  .  Spontaneously wiggles toes, skin wwp, 2+ DP pulse  Assessment: Left hip, femoral neck fracture with displacement  Plan: -operative intervention in the morning with Dr. Linna Caprice -NPO tonight at [place] -SCDS for dvt ppx  -NWB LLE until surgery -I have discussed the surgical plan with the daughter, Cherlynn Polo [phone]. -The risks, benefits, and alternatives were discussed with the patient. There are risks associated with the surgery including, but not limited to, problems with anesthesia (death), infection, differences in leg length/angulation/rotation, fracture of bones, loosening  or failure of implants, malunion, nonunion, hematoma (blood accumulation) which may require surgical drainage, blood clots, pulmonary embolism, nerve injury (foot drop), and blood vessel injury. The patient understands these risks and elects to proceed.     Yolonda KidaJason Patrick Rogers, MD Cell [phone]    05/24/2016 12:47 PM

## 2016-05-25 NOTE — Progress Notes (Signed)
PROGRESS NOTE    Ariel Douglas  LKG:[id number] DOB: 1919-08-21 DOA: 05/23/2016  PCP: [name], MD   Brief Narrative:  Ariel Douglas is a 82 y.o. female with medical history significant of dementia, hypertension, hyperlipidemia, depression, gastritis ID, who presents with left hip pain after fall. According to daughter, patient had an unwitnessed fall in the bathroom. She is found to have a displaced left femoral neck fracture.  Subjective: No complaints   Review of systems: Negative  Assessment & Plan:   Principal Problem:   Closed left hip fracture - Left hip hemiarthroplasty today  Active Problems: Murmur -2/6 murmur at apex -No mention of this and her history - 2-D echo- EF 40-45%, grade 2 Diastolic dysfunction, mod MR, TR and pulm HTN - will need to be careful with fluids- follow I and O - will begin ARB after surgery as long as Cr remains stable - already on Atenolol  Acute Renal insufficiency vs CKD 3 - as there is no old lab work to compare with, it is difficult to tell if it's acute or chronic- cont to follow  Hypokalemia -Improved    Hypertension - Continue atenolol    Dementia - Continue Namenda    Depression with anxiety - Remeron and Ativan twice a day   DVT prophylaxis:  Per ortho Code Status: Full code Family Communication:  Disposition Plan: Likely home vs skilled nursing in 2-3 days Consultants:   Orthopedic surgery Procedures:  ECHO Study Conclusions  - Left ventricle: The cavity size was normal. Systolic function was   mildly reduced. The estimated ejection fraction was in the range   of 45% to 50%. Diffuse hypokinesis. Features are consistent with   a pseudonormal left ventricular filling pattern, with concomitant   abnormal relaxation and increased filling pressure (grade 2   diastolic dysfunction). - Aortic valve: Trileaflet; normal thickness leaflets. There was   mild regurgitation. - Aortic root: The aortic root  was normal in size. - Mitral valve: Mildly thickened leaflets . There was moderate   regurgitation. - Left atrium: The atrium was normal in size. - Right ventricle: Systolic function was normal. - Right atrium: The atrium was normal in size. - Tricuspid valve: There was moderate regurgitation. - Pulmonary arteries: Systolic pressure was moderately increased.   PA peak pressure: 47 mm Hg (S). - Inferior vena cava: The vessel was normal in size. - Pericardium, extracardiac: A trivial pericardial effusion was   identified. Features were not consistent with tamponade   physiology. Antimicrobials:  Anti-infectives    Start     Dose/Rate Route Frequency Ordered Stop   05/25/16 1015  clindamycin (CLEOCIN) IVPB 900 mg     900 mg 100 mL/hr over 30 Minutes Intravenous On call to O.R. 05/25/16 1006 05/25/16 1045       Objective: Vitals:   05/25/16 1300 05/25/16 1315 05/25/16 1325 05/25/16 1330  BP: (!) 163/87 (!) 156/79  (!) 153/79  Pulse: 88 83 82 87  Resp: 17 16 14 16   Temp: 98.2 F (36.8 C)   98.2 F (36.8 C)  TempSrc:      SpO2: 100% 100% 100% 100%  Weight:      Height:        Intake/Output Summary (Last 24 hours) at 05/25/16 1353 Last data filed at 05/25/16 1330  Gross per 24 hour  Intake             2050 ml  Output  750 ml  Net             1300 ml   Filed Weights   05/24/16 0342  Weight: 43.6 kg (96 lb 1.9 oz)    Examination: General exam: Appears comfortable  HEENT: PERRLA, oral mucosa moist, no sclera icterus or thrush Respiratory system: Clear to auscultation. Respiratory effort normal. Cardiovascular system: S1 & S2 heard, RRR.  No murmurs  Gastrointestinal system: Abdomen soft, non-tender, nondistended. Normal bowel sound. No organomegaly Central nervous system: Alert - oriented to person only. No focal neurological deficits. Extremities: No cyanosis, clubbing or edema Skin: No rashes or ulcers Psychiatry:  Mood & affect appropriate.     Data  Reviewed: I have personally reviewed following labs and imaging studies  CBC:  Recent Labs Lab 05/23/16 2336 05/24/16 0545 05/25/16 0408  WBC 10.8* 11.1* 9.2  NEUTROABS 9.6*  --   --   HGB 11.9* 11.9* 10.7*  HCT 35.8* 35.9* 32.7*  MCV 81.9 81.2 81.3  PLT 178 188 172   Basic Metabolic Panel:  Recent Labs Lab 05/23/16 2336 05/24/16 0338 05/25/16 0408  NA 140 140 137  K 3.3* 3.6 3.7  CL 107 107 109  CO2 22 24 20*  GLUCOSE 147* 157* 123*  BUN 17 18 20   CREATININE 1.26* 1.17* 1.29*  CALCIUM 9.5 9.5 9.0   GFR: Estimated Creatinine Clearance: 18 mL/min (by C-G formula based on SCr of 1.29 mg/dL (H)). Liver Function Tests: No results for input(s): AST, ALT, ALKPHOS, BILITOT, PROT, ALBUMIN in the last 168 hours. No results for input(s): LIPASE, AMYLASE in the last 168 hours. No results for input(s): AMMONIA in the last 168 hours. Coagulation Profile:  Recent Labs Lab 05/23/16 2336  INR 1.01   Cardiac Enzymes: No results for input(s): CKTOTAL, CKMB, CKMBINDEX, TROPONINI in the last 168 hours. BNP (last 3 results) No results for input(s): PROBNP in the last 8760 hours. HbA1C: No results for input(s): HGBA1C in the last 72 hours. CBG: No results for input(s): GLUCAP in the last 168 hours. Lipid Profile: No results for input(s): CHOL, HDL, LDLCALC, TRIG, CHOLHDL, LDLDIRECT in the last 72 hours. Thyroid Function Tests: No results for input(s): TSH, T4TOTAL, FREET4, T3FREE, THYROIDAB in the last 72 hours. Anemia Panel: No results for input(s): VITAMINB12, FOLATE, FERRITIN, TIBC, IRON, RETICCTPCT in the last 72 hours. Urine analysis:    Component Value Date/Time   COLORURINE YELLOW 05/24/2016 0420   APPEARANCEUR CLEAR 05/24/2016 0420   LABSPEC 1.010 05/24/2016 0420   PHURINE 8.0 05/24/2016 0420   GLUCOSEU NEGATIVE 05/24/2016 0420   HGBUR SMALL (A) 05/24/2016 0420   BILIRUBINUR NEGATIVE 05/24/2016 0420   KETONESUR NEGATIVE 05/24/2016 0420   PROTEINUR NEGATIVE  05/24/2016 0420   NITRITE NEGATIVE 05/24/2016 0420   LEUKOCYTESUR NEGATIVE 05/24/2016 0420   Sepsis Labs: @LABRCNTIP (procalcitonin:4,lacticidven:4) ) Recent Results (from the past 240 hour(s))  Surgical pcr screen     Status: None   Collection Time: 05/24/16  4:24 AM  Result Value Ref Range Status   MRSA, PCR NEGATIVE NEGATIVE Final   Staphylococcus aureus NEGATIVE NEGATIVE Final    Comment:        The Xpert SA Assay (FDA approved for NASAL specimens in patients over 78 years of age), is one component of a comprehensive surveillance program.  Test performance has been validated by [facility] for patients greater than or equal to 78 year old. It is not intended to diagnose infection nor to guide or monitor treatment.  Radiology Studies: Ct Head Wo Contrast  Result Date: 05/24/2016 CLINICAL DATA:  Larey SeatFell while walking to the bathroom. Trauma to the head and neck. EXAM: CT HEAD WITHOUT CONTRAST CT CERVICAL SPINE WITHOUT CONTRAST TECHNIQUE: Multidetector CT imaging of the head and cervical spine was performed following the standard protocol without intravenous contrast. Multiplanar CT image reconstructions of the cervical spine were also generated. COMPARISON:  None. FINDINGS: CT HEAD FINDINGS Brain: The brain shows generalized atrophy. There are chronic small-vessel ischemic changes affecting the basal ganglia and cerebral hemispheric white matter. 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 No evidence of acute infarction. No intra-axial mass lesion, hemorrhage, hydrocephalus or extra-axial collection. Vascular: There is atherosclerotic calcification of the major vessels at the base of the brain. Skull: No skull fracture. 1 cm calcification at the inner table on the right is a benign dural calcification without mass effect. Sinuses/Orbits: Clear/ normal Other: None CT CERVICAL SPINE FINDINGS Alignment: Normal Skull base and vertebrae: Negative Soft  tissues and spinal canal: No soft tissue swelling. No significant soft tissue neck finding. Disc levels: Degenerative spondylosis and facet arthropathy without evidence of advanced disease. Facets are fused on the left at C3-4. No likely significant stenosis. Upper chest: Normal Other: None IMPRESSION: Head CT: No acute or traumatic finding. Atrophy and chronic small vessel ischemic change. Cervical spine CT: No acute or traumatic finding. Ordinary age related degenerative changes without likely neural compromise. Electronically Signed   By: Paulina FusiMark  Shogry M.D.   On: 05/24/2016 06:59   Ct Cervical Spine Wo Contrast  Result Date: 05/24/2016 CLINICAL DATA:  Larey SeatFell while walking to the bathroom. Trauma to the head and neck. EXAM: CT HEAD WITHOUT CONTRAST CT CERVICAL SPINE WITHOUT CONTRAST TECHNIQUE: Multidetector CT imaging of the head and cervical spine was performed following the standard protocol without intravenous contrast. Multiplanar CT image reconstructions of the cervical spine were also generated. COMPARISON:  None. FINDINGS: CT HEAD FINDINGS Brain: The brain shows generalized atrophy. There are chronic small-vessel ischemic changes affecting the basal ganglia and cerebral hemispheric white matter. 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 No evidence of acute infarction. No intra-axial mass lesion, hemorrhage, hydrocephalus or extra-axial collection. Vascular: There is atherosclerotic calcification of the major vessels at the base of the brain. Skull: No skull fracture. 1 cm calcification at the inner table on the right is a benign dural calcification without mass effect. Sinuses/Orbits: Clear/ normal Other: None CT CERVICAL SPINE FINDINGS Alignment: Normal Skull base and vertebrae: Negative Soft tissues and spinal canal: No soft tissue swelling. No significant soft tissue neck finding. Disc levels: Degenerative spondylosis and facet arthropathy without evidence of advanced  disease. Facets are fused on the left at C3-4. No likely significant stenosis. Upper chest: Normal Other: None IMPRESSION: Head CT: No acute or traumatic finding. Atrophy and chronic small vessel ischemic change. Cervical spine CT: No acute or traumatic finding. Ordinary age related degenerative changes without likely neural compromise. Electronically Signed   By: Paulina FusiMark  Shogry M.D.   On: 05/24/2016 06:59   Pelvis Portable  Result Date: 05/25/2016 CLINICAL DATA:  Status post left hip replacement. EXAM: PORTABLE PELVIS 1-2 VIEWS COMPARISON:  None. FINDINGS: A portable AP view of the left hip and a portion of the pelvis demonstrates a left bipolar hip prosthesis in satisfactory position and alignment. No fracture or dislocation is seen. Diffuse osteopenia is noted. IMPRESSION: Satisfactory postoperative appearance of a left hip prosthesis. Electronically Signed   By: Beckie SaltsSteven  Reid  M.D.   On: 05/25/2016 13:13   Dg Chest Port 1 View  Result Date: 05/25/2016 CLINICAL DATA:  Preop for left femoral neck fracture EXAM: PORTABLE CHEST 1 VIEW COMPARISON:  Pelvic radiograph from 05/23/2016 FINDINGS: Heart is enlarged. The aorta is tortuous without aneurysm. Atherosclerosis is seen at the aortic arch. The lungs are clear. No acute osseous abnormality of the bony thorax. Multilevel degenerative disc disease of the thoracic spine consistent with spondylosis. IMPRESSION: Cardiomegaly.  Clear lungs. Electronically Signed   By: Tollie Eth M.D.   On: 05/25/2016 09:43   Dg Knee Left Port  Result Date: 05/24/2016 CLINICAL DATA:  Left knee pain. EXAM: PORTABLE LEFT KNEE - 1-2 VIEW COMPARISON:  None FINDINGS: No joint effusion. There is mild medial compartment and patellofemoral compartment osteoarthritis. Chondrocalcinosis is present. No fracture or subluxation. IMPRESSION: 1. No acute findings. 2. Mild osteoarthritis and chondrocalcinosis. . Electronically Signed   By: Signa Kell M.D.   On: 05/24/2016 13:58   Dg C-arm  61-120 Min-no Report  Result Date: 05/25/2016 CLINICAL DATA: left anterior hip hemiarthroplasty C-ARM 61-120 MINUTES Fluoroscopy was utilized by the requesting physician.  No radiographic interpretation.   Dg Hip Operative Unilat W Or W/o Pelvis Left  Result Date: 05/25/2016 CLINICAL DATA:  Left anterior hip hemiarthroplasty. EXAM: DG C-ARM 61-120 MIN-NO REPORT; OPERATIVE LEFT HIP WITH PELVIS COMPARISON:  05/23/2016 FLUOROSCOPY TIME:  Fluoroscopy Time:  12 seconds Radiation Exposure Index (if provided by the fluoroscopic device): 0.82 mGy Number of Acquired Spot Images: 0 FINDINGS: Four intraoperative views. These demonstrate placement of a left hip arthroplasty, without acute hardware complication. IMPRESSION: Intraoperative imaging of left hip arthroplasty. Electronically Signed   By: Jeronimo Greaves M.D.   On: 05/25/2016 12:39      Scheduled Meds: . acetaminophen  1,000 mg Intravenous Once  . aspirin EC  81 mg Oral Daily  . atenolol  12.5 mg Oral Daily  . atorvastatin  20 mg Oral Daily  . feeding supplement (ENSURE ENLIVE)  237 mL Oral BID BM  . ferrous sulfate  325 mg Oral Daily  . LORazepam  1 mg Oral BID  . memantine  10 mg Oral BID  . mirtazapine  7.5 mg Oral QHS  . multivitamin with minerals  1 tablet Oral Daily   Continuous Infusions:    LOS: 1 day    Time spent in minutes: 35    Yazmyne Sara, MD [facility] Pager: [email or web address] Password [email or web address] 05/25/2016, 1:53 PM

## 2016-05-25 NOTE — Op Note (Signed)
OPERATIVE REPORT  SURGEON: Samson Frederic, MD   ASSISTANT: Alphonsa Overall, PA-C.  PREOPERATIVE DIAGNOSIS: Displaced Left femoral neck fracture.   POSTOPERATIVE DIAGNOSIS: Displaced Left femoral neck fracture.   PROCEDURE: Left hip hemiarthroplasty, anterior approach.   IMPLANTS: DePuy Tri Lock stem, size 4, std offset, with a 0 mm spacer and a 44 mm monopolar head ball.  ANESTHESIA:  General  ANTIBIOTICS: 900 mg clindamycin (PCN allergy).  ESTIMATED BLOOD LOSS: 50 mL.   DRAINS: None.  COMPLICATIONS: None   CONDITION: PACU - hemodynamically stable.   BRIEF CLINICAL NOTE: Ariel Douglas is a 82 y.o. female with a displaced Left femoral neck fracture. The patient was admitted to the hospitalist service and underwent perioperative risk stratification and medical optimization. The risks, benefits, and alternatives to hemiarthroplasty were explained, and the patient elected to proceed.  PROCEDURE IN DETAIL: The patient was taken to the operating room and general anesthesia was induced on the hospital bed. The patient was then positioned on the Hana table. All bony prominences were well padded. The hip was prepped and draped in the normal sterile surgical fashion. A time-out was called verifying side and site of surgery. Antibiotics were given within 60 minutes of beginning the procedure.  The direct anterior approach to the hip was performed through the Hueter interval. Lateral femoral circumflex vessels were treated with the Auqumantys. The anterior capsule was exposed and an inverted T capsulotomy was made. Fracture hematoma was encountered and evacuated. The patient was found to have a comminuted Left subcapital femoral neck fracture. I freshened the femoral neck cut with a saw. I removed the femoral neck fragment. A corkscrew was placed into the head and the head was removed. This was passed to the back table and was measured.  Acetabular exposure was achieved. I  examined the articular cartilage which was intact. The labrum was intact. A 44 mm trial head was placed and found to have excellent fit.  I then gained femoral exposure taking care to protect the abductors and greater trochanter. This was performed using standard external rotation, extension, and adduction. The capsule was peeled off the inner aspect of the greater trochanter, taking care to preserve the short external rotators. A cookie cutter was used to enter the femoral canal, and then the femoral canal finder was used to confirm location. I then sequentially broached up to a size 4. Calcar planer was used on the femoral neck remnant. I paced a std neck and a 36+ 1.5 head ball.The hip was reduced. Leg lengths were checked fluoroscopically. The hip was dislocated and trial components were removed. I placed the real stem followed by the real spacer and head ball. A single reduction maneuver was performed and the hip was reduced. Fluoroscopy was used to confirm component position and leg lengths. At 90 degrees of external rotation and extension, the hip was stable to an anterior directed force.  The wound was copiously irrigated with normal saline solution. Marcaine solution was injected into the periarticular soft tissue. The wound was closed in layers using #1 Vicryl and V-Loc for the fascia, 2-0 Vicryl for the subcutaneous fat, 2-0 Monocryl for the deep dermal layer, 3-0 running Monocryl subcuticular stitch and glue for the skin. Once the glue was fully dried, an Aquacell Ag dressing was applied. The patient was then awakened from anesthesia and transported to the recovery room in stable condition. Sponge, needle, and instrument counts were correct at the end of the case x2. The patient tolerated the procedure well and  there were no known complications.  Please note that a surgical assistant was a medical necessity for this procedure to perform it in a safe and expeditious manner.  Assistant was necessary to provide appropriate retraction of vital neurovascular structures, to prevent femoral fracture, and to allow for anatomic placement of the prosthesis.

## 2016-05-25 NOTE — Anesthesia Procedure Notes (Signed)
Procedure Name: Intubation Date/Time: 05/25/2016 10:42 AM Performed by: Doran ClayALDAY, Kinzlee Selvy R Pre-anesthesia Checklist: Patient identified, Timeout performed, Emergency Drugs available, Suction available and Patient being monitored Patient Re-evaluated:Patient Re-evaluated prior to inductionOxygen Delivery Method: Circle system utilized Preoxygenation: Pre-oxygenation with 100% oxygen Intubation Type: IV induction Laryngoscope Size: Mac and 3 Grade View: Grade I Tube type: Oral Tube size: 7.0 mm Number of attempts: 1 Airway Equipment and Method: Stylet Placement Confirmation: ETT inserted through vocal cords under direct vision,  positive ETCO2 and breath sounds checked- equal and bilateral Secured at: 22 cm Tube secured with: Tape Dental Injury: Teeth and Oropharynx as per pre-operative assessment

## 2016-05-26 DIAGNOSIS — F418 Other specified anxiety disorders: Secondary | ICD-10-CM

## 2016-05-26 DIAGNOSIS — S72002D Fracture of unspecified part of neck of left femur, subsequent encounter for closed fracture with routine healing: Secondary | ICD-10-CM

## 2016-05-26 LAB — BASIC METABOLIC PANEL
ANION GAP: 8 (ref 5–15)
BUN: 27 mg/dL — ABNORMAL HIGH (ref 6–20)
BUN: 27 mg/dL — AB (ref 4–21)
CALCIUM: 9.1 mg/dL (ref 8.9–10.3)
CO2: 22 mmol/L (ref 22–32)
CREATININE: 1.7 mg/dL — AB (ref 0.5–1.1)
CREATININE: 1.72 mg/dL — AB (ref 0.44–1.00)
Chloride: 108 mmol/L (ref 101–111)
GFR, EST AFRICAN AMERICAN: 28 mL/min — AB (ref >60)
GFR, EST NON AFRICAN AMERICAN: 24 mL/min — AB (ref >60)
GLUCOSE: 138 mg/dL — AB (ref 65–99)
Glucose: 138 mg/dL
Potassium: 4.8 mmol/L (ref 3.4–5.3)
Potassium: 4.8 mmol/L (ref 3.5–5.1)
SODIUM: 138 mmol/L (ref 137–147)
Sodium: 138 mmol/L (ref 135–145)

## 2016-05-26 LAB — CBC
HCT: 34.1 % — ABNORMAL LOW (ref 36.0–46.0)
Hemoglobin: 10.9 g/dL — ABNORMAL LOW (ref 12.0–15.0)
MCH: 27 pg (ref 26.0–34.0)
MCHC: 32 g/dL (ref 30.0–36.0)
MCV: 84.6 fL (ref 78.0–100.0)
PLATELETS: 171 10*3/uL (ref 150–400)
RBC: 4.03 MIL/uL (ref 3.87–5.11)
RDW: 17.6 % — AB (ref 11.5–15.5)
WBC: 9.9 10*3/uL (ref 4.0–10.5)

## 2016-05-26 LAB — CBC AND DIFFERENTIAL
HEMATOCRIT: 34 % — AB (ref 36–46)
Hemoglobin: 10.9 g/dL — AB (ref 12.0–16.0)
Platelets: 171 10*3/uL (ref 150–399)
WBC: 9.9 10^3/mL

## 2016-05-26 MED ORDER — SODIUM CHLORIDE 0.9 % IV SOLN
INTRAVENOUS | Status: DC
  Administered 2016-05-26 (×2): 500 mL via INTRAVENOUS
  Administered 2016-05-28: 1000 mL via INTRAVENOUS

## 2016-05-26 NOTE — Evaluation (Signed)
Physical Therapy Evaluation Patient Details Name: Ariel PrimesLillie Douglas MRN: [id number] DOB: [birth year] Today's Date: 05/26/2016   History of Present Illness  82 yo female admitted with L hip fx. S/P L hip hemi DA 05/26/16. hx of dementia  Clinical Impression  On eval, pt required Mod assist +2 for safety for mobility. She was able to walk ~55 with a RW. Family present during session-[facility] Will follow and progress activity as tolerated.     Follow Up Recommendations SNF    Equipment Recommendations  Rolling walker with 5" wheels    Recommendations for Other Services       Precautions / Restrictions Precautions Precautions: Fall Restrictions Weight Bearing Restrictions: No LLE Weight Bearing: Weight bearing as tolerated      Mobility  Bed Mobility Overal bed mobility: Needs Assistance Bed Mobility: Supine to Sit     Supine to sit: HOB elevated;Mod assist     General bed mobility comments: Assist for trunk and bil LEs. Increased time. Utilized bedpad to aid with scooting, positioning. Multimodal cueing required  Transfers Overall transfer level: Needs assistance Equipment used: Rolling walker (2 wheeled) Transfers: Sit to/from Stand Sit to Stand: Mod assist;From elevated surface         General transfer comment: Assist to rise, stabilize, control descent. Multimodal cueing required for safety, technique, hand placement.   Ambulation/Gait Ambulation/Gait assistance: Min assist;+2 safety/equipment Ambulation Distance (Feet): 55 Feet Assistive device: Rolling walker (2 wheeled) Gait Pattern/deviations: Step-through pattern;Decreased stride length;Trunk flexed     General Gait Details: Assist to stabilize pt and maneuver safely with RW. Followed closely with recliner. Pt denied pain during ambulation. fatigues fairly easily.   Stairs            Wheelchair Mobility    Modified Rankin (Stroke Patients Only)       Balance Overall  balance assessment: Needs assistance;History of Falls         Standing balance support: Bilateral upper extremity supported Standing balance-Leahy Scale: Poor                               Pertinent Vitals/Pain Pain Assessment: No/denies pain    Home Living Family/patient expects to be discharged to:: Skilled nursing facility Living Arrangements: Children Available Help at Discharge: Family Type of Home: House       Home Layout: Two level Home Equipment: None      Prior Function Level of Independence: Independent         Comments: ambulatory without an assitive device PTA.      Hand Dominance        Extremity/Trunk Assessment   Upper Extremity Assessment: Generalized weakness           Lower Extremity Assessment: Generalized weakness      Cervical / Trunk Assessment: Kyphotic  Communication   Communication: HOH  Cognition Arousal/Alertness: Awake/alert Behavior During Therapy: WFL for tasks assessed/performed Overall Cognitive Status: History of cognitive impairments - at baseline                      General Comments      Exercises     Assessment/Plan    PT Assessment Patient needs continued PT services  PT Problem List Decreased strength;Decreased mobility;Decreased range of motion;Decreased activity tolerance;Decreased knowledge of use of DME;Decreased balance;Pain          PT Treatment Interventions DME instruction;Gait training;Therapeutic activities;Therapeutic  exercise;Balance training;Functional mobility training;Patient/family education    PT Goals (Current goals can be found in the Care Plan section)  Acute Rehab PT Goals Patient Stated Goal: rehab per family PT Goal Formulation: With family Time For Goal Achievement: 06/09/16 Potential to Achieve Goals: Good    Frequency Min 3X/week   Barriers to discharge        Co-evaluation               End of Session Equipment Utilized During  Treatment: Gait belt Activity Tolerance: Patient tolerated treatment well Patient left: in chair;with call bell/phone within reach;with chair alarm set;with family/visitor present           Time: 1517-1530 PT Time Calculation (min) (ACUTE ONLY): 13 min   Charges:   PT Evaluation $PT Eval Low Complexity: 1 Procedure     PT G Codes:        Rebeca Alert, MPT Pager: [phone]

## 2016-05-26 NOTE — Progress Notes (Signed)
Nursing Note: Per shift report,Foley to be left in till am per Rn shift report.wbb

## 2016-05-26 NOTE — Progress Notes (Signed)
PROGRESS NOTE    Vernette Moise  ZOX:[id number] DOB: 1919/08/16 DOA: 05/23/2016  PCP: [name], MD   Brief Narrative:  Ariel Douglas is a 82 y.o. female with medical history significant of dementia, hypertension, hyperlipidemia, depression, gastritis ID, who presents with left hip pain after fall. According to daughter, patient had an unwitnessed fall in the bathroom. She is found to have a displaced left femoral neck fracture.  Subjective: No complaints today. Specifically no nausea, vomiting or pain.   Review of systems: Negative  Assessment & Plan:   Principal Problem:   Closed left hip fracture - s/p Left hip hemiarthroplasty on 9/23- management per ortho  Active Problems: Murmur -2/6 murmur at apex -No mention of this in  her history - 2-D echo- EF 40-45%, grade 2 Diastolic dysfunction, mod MR, TR and pulm HTN - will need to be careful with fluids- follow I and O - will begin ARB is Cr stablizes - already on Atenolol  Acute Renal insufficiency vs CKD 3 - as there is no old lab work to compare with, it is difficult to tell if it's acute or chronic- cont to follow - Cr up from 1.29 to 1.72 today- start slow IVF and recheck tomorrow  Hypokalemia -Improved    Hypertension - Continue atenolol    Dementia - Continue Namenda    Depression with anxiety - Remeron and Ativan twice a day   DVT prophylaxis:  Aspirin Per ortho Code Status: Full code Family Communication:  Disposition Plan: Likely home vs skilled nursing in 2-3 days Consultants:   Orthopedic surgery Procedures:  ECHO Study Conclusions  - Left ventricle: The cavity size was normal. Systolic function was   mildly reduced. The estimated ejection fraction was in the range   of 45% to 50%. Diffuse hypokinesis. Features are consistent with   a pseudonormal left ventricular filling pattern, with concomitant   abnormal relaxation and increased filling pressure (grade 2   diastolic  dysfunction). - Aortic valve: Trileaflet; normal thickness leaflets. There was   mild regurgitation. - Aortic root: The aortic root was normal in size. - Mitral valve: Mildly thickened leaflets . There was moderate   regurgitation. - Left atrium: The atrium was normal in size. - Right ventricle: Systolic function was normal. - Right atrium: The atrium was normal in size. - Tricuspid valve: There was moderate regurgitation. - Pulmonary arteries: Systolic pressure was moderately increased.   PA peak pressure: 47 mm Hg (S). - Inferior vena cava: The vessel was normal in size. - Pericardium, extracardiac: A trivial pericardial effusion was   identified. Features were not consistent with tamponade   physiology. Antimicrobials:  Anti-infectives    Start     Dose/Rate Route Frequency Ordered Stop   05/25/16 2000  clindamycin (CLEOCIN) IVPB 600 mg     600 mg 100 mL/hr over 30 Minutes Intravenous Every 6 hours 05/25/16 1944 05/26/16 0234   05/25/16 1015  clindamycin (CLEOCIN) IVPB 900 mg     900 mg 100 mL/hr over 30 Minutes Intravenous On call to O.R. 05/25/16 1006 05/25/16 1045       Objective: Vitals:   05/25/16 1800 05/25/16 2156 05/26/16 0211 05/26/16 0533  BP: (!) 142/85 127/72  121/64  Pulse: 77 70 60 63  Resp:  15  15  Temp: 98.6 F (37 C) 97.7 F (36.5 C)  97.7 F (36.5 C)  TempSrc: Axillary Oral  Oral  SpO2: 100% 97% 100% 100%  Weight:      Height:  Intake/Output Summary (Last 24 hours) at 05/26/16 1224 Last data filed at 05/26/16 0537  Gross per 24 hour  Intake             1000 ml  Output              600 ml  Net              400 ml   Filed Weights   05/24/16 0342  Weight: 43.6 kg (96 lb 1.9 oz)    Examination: General exam: Appears comfortable  HEENT: PERRLA, oral mucosa moist, no sclera icterus or thrush Respiratory system: Clear to auscultation. Respiratory effort normal. Cardiovascular system: S1 & S2 heard, RRR.  No murmurs  Gastrointestinal  system: Abdomen soft, non-tender, nondistended. Normal bowel sound. No organomegaly Central nervous system: Alert - oriented to person only. No focal neurological deficits. Extremities: No cyanosis, clubbing or edema Skin: No rashes or ulcers Psychiatry:  Mood & affect appropriate.     Data Reviewed: I have personally reviewed following labs and imaging studies  CBC:  Recent Labs Lab 05/23/16 2336 05/24/16 0545 05/25/16 0408 05/26/16 0502  WBC 10.8* 11.1* 9.2 9.9  NEUTROABS 9.6*  --   --   --   HGB 11.9* 11.9* 10.7* 10.9*  HCT 35.8* 35.9* 32.7* 34.1*  MCV 81.9 81.2 81.3 84.6  PLT 178 188 172 171   Basic Metabolic Panel:  Recent Labs Lab 05/23/16 2336 05/24/16 0338 05/25/16 0408 05/26/16 0502  NA 140 140 137 138  K 3.3* 3.6 3.7 4.8  CL 107 107 109 108  CO2 22 24 20* 22  GLUCOSE 147* 157* 123* 138*  BUN 17 18 20  27*  CREATININE 1.26* 1.17* 1.29* 1.72*  CALCIUM 9.5 9.5 9.0 9.1   GFR: Estimated Creatinine Clearance: 13.5 mL/min (by C-G formula based on SCr of 1.72 mg/dL (H)). Liver Function Tests: No results for input(s): AST, ALT, ALKPHOS, BILITOT, PROT, ALBUMIN in the last 168 hours. No results for input(s): LIPASE, AMYLASE in the last 168 hours. No results for input(s): AMMONIA in the last 168 hours. Coagulation Profile:  Recent Labs Lab 05/23/16 2336  INR 1.01   Cardiac Enzymes: No results for input(s): CKTOTAL, CKMB, CKMBINDEX, TROPONINI in the last 168 hours. BNP (last 3 results) No results for input(s): PROBNP in the last 8760 hours. HbA1C: No results for input(s): HGBA1C in the last 72 hours. CBG: No results for input(s): GLUCAP in the last 168 hours. Lipid Profile: No results for input(s): CHOL, HDL, LDLCALC, TRIG, CHOLHDL, LDLDIRECT in the last 72 hours. Thyroid Function Tests: No results for input(s): TSH, T4TOTAL, FREET4, T3FREE, THYROIDAB in the last 72 hours. Anemia Panel: No results for input(s): VITAMINB12, FOLATE, FERRITIN, TIBC, IRON,  RETICCTPCT in the last 72 hours. Urine analysis:    Component Value Date/Time   COLORURINE YELLOW 05/24/2016 0420   APPEARANCEUR CLEAR 05/24/2016 0420   LABSPEC 1.010 05/24/2016 0420   PHURINE 8.0 05/24/2016 0420   GLUCOSEU NEGATIVE 05/24/2016 0420   HGBUR SMALL (A) 05/24/2016 0420   BILIRUBINUR NEGATIVE 05/24/2016 0420   KETONESUR NEGATIVE 05/24/2016 0420   PROTEINUR NEGATIVE 05/24/2016 0420   NITRITE NEGATIVE 05/24/2016 0420   LEUKOCYTESUR NEGATIVE 05/24/2016 0420   Sepsis Labs: @LABRCNTIP (procalcitonin:4,lacticidven:4) ) Recent Results (from the past 240 hour(s))  Surgical pcr screen     Status: None   Collection Time: 05/24/16  4:24 AM  Result Value Ref Range Status   MRSA, PCR NEGATIVE NEGATIVE Final   Staphylococcus aureus NEGATIVE NEGATIVE Final  Comment:        The Xpert SA Assay (FDA approved for NASAL specimens in patients over 51 years of age), is one component of a comprehensive surveillance program.  Test performance has been validated by [facility] for patients greater than or equal to 22 year old. It is not intended to diagnose infection nor to guide or monitor treatment.          Radiology Studies: Pelvis Portable  Result Date: 05/25/2016 CLINICAL DATA:  Status post left hip replacement. EXAM: PORTABLE PELVIS 1-2 VIEWS COMPARISON:  None. FINDINGS: A portable AP view of the left hip and a portion of the pelvis demonstrates a left bipolar hip prosthesis in satisfactory position and alignment. No fracture or dislocation is seen. Diffuse osteopenia is noted. IMPRESSION: Satisfactory postoperative appearance of a left hip prosthesis. Electronically Signed   By: Beckie Salts M.D.   On: 05/25/2016 13:13   Dg Chest Port 1 View  Result Date: 05/25/2016 CLINICAL DATA:  Preop for left femoral neck fracture EXAM: PORTABLE CHEST 1 VIEW COMPARISON:  Pelvic radiograph from 05/23/2016 FINDINGS: Heart is enlarged. The aorta is tortuous without aneurysm.  Atherosclerosis is seen at the aortic arch. The lungs are clear. No acute osseous abnormality of the bony thorax. Multilevel degenerative disc disease of the thoracic spine consistent with spondylosis. IMPRESSION: Cardiomegaly.  Clear lungs. Electronically Signed   By: Tollie Eth M.D.   On: 05/25/2016 09:43   Dg Knee Left Port  Result Date: 05/24/2016 CLINICAL DATA:  Left knee pain. EXAM: PORTABLE LEFT KNEE - 1-2 VIEW COMPARISON:  None FINDINGS: No joint effusion. There is mild medial compartment and patellofemoral compartment osteoarthritis. Chondrocalcinosis is present. No fracture or subluxation. IMPRESSION: 1. No acute findings. 2. Mild osteoarthritis and chondrocalcinosis. . Electronically Signed   By: Signa Kell M.D.   On: 05/24/2016 13:58   Dg C-arm 61-120 Min-no Report  Result Date: 05/25/2016 CLINICAL DATA: left anterior hip hemiarthroplasty C-ARM 61-120 MINUTES Fluoroscopy was utilized by the requesting physician.  No radiographic interpretation.   Dg Hip Operative Unilat W Or W/o Pelvis Left  Result Date: 05/25/2016 CLINICAL DATA:  Left anterior hip hemiarthroplasty. EXAM: DG C-ARM 61-120 MIN-NO REPORT; OPERATIVE LEFT HIP WITH PELVIS COMPARISON:  05/23/2016 FLUOROSCOPY TIME:  Fluoroscopy Time:  12 seconds Radiation Exposure Index (if provided by the fluoroscopic device): 0.82 mGy Number of Acquired Spot Images: 0 FINDINGS: Four intraoperative views. These demonstrate placement of a left hip arthroplasty, without acute hardware complication. IMPRESSION: Intraoperative imaging of left hip arthroplasty. Electronically Signed   By: Jeronimo Greaves M.D.   On: 05/25/2016 12:39      Scheduled Meds: . aspirin EC  81 mg Oral BID WC  . atenolol  12.5 mg Oral Daily  . atorvastatin  20 mg Oral Daily  . feeding supplement (ENSURE ENLIVE)  237 mL Oral BID BM  . ferrous sulfate  325 mg Oral Daily  . LORazepam  1 mg Oral BID  . memantine  10 mg Oral BID  . mirtazapine  7.5 mg Oral QHS  .  multivitamin with minerals  1 tablet Oral Daily   Continuous Infusions:    LOS: 2 days    Time spent in minutes: 35    Jaiyla Granados, MD [facility] Pager: [email or web address] Password [facility] 05/26/2016, 12:24 PM

## 2016-05-26 NOTE — Progress Notes (Signed)
Subjective: 1 Day Post-Op Procedure(s) (LRB): ANTERIOR APPROACH HEMI HIP ARTHROPLASTY (Left) Patient reports pain as 2 on 0-10 scale. No  specific problems today.   Objective: Vital signs in last 24 hours: Temp:  [97.5 F (36.4 C)-98.6 F (37 C)] 97.7 F (36.5 C) (09/24 0533) Pulse Rate:  [60-90] 63 (09/24 0533) Resp:  [13-18] 15 (09/24 0533) BP: (108-163)/(64-87) 121/64 (09/24 0533) SpO2:  [97 %-100 %] 100 % (09/24 0533)  Intake/Output from previous day: 09/23 0701 - 09/24 0700 In: 2000 [I.V.:2000] Out: 825 [Urine:775; Blood:50] Intake/Output this shift: No intake/output data recorded.   Recent Labs  05/23/16 2336 05/24/16 0545 05/25/16 0408 05/26/16 0502  HGB 11.9* 11.9* 10.7* 10.9*    Recent Labs  05/25/16 0408 05/26/16 0502  WBC 9.2 9.9  RBC 4.02 4.03  HCT 32.7* 34.1*  PLT 172 171    Recent Labs  05/25/16 0408 05/26/16 0502  NA 137 138  K 3.7 4.8  CL 109 108  CO2 20* 22  BUN 20 27*  CREATININE 1.29* 1.72*  GLUCOSE 123* 138*  CALCIUM 9.0 9.1    Recent Labs  05/23/16 2336  INR 1.01    Dorsiflexion/Plantar flexion intact  Assessment/Plan: 1 Day Post-Op Procedure(s) (LRB): ANTERIOR APPROACH HEMI HIP ARTHROPLASTY (Left) Up with therapy  Anurag Scarfo A 05/26/2016, 9:07 AM

## 2016-05-27 ENCOUNTER — Encounter (HOSPITAL_COMMUNITY): Payer: Self-pay | Admitting: Orthopedic Surgery

## 2016-05-27 LAB — BASIC METABOLIC PANEL
Anion gap: 7 (ref 5–15)
BUN: 39 mg/dL — AB (ref 4–21)
BUN: 39 mg/dL — AB (ref 6–20)
CALCIUM: 9 mg/dL (ref 8.9–10.3)
CHLORIDE: 109 mmol/L (ref 101–111)
CO2: 23 mmol/L (ref 22–32)
CREATININE: 1.76 mg/dL — AB (ref 0.44–1.00)
Creatinine: 1.8 mg/dL — AB (ref 0.5–1.1)
GFR calc non Af Amer: 23 mL/min — ABNORMAL LOW (ref >60)
GFR, EST AFRICAN AMERICAN: 27 mL/min — AB (ref >60)
Glucose, Bld: 97 mg/dL (ref 65–99)
Glucose: 97 mg/dL
POTASSIUM: 5.1 mmol/L (ref 3.4–5.3)
Potassium: 5.1 mmol/L (ref 3.5–5.1)
SODIUM: 139 mmol/L (ref 137–147)
SODIUM: 139 mmol/L (ref 135–145)

## 2016-05-27 LAB — CBC
HEMATOCRIT: 34.9 % — AB (ref 36.0–46.0)
HEMOGLOBIN: 11.3 g/dL — AB (ref 12.0–15.0)
MCH: 27.3 pg (ref 26.0–34.0)
MCHC: 32.4 g/dL (ref 30.0–36.0)
MCV: 84.3 fL (ref 78.0–100.0)
Platelets: 196 10*3/uL (ref 150–400)
RBC: 4.14 MIL/uL (ref 3.87–5.11)
RDW: 17.4 % — AB (ref 11.5–15.5)
WBC: 10.3 10*3/uL (ref 4.0–10.5)

## 2016-05-27 LAB — CBC AND DIFFERENTIAL
HEMATOCRIT: 35 % — AB (ref 36–46)
HEMOGLOBIN: 11.3 g/dL — AB (ref 12.0–16.0)
Platelets: 196 10*3/uL (ref 150–399)
WBC: 10.3 10^3/mL

## 2016-05-27 MED ORDER — SENNOSIDES-DOCUSATE SODIUM 8.6-50 MG PO TABS
1.0000 | ORAL_TABLET | Freq: Every day | ORAL | Status: DC
  Administered 2016-05-27: 1 via ORAL
  Filled 2016-05-27: qty 1

## 2016-05-27 MED ORDER — CARVEDILOL 3.125 MG PO TABS
3.1250 mg | ORAL_TABLET | Freq: Two times a day (BID) | ORAL | Status: DC
  Administered 2016-05-27 – 2016-05-28 (×3): 3.125 mg via ORAL
  Filled 2016-05-27 (×3): qty 1

## 2016-05-27 MED ORDER — CARVEDILOL 3.125 MG PO TABS: 3.1250 mg | ORAL_TABLET | Freq: Two times a day (BID) | ORAL | Status: DC

## 2016-05-27 MED ORDER — POLYETHYLENE GLYCOL 3350 17 G PO PACK
17.0000 g | PACK | Freq: Once | ORAL | Status: AC
  Administered 2016-05-27: 17 g via ORAL
  Filled 2016-05-27: qty 1

## 2016-05-27 MED ORDER — SODIUM CHLORIDE 0.9 % IV BOLUS (SEPSIS): 500.0000 mL | INTRAVENOUS | Status: DC | PRN

## 2016-05-27 NOTE — Progress Notes (Signed)
PROGRESS NOTE    Ariel Douglas  ZOX:[id number] DOB: June 15, 1919 DOA: 05/23/2016  PCP: [name], MD   Brief Narrative:  Ariel Douglas is a 82 y.o. female with medical history significant of dementia, hypertension, hyperlipidemia, depression, gastritis ID, who presents with left hip pain after fall. According to daughter, patient had an unwitnessed fall in the bathroom. She is found to have a displaced left femoral neck fracture.  Subjective: No complaints today.   Review of systems: Negative  Assessment & Plan:   Principal Problem:   Closed left hip fracture - Left hip hemiarthroplasty  9/23- management per ortho - will need SNF per PT eval  Active Problems: Murmur- systolic and diastolic CHF -2/6 murmur at apex -No mention of this and her history - 2-D echo- EF 40-45%, grade 2 Diastolic dysfunction, mod MR, TR and pulm HTN - will need to be careful with fluids- follow I and O - can consider starting ARB  Once Cr is determined to be stable- otherwise, will need Hydralazine/ Nitrate combination - already on Atenolol- due to renal failure, will switch to Coreg  Acute Renal insufficiency vs CKD 3 - as there is no old lab work to compare with, it is difficult to tell if it's acute or chronic - BUN/Cr has slightly worsened during her hospital stay- she only had 500 cc Urine in 24hr period (yesterday) - will increase fluids today- follow for hypoxia due to CHF- check weight- cont to follow I and O carefully  Hypokalemia -Improved  Hypertension - Continue atenolol  Dementia - Continue Namenda  Depression with anxiety - Remeron and Ativan twice a day   DVT prophylaxis:  Per ortho Asa 81 mg BID Code Status: Full code Family Communication:  Disposition Plan: Likely skilled nursing in 1-2 days Consultants:   Orthopedic surgery Procedures:  ECHO Study Conclusions  - Left ventricle: The cavity size was normal. Systolic function was   mildly reduced. The  estimated ejection fraction was in the range   of 45% to 50%. Diffuse hypokinesis. Features are consistent with   a pseudonormal left ventricular filling pattern, with concomitant   abnormal relaxation and increased filling pressure (grade 2   diastolic dysfunction). - Aortic valve: Trileaflet; normal thickness leaflets. There was   mild regurgitation. - Aortic root: The aortic root was normal in size. - Mitral valve: Mildly thickened leaflets . There was moderate   regurgitation. - Left atrium: The atrium was normal in size. - Right ventricle: Systolic function was normal. - Right atrium: The atrium was normal in size. - Tricuspid valve: There was moderate regurgitation. - Pulmonary arteries: Systolic pressure was moderately increased.   PA peak pressure: 47 mm Hg (S). - Inferior vena cava: The vessel was normal in size. - Pericardium, extracardiac: A trivial pericardial effusion was   identified. Features were not consistent with tamponade   physiology. Antimicrobials:  Anti-infectives    Start     Dose/Rate Route Frequency Ordered Stop   05/25/16 2000  clindamycin (CLEOCIN) IVPB 600 mg     600 mg 100 mL/hr over 30 Minutes Intravenous Every 6 hours 05/25/16 1944 05/26/16 0234   05/25/16 1015  clindamycin (CLEOCIN) IVPB 900 mg     900 mg 100 mL/hr over 30 Minutes Intravenous On call to O.R. 05/25/16 1006 05/25/16 1045       Objective: Vitals:   05/26/16 1500 05/26/16 1639 05/26/16 2108 05/27/16 0521  BP: (!) 150/77 (!) 150/0 (!) 126/59 130/66  Pulse: 70 70 74 78  Resp: 16  16 16   Temp: 97.7 F (36.5 C)  97.3 F (36.3 C) 98.2 F (36.8 C)  TempSrc: Axillary  Oral Oral  SpO2: 100%  100% 100%  Weight:      Height:        Intake/Output Summary (Last 24 hours) at 05/27/16 1031 Last data filed at 05/27/16 1610  Gross per 24 hour  Intake              880 ml  Output              500 ml  Net              380 ml   Filed Weights   05/24/16 0342  Weight: 43.6 kg (96 lb  1.9 oz)    Examination: General exam: Appears comfortable  HEENT: PERRLA, oral mucosa moist, no sclera icterus or thrush Respiratory system: Clear to auscultation. Respiratory effort normal. Cardiovascular system: S1 & S2 heard, RRR.  No murmurs  Gastrointestinal system: Abdomen soft, non-tender, nondistended. Normal bowel sound. No organomegaly Central nervous system: Alert - oriented to person only. No focal neurological deficits. Extremities: No cyanosis, clubbing or edema Skin: No rashes or ulcers Psychiatry:  Mood & affect appropriate.     Data Reviewed: I have personally reviewed following labs and imaging studies  CBC:  Recent Labs Lab 05/23/16 2336 05/24/16 0545 05/25/16 0408 05/26/16 0502 05/27/16 0349  WBC 10.8* 11.1* 9.2 9.9 10.3  NEUTROABS 9.6*  --   --   --   --   HGB 11.9* 11.9* 10.7* 10.9* 11.3*  HCT 35.8* 35.9* 32.7* 34.1* 34.9*  MCV 81.9 81.2 81.3 84.6 84.3  PLT 178 188 172 171 196   Basic Metabolic Panel:  Recent Labs Lab 05/23/16 2336 05/24/16 0338 05/25/16 0408 05/26/16 0502 05/27/16 0349  NA 140 140 137 138 139  K 3.3* 3.6 3.7 4.8 5.1  CL 107 107 109 108 109  CO2 22 24 20* 22 23  GLUCOSE 147* 157* 123* 138* 97  BUN 17 18 20  27* 39*  CREATININE 1.26* 1.17* 1.29* 1.72* 1.76*  CALCIUM 9.5 9.5 9.0 9.1 9.0   GFR: Estimated Creatinine Clearance: 13.2 mL/min (by C-G formula based on SCr of 1.76 mg/dL (H)). Liver Function Tests: No results for input(s): AST, ALT, ALKPHOS, BILITOT, PROT, ALBUMIN in the last 168 hours. No results for input(s): LIPASE, AMYLASE in the last 168 hours. No results for input(s): AMMONIA in the last 168 hours. Coagulation Profile:  Recent Labs Lab 05/23/16 2336  INR 1.01   Cardiac Enzymes: No results for input(s): CKTOTAL, CKMB, CKMBINDEX, TROPONINI in the last 168 hours. BNP (last 3 results) No results for input(s): PROBNP in the last 8760 hours. HbA1C: No results for input(s): HGBA1C in the last 72  hours. CBG: No results for input(s): GLUCAP in the last 168 hours. Lipid Profile: No results for input(s): CHOL, HDL, LDLCALC, TRIG, CHOLHDL, LDLDIRECT in the last 72 hours. Thyroid Function Tests: No results for input(s): TSH, T4TOTAL, FREET4, T3FREE, THYROIDAB in the last 72 hours. Anemia Panel: No results for input(s): VITAMINB12, FOLATE, FERRITIN, TIBC, IRON, RETICCTPCT in the last 72 hours. Urine analysis:    Component Value Date/Time   COLORURINE YELLOW 05/24/2016 0420   APPEARANCEUR CLEAR 05/24/2016 0420   LABSPEC 1.010 05/24/2016 0420   PHURINE 8.0 05/24/2016 0420   GLUCOSEU NEGATIVE 05/24/2016 0420   HGBUR SMALL (A) 05/24/2016 0420   BILIRUBINUR NEGATIVE 05/24/2016 0420   KETONESUR NEGATIVE 05/24/2016 0420  PROTEINUR NEGATIVE 05/24/2016 0420   NITRITE NEGATIVE 05/24/2016 0420   LEUKOCYTESUR NEGATIVE 05/24/2016 0420   Sepsis Labs: @LABRCNTIP (procalcitonin:4,lacticidven:4) ) Recent Results (from the past 240 hour(s))  Surgical pcr screen     Status: None   Collection Time: 05/24/16  4:24 AM  Result Value Ref Range Status   MRSA, PCR NEGATIVE NEGATIVE Final   Staphylococcus aureus NEGATIVE NEGATIVE Final    Comment:        The Xpert SA Assay (FDA approved for NASAL specimens in patients over 80 years of age), is one component of a comprehensive surveillance program.  Test performance has been validated by [facility] for patients greater than or equal to 80 year old. It is not intended to diagnose infection nor to guide or monitor treatment.          Radiology Studies: Pelvis Portable  Result Date: 05/25/2016 CLINICAL DATA:  Status post left hip replacement. EXAM: PORTABLE PELVIS 1-2 VIEWS COMPARISON:  None. FINDINGS: A portable AP view of the left hip and a portion of the pelvis demonstrates a left bipolar hip prosthesis in satisfactory position and alignment. No fracture or dislocation is seen. Diffuse osteopenia is noted. IMPRESSION: Satisfactory  postoperative appearance of a left hip prosthesis. Electronically Signed   By: Beckie SaltsSteven  Reid M.D.   On: 05/25/2016 13:13   Dg C-arm 61-120 Min-no Report  Result Date: 05/25/2016 CLINICAL DATA: left anterior hip hemiarthroplasty C-ARM 61-120 MINUTES Fluoroscopy was utilized by the requesting physician.  No radiographic interpretation.   Dg Hip Operative Unilat W Or W/o Pelvis Left  Result Date: 05/25/2016 CLINICAL DATA:  Left anterior hip hemiarthroplasty. EXAM: DG C-ARM 61-120 MIN-NO REPORT; OPERATIVE LEFT HIP WITH PELVIS COMPARISON:  05/23/2016 FLUOROSCOPY TIME:  Fluoroscopy Time:  12 seconds Radiation Exposure Index (if provided by the fluoroscopic device): 0.82 mGy Number of Acquired Spot Images: 0 FINDINGS: Four intraoperative views. These demonstrate placement of a left hip arthroplasty, without acute hardware complication. IMPRESSION: Intraoperative imaging of left hip arthroplasty. Electronically Signed   By: Jeronimo GreavesKyle  Talbot M.D.   On: 05/25/2016 12:39      Scheduled Meds: . aspirin EC  81 mg Oral BID WC  . atenolol  12.5 mg Oral Daily  . atorvastatin  20 mg Oral Daily  . feeding supplement (ENSURE ENLIVE)  237 mL Oral BID BM  . ferrous sulfate  325 mg Oral Daily  . LORazepam  1 mg Oral BID  . memantine  10 mg Oral BID  . mirtazapine  7.5 mg Oral QHS  . multivitamin with minerals  1 tablet Oral Daily   Continuous Infusions: . sodium chloride 500 mL (05/26/16 2056)     LOS: 3 days    Time spent in minutes: 35    Davie Claud, MD [facility] Pager: [email or web address] Password [email or web address] 05/27/2016, 10:31 AM

## 2016-05-27 NOTE — Progress Notes (Signed)
Pt sitting up in chair. Appetite good, feeds self.

## 2016-05-27 NOTE — Progress Notes (Signed)
   Subjective:  Patient reports pain as mild to moderate.  No c/o. States hip feels better.  Objective:   VITALS:   Vitals:   05/26/16 1500 05/26/16 1639 05/26/16 2108 05/27/16 0521  BP: (!) 150/77 (!) 150/0 (!) 126/59 130/66  Pulse: 70 70 74 78  Resp: 16  16 16   Temp: 97.7 F (36.5 C)  97.3 F (36.3 C) 98.2 F (36.8 C)  TempSrc: Axillary  Oral Oral  SpO2: 100%  100% 100%  Weight:      Height:        NAD Sensation intact distally Intact pulses distally Dorsiflexion/Plantar flexion intact Incision: dressing C/D/I Compartment soft   Lab Results  Component Value Date   WBC 10.3 05/27/2016   HGB 11.3 (L) 05/27/2016   HCT 34.9 (L) 05/27/2016   MCV 84.3 05/27/2016   PLT 196 05/27/2016   BMET    Component Value Date/Time   NA 139 05/27/2016 0349   K 5.1 05/27/2016 0349   CL 109 05/27/2016 0349   CO2 23 05/27/2016 0349   GLUCOSE 97 05/27/2016 0349   BUN 39 (H) 05/27/2016 0349   CREATININE 1.76 (H) 05/27/2016 0349   CALCIUM 9.0 05/27/2016 0349   GFRNONAA 23 (L) 05/27/2016 0349   GFRAA 27 (L) 05/27/2016 0349     Assessment/Plan: 2 Days Post-Op   Principal Problem:   Closed left hip fracture (HCC) Active Problems:   Hypertension   Dementia   Depression with anxiety   HLD (hyperlipidemia)   Fall   Hypokalemia   AKI (acute kidney injury) (HCC)   Protein-calorie malnutrition, severe   Left displaced femoral neck fracture (HCC)    WBAT with walker DVT ppx: ASA 81 mg PO BID x6 weeks, SCDs, TEDs PT/OT PO pain control Dispo: d/c planning, please discuss d/c plan with Chong SicilianJill Lauer, RN [facility] manager [facility]    Enrigue Hashimi, Cloyde ReamsBrian James 05/27/2016, 7:27 AM   Samson FredericBrian Yordy Matton, MD Cell [phone]

## 2016-05-27 NOTE — Clinical Social Work Note (Signed)
Clinical Social Work Assessment  Patient Details  Name: Ariel Douglas MRN: [id number] Date of Birth: 1918-11-14  Date of referral:  05/27/16               Reason for consult:  Facility Placement                Permission sought to share information with:  Oceanographeracility Contact Representative Permission granted to share information::  Yes, Verbal Permission Granted  Name::        Agency::     Relationship::     Contact Information:     Housing/Transportation Living arrangements for the past 2 months:  Single Family Home Source of Information:  Adult Children Patient Interpreter Needed:  None Criminal Activity/Legal Involvement Pertinent to Current Situation/Hospitalization:  No - Comment as needed Significant Relationships:  Adult Children Lives with:  Adult Children Do you feel safe going back to the place where you live?  No Need for family participation in patient care:  Yes (Comment)  Care giving concerns:  CSW received consult for SNF placement.    Social Worker assessment / plan:  CSW spoke with patient's daughter, [name] (cell#: [phone]) re: discharge planning. Patient's daughter is agreeable with plan for SNF placement.   Employment status:  Retired Database administratornsurance information:  Managed Medicare PT Recommendations:  Skilled Nursing Facility Information / Referral to community resources:  Skilled Nursing Facility  Patient/Family's Response to care:  Patient's daughter informed CSW that patient had been to [facility] in the past and would prefer that she go there. CSW confirmed with [name] at [facility] that they would be able to offer a bed.   Patient/Family's Understanding of and Emotional Response to Diagnosis, Current Treatment, and Prognosis:    Emotional Assessment Appearance:  Appears stated age Attitude/Demeanor/Rapport:    Affect (typically observed):    Orientation:  Oriented to Self Alcohol / Substance use:    Psych involvement (Current and /or in  the community):     Discharge Needs  Concerns to be addressed:    Readmission within the last 30 days:    Current discharge risk:    Barriers to Discharge:      [name], Mrytle Bento F, LCSW 05/27/2016, 12:47 PM

## 2016-05-27 NOTE — Clinical Social Work Placement (Signed)
Patient has a bed at [facility]. CSW has completed FL2 & will continue to follow and assist with discharge when ready.    Lincoln MaxinKelly Khilynn Borntreger, LCSW [facility] Clinical Social Worker cell #: [phone]     CLINICAL SOCIAL WORK PLACEMENT  NOTE  Date:  05/27/2016  Patient Details  Name: Ariel Douglas MRN: [id number] Date of Birth: Jul 26, 1919  Clinical Social Work is seeking post-discharge placement for this patient at the Skilled  Nursing Facility level of care (*CSW will initial, date and re-position this form in  chart as items are completed):  Yes   Patient/family provided with [place] Clinical Social Work Department's list of facilities offering this level of care within the geographic area requested by the patient (or if unable, by the patient's family).  Yes   Patient/family informed of their freedom to choose among providers that offer the needed level of care, that participate in Medicare, Medicaid or managed care program needed by the patient, have an available bed and are willing to accept the patient.  Yes   Patient/family informed of [facility]'s ownership interest in [facility] and [facility], as well as of the fact that they are under no obligation to receive care at these facilities.  PASRR submitted to EDS on       PASRR number received on       Existing PASRR number confirmed on 05/27/16     FL2 transmitted to all facilities in geographic area requested by pt/family on 05/27/16     FL2 transmitted to all facilities within larger geographic area on       Patient informed that his/her managed care company has contracts with or will negotiate with certain facilities, including the following:        Yes   Patient/family informed of bed offers received.  Patient chooses bed at [facility]     Physician recommends and patient chooses bed at      Patient to be transferred to [facility] on   .  Patient to be transferred to facility by       Patient family notified on   of transfer.  Name of family member notified:        PHYSICIAN       Additional Comment:    _______________________________________________ [name], Conner Muegge F, LCSW 05/27/2016, 12:56 PM

## 2016-05-27 NOTE — Progress Notes (Signed)
OT Cancellation Note  Patient Details Name: Ariel Douglas MRN: [id number] DOB: 02-Jan-1919   Cancelled Treatment:    Reason Eval/Treat Not Completed: Other (comment)  Noted plan for SNF. Will defer OT eval to SNF  Delvina Mizzell, Metro KungLorraine D 05/27/2016, 2:15 PM

## 2016-05-27 NOTE — Progress Notes (Signed)
Physical Therapy Treatment Patient Details Name: Lauralyn PrimesLillie Smith-Roberts MRN: [id number] DOB: [birth year] Today's Date: 05/27/2016    History of Present Illness 82 yo female admitted with L hip fx. S/P L hip hemi DA 05/26/16. hx of dementia    PT Comments    Progressing with mobility.   Follow Up Recommendations  SNF     Equipment Recommendations  Rolling walker with 5" wheels    Recommendations for Other Services       Precautions / Restrictions Precautions Precautions: Fall Restrictions Weight Bearing Restrictions: No LLE Weight Bearing: Weight bearing as tolerated    Mobility  Bed Mobility               General bed mobility comments: oob in recliner  Transfers Overall transfer level: Needs assistance Equipment used: Rolling walker (2 wheeled) Transfers: Sit to/from Stand Sit to Stand: Mod assist         General transfer comment: Assist to rise, stabilize, control descent. Multimodal cueing required for safety, technique, hand placement.   Ambulation/Gait Ambulation/Gait assistance: Min assist Ambulation Distance (Feet): 75 Feet Assistive device: Rolling walker (2 wheeled) Gait Pattern/deviations: Step-to pattern;Trunk flexed     General Gait Details: Assist to stabilize pt and maneuver safely with RW.   Stairs            Wheelchair Mobility    Modified Rankin (Stroke Patients Only)       Balance                                    Cognition Arousal/Alertness: Awake/alert Behavior During Therapy: WFL for tasks assessed/performed Overall Cognitive Status: History of cognitive impairments - at baseline                      Exercises General Exercises - Lower Extremity Ankle Circles/Pumps: AROM;Both;10 reps;Supine Heel Slides: AAROM;10 reps;Supine;Left Hip ABduction/ADduction: Left;AAROM;10 reps;Supine    General Comments        Pertinent Vitals/Pain Pain Assessment: Faces Faces Pain Scale: Hurts little  more Pain Location: L groin area Pain Descriptors / Indicators: Sore Pain Intervention(s): Monitored during session;Repositioned    Home Living                      Prior Function            PT Goals (current goals can now be found in the care plan section) Progress towards PT goals: Progressing toward goals    Frequency    Min 3X/week      PT Plan Current plan remains appropriate    Co-evaluation             End of Session Equipment Utilized During Treatment: Gait belt Activity Tolerance: Patient tolerated treatment well Patient left: in chair;with call bell/phone within reach;with chair alarm set     Time: 1410-1425 PT Time Calculation (min) (ACUTE ONLY): 15 min  Charges:  $Gait Training: 8-22 mins                    G Codes:      Rebeca AlertJannie Jeraldin Fesler, MPT Pager: [phone]

## 2016-05-27 NOTE — Progress Notes (Signed)
Nutrition Follow-up  DOCUMENTATION CODES:   Severe malnutrition in context of chronic illness, Underweight  INTERVENTION:   -Continue Ensure Enlive po BID, each supplement provides 350 kcal and 20 grams of protein' -Provide Magic cup BID with meals, each supplement provides 290 kcal and 9 grams of protein -Encourage PO intake -RD to continue to monitor  NUTRITION DIAGNOSIS:   Inadequate oral intake related to inability to eat, poor appetite as evidenced by meal completion < 25%, per patient/family report.  Ongoing.  GOAL:   Patient will meet greater than or equal to 90% of their needs  Progressing.  MONITOR:   PO intake, Supplement acceptance, Weight trends, I & O's  ASSESSMENT:   82 y.o. female with medical history significant of dementia, hypertension, hyperlipidemia, depression, gastritis ID, who presents with left hip pain after fall. According to daughter, patient had an unwitnessed fall in the bathroom. 9/23: s/p ANTERIOR APPROACH HEMI HIP ARTHROPLASTY (Left)  Patient in room with lunch tray at bedside. Pt reports eating ~1/2 of her chicken salad sandwich. RD helped patient open a container of pudding and pt proceeded to eat some. Pt states her appetite is good. She prefers chocolate flavored Ensure, RD will add to order. Pt is willing to try Magic cups as well, RD to order.  PO intake has ranged from 30-50%.  Labs reviewed. Medications: Ferrous Sulfate tablet daily, Remeron tablet daily, Multivitamin with minerals daily, Miralax packet once   Diet Order:  Diet Heart Room service appropriate? Yes; Fluid consistency: Thin  Skin:  Wound (see comment) (9/23 hip incision)  Last BM:  9/21  Height:   Ht Readings from Last 1 Encounters:  05/24/16 5\' 5"  (1.651 m)    Weight:   Wt Readings from Last 1 Encounters:  05/24/16 96 lb 1.9 oz (43.6 kg)    Ideal Body Weight:  56.82 kg  BMI:  Body mass index is 16 kg/m.  Estimated Nutritional Needs:   Kcal:   1100-1300  Protein:  50-60 grams  Fluid:  1.1-1.3 L/day  EDUCATION NEEDS:   Education needs no appropriate at this time  Tilda FrancoLindsey Jennavie Martinek, MS, RD, LDN Pager: [phone] After Hours Pager: [phone]

## 2016-05-28 DIAGNOSIS — G894 Chronic pain syndrome: Secondary | ICD-10-CM | POA: Diagnosis not present

## 2016-05-28 DIAGNOSIS — R600 Localized edema: Secondary | ICD-10-CM | POA: Diagnosis not present

## 2016-05-28 DIAGNOSIS — I110 Hypertensive heart disease with heart failure: Secondary | ICD-10-CM | POA: Diagnosis not present

## 2016-05-28 DIAGNOSIS — S72002A Fracture of unspecified part of neck of left femur, initial encounter for closed fracture: Secondary | ICD-10-CM | POA: Diagnosis not present

## 2016-05-28 DIAGNOSIS — I129 Hypertensive chronic kidney disease with stage 1 through stage 4 chronic kidney disease, or unspecified chronic kidney disease: Secondary | ICD-10-CM | POA: Diagnosis not present

## 2016-05-28 DIAGNOSIS — Z96642 Presence of left artificial hip joint: Secondary | ICD-10-CM | POA: Diagnosis not present

## 2016-05-28 DIAGNOSIS — W19XXXD Unspecified fall, subsequent encounter: Secondary | ICD-10-CM | POA: Diagnosis not present

## 2016-05-28 DIAGNOSIS — M6281 Muscle weakness (generalized): Secondary | ICD-10-CM | POA: Diagnosis not present

## 2016-05-28 DIAGNOSIS — S72002D Fracture of unspecified part of neck of left femur, subsequent encounter for closed fracture with routine healing: Secondary | ICD-10-CM | POA: Diagnosis not present

## 2016-05-28 DIAGNOSIS — R011 Cardiac murmur, unspecified: Secondary | ICD-10-CM | POA: Diagnosis not present

## 2016-05-28 DIAGNOSIS — D62 Acute posthemorrhagic anemia: Secondary | ICD-10-CM | POA: Diagnosis not present

## 2016-05-28 DIAGNOSIS — I2510 Atherosclerotic heart disease of native coronary artery without angina pectoris: Secondary | ICD-10-CM | POA: Diagnosis not present

## 2016-05-28 DIAGNOSIS — I5042 Chronic combined systolic (congestive) and diastolic (congestive) heart failure: Secondary | ICD-10-CM | POA: Diagnosis not present

## 2016-05-28 DIAGNOSIS — S728X2D Other fracture of left femur, subsequent encounter for closed fracture with routine healing: Secondary | ICD-10-CM | POA: Diagnosis not present

## 2016-05-28 DIAGNOSIS — N179 Acute kidney failure, unspecified: Secondary | ICD-10-CM | POA: Diagnosis not present

## 2016-05-28 DIAGNOSIS — Z9181 History of falling: Secondary | ICD-10-CM | POA: Diagnosis not present

## 2016-05-28 DIAGNOSIS — S72002S Fracture of unspecified part of neck of left femur, sequela: Secondary | ICD-10-CM | POA: Diagnosis not present

## 2016-05-28 DIAGNOSIS — I10 Essential (primary) hypertension: Secondary | ICD-10-CM | POA: Diagnosis not present

## 2016-05-28 DIAGNOSIS — N183 Chronic kidney disease, stage 3 (moderate): Secondary | ICD-10-CM | POA: Diagnosis not present

## 2016-05-28 DIAGNOSIS — F0390 Unspecified dementia without behavioral disturbance: Secondary | ICD-10-CM | POA: Diagnosis not present

## 2016-05-28 DIAGNOSIS — G301 Alzheimer's disease with late onset: Secondary | ICD-10-CM | POA: Diagnosis not present

## 2016-05-28 DIAGNOSIS — R262 Difficulty in walking, not elsewhere classified: Secondary | ICD-10-CM | POA: Diagnosis not present

## 2016-05-28 LAB — BASIC METABOLIC PANEL
ANION GAP: 5 (ref 5–15)
BUN: 29 mg/dL — ABNORMAL HIGH (ref 6–20)
BUN: 29 mg/dL — AB (ref 4–21)
CHLORIDE: 109 mmol/L (ref 101–111)
CO2: 22 mmol/L (ref 22–32)
CREATININE: 1.2 mg/dL — AB (ref 0.5–1.1)
Calcium: 8.3 mg/dL — ABNORMAL LOW (ref 8.9–10.3)
Creatinine, Ser: 1.24 mg/dL — ABNORMAL HIGH (ref 0.44–1.00)
GFR calc non Af Amer: 36 mL/min — ABNORMAL LOW (ref >60)
GFR, EST AFRICAN AMERICAN: 41 mL/min — AB (ref >60)
GLUCOSE: 111 mg/dL
Glucose, Bld: 111 mg/dL — ABNORMAL HIGH (ref 65–99)
Potassium: 4.4 mmol/L (ref 3.5–5.1)
Sodium: 136 mmol/L (ref 135–145)
Sodium: 136 mmol/L — AB (ref 137–147)

## 2016-05-28 LAB — CBC
HEMATOCRIT: 29.7 % — AB (ref 36.0–46.0)
HEMOGLOBIN: 9.6 g/dL — AB (ref 12.0–15.0)
MCH: 26.5 pg (ref 26.0–34.0)
MCHC: 32.3 g/dL (ref 30.0–36.0)
MCV: 82 fL (ref 78.0–100.0)
Platelets: 192 10*3/uL (ref 150–400)
RBC: 3.62 MIL/uL — ABNORMAL LOW (ref 3.87–5.11)
RDW: 17 % — ABNORMAL HIGH (ref 11.5–15.5)
WBC: 7.4 10*3/uL (ref 4.0–10.5)

## 2016-05-28 LAB — CBC AND DIFFERENTIAL: WBC: 7.4 10^3/mL

## 2016-05-28 MED ORDER — CARVEDILOL 3.125 MG PO TABS: 3.1250 mg | ORAL_TABLET | Freq: Two times a day (BID) | ORAL | Status: DC

## 2016-05-28 MED ORDER — BISACODYL 5 MG PO TBEC: 5.0000 mg | DELAYED_RELEASE_TABLET | Freq: Every day | ORAL | 0 refills | Status: AC | PRN

## 2016-05-28 MED ORDER — ASPIRIN 81 MG PO TBEC: 81.0000 mg | DELAYED_RELEASE_TABLET | Freq: Two times a day (BID) | ORAL | 1 refills | Status: DC

## 2016-05-28 MED ORDER — HYDROCODONE-ACETAMINOPHEN 5-325 MG PO TABS: 1.0000 | ORAL_TABLET | Freq: Four times a day (QID) | ORAL | 0 refills | Status: DC | PRN

## 2016-05-28 MED ORDER — ENSURE ENLIVE PO LIQD: 237.0000 mL | Freq: Two times a day (BID) | ORAL | 12 refills | Status: DC

## 2016-05-28 MED ORDER — SENNOSIDES-DOCUSATE SODIUM 8.6-50 MG PO TABS: 1.0000 | ORAL_TABLET | Freq: Every day | ORAL | Status: DC

## 2016-05-28 MED ORDER — LORAZEPAM 1 MG PO TABS: 1.0000 mg | ORAL_TABLET | Freq: Two times a day (BID) | ORAL | 0 refills | Status: DC | PRN

## 2016-05-28 NOTE — Discharge Summary (Addendum)
Physician Discharge Summary  Ariel Douglas ZOX:[id number] DOB: [birth year] DOA: 05/23/2016  PCP: [name], MD  Admit date: 05/23/2016 Discharge date: 05/28/2016  Admitted From: home  Disposition:  SNF   Recommendations for Outpatient Follow-up:  1. For newly discovered CHF, can consider starting ARB  once Cr is determined to be stable- otherwise, will need Hydralazine/ Nitrate combination 2. Will be transferring to SNF 3. F/u with Dr Linna Caprice, orthopedic surgery in 3-4 wks   Discharge Condition:  stable   CODE STATUS:  Full code   Diet recommendation:  Low sodium, heart healthy Consultations:  Orthopedic surgery    Discharge Diagnoses:  Principal Problem:   Left displaced femoral neck fracture (HCC) Active Problems:   Fall   Hypokalemia   AKI (acute kidney injury) (HCC)   Chronic combined systolic and diastolic CHF (congestive heart failure) (HCC)   Hypertension   Dementia   Depression with anxiety   HLD (hyperlipidemia)   Protein-calorie malnutrition, severe    Subjective: No complaints.   Brief Summary: Ariel Smith-Robertsis a 82 y.o.femalewith medical history significant of dementia, hypertension, hyperlipidemia, depression, gastritis ID, who presents with left hip pain after fall. According to daughter, patient had an unwitnessed fall in the bathroom. She is found to have a displaced left femoral neck fracture.  Hospital Course:  Principal Problem:   Closed left hip fracture - Left hip hemiarthroplasty  9/23- management per ortho- DVT prophylaxis is 81 mg BID x 6 wks per ortho- she was previously on 81 mg daily - will need SNF per PT eval  Active Problems: Murmur- chronic systolic and diastolic CHF -2/6 murmur at apex -No mention of this in her history - 2-D echo obtained during hospital stay- discovered to have EF 40-45%, grade 2 Diastolic dysfunction, mod MR, TR and pulm HTN - can consider starting ARB  Once Cr is determined to be stable-  otherwise, will need Hydralazine/ Nitrate combination - already on Atenolol at home-  due to renal failure, will switch to Coreg  Acute Renal insufficiency vs CKD 3 - as there is no old lab work to compare with, it is difficult to tell if it's acute or chronic - BUN/Cr has slightly worsened during her hospital stay due to dehydration- (she only had 500 cc Urine in 24hr period) and had a noted 2 gm drop in Hb - Cr improved withIVF- respiratory status remains stable with IV hydration - suspect she has CKD stage 3- would like a repeat BMet in 1 wk to follow CR  Hypokalemia -Improved  Hypertension - Continue atenolol  Dementia - Continue Namenda  Depression with anxiety - Remeron and Ativan twice a day  Anemia  - due to acute blood loss - Hb dropped from 11.9 to 9.6- no transfusion needed- f/u CBC in 1 months   Discharge Instructions  Discharge Instructions    Diet - low sodium heart healthy    Complete by:  As directed    Increase activity slowly    Complete by:  As directed        Medication List    STOP taking these medications   atenolol 25 MG tablet Commonly known as:  TENORMIN   rosuvastatin 5 MG tablet Commonly known as:  CRESTOR     TAKE these medications   aspirin 81 MG EC tablet Take 1 tablet (81 mg total) by mouth 2 (two) times daily with a meal. What changed:  when to take this   atorvastatin 20 MG tablet Commonly known as:  LIPITOR Take 20 mg by mouth daily.   bisacodyl 5 MG EC tablet Commonly known as:  DULCOLAX Take 1 tablet (5 mg total) by mouth daily as needed for moderate constipation.   carvedilol 3.125 MG tablet Commonly known as:  COREG Take 1 tablet (3.125 mg total) by mouth 2 (two) times daily with a meal.   feeding supplement (ENSURE ENLIVE) Liqd Take 237 mLs by mouth 2 (two) times daily between meals.   HYDROcodone-acetaminophen 5-325 MG tablet Commonly known as:  NORCO/VICODIN Take 1-2 tablets by mouth every 6 (six) hours  as needed for moderate pain.   IRON PO Take 65 mg by mouth daily.   LORazepam 1 MG tablet Commonly known as:  ATIVAN Take 1 tablet (1 mg total) by mouth 2 (two) times daily as needed for anxiety. What changed:  when to take this  reasons to take this   memantine 10 MG tablet Commonly known as:  NAMENDA Take 10 mg by mouth 2 (two) times daily.   mirtazapine 7.5 MG tablet Commonly known as:  REMERON Take 7.5 mg by mouth at bedtime.   multivitamin with minerals Tabs tablet Take 1 tablet by mouth daily.   senna-docusate 8.6-50 MG tablet Commonly known as:  Senokot-S Take 1 tablet by mouth at bedtime.       Allergies  Allergen Reactions  . Penicillins Swelling    Has patient had a PCN reaction causing immediate rash, facial/tongue/throat swelling, SOB or lightheadedness with hypotension: Yes Has patient had a PCN reaction causing severe rash involving mucus membranes or skin necrosis: No Has patient had a PCN reaction that required hospitalization No Has patient had a PCN reaction occurring within the last 10 years: No If all of the above answers are "NO", then may proceed with Cephalosporin use.      Procedures/Studies: 9/23- Left hip hemiarthroplasty, anterior approach  Ct Head Wo Contrast  Result Date: 05/24/2016 CLINICAL DATA:  Larey Seat while walking to the bathroom. Trauma to the head and neck. EXAM: CT HEAD WITHOUT CONTRAST CT CERVICAL SPINE WITHOUT CONTRAST TECHNIQUE: Multidetector CT imaging of the head and cervical spine was performed following the standard protocol without intravenous contrast. Multiplanar CT image reconstructions of the cervical spine were also generated. COMPARISON:  None. FINDINGS: CT HEAD FINDINGS Brain: The brain shows generalized atrophy. There are chronic small-vessel ischemic changes affecting the basal ganglia and cerebral hemispheric white matter. 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 No evidence of acute  infarction. No intra-axial mass lesion, hemorrhage, hydrocephalus or extra-axial collection. Vascular: There is atherosclerotic calcification of the major vessels at the base of the brain. Skull: No skull fracture. 1 cm calcification at the inner table on the right is a benign dural calcification without mass effect. Sinuses/Orbits: Clear/ normal Other: None CT CERVICAL SPINE FINDINGS Alignment: Normal Skull base and vertebrae: Negative Soft tissues and spinal canal: No soft tissue swelling. No significant soft tissue neck finding. Disc levels: Degenerative spondylosis and facet arthropathy without evidence of advanced disease. Facets are fused on the left at C3-4. No likely significant stenosis. Upper chest: Normal Other: None IMPRESSION: Head CT: No acute or traumatic finding. Atrophy and chronic small vessel ischemic change. Cervical spine CT: No acute or traumatic finding. Ordinary age related degenerative changes without likely neural compromise. Electronically Signed   By: Paulina Fusi M.D.   On: 05/24/2016 06:59   Ct Cervical Spine Wo Contrast  Result Date: 05/24/2016 CLINICAL DATA:  Larey Seat while walking to the bathroom.  Trauma to the head and neck. EXAM: CT HEAD WITHOUT CONTRAST CT CERVICAL SPINE WITHOUT CONTRAST TECHNIQUE: Multidetector CT imaging of the head and cervical spine was performed following the standard protocol without intravenous contrast. Multiplanar CT image reconstructions of the cervical spine were also generated. COMPARISON:  None. FINDINGS: CT HEAD FINDINGS Brain: The brain shows generalized atrophy. There are chronic small-vessel ischemic changes affecting the basal ganglia and cerebral hemispheric white matter. 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 No evidence of acute infarction. No intra-axial mass lesion, hemorrhage, hydrocephalus or extra-axial collection. Vascular: There is atherosclerotic calcification of the major vessels at the base of the brain.  Skull: No skull fracture. 1 cm calcification at the inner table on the right is a benign dural calcification without mass effect. Sinuses/Orbits: Clear/ normal Other: None CT CERVICAL SPINE FINDINGS Alignment: Normal Skull base and vertebrae: Negative Soft tissues and spinal canal: No soft tissue swelling. No significant soft tissue neck finding. Disc levels: Degenerative spondylosis and facet arthropathy without evidence of advanced disease. Facets are fused on the left at C3-4. No likely significant stenosis. Upper chest: Normal Other: None IMPRESSION: Head CT: No acute or traumatic finding. Atrophy and chronic small vessel ischemic change. Cervical spine CT: No acute or traumatic finding. Ordinary age related degenerative changes without likely neural compromise. Electronically Signed   By: Paulina Fusi M.D.   On: 05/24/2016 06:59   Pelvis Portable  Result Date: 05/25/2016 CLINICAL DATA:  Status post left hip replacement. EXAM: PORTABLE PELVIS 1-2 VIEWS COMPARISON:  None. FINDINGS: A portable AP view of the left hip and a portion of the pelvis demonstrates a left bipolar hip prosthesis in satisfactory position and alignment. No fracture or dislocation is seen. Diffuse osteopenia is noted. IMPRESSION: Satisfactory postoperative appearance of a left hip prosthesis. Electronically Signed   By: Beckie Salts M.D.   On: 05/25/2016 13:13   Dg Chest Port 1 View  Result Date: 05/25/2016 CLINICAL DATA:  Preop for left femoral neck fracture EXAM: PORTABLE CHEST 1 VIEW COMPARISON:  Pelvic radiograph from 05/23/2016 FINDINGS: Heart is enlarged. The aorta is tortuous without aneurysm. Atherosclerosis is seen at the aortic arch. The lungs are clear. No acute osseous abnormality of the bony thorax. Multilevel degenerative disc disease of the thoracic spine consistent with spondylosis. IMPRESSION: Cardiomegaly.  Clear lungs. Electronically Signed   By: Tollie Eth M.D.   On: 05/25/2016 09:43   Dg Knee Left  Port  Result Date: 05/24/2016 CLINICAL DATA:  Left knee pain. EXAM: PORTABLE LEFT KNEE - 1-2 VIEW COMPARISON:  None FINDINGS: No joint effusion. There is mild medial compartment and patellofemoral compartment osteoarthritis. Chondrocalcinosis is present. No fracture or subluxation. IMPRESSION: 1. No acute findings. 2. Mild osteoarthritis and chondrocalcinosis. . Electronically Signed   By: Signa Kell M.D.   On: 05/24/2016 13:58   Dg C-arm 61-120 Min-no Report  Result Date: 05/25/2016 CLINICAL DATA: left anterior hip hemiarthroplasty C-ARM 61-120 MINUTES Fluoroscopy was utilized by the requesting physician.  No radiographic interpretation.   Dg Hip Operative Unilat W Or W/o Pelvis Left  Result Date: 05/25/2016 CLINICAL DATA:  Left anterior hip hemiarthroplasty. EXAM: DG C-ARM 61-120 MIN-NO REPORT; OPERATIVE LEFT HIP WITH PELVIS COMPARISON:  05/23/2016 FLUOROSCOPY TIME:  Fluoroscopy Time:  12 seconds Radiation Exposure Index (if provided by the fluoroscopic device): 0.82 mGy Number of Acquired Spot Images: 0 FINDINGS: Four intraoperative views. These demonstrate placement of a left hip arthroplasty, without acute hardware complication. IMPRESSION: Intraoperative imaging of left  hip arthroplasty. Electronically Signed   By: Jeronimo Greaves M.D.   On: 05/25/2016 12:39       Discharge Exam: Vitals:   05/28/16 0650 05/28/16 0826  BP: 130/78 120/64  Pulse: 76 87  Resp: 16   Temp: 98.2 F (36.8 C)    Vitals:   05/27/16 2155 05/28/16 0500 05/28/16 0650 05/28/16 0826  BP: 129/71  130/78 120/64  Pulse: 82  76 87  Resp: 16  16   Temp: 99.6 F (37.6 C)  98.2 F (36.8 C)   TempSrc: Oral  Oral   SpO2: 95%  95%   Weight:  48.8 kg (107 lb 9.4 oz)    Height:        General: Pt is alert, awake, not in acute distress Cardiovascular: RRR, S1/S2 +, no rubs, no gallops Respiratory: CTA bilaterally, no wheezing, no rhonchi Abdominal: Soft, NT, ND, bowel sounds + Extremities: no edema, no  cyanosis    The results of significant diagnostics from this hospitalization (including imaging, microbiology, ancillary and laboratory) are listed below for reference.     Microbiology: Recent Results (from the past 240 hour(s))  Surgical pcr screen     Status: None   Collection Time: 05/24/16  4:24 AM  Result Value Ref Range Status   MRSA, PCR NEGATIVE NEGATIVE Final   Staphylococcus aureus NEGATIVE NEGATIVE Final    Comment:        The Xpert SA Assay (FDA approved for NASAL specimens in patients over 10 years of age), is one component of a comprehensive surveillance program.  Test performance has been validated by [facility] for patients greater than or equal to 60 year old. It is not intended to diagnose infection nor to guide or monitor treatment.      Labs: BNP (last 3 results) No results for input(s): BNP in the last 8760 hours. Basic Metabolic Panel:  Recent Labs Lab 05/24/16 0338 05/25/16 0408 05/26/16 0502 05/27/16 0349 05/28/16 0420  NA 140 137 138 139 136  K 3.6 3.7 4.8 5.1 4.4  CL 107 109 108 109 109  CO2 24 20* 22 23 22   GLUCOSE 157* 123* 138* 97 111*  BUN 18 20 27* 39* 29*  CREATININE 1.17* 1.29* 1.72* 1.76* 1.24*  CALCIUM 9.5 9.0 9.1 9.0 8.3*   Liver Function Tests: No results for input(s): AST, ALT, ALKPHOS, BILITOT, PROT, ALBUMIN in the last 168 hours. No results for input(s): LIPASE, AMYLASE in the last 168 hours. No results for input(s): AMMONIA in the last 168 hours. CBC:  Recent Labs Lab 05/23/16 2336 05/24/16 0545 05/25/16 0408 05/26/16 0502 05/27/16 0349 05/28/16 0420  WBC 10.8* 11.1* 9.2 9.9 10.3 7.4  NEUTROABS 9.6*  --   --   --   --   --   HGB 11.9* 11.9* 10.7* 10.9* 11.3* 9.6*  HCT 35.8* 35.9* 32.7* 34.1* 34.9* 29.7*  MCV 81.9 81.2 81.3 84.6 84.3 82.0  PLT 178 188 172 171 196 192   Cardiac Enzymes: No results for input(s): CKTOTAL, CKMB, CKMBINDEX, TROPONINI in the last 168 hours. BNP: Invalid input(s):  POCBNP CBG: No results for input(s): GLUCAP in the last 168 hours. D-Dimer No results for input(s): DDIMER in the last 72 hours. Hgb A1c No results for input(s): HGBA1C in the last 72 hours. Lipid Profile No results for input(s): CHOL, HDL, LDLCALC, TRIG, CHOLHDL, LDLDIRECT in the last 72 hours. Thyroid function studies No results for input(s): TSH, T4TOTAL, T3FREE, THYROIDAB in the last 72 hours.  Invalid input(s): FREET3 Anemia work up No results for input(s): VITAMINB12, FOLATE, FERRITIN, TIBC, IRON, RETICCTPCT in the last 72 hours. Urinalysis    Component Value Date/Time   COLORURINE YELLOW 05/24/2016 0420   APPEARANCEUR CLEAR 05/24/2016 0420   LABSPEC 1.010 05/24/2016 0420   PHURINE 8.0 05/24/2016 0420   GLUCOSEU NEGATIVE 05/24/2016 0420   HGBUR SMALL (A) 05/24/2016 0420   BILIRUBINUR NEGATIVE 05/24/2016 0420   KETONESUR NEGATIVE 05/24/2016 0420   PROTEINUR NEGATIVE 05/24/2016 0420   NITRITE NEGATIVE 05/24/2016 0420   LEUKOCYTESUR NEGATIVE 05/24/2016 0420   Sepsis Labs Invalid input(s): PROCALCITONIN,  WBC,  LACTICIDVEN Microbiology Recent Results (from the past 240 hour(s))  Surgical pcr screen     Status: None   Collection Time: 05/24/16  4:24 AM  Result Value Ref Range Status   MRSA, PCR NEGATIVE NEGATIVE Final   Staphylococcus aureus NEGATIVE NEGATIVE Final    Comment:        The Xpert SA Assay (FDA approved for NASAL specimens in patients over 80 years of age), is one component of a comprehensive surveillance program.  Test performance has been validated by [facility] for patients greater than or equal to 80 year old. It is not intended to diagnose infection nor to guide or monitor treatment.      Time coordinating discharge: Over 30 minutes  SIGNED:   Calvert CantorIZWAN,Hiromi Knodel, MD  [facility] 05/28/2016, 10:12 AM Pager   If 7PM-7AM, please contact night-coverage [email or web address] Password [email or web address]

## 2016-05-28 NOTE — Anesthesia Postprocedure Evaluation (Signed)
Anesthesia Post Note  Patient: Ariel ClarkLillie Douglas  Procedure(s) Performed: Procedure(s) (LRB): ANTERIOR APPROACH HEMI HIP ARTHROPLASTY (Left)  Patient location during evaluation: PACU Anesthesia Type: General Level of consciousness: awake Pain management: pain level controlled Vital Signs Assessment: post-procedure vital signs reviewed and stable Respiratory status: spontaneous breathing Cardiovascular status: stable Anesthetic complications: no    Last Vitals:  Vitals:   05/28/16 0650 05/28/16 0826  BP: 130/78 120/64  Pulse: 76 87  Resp: 16   Temp: 36.8 C     Last Pain:  Vitals:   05/28/16 0650  TempSrc: Oral  PainSc:                  EDWARDS,Mick Tanguma

## 2016-05-28 NOTE — Progress Notes (Signed)
   Subjective:  Patient reports pain as mild to moderate.  No c/o.   Objective:   VITALS:   Vitals:   05/27/16 1405 05/27/16 2155 05/28/16 0500 05/28/16 0650  BP: (!) 160/85 129/71  130/78  Pulse: 85 82  76  Resp: 16 16  16   Temp: 97.6 F (36.4 C) 99.6 F (37.6 C)  98.2 F (36.8 C)  TempSrc: Axillary Oral  Oral  SpO2: 94% 95%  95%  Weight:   48.8 kg (107 lb 9.4 oz)   Height:        NAD Sensation intact distally Intact pulses distally Dorsiflexion/Plantar flexion intact Incision: no drainage Compartment soft   Lab Results  Component Value Date   WBC 7.4 05/28/2016   HGB 9.6 (L) 05/28/2016   HCT 29.7 (L) 05/28/2016   MCV 82.0 05/28/2016   PLT 192 05/28/2016   BMET    Component Value Date/Time   NA 139 05/27/2016 0349   K 5.1 05/27/2016 0349   CL 109 05/27/2016 0349   CO2 23 05/27/2016 0349   GLUCOSE 97 05/27/2016 0349   BUN 39 (H) 05/27/2016 0349   CREATININE 1.76 (H) 05/27/2016 0349   CALCIUM 9.0 05/27/2016 0349   GFRNONAA 23 (L) 05/27/2016 0349   GFRAA 27 (L) 05/27/2016 0349     Assessment/Plan: 3 Days Post-Op   Principal Problem:   Closed left hip fracture (HCC) Active Problems:   Hypertension   Dementia   Depression with anxiety   HLD (hyperlipidemia)   Fall   Hypokalemia   AKI (acute kidney injury) (HCC)   Protein-calorie malnutrition, severe   Left displaced femoral neck fracture (HCC)    WBAT with walker DVT ppx: ASA 81 mg PO BID x6 weeks, SCDs, TEDs PT/OT PO pain control Dispo: SNF placement, please place Aquacel Ag dressing prior to d/c    Garnet KoyanagiSwinteck, Hermes Wafer James 05/28/2016, 7:04 AM   Samson FredericBrian Nastassja Witkop, MD Cell [phone]

## 2016-05-28 NOTE — Progress Notes (Signed)
Report called to Midwifelu RN at [facility]. All questions answered. Patient transported via [facility].

## 2016-05-28 NOTE — Progress Notes (Signed)
Pt for d/c to [facility] today via ambulance. Pt's dtr, [name], aware of d/c and reports agreeable. DC summary and necessary paperwork sen to [facility]. Paper Rx to transport with pt to SNF. SW signing off at d/c.   Dellie BurnsJosie Ogechi Kuehnel, MSW, LCSW

## 2016-05-29 ENCOUNTER — Other Ambulatory Visit: Payer: Self-pay | Admitting: *Deleted

## 2016-05-29 ENCOUNTER — Encounter: Payer: Self-pay | Admitting: Internal Medicine

## 2016-05-29 ENCOUNTER — Non-Acute Institutional Stay (SKILLED_NURSING_FACILITY): Payer: Medicare Other | Admitting: Internal Medicine

## 2016-05-29 DIAGNOSIS — R011 Cardiac murmur, unspecified: Secondary | ICD-10-CM

## 2016-05-29 DIAGNOSIS — I110 Hypertensive heart disease with heart failure: Secondary | ICD-10-CM

## 2016-05-29 DIAGNOSIS — S72002A Fracture of unspecified part of neck of left femur, initial encounter for closed fracture: Secondary | ICD-10-CM | POA: Diagnosis not present

## 2016-05-29 DIAGNOSIS — G301 Alzheimer's disease with late onset: Secondary | ICD-10-CM

## 2016-05-29 DIAGNOSIS — I5042 Chronic combined systolic (congestive) and diastolic (congestive) heart failure: Secondary | ICD-10-CM | POA: Diagnosis not present

## 2016-05-29 DIAGNOSIS — N183 Chronic kidney disease, stage 3 (moderate): Secondary | ICD-10-CM

## 2016-05-29 DIAGNOSIS — N179 Acute kidney failure, unspecified: Secondary | ICD-10-CM

## 2016-05-29 DIAGNOSIS — F0280 Dementia in other diseases classified elsewhere without behavioral disturbance: Secondary | ICD-10-CM

## 2016-05-29 DIAGNOSIS — W19XXXD Unspecified fall, subsequent encounter: Secondary | ICD-10-CM

## 2016-05-29 DIAGNOSIS — D62 Acute posthemorrhagic anemia: Secondary | ICD-10-CM

## 2016-05-29 DIAGNOSIS — E782 Mixed hyperlipidemia: Secondary | ICD-10-CM

## 2016-05-29 MED ORDER — HYDROCODONE-ACETAMINOPHEN 5-325 MG PO TABS: ORAL_TABLET | ORAL | 0 refills | Status: DC

## 2016-05-29 NOTE — Progress Notes (Signed)
Provider:  Randon Goldsmith. [name], MD Location:  [facility] Room Number: 107P Place of Service:  SNF [phone])  PCP: [name], MD Patient Care Team: [name], MD as PCP - General (Internal Medicine) [name], MD (Internal Medicine)  Extended Emergency Contact Information Primary Emergency Contact: OLIVER,CYNTHIA Home Phone: [phone] Relation: None Secondary Emergency Contact: Hilario Quarry [place] Home Phone: [phone] Mobile Phone: [phone] Relation: Daughter   Chief Complaint  Patient presents with  . New Admit To SNF    Admit to Facility    HPI: Patient is a 82 y.o. female seen today for admission to  Past Medical History:  Diagnosis Date  . AKI (acute kidney injury) (HCC)   . Closed left hip fracture (HCC)   . Coronary artery disease   . Dementia   . Depression with anxiety   . Fall   . HLD (hyperlipidemia)   . Hyperlipidemia   . Hypertension   . Hypokalemia   . Transient ischemic attack (TIA)    Past Surgical History:  Procedure Laterality Date  . ABDOMINAL HYSTERECTOMY    . ANTERIOR APPROACH HEMI HIP ARTHROPLASTY Left 05/25/2016   Procedure: ANTERIOR APPROACH HEMI HIP ARTHROPLASTY;  Surgeon: Samson Frederic, MD;  Location: [facility];  Service: Orthopedics;  Laterality: Left;    reports that she has never smoked. She has never used smokeless tobacco. She reports that she does not drink alcohol or use drugs. Social History   Social History  . Marital status: Widowed    Spouse name: N/A  . Number of children: N/A  . Years of education: N/A   Occupational History  . Not on file.   Social History Main Topics  . Smoking status: Never Smoker  . Smokeless tobacco: Never Used  . Alcohol use No  . Drug use: No  . Sexual activity: No   Other Topics Concern  . Not on file   Social History Narrative   ** Merged History Encounter **        Family History  Problem Relation Age of Onset  . Asthma  Other   . Heart disease Other     Health Maintenance  Topic Date Due  . TETANUS/TDAP  12/13/1939  . ZOSTAVAX  12/12/1980  . DEXA SCAN  12/12/1985  . PNA vac Low Risk Adult (1 of 2 - PCV13) 12/12/1985  . INFLUENZA VACCINE  04/02/2016    Allergies  Allergen Reactions  . Penicillins Swelling    Facial and hand swelling per daughter Has patient had a PCN reaction causing immediate rash, facial/tongue/throat swelling, SOB or lightheadedness with hypotension: Yes Has patient had a PCN reaction causing severe rash involving mucus membranes or skin necrosis: No Has patient had a PCN reaction that required hospitalization No Has patient had a PCN reaction occurring within the last 10 years: No If all of the above answers are "NO", then may proceed with Cephalosporin use.  Marland Kitchen Penicillins Swelling    Has patient had a PCN reaction causing immediate rash, facial/tongue/throat swelling, SOB or lightheadedness with hypotension: Yes Has patient had a PCN reaction causing severe rash involving mucus membranes or skin necrosis: No Has patient had a PCN reaction that required hospitalization No Has patient had a PCN reaction occurring within the last 10 years: No If all of the above answers are "NO", then may proceed with Cephalosporin use.       Medication List       Accurate as of  05/29/16  9:50 AM. Always use your most recent med list.          aspirin 81 MG EC tablet Take 1 tablet (81 mg total) by mouth 2 (two) times daily with a meal.   atorvastatin 20 MG tablet Commonly known as:  LIPITOR Take 1 tablet (20 mg total) by mouth daily at 6 PM.   bisacodyl 5 MG EC tablet Commonly known as:  DULCOLAX Take 1 tablet (5 mg total) by mouth daily as needed for moderate constipation.   carvedilol 3.125 MG tablet Commonly known as:  COREG Take 1 tablet (3.125 mg total) by mouth 2 (two) times daily with a meal.   feeding supplement (ENSURE ENLIVE) Liqd Take 237 mLs by mouth 2 (two) times  daily between meals.   HYDROcodone-acetaminophen 5-325 MG tablet Commonly known as:  NORCO/VICODIN Take 1-2 tablets by mouth every 6 (six) hours as needed for moderate pain.   IRON PO Take 65 mg by mouth daily.   LORazepam 1 MG tablet Commonly known as:  ATIVAN Take 1 tablet (1 mg total) by mouth 2 (two) times daily as needed for anxiety.   memantine 10 MG tablet Commonly known as:  NAMENDA Take 10 mg by mouth 2 (two) times daily.   mirtazapine 7.5 MG tablet Commonly known as:  REMERON Take 7.5 mg by mouth at bedtime.   senna-docusate 8.6-50 MG tablet Commonly known as:  Senokot-S Take 1 tablet by mouth at bedtime.       Review of Systems  DATA OBTAINED: from patient, nurse, medical record, family member GENERAL:  no fevers, fatigue, appetite changes SKIN: No itching, or rash EYES: No eye pain, redness, discharge EARS: No earache, tinnitus, change in hearing NOSE: No congestion, drainage or bleeding  MOUTH/THROAT: No mouth or tooth pain, No sore throat RESPIRATORY: No cough, wheezing, SOB CARDIAC: No chest pain, palpitations, lower extremity edema  GI: No abdominal pain, No N/V/D or constipation, No heartburn or reflux  GU: No dysuria, frequency or urgency, or incontinence  MUSCULOSKELETAL: No unrelieved bone/joint pain NEUROLOGIC: No headache, dizziness or focal weakness PSYCHIATRIC: No c/o anxiety or sadness   Vitals:   05/29/16 0850  BP: (!) 148/66  Pulse: 89  Resp: 18  Temp: 98.2 F (36.8 C)  SpO2: 92%  Weight: 107 lb (48.5 kg)  Height: 5\' 5"  (1.651 m)   Body mass index is 17.81 kg/m.  Physical Exam  GENERAL APPEARANCE: Alert, conversant,  No acute distress.  SKIN: No diaphoresis rash HEAD: Normocephalic, atraumatic  EYES: Conjunctiva/lids clear. Pupils round, reactive. EOMs intact.  EARS: External exam WNL, canals clear. Hearing grossly normal.  NOSE: No deformity or discharge.  MOUTH/THROAT: Lips w/o lesions  RESPIRATORY: Breathing is even,  unlabored. Lung sounds are clear   CARDIOVASCULAR: Heart RRR no murmurs, rubs or gallops. No peripheral edema.   GASTROINTESTINAL: Abdomen is soft, non-tender, not distended w/ normal bowel sounds. GENITOURINARY: Bladder non tender, not distended  MUSCULOSKELETAL: No abnormal joints or musculature NEUROLOGIC:  Cranial nerves 2-12 grossly intact. Moves all extremities  PSYCHIATRIC: Mood and affect appropriate to situation, no behavioral issues  Labs reviewed: Basic Metabolic Panel:  Recent Labs  16/10/96 0502 05/27/16 05/27/16 0349 05/28/16 05/28/16 0420  NA 138 139 139 136* 136  K 4.8 5.1 5.1  --  4.4  CL 108  --  109  --  109  CO2 22  --  23  --  22  GLUCOSE 138*  --  97  --  111*  BUN 27* 39* 39* 29* 29*  CREATININE 1.72* 1.8* 1.76* 1.2* 1.24*  CALCIUM 9.1  --  9.0  --  8.3*   Liver Function Tests: No results for input(s): AST, ALT, ALKPHOS, BILITOT, PROT, ALBUMIN in the last 8760 hours. No results for input(s): LIPASE, AMYLASE in the last 8760 hours. No results for input(s): AMMONIA in the last 8760 hours. CBC:  Recent Labs  05/23/16 2336  05/26/16 0502 05/27/16 05/27/16 0349 05/28/16 05/28/16 0420  WBC 10.8*  < > 9.9 10.3 10.3 7.4 7.4  NEUTROABS 9.6*  --   --   --   --   --   --   HGB 11.9*  < > 10.9* 11.3* 11.3*  --  9.6*  HCT 35.8*  < > 34.1* 35* 34.9*  --  29.7*  MCV 81.9  < > 84.6  --  84.3  --  82.0  PLT 178  < > 171 196 196  --  192  < > = values in this interval not displayed. Lipid No results for input(s): CHOL, HDL, LDLCALC, TRIG in the last 8760 hours.  Cardiac Enzymes: No results for input(s): CKTOTAL, CKMB, CKMBINDEX, TROPONINI in the last 8760 hours. BNP: No results for input(s): BNP in the last 8760 hours. No results found for: [facility] Lab Results  Component Value Date   HGBA1C 5.8 (H) 05/22/2015   No results found for: TSH No results found for: VITAMINB12 No results found for: FOLATE No results found for: IRON, TIBC, FERRITIN  Imaging  and Procedures obtained prior to SNF admission: Ct Head Wo Contrast  Result Date: 05/24/2016 CLINICAL DATA:  Larey Seat while walking to the bathroom. Trauma to the head and neck. EXAM: CT HEAD WITHOUT CONTRAST CT CERVICAL SPINE WITHOUT CONTRAST TECHNIQUE: Multidetector CT imaging of the head and cervical spine was performed following the standard protocol without intravenous contrast. Multiplanar CT image reconstructions of the cervical spine were also generated. COMPARISON:  None. FINDINGS: CT HEAD FINDINGS Brain: The brain shows generalized atrophy. There are chronic small-vessel ischemic changes affecting the basal ganglia and cerebral hemispheric white matter. 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 No evidence of acute infarction. No intra-axial mass lesion, hemorrhage, hydrocephalus or extra-axial collection. Vascular: There is atherosclerotic calcification of the major vessels at the base of the brain. Skull: No skull fracture. 1 cm calcification at the inner table on the right is a benign dural calcification without mass effect. Sinuses/Orbits: Clear/ normal Other: None CT CERVICAL SPINE FINDINGS Alignment: Normal Skull base and vertebrae: Negative Soft tissues and spinal canal: No soft tissue swelling. No significant soft tissue neck finding. Disc levels: Degenerative spondylosis and facet arthropathy without evidence of advanced disease. Facets are fused on the left at C3-4. No likely significant stenosis. Upper chest: Normal Other: None IMPRESSION: Head CT: No acute or traumatic finding. Atrophy and chronic small vessel ischemic change. Cervical spine CT: No acute or traumatic finding. Ordinary age related degenerative changes without likely neural compromise. Electronically Signed   By: Paulina Fusi M.D.   On: 05/24/2016 06:59   Ct Cervical Spine Wo Contrast  Result Date: 05/24/2016 CLINICAL DATA:  Larey Seat while walking to the bathroom. Trauma to the head and neck. EXAM: CT  HEAD WITHOUT CONTRAST CT CERVICAL SPINE WITHOUT CONTRAST TECHNIQUE: Multidetector CT imaging of the head and cervical spine was performed following the standard protocol without intravenous contrast. Multiplanar CT image reconstructions of the cervical spine were also generated. COMPARISON:  None. FINDINGS: CT HEAD  FINDINGS Brain: The brain shows generalized atrophy. There are chronic small-vessel ischemic changes affecting the basal ganglia and cerebral hemispheric white matter. 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 No evidence of acute infarction. No intra-axial mass lesion, hemorrhage, hydrocephalus or extra-axial collection. Vascular: There is atherosclerotic calcification of the major vessels at the base of the brain. Skull: No skull fracture. 1 cm calcification at the inner table on the right is a benign dural calcification without mass effect. Sinuses/Orbits: Clear/ normal Other: None CT CERVICAL SPINE FINDINGS Alignment: Normal Skull base and vertebrae: Negative Soft tissues and spinal canal: No soft tissue swelling. No significant soft tissue neck finding. Disc levels: Degenerative spondylosis and facet arthropathy without evidence of advanced disease. Facets are fused on the left at C3-4. No likely significant stenosis. Upper chest: Normal Other: None IMPRESSION: Head CT: No acute or traumatic finding. Atrophy and chronic small vessel ischemic change. Cervical spine CT: No acute or traumatic finding. Ordinary age related degenerative changes without likely neural compromise. Electronically Signed   By: Paulina FusiMark  Shogry M.D.   On: 05/24/2016 06:59   Dg Knee Left Port  Result Date: 05/24/2016 CLINICAL DATA:  Left knee pain. EXAM: PORTABLE LEFT KNEE - 1-2 VIEW COMPARISON:  None FINDINGS: No joint effusion. There is mild medial compartment and patellofemoral compartment osteoarthritis. Chondrocalcinosis is present. No fracture or subluxation. IMPRESSION: 1. No acute findings. 2.  Mild osteoarthritis and chondrocalcinosis. . Electronically Signed   By: Signa Kellaylor  Stroud M.D.   On: 05/24/2016 13:58    Labs, X rays and other studies from outside the [facility] do not show up in EPIC; however the hard copies are reviewed by me.  Assessment/Plan No problem-specific Assessment & Plan notes found for this encounter.    Labs/tests ordered:   Randon GoldsmithAnne D. Lyn HollingsheadAlexander, MD   This encounter was created in error - please disregard.

## 2016-05-29 NOTE — Telephone Encounter (Signed)
[facility] #[phone] Fax: [phone]  

## 2016-06-01 LAB — CBC AND DIFFERENTIAL
HCT: 30 % — AB (ref 36–46)
Hemoglobin: 9.7 g/dL — AB (ref 12.0–16.0)
Platelets: 324 10*3/uL (ref 150–399)
WBC: 6.7 10^3/mL

## 2016-06-01 LAB — BASIC METABOLIC PANEL
BUN: 23 mg/dL — AB (ref 4–21)
CREATININE: 1.2 mg/dL — AB (ref 0.5–1.1)
Glucose: 155 mg/dL
POTASSIUM: 4.3 mmol/L (ref 3.4–5.3)
SODIUM: 138 mmol/L (ref 137–147)

## 2016-06-10 ENCOUNTER — Encounter: Payer: Self-pay | Admitting: Internal Medicine

## 2016-06-10 DIAGNOSIS — N183 Chronic kidney disease, stage 3 (moderate): Secondary | ICD-10-CM

## 2016-06-10 DIAGNOSIS — R011 Cardiac murmur, unspecified: Secondary | ICD-10-CM | POA: Insufficient documentation

## 2016-06-10 DIAGNOSIS — N179 Acute kidney failure, unspecified: Secondary | ICD-10-CM | POA: Insufficient documentation

## 2016-06-10 DIAGNOSIS — D62 Acute posthemorrhagic anemia: Secondary | ICD-10-CM | POA: Insufficient documentation

## 2016-06-10 NOTE — Progress Notes (Signed)
: Provider:  Merrilee Seashore MD Location:  [facility]   Place of Service:  SNF ([phone])  PCP: [name], MD Patient Care Team: [name], MD as PCP - General (Internal Medicine) [name], MD (Internal Medicine)  Extended Emergency Contact Information Primary Emergency Contact: OLIVER,CYNTHIA Home Phone: [phone] Relation: None Secondary Emergency Contact: Hilario Quarry [place] Home Phone: [phone] Mobile Phone: [phone] Relation: Daughter     Allergies: Penicillins and Penicillins  No chief complaint on file.   HPI: Patient is 82 y.o. female with dementia, hypertension, hyperlipidemia, depression, gastritis , who presents with left hip pain after fall. According to daughter, patient had an unwitnessed fall in the bathroom. She is found to have a displaced left femoral neck fracture.Ptwas admitted to [facility] from 9/21-26 where she underwent a L hip hemiarthroplasty. Hospital course was complicated by acute on CKD3 and by post-op anemia, not requiring transfusion. Pt is admitted to SNF for OT/PT.While at [facility] pt will be followed for HTN, tx with atenolol and coreg, dementia, tx with namenda and HLD, tx with lipitor.  Past Medical History:  Diagnosis Date  . AKI (acute kidney injury) (HCC)   . Closed left hip fracture (HCC)   . Coronary artery disease   . Dementia   . Depression with anxiety   . Fall   . HLD (hyperlipidemia)   . Hyperlipidemia   . Hypertension   . Hypokalemia   . Transient ischemic attack (TIA)     Past Surgical History:  Procedure Laterality Date  . ABDOMINAL HYSTERECTOMY    . ANTERIOR APPROACH HEMI HIP ARTHROPLASTY Left 05/25/2016   Procedure: ANTERIOR APPROACH HEMI HIP ARTHROPLASTY;  Surgeon: Samson Frederic, MD;  Location: [facility] ORS;  Service: Orthopedics;  Laterality: Left;      Medication List       Accurate as of 05/29/16 11:59 PM. Always use your most recent med list.          aspirin 81 MG EC  tablet Take 1 tablet (81 mg total) by mouth 2 (two) times daily with a meal.   atorvastatin 20 MG tablet Commonly known as:  LIPITOR Take 1 tablet (20 mg total) by mouth daily at 6 PM.   bisacodyl 5 MG EC tablet Commonly known as:  DULCOLAX Take 1 tablet (5 mg total) by mouth daily as needed for moderate constipation.   carvedilol 3.125 MG tablet Commonly known as:  COREG Take 1 tablet (3.125 mg total) by mouth 2 (two) times daily with a meal.   feeding supplement (ENSURE ENLIVE) Liqd Take 237 mLs by mouth 2 (two) times daily between meals.   HYDROcodone-acetaminophen 5-325 MG tablet Commonly known as:  NORCO/VICODIN Take one tablet by mouth every 6 hours as needed for Moderate pain Take two tablets by mouth every 6 hours as needed for severe pain   IRON PO Take 65 mg by mouth daily.   LORazepam 1 MG tablet Commonly known as:  ATIVAN Take 1 tablet (1 mg total) by mouth 2 (two) times daily as needed for anxiety.   memantine 10 MG tablet Commonly known as:  NAMENDA Take 10 mg by mouth 2 (two) times daily.   mirtazapine 7.5 MG tablet Commonly known as:  REMERON Take 7.5 mg by mouth at bedtime.   senna-docusate 8.6-50 MG tablet Commonly known as:  Senokot-S Take 1 tablet by mouth at bedtime.       No orders of the defined types were placed in this encounter.  Immunization History  Administered Date(s) Administered  . PPD Test 05/28/2016    Social History  Substance Use Topics  . Smoking status: Never Smoker  . Smokeless tobacco: Never Used  . Alcohol use No    Family history is   Family History  Problem Relation Age of Onset  . Asthma Other   . Heart disease Other       Review of Systems  DATA OBTAINED: from patient, nurse GENERAL:  no fevers, fatigue, appetite changes SKIN: No itching, or rash EYES: No eye pain, redness, discharge EARS: No earache, tinnitus, change in hearing NOSE: No congestion, drainage or bleeding  MOUTH/THROAT: No mouth or  tooth pain, No sore throat RESPIRATORY: No cough, wheezing, SOB CARDIAC: No chest pain, palpitations, lower extremity edema  GI: No abdominal pain, No N/V/D or constipation, No heartburn or reflux  GU: No dysuria, frequency or urgency, or incontinence  MUSCULOSKELETAL: No unrelieved bone/joint pain NEUROLOGIC: No headache, dizziness or focal weakness PSYCHIATRIC: No c/o anxiety or sadness   Vitals:   06/10/16 1549  BP: (!) 148/66  Pulse: 89  Resp: 18  Temp: 98.2 F (36.8 C)    SpO2 Readings from Last 1 Encounters:  06/10/16 92%   Body mass index is 17.81 kg/m.     Physical Exam  GENERAL APPEARANCE: Alert, conversant,  No acute distress.  SKIN: No diaphoresis rash HEAD: Normocephalic, atraumatic  EYES: Conjunctiva/lids clear. Pupils round, reactive. EOMs intact.  EARS: External exam WNL, canals clear. Hearing grossly normal.  NOSE: No deformity or discharge.  MOUTH/THROAT: Lips w/o lesions  RESPIRATORY: Breathing is even, unlabored. Lung sounds are clear   CARDIOVASCULAR: Heart RRR ; 2/6  Murmur, no , rubs or gallops. No peripheral edema.   GASTROINTESTINAL: Abdomen is soft, non-tender, not distended w/ normal bowel sounds. GENITOURINARY: Bladder non tender, not distended  MUSCULOSKELETAL: No abnormal joints or musculature NEUROLOGIC:  Cranial nerves 2-12 grossly intact. Moves all extremities  PSYCHIATRIC: Mood and affect appropriate to situation with dementia, no behavioral issues  Patient Active Problem List   Diagnosis Date Noted  . Postoperative anemia due to acute blood loss 06/10/2016  . Newly recognized heart murmur 06/10/2016  . Acute renal failure superimposed on stage 3 chronic kidney disease (HCC) 06/10/2016  . Chronic combined systolic and diastolic CHF (congestive heart failure) (HCC) 05/28/2016  . Protein-calorie malnutrition, severe 05/25/2016  . Left displaced femoral neck fracture (HCC) 05/25/2016  . Fall 05/24/2016  . Hypokalemia 05/24/2016  . AKI  (acute kidney injury) (HCC) 05/24/2016  . Hypertensive heart disease with chronic combined systolic and diastolic congestive heart failure (HCC)   . Dementia   . Depression with anxiety   . HLD (hyperlipidemia)   . Dysphagia due to recent stroke 05/31/2015  . Hyperlipidemia 05/30/2015  . Vitamin D deficiency 05/30/2015  . Acute CVA (cerebrovascular accident) (HCC)   . CVA (cerebral infarction) 05/21/2015  . Chronic kidney disease, stage 3 05/21/2015  . Dementia 05/21/2015  . Hypertension 05/21/2015  . UTI (urinary tract infection) 05/25/2013  . Acute renal failure (HCC) 05/25/2013  . Syncope 05/24/2013      Labs reviewed: Basic Metabolic Panel:    Component Value Date/Time   NA 136 05/28/2016 0420   NA 136 (A) 05/28/2016   K 4.4 05/28/2016 0420   CL 109 05/28/2016 0420   CO2 22 05/28/2016 0420   GLUCOSE 111 (H) 05/28/2016 0420   BUN 29 (H) 05/28/2016 0420   BUN 29 (A) 05/28/2016   CREATININE 1.24 (H)  05/28/2016 0420   CALCIUM 8.3 (L) 05/28/2016 0420   PROT 7.3 05/21/2015 1507   ALBUMIN 3.7 05/21/2015 1507   AST 23 05/21/2015 1507   ALT 12 (L) 05/21/2015 1507   ALKPHOS 58 05/21/2015 1507   BILITOT 0.6 05/21/2015 1507   GFRNONAA 36 (L) 05/28/2016 0420   GFRAA 41 (L) 05/28/2016 0420     Recent Labs  05/26/16 0502 05/27/16 05/27/16 0349 05/28/16 05/28/16 0420  NA 138 139 139 136* 136  K 4.8 5.1 5.1  --  4.4  CL 108  --  109  --  109  CO2 22  --  23  --  22  GLUCOSE 138*  --  97  --  111*  BUN 27* 39* 39* 29* 29*  CREATININE 1.72* 1.8* 1.76* 1.2* 1.24*  CALCIUM 9.1  --  9.0  --  8.3*   Liver Function Tests: No results for input(s): AST, ALT, ALKPHOS, BILITOT, PROT, ALBUMIN in the last 8760 hours. No results for input(s): LIPASE, AMYLASE in the last 8760 hours. No results for input(s): AMMONIA in the last 8760 hours. CBC:  Recent Labs  05/23/16 2336  05/26/16 0502 05/27/16 05/27/16 0349 05/28/16 05/28/16 0420  WBC 10.8*  < > 9.9 10.3 10.3 7.4 7.4    NEUTROABS 9.6*  --   --   --   --   --   --   HGB 11.9*  < > 10.9* 11.3* 11.3*  --  9.6*  HCT 35.8*  < > 34.1* 35* 34.9*  --  29.7*  MCV 81.9  < > 84.6  --  84.3  --  82.0  PLT 178  < > 171 196 196  --  192  < > = values in this interval not displayed. Lipid No results for input(s): CHOL, HDL, LDLCALC, TRIG in the last 8760 hours.  Cardiac Enzymes: No results for input(s): CKTOTAL, CKMB, CKMBINDEX, TROPONINI in the last 8760 hours. BNP: No results for input(s): BNP in the last 8760 hours. No results found for: [facility] Lab Results  Component Value Date   HGBA1C 5.8 (H) 05/22/2015   No results found for: TSH No results found for: VITAMINB12 No results found for: FOLATE No results found for: IRON, TIBC, FERRITIN  Imaging and Procedures obtained prior to SNF admission: Ct Head Wo Contrast  Result Date: 05/24/2016 CLINICAL DATA:  Larey Seat while walking to the bathroom. Trauma to the head and neck. EXAM: CT HEAD WITHOUT CONTRAST CT CERVICAL SPINE WITHOUT CONTRAST TECHNIQUE: Multidetector CT imaging of the head and cervical spine was performed following the standard protocol without intravenous contrast. Multiplanar CT image reconstructions of the cervical spine were also generated. COMPARISON:  None. FINDINGS: CT HEAD FINDINGS Brain: The brain shows generalized atrophy. There are chronic small-vessel ischemic changes affecting the basal ganglia and cerebral hemispheric white matter. 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 No evidence of acute infarction. No intra-axial mass lesion, hemorrhage, hydrocephalus or extra-axial collection. Vascular: There is atherosclerotic calcification of the major vessels at the base of the brain. Skull: No skull fracture. 1 cm calcification at the inner table on the right is a benign dural calcification without mass effect. Sinuses/Orbits: Clear/ normal Other: None CT CERVICAL SPINE FINDINGS Alignment: Normal Skull base and vertebrae:  Negative Soft tissues and spinal canal: No soft tissue swelling. No significant soft tissue neck finding. Disc levels: Degenerative spondylosis and facet arthropathy without evidence of advanced disease. Facets are fused on the left at C3-4. No  likely significant stenosis. Upper chest: Normal Other: None IMPRESSION: Head CT: No acute or traumatic finding. Atrophy and chronic small vessel ischemic change. Cervical spine CT: No acute or traumatic finding. Ordinary age related degenerative changes without likely neural compromise. Electronically Signed   By: Paulina Fusi M.D.   On: 05/24/2016 06:59   Ct Cervical Spine Wo Contrast  Result Date: 05/24/2016 CLINICAL DATA:  Larey Seat while walking to the bathroom. Trauma to the head and neck. EXAM: CT HEAD WITHOUT CONTRAST CT CERVICAL SPINE WITHOUT CONTRAST TECHNIQUE: Multidetector CT imaging of the head and cervical spine was performed following the standard protocol without intravenous contrast. Multiplanar CT image reconstructions of the cervical spine were also generated. COMPARISON:  None. FINDINGS: CT HEAD FINDINGS Brain: The brain shows generalized atrophy. There are chronic small-vessel ischemic changes affecting the basal ganglia and cerebral hemispheric white matter. 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 No evidence of acute infarction. No intra-axial mass lesion, hemorrhage, hydrocephalus or extra-axial collection. Vascular: There is atherosclerotic calcification of the major vessels at the base of the brain. Skull: No skull fracture. 1 cm calcification at the inner table on the right is a benign dural calcification without mass effect. Sinuses/Orbits: Clear/ normal Other: None CT CERVICAL SPINE FINDINGS Alignment: Normal Skull base and vertebrae: Negative Soft tissues and spinal canal: No soft tissue swelling. No significant soft tissue neck finding. Disc levels: Degenerative spondylosis and facet arthropathy without evidence of  advanced disease. Facets are fused on the left at C3-4. No likely significant stenosis. Upper chest: Normal Other: None IMPRESSION: Head CT: No acute or traumatic finding. Atrophy and chronic small vessel ischemic change. Cervical spine CT: No acute or traumatic finding. Ordinary age related degenerative changes without likely neural compromise. Electronically Signed   By: Paulina Fusi M.D.   On: 05/24/2016 06:59   Dg Knee Left Port  Result Date: 05/24/2016 CLINICAL DATA:  Left knee pain. EXAM: PORTABLE LEFT KNEE - 1-2 VIEW COMPARISON:  None FINDINGS: No joint effusion. There is mild medial compartment and patellofemoral compartment osteoarthritis. Chondrocalcinosis is present. No fracture or subluxation. IMPRESSION: 1. No acute findings. 2. Mild osteoarthritis and chondrocalcinosis. . Electronically Signed   By: Signa Kell M.D.   On: 05/24/2016 13:58   Dg Hip Unilat With Pelvis 2-3 Views Left  Result Date: 05/23/2016 CLINICAL DATA:  Larey Seat.  Left hip pain. EXAM: DG HIP (WITH OR WITHOUT PELVIS) 2-3V LEFT COMPARISON:  None. FINDINGS: There is a displaced femoral neck fracture. Both hips are normally located. The pubic symphysis and SI joints are grossly intact. No obvious pelvic fractures. IMPRESSION: Displaced left femoral neck fracture. Electronically Signed   By: Rudie Meyer M.D.   On: 05/23/2016 23:49     Not all labs, radiology exams or other studies done during hospitalization come through on my EPIC note; however they are reviewed by me.    Assessment and Plan  FALL/L FEMORAL NECK FRACTURE - s/p L hemiarthroplasty 9/23; ASA 81 mg BID for 6 weeks for prophylaxis; SNf - admitted for OT/PT  ACUTE POST-OP ANEMIA - Hb dropped from 11.9 to 9.6- no transfusion needed SNF - will f/u CBC; will cont iron daily  SYSTOLIC AND DIASTOLIC CHF/SYSTOLIC MURMUR - - 2-D echo obtained during hospital stay- discovered to have EF 40-45%, grade 2 Diastolic dysfunction, mod MR, TR and pulm HTN; can  consider starting ARB Once Cr is determined to be stable- otherwise, will need Hydralazine/ Nitrate combination SNF - pt d/c o coreg  2/2 renal function;cont coreg 3/125 mg BID  ACUTE ON CKD3-  Improved with IVF ;Cr from 1.8 to 1.23 SNF - follow up BMP   HTN SNF - controlled;cont coreg 3.125 mg BID  DEMENTIA-  SNF - stable; cont namenda 10 mg BID  HLD SNF - not stated as uncontrolled; cont lipitor 20 mg daily    Time spent > 45 min;> 50% of time with patient was spent reviewing records, labs, tests and studies, counseling and developing plan of care  Merrilee Seashore, MD

## 2016-06-11 ENCOUNTER — Encounter: Payer: Self-pay | Admitting: Internal Medicine

## 2016-06-11 ENCOUNTER — Non-Acute Institutional Stay (SKILLED_NURSING_FACILITY): Payer: Medicare Other | Admitting: Internal Medicine

## 2016-06-11 DIAGNOSIS — R600 Localized edema: Secondary | ICD-10-CM | POA: Diagnosis not present

## 2016-06-11 LAB — BASIC METABOLIC PANEL
BUN: 23 mg/dL — AB (ref 4–21)
CREATININE: 1.4 mg/dL — AB (ref 0.5–1.1)
GLUCOSE: 95 mg/dL
POTASSIUM: 4.1 mmol/L (ref 3.4–5.3)
SODIUM: 143 mmol/L (ref 137–147)

## 2016-06-11 LAB — CBC AND DIFFERENTIAL
HCT: 26 % — AB (ref 36–46)
HEMOGLOBIN: 8.7 g/dL — AB (ref 12.0–16.0)
Platelets: 424 10*3/uL — AB (ref 150–399)
WBC: 6.3 10^3/mL

## 2016-06-11 NOTE — Progress Notes (Signed)
Patient ID: Ariel Douglas, female   DOB: 01-21-1919, 82 y.o.   MRN: [id number] : Provider:   Location:  [facility] Room Number: 105 Place of Service:  SNF (31)  PCP: [name], MD Patient Care Team: [name], MD as PCP - General (Internal Medicine) [name], MD (Internal Medicine)  Extended Emergency Contact Information Primary Emergency Contact: OLIVER,CYNTHIA Home Phone: [phone] Relation: None Secondary Emergency Contact: Hilario Quarry [place] Home Phone: [phone] Mobile Phone: [phone] Relation: Daughter     Allergies: Penicillins and Penicillins  Chief Complaint  Patient presents with  . Acute Visit    HPI: Patient is 82 y.o. female who is s/p L hip fx who is being seen today for pedal edema noted by nursing for last few days. Pt denies pain,denies SOB.  Past Medical History:  Diagnosis Date  . AKI (acute kidney injury) (HCC)   . Closed left hip fracture (HCC)   . Coronary artery disease   . Dementia   . Depression with anxiety   . Fall   . HLD (hyperlipidemia)   . Hyperlipidemia   . Hypertension   . Hypokalemia   . Transient ischemic attack (TIA)     Past Surgical History:  Procedure Laterality Date  . ABDOMINAL HYSTERECTOMY    . ANTERIOR APPROACH HEMI HIP ARTHROPLASTY Left 05/25/2016   Procedure: ANTERIOR APPROACH HEMI HIP ARTHROPLASTY;  Surgeon: Samson Frederic, MD;  Location: [facility];  Service: Orthopedics;  Laterality: Left;      Medication List       Accurate as of 06/11/16  2:57 PM. Always use your most recent med list.          aspirin 81 MG EC tablet Take 1 tablet (81 mg total) by mouth 2 (two) times daily with a meal.   atorvastatin 20 MG tablet Commonly known as:  LIPITOR Take 1 tablet (20 mg total) by mouth daily at 6 PM.   bisacodyl 5 MG EC tablet Commonly known as:  DULCOLAX Take 1 tablet (5 mg total) by mouth daily as needed for moderate constipation.     carvedilol 3.125 MG tablet Commonly known as:  COREG Take 1 tablet (3.125 mg total) by mouth 2 (two) times daily with a meal.   feeding supplement (ENSURE ENLIVE) Liqd Take 237 mLs by mouth 2 (two) times daily between meals.   furosemide 20 MG tablet Commonly known as:  LASIX Take 20 mg by mouth daily.   IRON PO Take 65 mg by mouth daily.   LORazepam 1 MG tablet Commonly known as:  ATIVAN Take 1 tablet (1 mg total) by mouth 2 (two) times daily as needed for anxiety.   memantine 10 MG tablet Commonly known as:  NAMENDA Take 10 mg by mouth 2 (two) times daily.   mirtazapine 15 MG tablet Commonly known as:  REMERON Take 15 mg by mouth at bedtime.   multivitamin tablet Take 1 tablet by mouth daily.   potassium chloride SA 20 MEQ tablet Commonly known as:  K-DUR,KLOR-CON Take 20 mEq by mouth daily.   senna-docusate 8.6-50 MG tablet Commonly known as:  Senokot-S Take 1 tablet by mouth at bedtime.       Meds ordered this encounter  Medications  . furosemide (LASIX) 20 MG tablet    Sig: Take 20 mg by mouth daily.  . potassium chloride SA (K-DUR,KLOR-CON) 20 MEQ tablet    Sig: Take 20 mEq by mouth daily.  . mirtazapine (REMERON)  15 MG tablet    Sig: Take 15 mg by mouth at bedtime.  . Multiple Vitamin (MULTIVITAMIN) tablet    Sig: Take 1 tablet by mouth daily.    Immunization History  Administered Date(s) Administered  . PPD Test 05/28/2016    Social History  Substance Use Topics  . Smoking status: Never Smoker  . Smokeless tobacco: Never Used  . Alcohol use No    Family history is   Family History  Problem Relation Age of Onset  . Asthma Other   . Heart disease Other       Review of Systems  DATA OBTAINED: from patient, nurse+ GENERAL:  no fevers, fatigue, appetite changes SKIN: No itching, or rash EYES: No eye pain, redness, discharge EARS: No earache, tinnitus, change in hearing NOSE: No congestion, drainage or bleeding  MOUTH/THROAT: No  mouth or tooth pain, No sore throat RESPIRATORY: No cough, wheezing, SOB CARDIAC: No chest pain, palpitations, lower extremity edema  GI: No abdominal pain, No N/V/D or constipation, No heartburn or reflux  GU: No dysuria, frequency or urgency, or incontinence  MUSCULOSKELETAL: No unrelieved bone/joint pain NEUROLOGIC: No headache, dizziness or focal weakness PSYCHIATRIC: No c/o anxiety or sadness   Vitals:   06/11/16 1451  BP: 129/73  Pulse: (!) 110  Resp: 20  Temp: 97.1 F (36.2 C)    SpO2 Readings from Last 1 Encounters:  06/10/16 92%   Body mass index is 16.97 kg/m.     Physical Exam  GENERAL APPEARANCE: Alert, conversant,  No acute distress.  SKIN: No diaphoresis rash HEAD: Normocephalic, atraumatic  EYES: Conjunctiva/lids clear. Pupils round, reactive. EOMs intact.  EARS: External exam WNL, canals clear. Hearing grossly normal.  NOSE: No deformity or discharge.  MOUTH/THROAT: Lips w/o lesions  RESPIRATORY: Breathing is even, unlabored. Lung sounds are clear   CARDIOVASCULAR: Heart RRR 2/6 murmur, no  rubs or gallops. 1+ peripheral edema on RLE, 1-2+ LLE GASTROINTESTINAL: Abdomen is soft, non-tender, not distended w/ normal bowel sounds. GENITOURINARY: Bladder non tender, not distended  MUSCULOSKELETAL: No abnormal joints or musculature NEUROLOGIC:  Cranial nerves 2-12 grossly intact. Moves all extremities  PSYCHIATRIC: Mood and affect appropriate to situation, no behavioral issues  Patient Active Problem List   Diagnosis Date Noted  . Postoperative anemia due to acute blood loss 06/10/2016  . Newly recognized heart murmur 06/10/2016  . Acute renal failure superimposed on stage 3 chronic kidney disease (HCC) 06/10/2016  . Chronic combined systolic and diastolic CHF (congestive heart failure) (HCC) 05/28/2016  . Protein-calorie malnutrition, severe 05/25/2016  . Left displaced femoral neck fracture (HCC) 05/25/2016  . Fall 05/24/2016  . Hypokalemia 05/24/2016    . AKI (acute kidney injury) (HCC) 05/24/2016  . Hypertensive heart disease with chronic combined systolic and diastolic congestive heart failure (HCC)   . Dementia   . Depression with anxiety   . HLD (hyperlipidemia)   . Dysphagia due to recent stroke 05/31/2015  . Hyperlipidemia 05/30/2015  . Vitamin D deficiency 05/30/2015  . Acute CVA (cerebrovascular accident) (HCC)   . CVA (cerebral infarction) 05/21/2015  . Chronic kidney disease, stage 3 05/21/2015  . Dementia 05/21/2015  . Hypertension 05/21/2015  . UTI (urinary tract infection) 05/25/2013  . Acute renal failure (HCC) 05/25/2013  . Syncope 05/24/2013      Labs reviewed: Basic Metabolic Panel:    Component Value Date/Time   NA 136 05/28/2016 0420   NA 136 (A) 05/28/2016   K 4.4 05/28/2016 0420   CL 109 05/28/2016  0420   CO2 22 05/28/2016 0420   GLUCOSE 111 (H) 05/28/2016 0420   BUN 29 (H) 05/28/2016 0420   BUN 29 (A) 05/28/2016   CREATININE 1.24 (H) 05/28/2016 0420   CALCIUM 8.3 (L) 05/28/2016 0420   PROT 7.3 05/21/2015 1507   ALBUMIN 3.7 05/21/2015 1507   AST 23 05/21/2015 1507   ALT 12 (L) 05/21/2015 1507   ALKPHOS 58 05/21/2015 1507   BILITOT 0.6 05/21/2015 1507   GFRNONAA 36 (L) 05/28/2016 0420   GFRAA 41 (L) 05/28/2016 0420     Recent Labs  05/26/16 0502 05/27/16 05/27/16 0349 05/28/16 05/28/16 0420  NA 138 139 139 136* 136  K 4.8 5.1 5.1  --  4.4  CL 108  --  109  --  109  CO2 22  --  23  --  22  GLUCOSE 138*  --  97  --  111*  BUN 27* 39* 39* 29* 29*  CREATININE 1.72* 1.8* 1.76* 1.2* 1.24*  CALCIUM 9.1  --  9.0  --  8.3*   Liver Function Tests: No results for input(s): AST, ALT, ALKPHOS, BILITOT, PROT, ALBUMIN in the last 8760 hours. No results for input(s): LIPASE, AMYLASE in the last 8760 hours. No results for input(s): AMMONIA in the last 8760 hours. CBC:  Recent Labs  05/23/16 2336  05/26/16 0502 05/27/16 05/27/16 0349 05/28/16 05/28/16 0420  WBC 10.8*  < > 9.9 10.3 10.3 7.4  7.4  NEUTROABS 9.6*  --   --   --   --   --   --   HGB 11.9*  < > 10.9* 11.3* 11.3*  --  9.6*  HCT 35.8*  < > 34.1* 35* 34.9*  --  29.7*  MCV 81.9  < > 84.6  --  84.3  --  82.0  PLT 178  < > 171 196 196  --  192  < > = values in this interval not displayed. Lipid No results for input(s): CHOL, HDL, LDLCALC, TRIG in the last 8760 hours.  Cardiac Enzymes: No results for input(s): CKTOTAL, CKMB, CKMBINDEX, TROPONINI in the last 8760 hours. BNP: No results for input(s): BNP in the last 8760 hours. No results found for: [facility] Lab Results  Component Value Date   HGBA1C 5.8 (H) 05/22/2015   No results found for: TSH No results found for: VITAMINB12 No results found for: FOLATE No results found for: IRON, TIBC, FERRITIN  Imaging and Procedures obtained prior to SNF admission: Ct Head Wo Contrast  Result Date: 05/24/2016 CLINICAL DATA:  Larey SeatFell while walking to the bathroom. Trauma to the head and neck. EXAM: CT HEAD WITHOUT CONTRAST CT CERVICAL SPINE WITHOUT CONTRAST TECHNIQUE: Multidetector CT imaging of the head and cervical spine was performed following the standard protocol without intravenous contrast. Multiplanar CT image reconstructions of the cervical spine were also generated. COMPARISON:  None. FINDINGS: CT HEAD FINDINGS Brain: The brain shows generalized atrophy. There are chronic small-vessel ischemic changes affecting the basal ganglia and cerebral hemispheric white matter. 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 No evidence of acute infarction. No intra-axial mass lesion, hemorrhage, hydrocephalus or extra-axial collection. Vascular: There is atherosclerotic calcification of the major vessels at the base of the brain. Skull: No skull fracture. 1 cm calcification at the inner table on the right is a benign dural calcification without mass effect. Sinuses/Orbits: Clear/ normal Other: None CT CERVICAL SPINE FINDINGS Alignment: Normal Skull base and  vertebrae: Negative Soft tissues and spinal canal:  No soft tissue swelling. No significant soft tissue neck finding. Disc levels: Degenerative spondylosis and facet arthropathy without evidence of advanced disease. Facets are fused on the left at C3-4. No likely significant stenosis. Upper chest: Normal Other: None IMPRESSION: Head CT: No acute or traumatic finding. Atrophy and chronic small vessel ischemic change. Cervical spine CT: No acute or traumatic finding. Ordinary age related degenerative changes without likely neural compromise. Electronically Signed   By: Paulina Fusi M.D.   On: 05/24/2016 06:59   Ct Cervical Spine Wo Contrast  Result Date: 05/24/2016 CLINICAL DATA:  Larey Seat while walking to the bathroom. Trauma to the head and neck. EXAM: CT HEAD WITHOUT CONTRAST CT CERVICAL SPINE WITHOUT CONTRAST TECHNIQUE: Multidetector CT imaging of the head and cervical spine was performed following the standard protocol without intravenous contrast. Multiplanar CT image reconstructions of the cervical spine were also generated. COMPARISON:  None. FINDINGS: CT HEAD FINDINGS Brain: The brain shows generalized atrophy. There are chronic small-vessel ischemic changes affecting the basal ganglia and cerebral hemispheric white matter. 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 No evidence of acute infarction. No intra-axial mass lesion, hemorrhage, hydrocephalus or extra-axial collection. Vascular: There is atherosclerotic calcification of the major vessels at the base of the brain. Skull: No skull fracture. 1 cm calcification at the inner table on the right is a benign dural calcification without mass effect. Sinuses/Orbits: Clear/ normal Other: None CT CERVICAL SPINE FINDINGS Alignment: Normal Skull base and vertebrae: Negative Soft tissues and spinal canal: No soft tissue swelling. No significant soft tissue neck finding. Disc levels: Degenerative spondylosis and facet arthropathy without  evidence of advanced disease. Facets are fused on the left at C3-4. No likely significant stenosis. Upper chest: Normal Other: None IMPRESSION: Head CT: No acute or traumatic finding. Atrophy and chronic small vessel ischemic change. Cervical spine CT: No acute or traumatic finding. Ordinary age related degenerative changes without likely neural compromise. Electronically Signed   By: Paulina Fusi M.D.   On: 05/24/2016 06:59   Dg Knee Left Port  Result Date: 05/24/2016 CLINICAL DATA:  Left knee pain. EXAM: PORTABLE LEFT KNEE - 1-2 VIEW COMPARISON:  None FINDINGS: No joint effusion. There is mild medial compartment and patellofemoral compartment osteoarthritis. Chondrocalcinosis is present. No fracture or subluxation. IMPRESSION: 1. No acute findings. 2. Mild osteoarthritis and chondrocalcinosis. . Electronically Signed   By: Signa Kell M.D.   On: 05/24/2016 13:58   Dg Hip Unilat With Pelvis 2-3 Views Left  Result Date: 05/23/2016 CLINICAL DATA:  Larey Seat.  Left hip pain. EXAM: DG HIP (WITH OR WITHOUT PELVIS) 2-3V LEFT COMPARISON:  None. FINDINGS: There is a displaced femoral neck fracture. Both hips are normally located. The pubic symphysis and SI joints are grossly intact. No obvious pelvic fractures. IMPRESSION: Displaced left femoral neck fracture. Electronically Signed   By: Rudie Meyer M.D.   On: 05/23/2016 23:49     Not all labs, radiology exams or other studies done during hospitalization come through on my EPIC note; however they are reviewed by me.    Assessment and Plan  B LOWER EXT EDEMA - pt is started on lasix 20 mg daily for 3 days withK+20 meq daily for 3 days ; BMP on 10.13 to follow renal fx and lytes; LLE > RLE; have ordered LLE U/S   Time spent > 25 min Merrilee Seashore, MD

## 2016-06-17 LAB — BASIC METABOLIC PANEL
BUN: 32 mg/dL — AB (ref 4–21)
CREATININE: 1.5 mg/dL — AB (ref 0.5–1.1)
GLUCOSE: 127 mg/dL
Potassium: 4.5 mmol/L (ref 3.4–5.3)
SODIUM: 141 mmol/L (ref 137–147)

## 2016-06-19 LAB — CBC AND DIFFERENTIAL
HEMATOCRIT: 28 % — AB (ref 36–46)
Hemoglobin: 8.7 g/dL — AB (ref 12.0–16.0)
PLATELETS: 300 10*3/uL (ref 150–399)
WBC: 6.1 10^3/mL

## 2016-06-23 ENCOUNTER — Encounter: Payer: Self-pay | Admitting: Internal Medicine

## 2016-06-27 ENCOUNTER — Non-Acute Institutional Stay (SKILLED_NURSING_FACILITY): Payer: Medicare Other | Admitting: Internal Medicine

## 2016-06-27 ENCOUNTER — Encounter: Payer: Self-pay | Admitting: Internal Medicine

## 2016-06-27 DIAGNOSIS — G301 Alzheimer's disease with late onset: Secondary | ICD-10-CM

## 2016-06-27 DIAGNOSIS — I110 Hypertensive heart disease with heart failure: Secondary | ICD-10-CM | POA: Diagnosis not present

## 2016-06-27 DIAGNOSIS — R600 Localized edema: Secondary | ICD-10-CM | POA: Insufficient documentation

## 2016-06-27 DIAGNOSIS — I5042 Chronic combined systolic (congestive) and diastolic (congestive) heart failure: Secondary | ICD-10-CM

## 2016-06-27 DIAGNOSIS — F0280 Dementia in other diseases classified elsewhere without behavioral disturbance: Secondary | ICD-10-CM | POA: Diagnosis not present

## 2016-06-27 DIAGNOSIS — S728X2D Other fracture of left femur, subsequent encounter for closed fracture with routine healing: Secondary | ICD-10-CM | POA: Diagnosis not present

## 2016-06-27 NOTE — Assessment & Plan Note (Signed)
Stable;plan to cont namenda 10 mg BID

## 2016-06-27 NOTE — Assessment & Plan Note (Signed)
r Reasonable control with coreg 3.125 mg BID; < 150/90, appropriate for pt's age

## 2016-06-27 NOTE — Progress Notes (Signed)
Location:  [facility] Room Number: 105P Place of Service:  SNF (31)  Ariel Douglas. [name], MD  Patient Care Team: [name], MD as PCP - General (Internal Medicine) [name], MD (Internal Medicine)  Extended Emergency Contact Information Primary Emergency Contact: OLIVER,CYNTHIA Home Phone: [phone] Relation: None Secondary Emergency Contact: Hilario Quarry [place] Home Phone: [phone] Mobile Phone: [phone] Relation: Daughter    Allergies: Penicillins and Penicillins  Chief Complaint  Patient presents with  . Medical Management of Chronic Issues    Routine Visit    HPI: Patient is 82 y.o. female who is being seen for routine issues of HTN, dementia and pedal edema/CHF.  Past Medical History:  Diagnosis Date  . AKI (acute kidney injury) (HCC)   . Closed left hip fracture (HCC)   . Coronary artery disease   . Dementia   . Depression with anxiety   . Fall   . HLD (hyperlipidemia)   . Hyperlipidemia   . Hypertension   . Hypokalemia   . Transient ischemic attack (TIA)     Past Surgical History:  Procedure Laterality Date  . ABDOMINAL HYSTERECTOMY    . ANTERIOR APPROACH HEMI HIP ARTHROPLASTY Left 05/25/2016   Procedure: ANTERIOR APPROACH HEMI HIP ARTHROPLASTY;  Surgeon: Samson Frederic, MD;  Location: [facility];  Service: Orthopedics;  Laterality: Left;      Medication List       Accurate as of 06/27/16  2:55 PM. Always use your most recent med list.          aspirin 81 MG EC tablet Take 1 tablet (81 mg total) by mouth 2 (two) times daily with a meal.   atorvastatin 20 MG tablet Commonly known as:  LIPITOR Take 1 tablet (20 mg total) by mouth daily at 6 PM.   bisacodyl 5 MG EC tablet Commonly known as:  DULCOLAX Take 1 tablet (5 mg total) by mouth daily as needed for moderate constipation.   carvedilol 3.125 MG tablet Commonly known as:  COREG Take 1 tablet (3.125 mg total) by mouth 2 (two)  times daily with a meal.   feeding supplement (ENSURE ENLIVE) Liqd Take 237 mLs by mouth 2 (two) times daily between meals.   HYDROcodone-acetaminophen 5-325 MG tablet Commonly known as:  NORCO/VICODIN Take 1 tablet by mouth every 8 (eight) hours as needed for moderate pain or severe pain.   IRON PO Take 65 mg by mouth daily.   LORazepam 1 MG tablet Commonly known as:  ATIVAN Take 1 tablet (1 mg total) by mouth 2 (two) times daily as needed for anxiety.   memantine 10 MG tablet Commonly known as:  NAMENDA Take 10 mg by mouth 2 (two) times daily.   mirtazapine 15 MG tablet Commonly known as:  REMERON Take 15 mg by mouth at bedtime.   multivitamin tablet Take 1 tablet by mouth daily.   senna-docusate 8.6-50 MG tablet Commonly known as:  Senokot-S Take 1 tablet by mouth at bedtime.       Meds ordered this encounter  Medications  . HYDROcodone-acetaminophen (NORCO/VICODIN) 5-325 MG tablet    Sig: Take 1 tablet by mouth every 8 (eight) hours as needed for moderate pain or severe pain.  Marland Kitchen DISCONTD: ferrous sulfate 325 (65 FE) MG tablet    Sig: Take 325 mg by mouth daily with breakfast.    Immunization History  Administered Date(s) Administered  . PPD Test 05/28/2016    Social History  Substance Use  Topics  . Smoking status: Never Smoker  . Smokeless tobacco: Never Used  . Alcohol use No    Review of Systems  DATA OBTAINED: from patient- can only minimally participate nurse- no concerns , pedal edema is stable GENERAL:  no fevers, fatigue, appetite changes SKIN: No itching, rash HEENT: No complaint RESPIRATORY: No cough, wheezing, SOB CARDIAC: No chest pain, palpitations, lower extremity edema  GI: No abdominal pain, No N/V/D or constipation, No heartburn or reflux  GU: No dysuria, frequency or urgency, or incontinence  MUSCULOSKELETAL: No unrelieved bone/joint pain NEUROLOGIC: No headache, dizziness  PSYCHIATRIC: No overt anxiety or sadness  Vitals:    06/27/16 1220  BP: (!) 147/78  Pulse: 96  Resp: 20  Temp: 98.8 F (37.1 C)   Body mass index is 18.14 kg/m. Physical Exam  GENERAL APPEARANCE: Alert, mod conversant, No acute distress  SKIN: No diaphoresis rash HEENT: Unremarkable RESPIRATORY: Breathing is even, unlabored. Lung sounds are clear   CARDIOVASCULAR: Heart RRR no murmurs, rubs or gallops. LLE 1-2+ peripheral edema; RLE 1+ GASTROINTESTINAL: Abdomen is soft, non-tender, not distended w/ normal bowel sounds.  GENITOURINARY: Bladder non tender, not distended  MUSCULOSKELETAL: No abnormal joints or musculature NEUROLOGIC: Cranial nerves 2-12 grossly intact. Moves all extremities PSYCHIATRIC: Mood and affect appropriate to situation with dementia, no behavioral issues  Patient Active Problem List   Diagnosis Date Noted  . Pedal edema 06/27/2016  . Postoperative anemia due to acute blood loss 06/10/2016  . Newly recognized heart murmur 06/10/2016  . Acute renal failure superimposed on stage 3 chronic kidney disease (HCC) 06/10/2016  . Chronic combined systolic and diastolic CHF (congestive heart failure) (HCC) 05/28/2016  . Protein-calorie malnutrition, severe 05/25/2016  . Left displaced femoral neck fracture (HCC) 05/25/2016  . Fall 05/24/2016  . Hypokalemia 05/24/2016  . AKI (acute kidney injury) (HCC) 05/24/2016  . Hypertensive heart disease with chronic combined systolic and diastolic congestive heart failure (HCC)   . Dementia   . Depression with anxiety   . HLD (hyperlipidemia)   . Dysphagia due to recent stroke 05/31/2015  . Hyperlipidemia 05/30/2015  . Vitamin D deficiency 05/30/2015  . Acute CVA (cerebrovascular accident) (HCC)   . CVA (cerebral infarction) 05/21/2015  . Chronic kidney disease, stage 3 05/21/2015  . Dementia 05/21/2015  . Hypertension 05/21/2015  . UTI (urinary tract infection) 05/25/2013  . Acute renal failure (HCC) 05/25/2013  . Syncope 05/24/2013    CMP     Component Value  Date/Time   NA 141 06/17/2016   K 4.5 06/17/2016   CL 109 05/28/2016 0420   CO2 22 05/28/2016 0420   GLUCOSE 111 (H) 05/28/2016 0420   BUN 32 (A) 06/17/2016   CREATININE 1.5 (A) 06/17/2016   CREATININE 1.24 (H) 05/28/2016 0420   CALCIUM 8.3 (L) 05/28/2016 0420   PROT 7.3 05/21/2015 1507   ALBUMIN 3.7 05/21/2015 1507   AST 23 05/21/2015 1507   ALT 12 (L) 05/21/2015 1507   ALKPHOS 58 05/21/2015 1507   BILITOT 0.6 05/21/2015 1507   GFRNONAA 36 (L) 05/28/2016 0420   GFRAA 41 (L) 05/28/2016 0420    Recent Labs  05/26/16 0502  05/27/16 0349  05/28/16 0420 06/01/16 06/11/16 06/17/16  NA 138  < > 139  < > 136 138 143 141  K 4.8  < > 5.1  --  4.4 4.3 4.1 4.5  CL 108  --  109  --  109  --   --   --   CO2 22  --  23  --  22  --   --   --   GLUCOSE 138*  --  97  --  111*  --   --   --   BUN 27*  < > 39*  < > 29* 23* 23* 32*  CREATININE 1.72*  < > 1.76*  < > 1.24* 1.2* 1.4* 1.5*  CALCIUM 9.1  --  9.0  --  8.3*  --   --   --   < > = values in this interval not displayed. No results for input(s): AST, ALT, ALKPHOS, BILITOT, PROT, ALBUMIN in the last 8760 hours.  Recent Labs  05/23/16 2336  05/26/16 0502  05/27/16 0349  05/28/16 0420 06/01/16 06/11/16 06/19/16  WBC 10.8*  < > 9.9  < > 10.3  < > 7.4 6.7 6.3 6.1  NEUTROABS 9.6*  --   --   --   --   --   --   --   --   --   HGB 11.9*  < > 10.9*  < > 11.3*  --  9.6* 9.7* 8.7* 8.7*  HCT 35.8*  < > 34.1*  < > 34.9*  --  29.7* 30* 26* 28*  MCV 81.9  < > 84.6  --  84.3  --  82.0  --   --   --   PLT 178  < > 171  < > 196  --  192 324 424* 300  < > = values in this interval not displayed. No results for input(s): CHOL, LDLCALC, TRIG in the last 8760 hours.  Invalid input(s): HCL No results found for: MICROALBUR No results found for: TSH Lab Results  Component Value Date   HGBA1C 5.8 (H) 05/22/2015   Lab Results  Component Value Date   CHOL 248 (H) 05/22/2015   HDL 46 05/22/2015   LDLCALC 187 (H) 05/22/2015   TRIG 75 05/22/2015    CHOLHDL 5.4 05/22/2015    Significant Diagnostic Results in last 30 days:  No results found.  Assessment and Plan  Hypertensive heart disease with chronic combined systolic and diastolic congestive heart failure (HCC) r Reasonable control with coreg 3.125 mg BID; < 150/90, appropriate for pt's age  Dementia Stable;plan to cont namenda 10 mg BID   PEDAL EDEMA/CHF - pt was on 3 days of lasix 20 mg and BUN/CR started to rise. Pt still with pedal edema; maybe lasix 20 mg MWF; will flu BMP  11/6    Ariel Douglas D. [name], MD

## 2016-06-28 DIAGNOSIS — E039 Hypothyroidism, unspecified: Secondary | ICD-10-CM | POA: Diagnosis not present

## 2016-06-28 DIAGNOSIS — I10 Essential (primary) hypertension: Secondary | ICD-10-CM | POA: Diagnosis not present

## 2016-06-28 DIAGNOSIS — D559 Anemia due to enzyme disorder, unspecified: Secondary | ICD-10-CM | POA: Diagnosis not present

## 2016-06-28 DIAGNOSIS — E785 Hyperlipidemia, unspecified: Secondary | ICD-10-CM | POA: Diagnosis not present

## 2016-06-28 DIAGNOSIS — E559 Vitamin D deficiency, unspecified: Secondary | ICD-10-CM | POA: Diagnosis not present

## 2016-06-28 DIAGNOSIS — Z79899 Other long term (current) drug therapy: Secondary | ICD-10-CM | POA: Diagnosis not present

## 2016-06-28 DIAGNOSIS — D649 Anemia, unspecified: Secondary | ICD-10-CM | POA: Diagnosis not present

## 2016-06-28 LAB — BASIC METABOLIC PANEL
BUN: 28 mg/dL — AB (ref 4–21)
Creatinine: 1.4 mg/dL — AB (ref 0.5–1.1)
Glucose: 115 mg/dL
POTASSIUM: 4.1 mmol/L (ref 3.4–5.3)
SODIUM: 139 mmol/L (ref 137–147)

## 2016-06-28 LAB — HEPATIC FUNCTION PANEL
ALT: 14 U/L (ref 7–35)
AST: 19 U/L (ref 13–35)
Alkaline Phosphatase: 114 U/L (ref 25–125)
BILIRUBIN, TOTAL: 0.4 mg/dL

## 2016-06-28 LAB — HEMOGLOBIN A1C: HEMOGLOBIN A1C: 5.4

## 2016-06-28 LAB — CBC AND DIFFERENTIAL
HEMATOCRIT: 32 % — AB (ref 36–46)
Hemoglobin: 10.7 g/dL — AB (ref 12.0–16.0)
Platelets: 287 10*3/uL (ref 150–399)
WBC: 14.4 10^3/mL

## 2016-06-28 LAB — LIPID PANEL
Cholesterol: 147 mg/dL (ref 0–200)
HDL: 62 mg/dL (ref 35–70)
LDL CALC: 73 mg/dL
TRIGLYCERIDES: 61 mg/dL (ref 40–160)

## 2016-06-28 LAB — TSH: TSH: 1.88 u[IU]/mL (ref 0.41–5.90)

## 2016-07-01 ENCOUNTER — Encounter: Payer: Self-pay | Admitting: Internal Medicine

## 2016-07-01 ENCOUNTER — Non-Acute Institutional Stay (SKILLED_NURSING_FACILITY): Payer: Medicare Other | Admitting: Internal Medicine

## 2016-07-01 DIAGNOSIS — R5081 Fever presenting with conditions classified elsewhere: Secondary | ICD-10-CM | POA: Diagnosis not present

## 2016-07-01 DIAGNOSIS — R0989 Other specified symptoms and signs involving the circulatory and respiratory systems: Secondary | ICD-10-CM | POA: Diagnosis not present

## 2016-07-01 DIAGNOSIS — N390 Urinary tract infection, site not specified: Secondary | ICD-10-CM | POA: Diagnosis not present

## 2016-07-01 DIAGNOSIS — R197 Diarrhea, unspecified: Secondary | ICD-10-CM | POA: Diagnosis not present

## 2016-07-01 NOTE — Progress Notes (Signed)
[facility] Room Number: 105P Place of Service:  SNF (31)  Ariel Douglas D. Ariel HollingsheadAlexander, MD  Patient Care Team: Pearson GrippeJames Kim, MD as PCP - General (Internal Medicine) Pearson GrippeJames Kim, MD (Internal Medicine)  Extended Emergency Contact Information Primary Emergency Contact: OLIVER,CYNTHIA Home Phone: [phone] Relation: None Secondary Emergency Contact: Hilario Quarryaylor,[name]  [place] Home Phone: [phone] Mobile Phone: [phone] Relation: Daughter    Allergies: Penicillins and Penicillins  Chief Complaint  Patient presents with  . Acute Visit    Acute     HPI: Patient is 82 y.o. female who nursing asked me to see for being sick. Pt reported to have a fever 102 yesterday ans diarrhea. The on call person ordered a urine and C Diff on the stool. No coughs or SOB reported. No fever today or reported diarrhea.  Past Medical History:  Diagnosis Date  . AKI (acute kidney injury) (HCC)   . Closed left hip fracture (HCC)   . Coronary artery disease   . Dementia   . Depression with anxiety   . Fall   . HLD (hyperlipidemia)   . Hyperlipidemia   . Hypertension   . Hypokalemia   . Transient ischemic attack (TIA)     Past Surgical History:  Procedure Laterality Date  . ABDOMINAL HYSTERECTOMY    . ANTERIOR APPROACH HEMI HIP ARTHROPLASTY Left 05/25/2016   Procedure: ANTERIOR APPROACH HEMI HIP ARTHROPLASTY;  Surgeon: Samson FredericBrian Swinteck, MD;  Location: [facility];  Service: Orthopedics;  Laterality: Left;      Medication List       Accurate as of 07/01/16 12:05 PM. Always use your most recent med list.          aspirin 81 MG EC tablet Take 1 tablet (81 mg total) by mouth 2 (two) times daily with a meal.   atorvastatin 20 MG tablet Commonly known as:  LIPITOR Take 1 tablet (20 mg total) by mouth daily at 6 PM.   bisacodyl 5 MG EC tablet Commonly known as:  DULCOLAX Take 1 tablet (5 mg total) by mouth daily as needed for moderate  constipation.   carvedilol 3.125 MG tablet Commonly known as:  COREG Take 1 tablet (3.125 mg total) by mouth 2 (two) times daily with a meal.   ciprofloxacin 500 MG tablet Commonly known as:  CIPRO Take 500 mg by mouth 2 (two) times daily.   feeding supplement (ENSURE ENLIVE) Liqd Take 237 mLs by mouth 2 (two) times daily between meals.   HYDROcodone-acetaminophen 5-325 MG tablet Commonly known as:  NORCO/VICODIN Take 1 tablet by mouth every 8 (eight) hours as needed for moderate pain or severe pain.   IRON PO Take 65 mg by mouth daily.   LORazepam 1 MG tablet Commonly known as:  ATIVAN Take 1 tablet (1 mg total) by mouth 2 (two) times daily as needed for anxiety.   memantine 10 MG tablet Commonly known as:  NAMENDA Take 10 mg by mouth 2 (two) times daily.   mirtazapine 15 MG tablet Commonly known as:  REMERON Take 15 mg by mouth at bedtime.   multivitamin tablet Take 1 tablet by mouth daily.   saccharomyces boulardii 250 MG capsule Commonly known as:  FLORASTOR Take 250 mg by mouth 2 (two) times daily.   senna-docusate 8.6-50 MG tablet Commonly known as:  Senokot-S Take 1 tablet by mouth at bedtime.       Meds ordered this encounter  Medications  . ciprofloxacin (CIPRO) 500  MG tablet    Sig: Take 500 mg by mouth 2 (two) times daily.  Marland Kitchen saccharomyces boulardii (FLORASTOR) 250 MG capsule    Sig: Take 250 mg by mouth 2 (two) times daily.    Immunization History  Administered Date(s) Administered  . PPD Test 05/28/2016    Social History  Substance Use Topics  . Smoking status: Never Smoker  . Smokeless tobacco: Never Used  . Alcohol use No    Review of Systems  UTO 2/2 dementia    Vitals:   07/01/16 1153  BP: 130/76  Pulse: (!) 107  Resp: (!) 21  Temp: 98.9 F (37.2 C)   Body mass index is 17.76 kg/m. Physical Exam  GENERAL APPEARANCE: Alert, min conversant, No acute distress , very sweet black female SKIN: No diaphoresis rash HEENT:  Unremarkable RESPIRATORY: Breathing is even, unlabored. Lung sounds are clear   CARDIOVASCULAR: Heart RRR no murmurs, rubs or gallops. No peripheral edema  GASTROINTESTINAL: Abdomen is soft, non-tender, not distended w/ normal bowel sounds.  GENITOURINARY: Bladder non tender, not distended  MUSCULOSKELETAL: No abnormal joints or musculature NEUROLOGIC: Cranial nerves 2-12 grossly intact. Moves all extremities PSYCHIATRIC: dementia, no behavioral issues  Patient Active Problem List   Diagnosis Date Noted  . Pedal edema 06/27/2016  . Postoperative anemia due to acute blood loss 06/10/2016  . Newly recognized heart murmur 06/10/2016  . Acute renal failure superimposed on stage 3 chronic kidney disease (HCC) 06/10/2016  . Chronic combined systolic and diastolic CHF (congestive heart failure) (HCC) 05/28/2016  . Protein-calorie malnutrition, severe 05/25/2016  . Left displaced femoral neck fracture (HCC) 05/25/2016  . Fall 05/24/2016  . Hypokalemia 05/24/2016  . AKI (acute kidney injury) (HCC) 05/24/2016  . Hypertensive heart disease with chronic combined systolic and diastolic congestive heart failure (HCC)   . Dementia   . Depression with anxiety   . HLD (hyperlipidemia)   . Dysphagia due to recent stroke 05/31/2015  . Hyperlipidemia 05/30/2015  . Vitamin D deficiency 05/30/2015  . Acute CVA (cerebrovascular accident) (HCC)   . CVA (cerebral infarction) 05/21/2015  . Chronic kidney disease, stage 3 05/21/2015  . Dementia 05/21/2015  . Hypertension 05/21/2015  . UTI (urinary tract infection) 05/25/2013  . Acute renal failure (HCC) 05/25/2013  . Syncope 05/24/2013    CMP     Component Value Date/Time   NA 141 06/17/2016   K 4.5 06/17/2016   CL 109 05/28/2016 0420   CO2 22 05/28/2016 0420   GLUCOSE 111 (H) 05/28/2016 0420   BUN 32 (A) 06/17/2016   CREATININE 1.5 (A) 06/17/2016   CREATININE 1.24 (H) 05/28/2016 0420   CALCIUM 8.3 (L) 05/28/2016 0420   PROT 7.3 05/21/2015  1507   ALBUMIN 3.7 05/21/2015 1507   AST 23 05/21/2015 1507   ALT 12 (L) 05/21/2015 1507   ALKPHOS 58 05/21/2015 1507   BILITOT 0.6 05/21/2015 1507   GFRNONAA 36 (L) 05/28/2016 0420   GFRAA 41 (L) 05/28/2016 0420    Recent Labs  05/26/16 0502  05/27/16 0349  05/28/16 0420 06/01/16 06/11/16 06/17/16  NA 138  < > 139  < > 136 138 143 141  K 4.8  < > 5.1  --  4.4 4.3 4.1 4.5  CL 108  --  109  --  109  --   --   --   CO2 22  --  23  --  22  --   --   --   GLUCOSE 138*  --  97  --  111*  --   --   --   BUN 27*  < > 39*  < > 29* 23* 23* 32*  CREATININE 1.72*  < > 1.76*  < > 1.24* 1.2* 1.4* 1.5*  CALCIUM 9.1  --  9.0  --  8.3*  --   --   --   < > = values in this interval not displayed. No results for input(s): AST, ALT, ALKPHOS, BILITOT, PROT, ALBUMIN in the last 8760 hours.  Recent Labs  05/23/16 2336  05/26/16 0502  05/27/16 0349  05/28/16 0420 06/01/16 06/11/16 06/19/16  WBC 10.8*  < > 9.9  < > 10.3  < > 7.4 6.7 6.3 6.1  NEUTROABS 9.6*  --   --   --   --   --   --   --   --   --   HGB 11.9*  < > 10.9*  < > 11.3*  --  9.6* 9.7* 8.7* 8.7*  HCT 35.8*  < > 34.1*  < > 34.9*  --  29.7* 30* 26* 28*  MCV 81.9  < > 84.6  --  84.3  --  82.0  --   --   --   PLT 178  < > 171  < > 196  --  192 324 424* 300  < > = values in this interval not displayed. No results for input(s): CHOL, LDLCALC, TRIG in the last 8760 hours.  Invalid input(s): HCL No results found for: MICROALBUR No results found for: TSH Lab Results  Component Value Date   HGBA1C 5.8 (H) 05/22/2015   Lab Results  Component Value Date   CHOL 248 (H) 05/22/2015   HDL 46 05/22/2015   LDLCALC 187 (H) 05/22/2015   TRIG 75 05/22/2015   CHOLHDL 5.4 05/22/2015    Significant Diagnostic Results in last 30 days:  No results found.  Assessment and Plan  FEVER/DIARRHEA - could be anything, UTI, viral - was started on Cipro for a UTI by on call, will cont for now. Have ordered CBC, BMP and CXR; U/A and C Diff was ordered  yesterday;will recheck tomorrow    Time spent > 25 min Zachrey Deutscher D. Ariel HollingsheadAlexander, MD

## 2016-07-02 ENCOUNTER — Non-Acute Institutional Stay (SKILLED_NURSING_FACILITY): Payer: Medicare Other | Admitting: Internal Medicine

## 2016-07-02 ENCOUNTER — Encounter: Payer: Self-pay | Admitting: Internal Medicine

## 2016-07-02 DIAGNOSIS — A0472 Enterocolitis due to Clostridium difficile, not specified as recurrent: Secondary | ICD-10-CM

## 2016-07-02 NOTE — Progress Notes (Signed)
[facility] Room Number: 105P Place of Service:  SNF (31)  [name], MD  Patient Care Team: Pearson GrippeJames Kim, MD as PCP - General (Internal Medicine) Pearson GrippeJames Kim, MD (Internal Medicine)  Extended Emergency Contact Information Primary Emergency Contact: OLIVER,CYNTHIA Home Phone: [phone] Relation: None Secondary Emergency Contact: Hilario Quarryaylor,[name]  [place] Home Phone: [phone] Mobile Phone: [phone] Relation: Daughter    Allergies: Penicillins and Penicillins  Chief Complaint  Patient presents with  . Acute Visit    Acute    HPI: Patient is 82 y.o. female who is being seen in f/u for yesterday. Pt's C diff came back + . Pt is not currently having diarrhea or fever.  Past Medical History:  Diagnosis Date  . AKI (acute kidney injury) (HCC)   . Closed left hip fracture (HCC)   . Coronary artery disease   . Dementia   . Depression with anxiety   . Fall   . HLD (hyperlipidemia)   . Hyperlipidemia   . Hypertension   . Hypokalemia   . Transient ischemic attack (TIA)     Past Surgical History:  Procedure Laterality Date  . ABDOMINAL HYSTERECTOMY    . ANTERIOR APPROACH HEMI HIP ARTHROPLASTY Left 05/25/2016   Procedure: ANTERIOR APPROACH HEMI HIP ARTHROPLASTY;  Surgeon: Samson FredericBrian Swinteck, MD;  Location: [facility];  Service: Orthopedics;  Laterality: Left;      Medication List       Accurate as of 07/02/16  1:52 PM. Always use your most recent med list.          aspirin 81 MG EC tablet Take 1 tablet (81 mg total) by mouth 2 (two) times daily with a meal.   atorvastatin 20 MG tablet Commonly known as:  LIPITOR Take 1 tablet (20 mg total) by mouth daily at 6 PM.   bisacodyl 5 MG EC tablet Commonly known as:  DULCOLAX Take 1 tablet (5 mg total) by mouth daily as needed for moderate constipation.   carvedilol 3.125 MG tablet Commonly known as:  COREG Take 1 tablet (3.125 mg total) by mouth 2  (two) times daily with a meal.   ciprofloxacin 500 MG tablet Commonly known as:  CIPRO Take 500 mg by mouth 2 (two) times daily.   feeding supplement (ENSURE ENLIVE) Liqd Take 237 mLs by mouth 2 (two) times daily between meals.   HYDROcodone-acetaminophen 5-325 MG tablet Commonly known as:  NORCO/VICODIN Take 1 tablet by mouth every 8 (eight) hours as needed for moderate pain or severe pain.   IRON PO Take 65 mg by mouth daily.   LORazepam 1 MG tablet Commonly known as:  ATIVAN Take 1 tablet (1 mg total) by mouth 2 (two) times daily as needed for anxiety.   memantine 10 MG tablet Commonly known as:  NAMENDA Take 10 mg by mouth 2 (two) times daily.   mirtazapine 15 MG tablet Commonly known as:  REMERON Take 15 mg by mouth at bedtime.   multivitamin tablet Take 1 tablet by mouth daily.   saccharomyces boulardii 250 MG capsule Commonly known as:  FLORASTOR Take 250 mg by mouth 2 (two) times daily.   senna-docusate 8.6-50 MG tablet Commonly known as:  Senokot-S Take 1 tablet by mouth at bedtime.       No orders of the defined types were placed in this encounter.   Immunization History  Administered Date(s) Administered  . PPD Test 05/28/2016    Social History  Substance  Use Topics  . Smoking status: Never Smoker  . Smokeless tobacco: Never Used  . Alcohol use No    Review of Systems  UTO 2/2 dementia    Vitals:   07/02/16 1203  BP: (!) 110/52  Pulse: 92  Resp: 18  Temp: 97.8 F (36.6 C)   Body mass index is 17.64 kg/m. Physical Exam  GENERAL APPEARANCE: Alert, min conversant, No acute distress  SKIN: No diaphoresis rash HEENT: Unremarkable RESPIRATORY: Breathing is even, unlabored. Lung sounds are clear   CARDIOVASCULAR: Heart RRR no murmurs, rubs or gallops. No peripheral edema  GASTROINTESTINAL: Abdomen is soft, non-tender, not distended w/ normal bowel sounds.  GENITOURINARY: Bladder non tender, not distended  MUSCULOSKELETAL: No abnormal  joints or musculature NEUROLOGIC: Cranial nerves 2-12 grossly intact. Moves all extremities PSYCHIATRIC: dementia, no behavioral issues  Patient Active Problem List   Diagnosis Date Noted  . Pedal edema 06/27/2016  . Postoperative anemia due to acute blood loss 06/10/2016  . Newly recognized heart murmur 06/10/2016  . Acute renal failure superimposed on stage 3 chronic kidney disease (HCC) 06/10/2016  . Chronic combined systolic and diastolic CHF (congestive heart failure) (HCC) 05/28/2016  . Protein-calorie malnutrition, severe 05/25/2016  . Left displaced femoral neck fracture (HCC) 05/25/2016  . Fall 05/24/2016  . Hypokalemia 05/24/2016  . AKI (acute kidney injury) (HCC) 05/24/2016  . Hypertensive heart disease with chronic combined systolic and diastolic congestive heart failure (HCC)   . Dementia   . Depression with anxiety   . HLD (hyperlipidemia)   . Dysphagia due to recent stroke 05/31/2015  . Hyperlipidemia 05/30/2015  . Vitamin D deficiency 05/30/2015  . Acute CVA (cerebrovascular accident) (HCC)   . CVA (cerebral infarction) 05/21/2015  . Chronic kidney disease, stage 3 05/21/2015  . Dementia 05/21/2015  . Hypertension 05/21/2015  . UTI (urinary tract infection) 05/25/2013  . Acute renal failure (HCC) 05/25/2013  . Syncope 05/24/2013    CMP     Component Value Date/Time   NA 139 06/28/2016   K 4.1 06/28/2016   CL 109 05/28/2016 0420   CO2 22 05/28/2016 0420   GLUCOSE 111 (H) 05/28/2016 0420   BUN 28 (A) 06/28/2016   CREATININE 1.4 (A) 06/28/2016   CREATININE 1.24 (H) 05/28/2016 0420   CALCIUM 8.3 (L) 05/28/2016 0420   PROT 7.3 05/21/2015 1507   ALBUMIN 3.7 05/21/2015 1507   AST 19 06/28/2016   ALT 14 06/28/2016   ALKPHOS 114 06/28/2016   BILITOT 0.6 05/21/2015 1507   GFRNONAA 36 (L) 05/28/2016 0420   GFRAA 41 (L) 05/28/2016 0420    Recent Labs  05/26/16 0502  05/27/16 0349  05/28/16 0420  06/11/16 06/17/16 06/28/16  NA 138  < > 139  < > 136  < >  143 141 139  K 4.8  < > 5.1  --  4.4  < > 4.1 4.5 4.1  CL 108  --  109  --  109  --   --   --   --   CO2 22  --  23  --  22  --   --   --   --   GLUCOSE 138*  --  97  --  111*  --   --   --   --   BUN 27*  < > 39*  < > 29*  < > 23* 32* 28*  CREATININE 1.72*  < > 1.76*  < > 1.24*  < > 1.4* 1.5* 1.4*  CALCIUM  9.1  --  9.0  --  8.3*  --   --   --   --   < > = values in this interval not displayed.  Recent Labs  06/28/16  AST 19  ALT 14  ALKPHOS 114    Recent Labs  05/23/16 2336  05/26/16 0502  05/27/16 0349  05/28/16 0420  06/11/16 06/19/16 06/28/16  WBC 10.8*  < > 9.9  < > 10.3  < > 7.4  < > 6.3 6.1 14.4  NEUTROABS 9.6*  --   --   --   --   --   --   --   --   --   --   HGB 11.9*  < > 10.9*  < > 11.3*  --  9.6*  < > 8.7* 8.7* 10.7*  HCT 35.8*  < > 34.1*  < > 34.9*  --  29.7*  < > 26* 28* 32*  MCV 81.9  < > 84.6  --  84.3  --  82.0  --   --   --   --   PLT 178  < > 171  < > 196  --  192  < > 424* 300 287  < > = values in this interval not displayed.  Recent Labs  06/28/16  CHOL 147  LDLCALC 73  TRIG 61   No results found for: [facility] Lab Results  Component Value Date   TSH 1.88 06/28/2016   Lab Results  Component Value Date   HGBA1C 5.4 06/28/2016   Lab Results  Component Value Date   CHOL 147 06/28/2016   HDL 62 06/28/2016   LDLCALC 73 06/28/2016   TRIG 61 06/28/2016   CHOLHDL 5.4 05/22/2015    Albumin     3.5 Vitamin D   54   Significant Diagnostic Results in last 30 days:  No results found.  Assessment and Plan  C DIFF - pt was started on flagyl 500 mg TID - needs 10 days which is what was  Started; will cont to monitor     Thurston Holenne D. Lyn HollingsheadAlexander, MD

## 2016-07-04 DIAGNOSIS — I10 Essential (primary) hypertension: Secondary | ICD-10-CM | POA: Diagnosis not present

## 2016-07-04 LAB — BASIC METABOLIC PANEL
BUN: 25 mg/dL — AB (ref 4–21)
CREATININE: 1.6 mg/dL — AB (ref 0.5–1.1)
Glucose: 109 mg/dL
POTASSIUM: 3.8 mmol/L (ref 3.4–5.3)
SODIUM: 145 mmol/L (ref 137–147)

## 2016-07-04 LAB — CBC AND DIFFERENTIAL
HCT: 32 % — AB (ref 36–46)
HEMOGLOBIN: 10.2 g/dL — AB (ref 12.0–16.0)
Platelets: 358 10*3/uL (ref 150–399)
WBC: 11.4 10^3/mL

## 2016-07-07 ENCOUNTER — Encounter: Payer: Self-pay | Admitting: Internal Medicine

## 2016-07-07 DIAGNOSIS — A0472 Enterocolitis due to Clostridium difficile, not specified as recurrent: Secondary | ICD-10-CM | POA: Insufficient documentation

## 2016-07-16 ENCOUNTER — Encounter (HOSPITAL_COMMUNITY): Payer: Self-pay

## 2016-07-16 ENCOUNTER — Emergency Department (HOSPITAL_COMMUNITY): Payer: Medicare Other

## 2016-07-16 ENCOUNTER — Observation Stay (HOSPITAL_COMMUNITY)
Admission: EM | Admit: 2016-07-16 | Discharge: 2016-07-19 | Disposition: A | Discharge status: Skilled Nursing Facility | Payer: Medicare Other | Attending: Internal Medicine | Admitting: Internal Medicine

## 2016-07-16 DIAGNOSIS — M7989 Other specified soft tissue disorders: Secondary | ICD-10-CM

## 2016-07-16 DIAGNOSIS — R404 Transient alteration of awareness: Secondary | ICD-10-CM | POA: Diagnosis not present

## 2016-07-16 DIAGNOSIS — F418 Other specified anxiety disorders: Secondary | ICD-10-CM | POA: Insufficient documentation

## 2016-07-16 DIAGNOSIS — E785 Hyperlipidemia, unspecified: Secondary | ICD-10-CM | POA: Diagnosis present

## 2016-07-16 DIAGNOSIS — F0390 Unspecified dementia without behavioral disturbance: Secondary | ICD-10-CM | POA: Insufficient documentation

## 2016-07-16 DIAGNOSIS — Z880 Allergy status to penicillin: Secondary | ICD-10-CM | POA: Diagnosis not present

## 2016-07-16 DIAGNOSIS — R600 Localized edema: Secondary | ICD-10-CM | POA: Diagnosis not present

## 2016-07-16 DIAGNOSIS — I700 Atherosclerosis of aorta: Secondary | ICD-10-CM | POA: Insufficient documentation

## 2016-07-16 DIAGNOSIS — I2510 Atherosclerotic heart disease of native coronary artery without angina pectoris: Secondary | ICD-10-CM | POA: Insufficient documentation

## 2016-07-16 DIAGNOSIS — Z90710 Acquired absence of both cervix and uterus: Secondary | ICD-10-CM | POA: Diagnosis not present

## 2016-07-16 DIAGNOSIS — Z681 Body mass index (BMI) 19 or less, adult: Secondary | ICD-10-CM | POA: Insufficient documentation

## 2016-07-16 DIAGNOSIS — N183 Chronic kidney disease, stage 3 unspecified: Secondary | ICD-10-CM | POA: Diagnosis present

## 2016-07-16 DIAGNOSIS — I493 Ventricular premature depolarization: Secondary | ICD-10-CM | POA: Insufficient documentation

## 2016-07-16 DIAGNOSIS — R000 Tachycardia, unspecified: Secondary | ICD-10-CM | POA: Insufficient documentation

## 2016-07-16 DIAGNOSIS — Z7982 Long term (current) use of aspirin: Secondary | ICD-10-CM | POA: Diagnosis not present

## 2016-07-16 DIAGNOSIS — I130 Hypertensive heart and chronic kidney disease with heart failure and stage 1 through stage 4 chronic kidney disease, or unspecified chronic kidney disease: Secondary | ICD-10-CM | POA: Diagnosis not present

## 2016-07-16 DIAGNOSIS — Z21 Asymptomatic human immunodeficiency virus [HIV] infection status: Secondary | ICD-10-CM | POA: Diagnosis not present

## 2016-07-16 DIAGNOSIS — D72829 Elevated white blood cell count, unspecified: Secondary | ICD-10-CM | POA: Diagnosis present

## 2016-07-16 DIAGNOSIS — Z9889 Other specified postprocedural states: Secondary | ICD-10-CM

## 2016-07-16 DIAGNOSIS — Z8673 Personal history of transient ischemic attack (TIA), and cerebral infarction without residual deficits: Secondary | ICD-10-CM | POA: Diagnosis not present

## 2016-07-16 DIAGNOSIS — Z8249 Family history of ischemic heart disease and other diseases of the circulatory system: Secondary | ICD-10-CM | POA: Diagnosis not present

## 2016-07-16 DIAGNOSIS — R410 Disorientation, unspecified: Secondary | ICD-10-CM | POA: Diagnosis not present

## 2016-07-16 DIAGNOSIS — Z825 Family history of asthma and other chronic lower respiratory diseases: Secondary | ICD-10-CM | POA: Diagnosis not present

## 2016-07-16 DIAGNOSIS — I5042 Chronic combined systolic (congestive) and diastolic (congestive) heart failure: Secondary | ICD-10-CM | POA: Insufficient documentation

## 2016-07-16 DIAGNOSIS — L8990 Pressure ulcer of unspecified site, unspecified stage: Secondary | ICD-10-CM | POA: Insufficient documentation

## 2016-07-16 DIAGNOSIS — E440 Moderate protein-calorie malnutrition: Secondary | ICD-10-CM | POA: Diagnosis not present

## 2016-07-16 DIAGNOSIS — R55 Syncope and collapse: Secondary | ICD-10-CM | POA: Diagnosis not present

## 2016-07-16 DIAGNOSIS — I10 Essential (primary) hypertension: Secondary | ICD-10-CM | POA: Diagnosis present

## 2016-07-16 LAB — CBC WITH DIFFERENTIAL/PLATELET
Basophils Absolute: 0 10*3/uL (ref 0.0–0.1)
Basophils Relative: 0 %
EOS ABS: 0 10*3/uL (ref 0.0–0.7)
EOS PCT: 0 %
HCT: 31.2 % — ABNORMAL LOW (ref 36.0–46.0)
Hemoglobin: 10 g/dL — ABNORMAL LOW (ref 12.0–15.0)
LYMPHS ABS: 1.2 10*3/uL (ref 0.7–4.0)
Lymphocytes Relative: 5 %
MCH: 26.2 pg (ref 26.0–34.0)
MCHC: 32.1 g/dL (ref 30.0–36.0)
MCV: 81.9 fL (ref 78.0–100.0)
MONOS PCT: 5 %
Monocytes Absolute: 1.2 10*3/uL — ABNORMAL HIGH (ref 0.1–1.0)
Neutro Abs: 19.7 10*3/uL — ABNORMAL HIGH (ref 1.7–7.7)
Neutrophils Relative %: 90 %
PLATELETS: 347 10*3/uL (ref 150–400)
RBC: 3.81 MIL/uL — ABNORMAL LOW (ref 3.87–5.11)
RDW: 16.4 % — ABNORMAL HIGH (ref 11.5–15.5)
WBC: 22.1 10*3/uL — AB (ref 4.0–10.5)

## 2016-07-16 LAB — COMPREHENSIVE METABOLIC PANEL
ALT: 10 U/L — ABNORMAL LOW (ref 14–54)
ANION GAP: 10 (ref 5–15)
AST: 19 U/L (ref 15–41)
Albumin: 2.8 g/dL — ABNORMAL LOW (ref 3.5–5.0)
Alkaline Phosphatase: 72 U/L (ref 38–126)
BUN: 21 mg/dL — ABNORMAL HIGH (ref 6–20)
CHLORIDE: 106 mmol/L (ref 101–111)
CO2: 21 mmol/L — AB (ref 22–32)
Calcium: 9 mg/dL (ref 8.9–10.3)
Creatinine, Ser: 1.36 mg/dL — ABNORMAL HIGH (ref 0.44–1.00)
GFR calc non Af Amer: 32 mL/min — ABNORMAL LOW (ref >60)
GFR, EST AFRICAN AMERICAN: 37 mL/min — AB (ref >60)
Glucose, Bld: 149 mg/dL — ABNORMAL HIGH (ref 65–99)
POTASSIUM: 3.6 mmol/L (ref 3.5–5.1)
SODIUM: 137 mmol/L (ref 135–145)
Total Bilirubin: 0.9 mg/dL (ref 0.3–1.2)
Total Protein: 6.2 g/dL — ABNORMAL LOW (ref 6.5–8.1)

## 2016-07-16 LAB — CBC AND DIFFERENTIAL
HEMATOCRIT: 31 % — AB (ref 36–46)
Hemoglobin: 10 g/dL — AB (ref 12.0–16.0)
Platelets: 347 10*3/uL (ref 150–399)
WBC: 22.1 10*3/mL

## 2016-07-16 LAB — BASIC METABOLIC PANEL
BUN: 21 mg/dL (ref 4–21)
Creatinine: 1.4 mg/dL — AB (ref 0.5–1.1)
GLUCOSE: 149 mg/dL
Potassium: 3.6 mmol/L (ref 3.4–5.3)
SODIUM: 137 mmol/L (ref 137–147)

## 2016-07-16 LAB — CBG MONITORING, ED: Glucose-Capillary: 137 mg/dL — ABNORMAL HIGH (ref 65–99)

## 2016-07-16 LAB — D-DIMER, QUANTITATIVE: D-Dimer, Quant: 3.23 ug/mL-FEU — ABNORMAL HIGH (ref 0.00–0.50)

## 2016-07-16 LAB — HEPATIC FUNCTION PANEL: Bilirubin, Total: 0.9 mg/dL

## 2016-07-16 MED ORDER — SODIUM CHLORIDE 0.9 % IV BOLUS (SEPSIS)
500.0000 mL | Freq: Once | INTRAVENOUS | Status: AC
  Administered 2016-07-16: 500 mL via INTRAVENOUS

## 2016-07-16 NOTE — ED Triage Notes (Signed)
Pt to ED via [facility] from [facility].  Staff reports pt was unresponsive for approx 2-3 mins.  On EMS arrival pt was alert per her norm.

## 2016-07-16 NOTE — ED Provider Notes (Signed)
[facility]-EMERGENCY DEPT Provider Note   CSN: [id number] Arrival date & time: 07/16/16  2008  History   Chief Complaint Chief Complaint  Patient presents with  . Loss of Consciousness    HPI Ariel Douglas is a 82 y.o. female.   Loss of Consciousness   This is a new problem. The current episode started 1 to 2 hours ago. The problem occurs rarely. The problem has been resolved. She lost consciousness for a period of 1 to 5 minutes. Pertinent negatives include abdominal pain, chest pain, confusion, fever, nausea, palpitations, seizures, slurred speech and vomiting.    Past Medical History:  Diagnosis Date  . AKI (acute kidney injury) (HCC)   . Closed left hip fracture (HCC)   . Coronary artery disease   . Dementia   . Depression with anxiety   . Fall   . HLD (hyperlipidemia)   . Hyperlipidemia   . Hypertension   . Hypokalemia   . Transient ischemic attack (TIA)     Patient Active Problem List   Diagnosis Date Noted  . C. difficile diarrhea 07/07/2016  . Pedal edema 06/27/2016  . Postoperative anemia due to acute blood loss 06/10/2016  . Newly recognized heart murmur 06/10/2016  . Acute renal failure superimposed on stage 3 chronic kidney disease (HCC) 06/10/2016  . Chronic combined systolic and diastolic CHF (congestive heart failure) (HCC) 05/28/2016  . Protein-calorie malnutrition, severe 05/25/2016  . Left displaced femoral neck fracture (HCC) 05/25/2016  . Fall 05/24/2016  . Hypokalemia 05/24/2016  . AKI (acute kidney injury) (HCC) 05/24/2016  . Hypertensive heart disease with chronic combined systolic and diastolic congestive heart failure (HCC)   . Dementia   . Depression with anxiety   . HLD (hyperlipidemia)   . Dysphagia due to recent stroke 05/31/2015  . Hyperlipidemia 05/30/2015  . Vitamin D deficiency 05/30/2015  . Acute CVA (cerebrovascular accident) (HCC)   . CVA (cerebral infarction) 05/21/2015  . Chronic kidney disease, stage 3 05/21/2015  .  Dementia 05/21/2015  . Hypertension 05/21/2015  . UTI (urinary tract infection) 05/25/2013  . Acute renal failure (HCC) 05/25/2013  . Syncope 05/24/2013    Past Surgical History:  Procedure Laterality Date  . ABDOMINAL HYSTERECTOMY    . ANTERIOR APPROACH HEMI HIP ARTHROPLASTY Left 05/25/2016   Procedure: ANTERIOR APPROACH HEMI HIP ARTHROPLASTY;  Surgeon: Samson FredericBrian Swinteck, MD;  Location: [facility];  Service: Orthopedics;  Laterality: Left;    OB History    No data available       Home Medications    Prior to Admission medications   Medication Sig Start Date End Date Taking? Authorizing Provider  aspirin EC 81 MG EC tablet Take 1 tablet (81 mg total) by mouth 2 (two) times daily with a meal. 05/28/16   Samson FredericBrian Swinteck, MD  atorvastatin (LIPITOR) 20 MG tablet Take 1 tablet (20 mg total) by mouth daily at 6 PM. 05/24/15   Joseph ArtJessica U Vann, DO  bisacodyl (DULCOLAX) 5 MG EC tablet Take 1 tablet (5 mg total) by mouth daily as needed for moderate constipation. 05/28/16   Calvert CantorSaima Rizwan, MD  carvedilol (COREG) 3.125 MG tablet Take 1 tablet (3.125 mg total) by mouth 2 (two) times daily with a meal. 05/28/16   Calvert CantorSaima Rizwan, MD  feeding supplement, ENSURE ENLIVE, (ENSURE ENLIVE) LIQD Take 237 mLs by mouth 2 (two) times daily between meals. 05/28/16   Calvert CantorSaima Rizwan, MD  HYDROcodone-acetaminophen (NORCO/VICODIN) 5-325 MG tablet Take 1 tablet by mouth every 8 (eight) hours as needed for  moderate pain or severe pain.    Historical Provider, MD  IRON PO Take 65 mg by mouth daily.    Historical Provider, MD  LORazepam (ATIVAN) 1 MG tablet Take 1 tablet (1 mg total) by mouth 2 (two) times daily as needed for anxiety. 05/28/16   Calvert Cantor, MD  memantine (NAMENDA) 10 MG tablet Take 10 mg by mouth 2 (two) times daily.    Historical Provider, MD  mirtazapine (REMERON) 15 MG tablet Take 15 mg by mouth at bedtime.    Historical Provider, MD  Multiple Vitamin (MULTIVITAMIN) tablet Take 1 tablet by mouth daily.     Historical Provider, MD  senna-docusate (SENOKOT-S) 8.6-50 MG tablet Take 1 tablet by mouth at bedtime. 05/28/16   Calvert Cantor, MD    Family History Family History  Problem Relation Age of Onset  . Asthma Other   . Heart disease Other     Social History Social History  Substance Use Topics  . Smoking status: Never Smoker  . Smokeless tobacco: Never Used  . Alcohol use No     Allergies   Penicillins and Penicillins   Review of Systems Review of Systems  Unable to perform ROS: Dementia (Limited by dementia but story otherwise provided by family)  Constitutional: Negative for fever.  Cardiovascular: Positive for syncope. Negative for chest pain and palpitations.  Gastrointestinal: Negative for abdominal pain, nausea and vomiting.  Neurological: Negative for seizures.  Psychiatric/Behavioral: Negative for confusion.   Physical Exam Updated Vital Signs BP 116/83 (BP Location: Right Arm)   Pulse 113   Temp 98.1 F (36.7 C) (Oral)   Resp 26   Ht 5\' 4"  (1.626 m)   Wt 45.4 kg   SpO2 100%   BMI 17.16 kg/m   Physical Exam  Constitutional: She appears well-developed and well-nourished.  Frail, elderly female  HENT:  Head: Normocephalic.  Eyes:  Pinpoint bilateral pupils  Neck: Normal range of motion. Neck supple.  Cardiovascular: Regular rhythm.   Tachycardic   Pulmonary/Chest: Effort normal. No respiratory distress.  Abdominal: Soft. She exhibits no distension. There is no tenderness. There is no guarding.  Musculoskeletal:  Bilateral edema   L> R with tenderness to palpation of left calf  Stage 1 sacral ulcer  Left hip laceration healed without induration, erythema or pain.   Neurological: She is alert. No cranial nerve deficit. Coordination normal.  Pleasantly confused  Bilateral equal plantar flexion, dorsiflexion, hip flexion.  Bilateral equal intrinsic hand strength, bicep flexion, tricep extension.  Skin: Skin is warm. Capillary refill takes less  than 2 seconds.  Left lateral area discoloration/necrotic callus  Right posterior calcaneal necrotic callus  Psychiatric: She has a normal mood and affect. Her behavior is normal.  Nursing note and vitals reviewed.    ED Treatments / Results  Labs (all labs ordered are listed, but only abnormal results are displayed) Labs Reviewed  CBC WITH DIFFERENTIAL/PLATELET - Abnormal; Notable for the following:       Result Value   WBC 22.1 (*)    RBC 3.81 (*)    Hemoglobin 10.0 (*)    HCT 31.2 (*)    RDW 16.4 (*)    Neutro Abs 19.7 (*)    Monocytes Absolute 1.2 (*)    All other components within normal limits  COMPREHENSIVE METABOLIC PANEL - Abnormal; Notable for the following:    CO2 21 (*)    Glucose, Bld 149 (*)    BUN 21 (*)    Creatinine, Ser 1.36 (*)  Total Protein 6.2 (*)    Albumin 2.8 (*)    ALT 10 (*)    GFR calc non Af Amer 32 (*)    GFR calc Af Amer 37 (*)    All other components within normal limits  URINALYSIS, ROUTINE W REFLEX MICROSCOPIC (NOT AT [facility]) - Abnormal; Notable for the following:    Hgb urine dipstick SMALL (*)    All other components within normal limits  D-DIMER, QUANTITATIVE (NOT AT [facility]) - Abnormal; Notable for the following:    D-Dimer, Quant 3.23 (*)    All other components within normal limits  URINE MICROSCOPIC-ADD ON - Abnormal; Notable for the following:    Squamous Epithelial / LPF 0-5 (*)    Bacteria, UA FEW (*)    All other components within normal limits  CBG MONITORING, ED - Abnormal; Notable for the following:    Glucose-Capillary 137 (*)    All other components within normal limits    EKG  EKG Interpretation None       Radiology Dg Chest Portable 1 View  Result Date: 07/16/2016 CLINICAL DATA:  Leukocytosis, transient confusion and dementia. EXAM: PORTABLE CHEST 1 VIEW COMPARISON:  05/25/2016 FINDINGS: There is stable cardiomegaly with aortic atherosclerosis. Lung volumes are slightly low with crowding of interstitial lung  markings and mild vascular congestion. No pneumonic consolidation, effusion or pneumothorax. Osteoarthritis of the glenohumeral joints bilaterally. IMPRESSION: Stable cardiomegaly with aortic atherosclerosis. Mild pulmonary vascular congestion without pneumonic consolidations. No acute osseous abnormality. Electronically Signed   By: Tollie Ethavid  Kwon M.D.   On: 07/16/2016 23:30   Procedures Procedures (including critical care time)  Medications Ordered in ED Medications - No data to display   Initial Impression / Assessment and Plan / ED Course  I have reviewed the triage vital signs and the nursing notes.  Pertinent labs & imaging results that were available during my care of the patient were reviewed by me and considered in my medical decision making (see chart for details).  Clinical Course    Patient is a pleasant 82 year old female multiple comorbidities who presents emergency department today with possible syncopal episode. Patient was reportedly more confused and "not acting herself this afternoon per the granddaughter" when she noticed that she had slumped over in a chair. She was reportedly unresponsive and then when placed back in her bed, completely responsive and back to her baseline.  Upon evaluation, patient slightly tachypneic and tachycardic. She has a significant history of left hip replacement and has been nonambulatory since. Patient was also described pain in her left lower extremity with swelling. Upon physical exam patient has bilateral actually swelling but with possible swelling worse on left than right.   Patient does not have leukocytosis unknown etiology. Possible UA. Patient has a stage I sacral decubitus ulcer but no additional infectious etiology. Chest x-rays unremarkable for pneumonia. UA without obvious sign of infection.   Given her syncopal workup, patient's GFR is not appropriate for CTA of her chest. Recommend hospital admission for possible V/Q scan and further  workup for her syncope.   I spoke with Dr. Clyde LundborgNiu, who quickly assessed the patient a deemed appropriate for admission. CT -head was requested and patient admitted to hospitalist service.    Final Clinical Impressions(s) / ED Diagnoses   Final diagnoses:  Syncope, unspecified syncope type  Leg swelling  History of repair of hip fracture  Leukocytosis, unspecified type     Deirdre PeerJeremiah Marguerette Sheller, MD 07/17/16 0045    Gerhard Munchobert Lockwood, MD  07/20/16 2046  

## 2016-07-17 ENCOUNTER — Observation Stay (HOSPITAL_COMMUNITY): Payer: Medicare Other

## 2016-07-17 DIAGNOSIS — I5042 Chronic combined systolic (congestive) and diastolic (congestive) heart failure: Secondary | ICD-10-CM

## 2016-07-17 DIAGNOSIS — D72829 Elevated white blood cell count, unspecified: Secondary | ICD-10-CM

## 2016-07-17 DIAGNOSIS — N183 Chronic kidney disease, stage 3 (moderate): Secondary | ICD-10-CM

## 2016-07-17 DIAGNOSIS — R55 Syncope and collapse: Secondary | ICD-10-CM

## 2016-07-17 DIAGNOSIS — E440 Moderate protein-calorie malnutrition: Secondary | ICD-10-CM | POA: Insufficient documentation

## 2016-07-17 DIAGNOSIS — F418 Other specified anxiety disorders: Secondary | ICD-10-CM

## 2016-07-17 DIAGNOSIS — F0280 Dementia in other diseases classified elsewhere without behavioral disturbance: Secondary | ICD-10-CM

## 2016-07-17 DIAGNOSIS — R0602 Shortness of breath: Secondary | ICD-10-CM | POA: Diagnosis not present

## 2016-07-17 DIAGNOSIS — I10 Essential (primary) hypertension: Secondary | ICD-10-CM

## 2016-07-17 DIAGNOSIS — L8990 Pressure ulcer of unspecified site, unspecified stage: Secondary | ICD-10-CM | POA: Insufficient documentation

## 2016-07-17 DIAGNOSIS — G301 Alzheimer's disease with late onset: Secondary | ICD-10-CM

## 2016-07-17 HISTORY — DX: Elevated white blood cell count, unspecified: D72.829

## 2016-07-17 LAB — BASIC METABOLIC PANEL
ANION GAP: 9 (ref 5–15)
BUN: 18 mg/dL (ref 6–20)
BUN: 18 mg/dL (ref 4–21)
CHLORIDE: 108 mmol/L (ref 101–111)
CO2: 23 mmol/L (ref 22–32)
CREATININE: 1.4 mg/dL — AB (ref 0.5–1.1)
Calcium: 9.1 mg/dL (ref 8.9–10.3)
Creatinine, Ser: 1.41 mg/dL — ABNORMAL HIGH (ref 0.44–1.00)
GFR calc Af Amer: 35 mL/min — ABNORMAL LOW (ref >60)
GFR, EST NON AFRICAN AMERICAN: 31 mL/min — AB (ref >60)
GLUCOSE: 136 mg/dL — AB (ref 65–99)
GLUCOSE: 136 mg/dL
POTASSIUM: 3.9 mmol/L (ref 3.5–5.1)
Potassium: 3.9 mmol/L (ref 3.4–5.3)
Sodium: 140 mmol/L (ref 135–145)
Sodium: 140 mmol/L (ref 137–147)

## 2016-07-17 LAB — URINALYSIS, ROUTINE W REFLEX MICROSCOPIC
Bilirubin Urine: NEGATIVE
GLUCOSE, UA: NEGATIVE mg/dL
KETONES UR: NEGATIVE mg/dL
LEUKOCYTES UA: NEGATIVE
NITRITE: NEGATIVE
PH: 5.5 (ref 5.0–8.0)
Protein, ur: NEGATIVE mg/dL
Specific Gravity, Urine: 1.007 (ref 1.005–1.030)

## 2016-07-17 LAB — CBC AND DIFFERENTIAL
HCT: 32 % — AB (ref 36–46)
Hemoglobin: 10.1 g/dL — AB (ref 12.0–16.0)
PLATELETS: 342 10*3/uL (ref 150–399)
WBC: 20.2 10^3/mL

## 2016-07-17 LAB — URINE MICROSCOPIC-ADD ON

## 2016-07-17 LAB — CBC
HEMATOCRIT: 31.7 % — AB (ref 36.0–46.0)
HEMOGLOBIN: 10.1 g/dL — AB (ref 12.0–15.0)
MCH: 26.3 pg (ref 26.0–34.0)
MCHC: 31.9 g/dL (ref 30.0–36.0)
MCV: 82.6 fL (ref 78.0–100.0)
Platelets: 342 10*3/uL (ref 150–400)
RBC: 3.84 MIL/uL — ABNORMAL LOW (ref 3.87–5.11)
RDW: 16.4 % — AB (ref 11.5–15.5)
WBC: 20.2 10*3/uL — ABNORMAL HIGH (ref 4.0–10.5)

## 2016-07-17 LAB — GLUCOSE, CAPILLARY
GLUCOSE-CAPILLARY: 108 mg/dL — AB (ref 65–99)
GLUCOSE-CAPILLARY: 91 mg/dL (ref 65–99)

## 2016-07-17 LAB — PROTIME-INR
INR: 0.99
Prothrombin Time: 13.1 seconds (ref 11.4–15.2)

## 2016-07-17 LAB — BRAIN NATRIURETIC PEPTIDE: B NATRIURETIC PEPTIDE 5: 388.7 pg/mL — AB (ref 0.0–100.0)

## 2016-07-17 LAB — MRSA PCR SCREENING: MRSA by PCR: NEGATIVE

## 2016-07-17 LAB — TROPONIN I
TROPONIN I: 0.04 ng/mL — AB (ref <0.03)
Troponin I: <0.03 ng/mL (ref <0.03)
Troponin I: <0.03 ng/mL (ref <0.03)

## 2016-07-17 LAB — HEPARIN LEVEL (UNFRACTIONATED)
HEPARIN UNFRACTIONATED: 0.23 [IU]/mL — AB (ref 0.30–0.70)
Heparin Unfractionated: 0.16 IU/mL — ABNORMAL LOW (ref 0.30–0.70)

## 2016-07-17 LAB — LACTIC ACID, PLASMA: LACTIC ACID, VENOUS: 1.6 mmol/L (ref 0.5–1.9)

## 2016-07-17 MED ORDER — FERROUS SULFATE 325 (65 FE) MG PO TABS
325.0000 mg | ORAL_TABLET | Freq: Every day | ORAL | Status: DC
  Administered 2016-07-19: 325 mg via ORAL
  Filled 2016-07-17 (×2): qty 1

## 2016-07-17 MED ORDER — ONDANSETRON HCL 4 MG/2ML IJ SOLN
4.0000 mg | Freq: Four times a day (QID) | INTRAMUSCULAR | Status: DC | PRN
Start: 2016-07-17 — End: 2016-07-19

## 2016-07-17 MED ORDER — ATORVASTATIN CALCIUM 10 MG PO TABS
20.0000 mg | ORAL_TABLET | Freq: Every day | ORAL | Status: DC
  Administered 2016-07-17 – 2016-07-18 (×2): 20 mg via ORAL
  Filled 2016-07-17 (×2): qty 2

## 2016-07-17 MED ORDER — ADULT MULTIVITAMIN W/MINERALS CH
1.0000 | ORAL_TABLET | Freq: Every day | ORAL | Status: DC
  Administered 2016-07-18 – 2016-07-19 (×2): 1 via ORAL
  Filled 2016-07-17 (×3): qty 1

## 2016-07-17 MED ORDER — MIRTAZAPINE 15 MG PO TABS
15.0000 mg | ORAL_TABLET | Freq: Every day | ORAL | Status: DC
  Administered 2016-07-17 – 2016-07-18 (×2): 15 mg via ORAL
  Filled 2016-07-17 (×2): qty 1

## 2016-07-17 MED ORDER — ASPIRIN EC 81 MG PO TBEC
81.0000 mg | DELAYED_RELEASE_TABLET | Freq: Two times a day (BID) | ORAL | Status: DC
  Administered 2016-07-17 – 2016-07-19 (×3): 81 mg via ORAL
  Filled 2016-07-17 (×4): qty 1

## 2016-07-17 MED ORDER — HEPARIN BOLUS VIA INFUSION
2000.0000 [IU] | Freq: Once | INTRAVENOUS | Status: AC
  Administered 2016-07-17: 2000 [IU] via INTRAVENOUS
  Filled 2016-07-17: qty 2000

## 2016-07-17 MED ORDER — TECHNETIUM TC 99M DIETHYLENETRIAME-PENTAACETIC ACID: 32.0000 | Freq: Once | INTRAVENOUS | Status: DC | PRN

## 2016-07-17 MED ORDER — HEPARIN (PORCINE) IN NACL 100-0.45 UNIT/ML-% IJ SOLN
850.0000 [IU]/h | INTRAMUSCULAR | Status: DC
  Administered 2016-07-17: 850 [IU]/h via INTRAVENOUS
  Filled 2016-07-17: qty 250

## 2016-07-17 MED ORDER — ACETAMINOPHEN 650 MG RE SUPP: 650.0000 mg | Freq: Four times a day (QID) | RECTAL | Status: DC | PRN

## 2016-07-17 MED ORDER — CARVEDILOL 3.125 MG PO TABS
3.1250 mg | ORAL_TABLET | Freq: Two times a day (BID) | ORAL | Status: DC
  Administered 2016-07-17 – 2016-07-19 (×3): 3.125 mg via ORAL
  Filled 2016-07-17 (×4): qty 1

## 2016-07-17 MED ORDER — SENNOSIDES-DOCUSATE SODIUM 8.6-50 MG PO TABS
1.0000 | ORAL_TABLET | Freq: Every day | ORAL | Status: DC
  Administered 2016-07-17 – 2016-07-18 (×2): 1 via ORAL
  Filled 2016-07-17 (×2): qty 1

## 2016-07-17 MED ORDER — HYDROCODONE-ACETAMINOPHEN 5-325 MG PO TABS: 1.0000 | ORAL_TABLET | Freq: Three times a day (TID) | ORAL | Status: DC | PRN

## 2016-07-17 MED ORDER — HEPARIN SODIUM (PORCINE) 5000 UNIT/ML IJ SOLN: 5000.0000 [IU] | Freq: Three times a day (TID) | INTRAMUSCULAR | Status: DC

## 2016-07-17 MED ORDER — INFLUENZA VAC SPLIT QUAD 0.5 ML IM SUSY
0.5000 mL | PREFILLED_SYRINGE | INTRAMUSCULAR | Status: AC
  Administered 2016-07-19: 0.5 mL via INTRAMUSCULAR
  Filled 2016-07-17: qty 0.5

## 2016-07-17 MED ORDER — SODIUM CHLORIDE 0.9% FLUSH
3.0000 mL | Freq: Two times a day (BID) | INTRAVENOUS | Status: DC
  Administered 2016-07-17 – 2016-07-19 (×3): 3 mL via INTRAVENOUS

## 2016-07-17 MED ORDER — SODIUM CHLORIDE 0.9 % IV SOLN
INTRAVENOUS | Status: DC
  Administered 2016-07-17 (×2): via INTRAVENOUS

## 2016-07-17 MED ORDER — ACETAMINOPHEN 325 MG PO TABS: 650.0000 mg | ORAL_TABLET | Freq: Four times a day (QID) | ORAL | Status: DC | PRN

## 2016-07-17 MED ORDER — HEPARIN BOLUS VIA INFUSION
1350.0000 [IU] | Freq: Once | INTRAVENOUS | Status: AC
  Administered 2016-07-17: 1350 [IU] via INTRAVENOUS
  Filled 2016-07-17: qty 1350

## 2016-07-17 MED ORDER — HEPARIN (PORCINE) IN NACL 100-0.45 UNIT/ML-% IJ SOLN
600.0000 [IU]/h | INTRAMUSCULAR | Status: DC
  Administered 2016-07-17: 600 [IU]/h via INTRAVENOUS
  Filled 2016-07-17: qty 250

## 2016-07-17 MED ORDER — ENSURE ENLIVE PO LIQD
237.0000 mL | Freq: Two times a day (BID) | ORAL | Status: DC
  Administered 2016-07-18 – 2016-07-19 (×3): 237 mL via ORAL

## 2016-07-17 MED ORDER — ONDANSETRON HCL 4 MG PO TABS: 4.0000 mg | ORAL_TABLET | Freq: Four times a day (QID) | ORAL | Status: DC | PRN

## 2016-07-17 MED ORDER — LORAZEPAM 1 MG PO TABS: 1.0000 mg | ORAL_TABLET | Freq: Two times a day (BID) | ORAL | Status: DC | PRN

## 2016-07-17 MED ORDER — MEMANTINE HCL 10 MG PO TABS
10.0000 mg | ORAL_TABLET | Freq: Two times a day (BID) | ORAL | Status: DC
  Administered 2016-07-17 – 2016-07-19 (×4): 10 mg via ORAL
  Filled 2016-07-17 (×5): qty 1

## 2016-07-17 MED ORDER — TECHNETIUM TO 99M ALBUMIN AGGREGATED
4.0000 | Freq: Once | INTRAVENOUS | Status: AC | PRN
  Administered 2016-07-17: 4 via INTRAVENOUS

## 2016-07-17 MED ORDER — BISACODYL 5 MG PO TBEC: 5.0000 mg | DELAYED_RELEASE_TABLET | Freq: Every day | ORAL | Status: DC | PRN

## 2016-07-17 MED ORDER — SACCHAROMYCES BOULARDII 250 MG PO CAPS
250.0000 mg | ORAL_CAPSULE | Freq: Two times a day (BID) | ORAL | Status: DC
  Administered 2016-07-17 – 2016-07-19 (×3): 250 mg via ORAL
  Filled 2016-07-17 (×4): qty 1

## 2016-07-17 NOTE — Progress Notes (Signed)
Continuous heparin drip ordered for pt.  MD called to clarify order before CT of head is performed. MD confirmed that heparin drip should be initiated.  Deoni Cosey, RN

## 2016-07-17 NOTE — Progress Notes (Signed)
ANTICOAGULATION CONSULT NOTE - Follow Up Consult  Pharmacy Consult:  Heparin Indication:  Rule out VTE  Allergies  Allergen Reactions  . Penicillins Swelling    Facial and hand swelling per daughter Has patient had a PCN reaction causing immediate rash, facial/tongue/throat swelling, SOB or lightheadedness with hypotension: Yes Has patient had a PCN reaction causing severe rash involving mucus membranes or skin necrosis: No Has patient had a PCN reaction that required hospitalization No Has patient had a PCN reaction occurring within the last 10 years: No If all of the above answers are "NO", then may proceed with Cephalosporin use.  Marland Kitchen. Penicillins Swelling    Has patient had a PCN reaction causing immediate rash, facial/tongue/throat swelling, SOB or lightheadedness with hypotension: Yes Has patient had a PCN reaction causing severe rash involving mucus membranes or skin necrosis: No Has patient had a PCN reaction that required hospitalization No Has patient had a PCN reaction occurring within the last 10 years: No If all of the above answers are "NO", then may proceed with Cephalosporin use.     Patient Measurements: Height: 5\' 4"  (162.6 cm) Weight: 100 lb 9.6 oz (45.6 kg) IBW/kg (Calculated) : 54.7 Heparin Dosing Weight: 46 kg  Vital Signs: Temp: 98.7 F (37.1 C) (11/15 0611) Temp Source: Oral (11/15 0611) BP: 117/55 (11/15 0611) Pulse Rate: 110 (11/15 0611)  Labs:  Recent Labs  07/16/16 2214 07/17/16 0215 07/17/16 0656 07/17/16 0948  HGB 10.0* 10.1*  --   --   HCT 31.2* 31.7*  --   --   PLT 347 342  --   --   LABPROT 13.1  --   --   --   INR 0.99  --   --   --   HEPARINUNFRC  --   --   --  0.23*  CREATININE 1.36* 1.41*  --   --   TROPONINI  --  0.04* <0.03  --     Estimated Creatinine Clearance: 17.2 mL/min (by C-G formula based on SCr of 1.41 mg/dL (H)).     Assessment: 89 YOF to continue on IV heparin for possible VTE.  Heparin level sub-therapeutic;  no bleeding reported.   Goal of Therapy:  Heparin level 0.3-0.7 units/ml Monitor platelets by anticoagulation protocol: Yes    Plan:  - Increase heparin gtt to 700 units/hr - Check 8 hr heparin level - Daily heparin level and CBC - F/U with confirmation of PE and/or DVT   Martita Brumm D. Laney Potashang, PharmD, BCPS Pager:  [phone] 07/17/2016, 11:11 AM

## 2016-07-17 NOTE — ED Notes (Signed)
Pt is now refusing head CT and additional blood work, pt's daughter left earlier d/t being a diabetic and stated she needed to go home and take her medications.. Pt told Dr. Youlanda RoysNui that she did not want additional testing that if she was meant to die to "just let her go ahead and die." Dr. Youlanda RoysNui attempted pt's emergency contacts w/o success, unable to get pt's daughter on the phone to finalize plan of care.

## 2016-07-17 NOTE — ED Notes (Signed)
Unable to complete stroke swallow screen at this time, pt is tired and agitated when she is woken up.

## 2016-07-17 NOTE — H&P (Signed)
History and Physical    Ariel Douglas ZOX:[id number] DOB: 1919-08-12 DOA: 07/16/2016  Referring MD/NP/PA:   PCP: [name], MD   Patient coming from:  The patient is coming from rehabilitation facility.  At baseline, pt is dependent for most of ADL.  Chief Complaint: syncope  HPI: Ariel Douglas is a 82 y.o. female with medical history significant of dementia, TIA, CAD, chronic combined systolic and diastolic CHF, CKD-III, depression, hypertension, hyperlipidemia, recent left hip surgery, who presents with syncope.  Patient had left hip fracture surgery 6 weeks ago, and is currently doing rehabilitation. Per report, pt lost consciousness for a period of 1 to 5 minutes in facility. She moves all extremities. Denies chest pain, shortness of breath, nausea, vomiting, diarrhea, abdominal pain, seizure. No vision change or hearing loss. No slurred speech or facial droop. Denies symptoms of a UTI. Patient has asymmetric leg edema (L>R).   ED Course: pt was found to have positive D-dimer 3.23, WBC 22.1, negative urinalysis, stable renal function, temperature 99.3, tachycardia, tachypnea, oxygen saturation 90% on room air, blood pressure 107/58, chest x-ray showed vascular congestion without infiltration. CT-head was ordered, but pt refused it. Pt is placed on tele bed for obs.  Review of Systems:   General: no fevers, chills, no changes in body weight, has fatigue HEENT: no blurry vision, hearing changes or sore throat Respiratory: no dyspnea, coughing, wheezing CV: no chest pain, no palpitations GI: no nausea, vomiting, abdominal pain, diarrhea, constipation GU: no dysuria, burning on urination, increased urinary frequency, hematuria  Ext: has leg edema Neuro: no unilateral weakness, numbness, or tingling, no vision change or hearing loss. Has syncope. Skin: no rash, no skin tear. MSK: No muscle spasm, no deformity, no limitation of range of movement in spin Heme: No easy  bruising.  Travel history: No recent long distant travel.  Allergy:  Allergies  Allergen Reactions  . Penicillins Swelling    Facial and hand swelling per daughter Has patient had a PCN reaction causing immediate rash, facial/tongue/throat swelling, SOB or lightheadedness with hypotension: Yes Has patient had a PCN reaction causing severe rash involving mucus membranes or skin necrosis: No Has patient had a PCN reaction that required hospitalization No Has patient had a PCN reaction occurring within the last 10 years: No If all of the above answers are "NO", then may proceed with Cephalosporin use.  Marland Kitchen Penicillins Swelling    Has patient had a PCN reaction causing immediate rash, facial/tongue/throat swelling, SOB or lightheadedness with hypotension: Yes Has patient had a PCN reaction causing severe rash involving mucus membranes or skin necrosis: No Has patient had a PCN reaction that required hospitalization No Has patient had a PCN reaction occurring within the last 10 years: No If all of the above answers are "NO", then may proceed with Cephalosporin use.     Past Medical History:  Diagnosis Date  . AKI (acute kidney injury) (HCC)   . Closed left hip fracture (HCC)   . Coronary artery disease   . Dementia   . Depression with anxiety   . Fall   . HLD (hyperlipidemia)   . Hyperlipidemia   . Hypertension   . Hypokalemia   . Transient ischemic attack (TIA)     Past Surgical History:  Procedure Laterality Date  . ABDOMINAL HYSTERECTOMY    . ANTERIOR APPROACH HEMI HIP ARTHROPLASTY Left 05/25/2016   Procedure: ANTERIOR APPROACH HEMI HIP ARTHROPLASTY;  Surgeon: Samson Frederic, MD;  Location: [facility];  Service: Orthopedics;  Laterality: Left;  Social History:  reports that she has never smoked. She has never used smokeless tobacco. She reports that she does not drink alcohol or use drugs.  Family History:  Family History  Problem Relation Age of Onset  . Asthma Other   .  Heart disease Other      Prior to Admission medications   Medication Sig Start Date End Date Taking? Authorizing Provider  aspirin EC 81 MG EC tablet Take 1 tablet (81 mg total) by mouth 2 (two) times daily with a meal. 05/28/16  Yes Samson FredericBrian Swinteck, MD  atorvastatin (LIPITOR) 20 MG tablet Take 1 tablet (20 mg total) by mouth daily at 6 PM. 05/24/15  Yes Joseph ArtJessica U Vann, DO  bisacodyl (DULCOLAX) 5 MG EC tablet Take 1 tablet (5 mg total) by mouth daily as needed for moderate constipation. 05/28/16  Yes Calvert CantorSaima Rizwan, MD  carvedilol (COREG) 3.125 MG tablet Take 1 tablet (3.125 mg total) by mouth 2 (two) times daily with a meal. 05/28/16  Yes Calvert CantorSaima Rizwan, MD  feeding supplement, ENSURE ENLIVE, (ENSURE ENLIVE) LIQD Take 237 mLs by mouth 2 (two) times daily between meals. 05/28/16  Yes Calvert CantorSaima Rizwan, MD  ferrous sulfate 325 (65 FE) MG tablet Take 325 mg by mouth daily with breakfast.   Yes Historical Provider, MD  HYDROcodone-acetaminophen (NORCO/VICODIN) 5-325 MG tablet Take 1 tablet by mouth every 8 (eight) hours as needed for moderate pain or severe pain.   Yes Historical Provider, MD  LORazepam (ATIVAN) 1 MG tablet Take 1 tablet (1 mg total) by mouth 2 (two) times daily as needed for anxiety. 05/28/16  Yes Calvert CantorSaima Rizwan, MD  memantine (NAMENDA) 10 MG tablet Take 10 mg by mouth 2 (two) times daily.   Yes Historical Provider, MD  mirtazapine (REMERON) 15 MG tablet Take 15 mg by mouth at bedtime.   Yes Historical Provider, MD  Multiple Vitamin (MULTIVITAMIN) tablet Take 1 tablet by mouth daily.   Yes Historical Provider, MD  saccharomyces boulardii (FLORASTOR) 250 MG capsule Take 250 mg by mouth 2 (two) times daily.   Yes Historical Provider, MD  senna-docusate (SENOKOT-S) 8.6-50 MG tablet Take 1 tablet by mouth at bedtime. 05/28/16  Yes Calvert CantorSaima Rizwan, MD    Physical Exam: Vitals:   07/16/16 2315 07/17/16 0008 07/17/16 0030 07/17/16 0100  BP: 115/69  107/66 116/61  Pulse: 108  112 108  Resp: (!) 45   17    Temp:  99.3 F (37.4 C)    TempSrc:  Rectal    SpO2: 100%  99% 100%  Weight:      Height:       General: Not in acute distress HEENT:       Eyes: PERRL, EOMI, no scleral icterus.       ENT: No discharge from the ears and nose, no pharynx injection, no tonsillar enlargement.        Neck: No JVD, no bruit, no mass felt. Heme: No neck lymph node enlargement. Cardiac: S1/S2, RRR, No murmurs, No gallops or rubs. Respiratory: No rales, wheezing, rhonchi or rubs. GI: Soft, nondistended, nontender, no rebound pain, no organomegaly, BS present. GU: No hematuria Ext: Asymmetric leg edema (1+ left leg edema and trace right leg edema). 2+DP/PT pulse bilaterally. Musculoskeletal: No joint deformities, No joint redness or warmth, no limitation of ROM in spin. Skin: No rashes.  Neuro: Alert, oriented to place, but not to time and person, cranial nerves II-XII grossly intact, moves all extremities normally. Muscle strength 5/5 in all extremities,  sensation to light touch intact. Brachial reflex 2+ bilaterally. Negative Babinski's sign. Psych: Patient is not psychotic, no suicidal or hemocidal ideation.  Labs on Admission: I have personally reviewed following labs and imaging studies  CBC:  Recent Labs Lab 07/16/16 2214  WBC 22.1*  NEUTROABS 19.7*  HGB 10.0*  HCT 31.2*  MCV 81.9  PLT 347   Basic Metabolic Panel:  Recent Labs Lab 07/16/16 2214  NA 137  K 3.6  CL 106  CO2 21*  GLUCOSE 149*  BUN 21*  CREATININE 1.36*  CALCIUM 9.0   GFR: Estimated Creatinine Clearance: 17.7 mL/min (by C-G formula based on SCr of 1.36 mg/dL (H)). Liver Function Tests:  Recent Labs Lab 07/16/16 2214  AST 19  ALT 10*  ALKPHOS 72  BILITOT 0.9  PROT 6.2*  ALBUMIN 2.8*   No results for input(s): LIPASE, AMYLASE in the last 168 hours. No results for input(s): AMMONIA in the last 168 hours. Coagulation Profile:  Recent Labs Lab 07/16/16 2214  INR 0.99   Cardiac Enzymes: No results for  input(s): CKTOTAL, CKMB, CKMBINDEX, TROPONINI in the last 168 hours. BNP (last 3 results) No results for input(s): PROBNP in the last 8760 hours. HbA1C: No results for input(s): HGBA1C in the last 72 hours. CBG:  Recent Labs Lab 07/16/16 2024  GLUCAP 137*   Lipid Profile: No results for input(s): CHOL, HDL, LDLCALC, TRIG, CHOLHDL, LDLDIRECT in the last 72 hours. Thyroid Function Tests: No results for input(s): TSH, T4TOTAL, FREET4, T3FREE, THYROIDAB in the last 72 hours. Anemia Panel: No results for input(s): VITAMINB12, FOLATE, FERRITIN, TIBC, IRON, RETICCTPCT in the last 72 hours. Urine analysis:    Component Value Date/Time   COLORURINE YELLOW 07/17/2016 0008   APPEARANCEUR CLEAR 07/17/2016 0008   LABSPEC 1.007 07/17/2016 0008   PHURINE 5.5 07/17/2016 0008   GLUCOSEU NEGATIVE 07/17/2016 0008   HGBUR SMALL (A) 07/17/2016 0008   BILIRUBINUR NEGATIVE 07/17/2016 0008   KETONESUR NEGATIVE 07/17/2016 0008   PROTEINUR NEGATIVE 07/17/2016 0008   UROBILINOGEN 1.0 05/24/2013 0100   NITRITE NEGATIVE 07/17/2016 0008   LEUKOCYTESUR NEGATIVE 07/17/2016 0008   Sepsis Labs: @LABRCNTIP (procalcitonin:4,lacticidven:4) )No results found for this or any previous visit (from the past 240 hour(s)).   Radiological Exams on Admission: Dg Chest Portable 1 View  Result Date: 07/16/2016 CLINICAL DATA:  Leukocytosis, transient confusion and dementia. EXAM: PORTABLE CHEST 1 VIEW COMPARISON:  05/25/2016 FINDINGS: There is stable cardiomegaly with aortic atherosclerosis. Lung volumes are slightly low with crowding of interstitial lung markings and mild vascular congestion. No pneumonic consolidation, effusion or pneumothorax. Osteoarthritis of the glenohumeral joints bilaterally. IMPRESSION: Stable cardiomegaly with aortic atherosclerosis. Mild pulmonary vascular congestion without pneumonic consolidations. No acute osseous abnormality. Electronically Signed   By: Tollie Eth M.D.   On: 07/16/2016 23:30      EKG: Independently reviewed.  Sinus rhythm, QTC 469, tachycardia, PAC, PVC, nonspecific T-wave change   Assessment/Plan Principal Problem:   Syncope Active Problems:   CVA (cerebral vascular accident) (HCC)   Chronic kidney disease, stage 3   Dementia   Hypertension   Hyperlipidemia   Depression with anxiety   Chronic combined systolic and diastolic CHF (congestive heart failure) (HCC)   Leukocytosis   Syncope: Etiology is not clear. Potential differential diagnosis is PE given her recent left hip surgery, tachycardia, asymmetric leg edema and elevated d-dimer. Other differential diagnoses include TIA/stroke, cardiac arrhythmia and vasovagal syncope. CT-head was ordered, but pt refused. I also talked to pt for getting MRI of  brain, she strongly refused it. Ideally, I likely to see negative CT-head before starting IV heparin, but pt refused it. Since pt is not on blood thinner and did not have fall, she should not have high risk of intracranial bleeding, therefore will start IV heparin for possible PE. I could not reach her family for further discussion by phone.  - will place on tele bed - start IV heparin - will get V/Q scan in AM - pt refused CT-head and MRI brain. - Orthostatic vital signs  - Trending troponins x3  - 2d echo - Neuro checks  - On ASA - PT/OT eval and treat - LE venous doppler to r/o DVT   Hx of CVA -continue ASA and lipitor  CKD-III: stable. Based anchored it 1.2-1.4. Her creatinine is 1.36, BUN 21. -Follow-up renal function by BMP  Dementia: -continue Namenda  Depression and anxiety: -Continue home medications: Remeron, Ativan  HTN: Blood pressure 107/58 -continue Coreg and  HLD: Last LDL was 73 on 06/28/16 -Continue home medications: Lipitor  Chronic combined systolic and diastolic CHF (congestive heart failure) (HCC): 2-D echo on 05/24/16 showed EF of 45-50% with grade 2 diastolic dysfunction. Patient is not taking diuretics at home.  Patient does not have shortness of breath. No JVD. CHF seems to be compensated. -continue aspirin, Coreg -Check BNP  Leukocytosis: WBC 22.1. No source of infection identified. Urinalysis negative, chest x-ray negative for infiltration. Temperature 95.3. Likely due to stress induced to demargination -follow up by CBC -hold of Abx now -will get blood culture if develops fever   DVT ppx: On IV Heparin   Code Status: pt has dementia, dose not fully understand question regarding CODE STATUS, will temporarily put the patient as full code now. I could not reach her family by phone Family Communication: None at bed side.  Disposition Plan:  Anticipate discharge back to previous home environment Consults called:  none Admission status: Obs / tele     Date of Service 07/17/2016    Lorretta HarpIU, Tonianne Fine [facility] Pager [phone]  If 7PM-7AM, please contact night-coverage [email or web address] Password [email or web address] 07/17/2016, 1:28 AM

## 2016-07-17 NOTE — Progress Notes (Signed)
ANTICOAGULATION CONSULT NOTE - Follow Up Consult  Pharmacy Consult:  Heparin Indication:  Rule out VTE    Patient Measurements: Height: 5\' 4"  (162.6 cm) Weight: 100 lb 9.6 oz (45.6 kg) IBW/kg (Calculated) : 54.7 Heparin Dosing Weight: 46 kg  Vital Signs: Temp: 98.6 F (37 C) (11/15 1946) Temp Source: Oral (11/15 1946) BP: 118/53 (11/15 1946) Pulse Rate: 100 (11/15 1946)  Labs:  Recent Labs  07/16/16 2214 07/17/16 0215 07/17/16 0656 07/17/16 0948 07/17/16 1224 07/17/16 2123  HGB 10.0* 10.1*  --   --   --   --   HCT 31.2* 31.7*  --   --   --   --   PLT 347 342  --   --   --   --   LABPROT 13.1  --   --   --   --   --   INR 0.99  --   --   --   --   --   HEPARINUNFRC  --   --   --  0.23*  --  0.16*  CREATININE 1.36* 1.41*  --   --   --   --   TROPONINI  --  0.04* <0.03  --  <0.03  --     Estimated Creatinine Clearance: 17.2 mL/min (by C-G formula based on SCr of 1.41 mg/dL (H)).     Assessment:  Anticoag: Heparin for r/o PE/DVT.  D-dimer 3.23, VQ negative for PE, await Doppler - HL 0.23>0.16 down   Goal of Therapy:  Heparin level 0.3-0.7 units/ml Monitor platelets by anticoagulation protocol: Yes    Plan:  - Small heparin bolus 1350 units/hr - Increase heparin gtt to 850 units/hr   Maryhelen Lindler S. Merilynn Finlandobertson, PharmD, BCPS Clinical Staff Pharmacist Pager [phone]  07/17/2016, 10:31 PM

## 2016-07-17 NOTE — ED Notes (Signed)
Dr.Nui at bedside. 

## 2016-07-17 NOTE — Progress Notes (Signed)
ANTICOAGULATION CONSULT NOTE - Initial Consult  Pharmacy Consult for Heparin Indication: rule out PE/DVT  Allergies  Allergen Reactions  . Penicillins Swelling    Facial and hand swelling per daughter Has patient had a PCN reaction causing immediate rash, facial/tongue/throat swelling, SOB or lightheadedness with hypotension: Yes Has patient had a PCN reaction causing severe rash involving mucus membranes or skin necrosis: No Has patient had a PCN reaction that required hospitalization No Has patient had a PCN reaction occurring within the last 10 years: No If all of the above answers are "NO", then may proceed with Cephalosporin use.  Marland Kitchen. Penicillins Swelling    Has patient had a PCN reaction causing immediate rash, facial/tongue/throat swelling, SOB or lightheadedness with hypotension: Yes Has patient had a PCN reaction causing severe rash involving mucus membranes or skin necrosis: No Has patient had a PCN reaction that required hospitalization No Has patient had a PCN reaction occurring within the last 10 years: No If all of the above answers are "NO", then may proceed with Cephalosporin use.     Patient Measurements: Height: 5\' 4"  (162.6 cm) Weight: 100 lb (45.4 kg) IBW/kg (Calculated) : 54.7  Vital Signs: Temp: 99.3 F (37.4 C) (11/15 0008) Temp Source: Rectal (11/15 0008) BP: 116/61 (11/15 0100) Pulse Rate: 108 (11/15 0100)  Labs:  Recent Labs  07/16/16 2214  HGB 10.0*  HCT 31.2*  PLT 347  LABPROT 13.1  INR 0.99  CREATININE 1.36*    Estimated Creatinine Clearance: 17.7 mL/min (by C-G formula based on SCr of 1.36 mg/dL (H)).   Medical History: Past Medical History:  Diagnosis Date  . AKI (acute kidney injury) (HCC)   . Closed left hip fracture (HCC)   . Coronary artery disease   . Dementia   . Depression with anxiety   . Fall   . HLD (hyperlipidemia)   . Hyperlipidemia   . Hypertension   . Hypokalemia   . Transient ischemic attack (TIA)      Medications:  No current facility-administered medications on file prior to encounter.    Current Outpatient Prescriptions on File Prior to Encounter  Medication Sig Dispense Refill  . aspirin EC 81 MG EC tablet Take 1 tablet (81 mg total) by mouth 2 (two) times daily with a meal. 60 tablet 1  . atorvastatin (LIPITOR) 20 MG tablet Take 1 tablet (20 mg total) by mouth daily at 6 PM.    . bisacodyl (DULCOLAX) 5 MG EC tablet Take 1 tablet (5 mg total) by mouth daily as needed for moderate constipation. 30 tablet 0  . carvedilol (COREG) 3.125 MG tablet Take 1 tablet (3.125 mg total) by mouth 2 (two) times daily with a meal.    . feeding supplement, ENSURE ENLIVE, (ENSURE ENLIVE) LIQD Take 237 mLs by mouth 2 (two) times daily between meals. 237 mL 12  . HYDROcodone-acetaminophen (NORCO/VICODIN) 5-325 MG tablet Take 1 tablet by mouth every 8 (eight) hours as needed for moderate pain or severe pain.    Marland Kitchen. LORazepam (ATIVAN) 1 MG tablet Take 1 tablet (1 mg total) by mouth 2 (two) times daily as needed for anxiety. 30 tablet 0  . memantine (NAMENDA) 10 MG tablet Take 10 mg by mouth 2 (two) times daily.    . mirtazapine (REMERON) 15 MG tablet Take 15 mg by mouth at bedtime.    . Multiple Vitamin (MULTIVITAMIN) tablet Take 1 tablet by mouth daily.    Marland Kitchen. senna-docusate (SENOKOT-S) 8.6-50 MG tablet Take 1 tablet by mouth  at bedtime.       Assessment: 82 y.o. female with syncope, elevated D-dimer, tachypnea, and LLE edema, possible VTE, for heparin  Goal of Therapy:  Heparin level 0.3-0.7 units/ml Monitor platelets by anticoagulation protocol: Yes   Plan:  Heparin 2000 units IV bolus, then start heparin 600 units/hr Check heparin level in 8 hours.     Suzzanne Brunkhorst, Gary FleetGregory Vernon 07/17/2016,1:36 AM

## 2016-07-17 NOTE — Progress Notes (Signed)
PT Cancellation Note  Patient Details Name: Lauralyn PrimesLillie Jannice Beitzel-Roberts MRN: [id number] DOB: July 14, 1919   Cancelled Treatment:    Reason Eval/Treat Not Completed: Patient at procedure or test/unavailable.Pt at CT and then chest xray to r/oPE.  Will check back as schedule permits.   Nyaira Hodgens LUBECK 07/17/2016, 11:04 AM

## 2016-07-17 NOTE — Progress Notes (Signed)
I have seen and assessed patient and agree with Dr.Niu's assessment and plan. Patient presented with a syncopal episode. Patient initially refused head CT however CT head was done this morning which was negative. VQ scan with low probability for PE. Lower extremity Dopplers pending. Orthostatics seems to be positive by pulse however patient asymptomatic. Will hydrate gently with IV fluids over the next 24 hours. Patient with no focal neurological deficits. Check carotids. Place on a soft diet. Follow.

## 2016-07-17 NOTE — Progress Notes (Signed)
Initial Nutrition Assessment  DOCUMENTATION CODES:   Non-severe (moderate) malnutrition in context of chronic illness, Underweight  INTERVENTION:  Diet advancement per MD Provide Ensure Enlive po BID when diet is advanced, each supplement provides 350 kcal and 20 grams of protein Provide Magic cup BID with meals, each supplement provides 290 kcal and 9 grams of protein   NUTRITION DIAGNOSIS:   Increased nutrient needs related to wound healing as evidenced by estimated needs.   GOAL:   Patient will meet greater than or equal to 90% of their needs   MONITOR:   Diet advancement, Supplement acceptance, PO intake, Skin, I & O's, Labs, Weight trends  REASON FOR ASSESSMENT:   Low Braden    ASSESSMENT:   [age] y.o. female with medical history significant of dementia, TIA, CAD, chronic combined systolic and diastolic CHF, CKD-III, depression, hypertension, hyperlipidemia, recent left hip surgery, who presents with syncope.  Pt very confused at time of visit, but very alert. She states that she is hungry and is asking why she hasn't been allowed to eat. She states that she has lost weight and that she used to weigh 120 lbs. She likes Ensure. Pt allowed dietitian to perform nutrition-focused physical exam- moderate fat wasting and severe muscle wasting were noted.   Labs reviewed.   Diet Order:  DIET SOFT Room service appropriate? Yes; Fluid consistency: Thin  Skin:  Wound (see comment) (Stage II pressure injury on sacrum)  Last BM:  11/15  Height:   Ht Readings from Last 1 Encounters:  07/17/16 5\' 4"  (1.626 m)    Weight:   Wt Readings from Last 1 Encounters:  07/17/16 100 lb 9.6 oz (45.6 kg)    Ideal Body Weight:  54.5 kg  BMI:  Body mass index is 17.27 kg/m.  Estimated Nutritional Needs:   Kcal:  1300-1500  Protein:  60-70 grams  Fluid:  1.3-1.5 L/day  EDUCATION NEEDS:   No education needs identified at this time  Dorothea Ogleeanne Shoaib Siefker RD, CSP, LDN Inpatient  Clinical Dietitian Pager: [phone] After Hours Pager: [phone]

## 2016-07-17 NOTE — Progress Notes (Signed)
Pt agitated and refusing plan of care/interventions (except lab draws) while in ED and upon arrival to this unit while daughter is not present. RN also unable to obtain admission history as the patient is not a good historian. Will pend admission history until daughter is present.

## 2016-07-17 NOTE — Progress Notes (Signed)
OT Cancellation Note  Patient Details Name: Ariel Douglas MRN: [id number] DOB: 08/14/1919   Cancelled Treatment:    Reason Eval/Treat Not Completed: Patient at procedure or test/ unavailable. Pt at CT and then chest xray to r/oPE. Will follow.  Evern BioMayberry, Judene Logue Lynn 07/17/2016, 11:01 AM  [phone]

## 2016-07-18 ENCOUNTER — Observation Stay (HOSPITAL_BASED_OUTPATIENT_CLINIC_OR_DEPARTMENT_OTHER): Payer: Medicare Other

## 2016-07-18 ENCOUNTER — Observation Stay (HOSPITAL_COMMUNITY): Payer: Medicare Other

## 2016-07-18 DIAGNOSIS — R55 Syncope and collapse: Secondary | ICD-10-CM

## 2016-07-18 DIAGNOSIS — I5042 Chronic combined systolic (congestive) and diastolic (congestive) heart failure: Secondary | ICD-10-CM | POA: Diagnosis not present

## 2016-07-18 DIAGNOSIS — E440 Moderate protein-calorie malnutrition: Secondary | ICD-10-CM

## 2016-07-18 DIAGNOSIS — G301 Alzheimer's disease with late onset: Secondary | ICD-10-CM | POA: Diagnosis not present

## 2016-07-18 DIAGNOSIS — N183 Chronic kidney disease, stage 3 (moderate): Secondary | ICD-10-CM | POA: Diagnosis not present

## 2016-07-18 DIAGNOSIS — D72829 Elevated white blood cell count, unspecified: Secondary | ICD-10-CM

## 2016-07-18 LAB — CBC
HCT: 25.1 % — ABNORMAL LOW (ref 36.0–46.0)
Hemoglobin: 8.1 g/dL — ABNORMAL LOW (ref 12.0–15.0)
MCH: 26.6 pg (ref 26.0–34.0)
MCHC: 32.3 g/dL (ref 30.0–36.0)
MCV: 82.3 fL (ref 78.0–100.0)
PLATELETS: 278 10*3/uL (ref 150–400)
RBC: 3.05 MIL/uL — AB (ref 3.87–5.11)
RDW: 16.4 % — ABNORMAL HIGH (ref 11.5–15.5)
WBC: 9.6 10*3/uL (ref 4.0–10.5)

## 2016-07-18 LAB — GLUCOSE, CAPILLARY: GLUCOSE-CAPILLARY: 86 mg/dL (ref 65–99)

## 2016-07-18 LAB — BASIC METABOLIC PANEL
Anion gap: 6 (ref 5–15)
BUN: 16 mg/dL (ref 6–20)
CHLORIDE: 115 mmol/L — AB (ref 101–111)
CO2: 22 mmol/L (ref 22–32)
CREATININE: 1.31 mg/dL — AB (ref 0.44–1.00)
Calcium: 8.5 mg/dL — ABNORMAL LOW (ref 8.9–10.3)
GFR calc Af Amer: 39 mL/min — ABNORMAL LOW (ref >60)
GFR, EST NON AFRICAN AMERICAN: 33 mL/min — AB (ref >60)
GLUCOSE: 91 mg/dL (ref 65–99)
POTASSIUM: 3.1 mmol/L — AB (ref 3.5–5.1)
SODIUM: 143 mmol/L (ref 135–145)

## 2016-07-18 LAB — CBC AND DIFFERENTIAL: WBC: 9.6 10*3/mL

## 2016-07-18 LAB — HEPARIN LEVEL (UNFRACTIONATED): Heparin Unfractionated: 0.51 IU/mL (ref 0.30–0.70)

## 2016-07-18 LAB — MAGNESIUM: MAGNESIUM: 1.8 mg/dL (ref 1.7–2.4)

## 2016-07-18 MED ORDER — HEPARIN SODIUM (PORCINE) 5000 UNIT/ML IJ SOLN
5000.0000 [IU] | Freq: Three times a day (TID) | INTRAMUSCULAR | Status: DC
  Administered 2016-07-18 – 2016-07-19 (×4): 5000 [IU] via SUBCUTANEOUS
  Filled 2016-07-18 (×3): qty 1

## 2016-07-18 MED ORDER — POTASSIUM CHLORIDE CRYS ER 20 MEQ PO TBCR
40.0000 meq | EXTENDED_RELEASE_TABLET | ORAL | Status: AC
  Administered 2016-07-18 (×2): 40 meq via ORAL
  Filled 2016-07-18 (×2): qty 2

## 2016-07-18 NOTE — Evaluation (Signed)
Physical Therapy Evaluation Patient Details Name: Ariel PrimesLillie Smith-Roberts MRN: [id number] DOB: 1918/12/27 Today's Date: 07/18/2016   History of Present Illness  Pt admitted after syncopal episode at her SNF. Negative for stroke, PE/DVT. PMH: dementia, TIA, L hip fx in September, CAD, CHF, CKD, depression, HTN  Clinical Impression  Pt presented sitting EOB with NT in room when PT entered. Pt not a reliable historian secondary to cognitive deficits at baseline. Pt required min A with sit-to-stand transfers and min guard for ambulation with RW. Pt would continue to benefit from skilled physical therapy services at this time while admitted and after d/c to address her below listed limitations in order to improve her overall safety and independence with functional mobility.     Follow Up Recommendations SNF    Equipment Recommendations  None recommended by PT    Recommendations for Other Services       Precautions / Restrictions Precautions Precautions: Fall Restrictions Weight Bearing Restrictions: No      Mobility  Bed Mobility               General bed mobility comments: pt sitting at EOB with NT upon arrival  Transfers Overall transfer level: Needs assistance Equipment used: Rolling walker (2 wheeled) Transfers: Sit to/from Stand Sit to Stand: Min assist         General transfer comment: pt required increased time, VC'ing for bilateral hand placement and min A to achieve full standing x2 from bed and x1 from recliner  Ambulation/Gait Ambulation/Gait assistance: Min guard Ambulation Distance (Feet): 4 Feet Assistive device: Rolling walker (2 wheeled) Gait Pattern/deviations: Step-through pattern;Decreased stride length;Trunk flexed Gait velocity: decreased   General Gait Details: min guard for safety  Stairs            Wheelchair Mobility    Modified Rankin (Stroke Patients Only)       Balance Overall balance assessment: Needs  assistance Sitting-balance support: Feet supported Sitting balance-Leahy Scale: Fair     Standing balance support: During functional activity;Bilateral upper extremity supported Standing balance-Leahy Scale: Poor Standing balance comment: pt reliant on bilateral UEs on RW                             Pertinent Vitals/Pain Pain Assessment: Faces Faces Pain Scale: No hurt    Home Living Family/patient expects to be discharged to:: Skilled nursing facility                      Prior Function           Comments: pt not a good historian, has lived at [facility] since her hip fx 6 weeks ago.     Hand Dominance   Dominant Hand: Right    Extremity/Trunk Assessment   Upper Extremity Assessment: Defer to OT evaluation           Lower Extremity Assessment: Generalized weakness;Difficult to assess due to impaired cognition         Communication   Communication: HOH  Cognition Arousal/Alertness: Awake/alert Behavior During Therapy: WFL for tasks assessed/performed Overall Cognitive Status: History of cognitive impairments - at baseline       Memory: Decreased short-term memory              General Comments      Exercises     Assessment/Plan    PT Assessment Patient needs continued PT services  PT Problem List Decreased strength;Decreased activity  tolerance;Decreased balance;Decreased mobility;Decreased coordination;Decreased cognition;Decreased knowledge of use of DME;Decreased safety awareness          PT Treatment Interventions DME instruction;Gait training;Functional mobility training;Therapeutic activities;Therapeutic exercise;Balance training;Neuromuscular re-education;Patient/family education    PT Goals (Current goals can be found in the Care Plan section)  Acute Rehab PT Goals Patient Stated Goal: unable to state PT Goal Formulation: With patient Time For Goal Achievement: 08/01/16 Potential to Achieve Goals: Fair     Frequency Min 2X/week   Barriers to discharge        Co-evaluation PT/OT/SLP Co-Evaluation/Treatment: Yes Reason for Co-Treatment: Necessary to address cognition/behavior during functional activity;Other (comment) (pt was not likely to tolerate both therapies) PT goals addressed during session: Mobility/safety with mobility;Balance;Proper use of DME;Strengthening/ROM OT goals addressed during session: ADL's and self-care       End of Session Equipment Utilized During Treatment: Gait belt Activity Tolerance: Patient tolerated treatment well Patient left: in chair;with call bell/phone within reach;with chair alarm set Nurse Communication: Mobility status    Functional Assessment Tool Used: clinical judgement Functional Limitation: Mobility: Walking and moving around Mobility: Walking and Moving Around Current Status ([id number](G8978): At least 1 percent but less than 20 percent impaired, limited or restricted Mobility: Walking and Moving Around Goal Status [phone]([phone]): 0 percent impaired, limited or restricted    Time: 1430-1451 PT Time Calculation (min) (ACUTE ONLY): 21 min   Charges:   PT Evaluation $PT Eval Moderate Complexity: 1 Procedure     PT G Codes:   PT G-Codes **NOT FOR INPATIENT CLASS** Functional Assessment Tool Used: clinical judgement Functional Limitation: Mobility: Walking and moving around Mobility: Walking and Moving Around Current Status ([id number](G8978): At least 1 percent but less than 20 percent impaired, limited or restricted Mobility: Walking and Moving Around Goal Status [phone]([phone]): 0 percent impaired, limited or restricted    [facility]Jennifer M Wardell Pokorski 07/18/2016, 4:03 PM Deborah ChalkJennifer Cara Aguino, PT, DPT [phone]

## 2016-07-18 NOTE — Progress Notes (Signed)
VASCULAR LAB PRELIMINARY  PRELIMINARY  PRELIMINARY  PRELIMINARY  Bilateral lower extremity venous duplex completed.    Preliminary report:  There is no DVT or SVT noted in the bilateral lower extremities.   Hemi Chacko, RVT 07/18/2016, 11:24 AM

## 2016-07-18 NOTE — Progress Notes (Signed)
K+ = 3.1. MD paged. Will continue to monitor.

## 2016-07-18 NOTE — Progress Notes (Signed)
ANTICOAGULATION CONSULT NOTE - Follow Up Consult  Pharmacy Consult:  Heparin Indication:  Rule out VTE  Allergies  Allergen Reactions  . Penicillins Swelling    Facial and hand swelling per daughter Has patient had a PCN reaction causing immediate rash, facial/tongue/throat swelling, SOB or lightheadedness with hypotension: Yes Has patient had a PCN reaction causing severe rash involving mucus membranes or skin necrosis: No Has patient had a PCN reaction that required hospitalization No Has patient had a PCN reaction occurring within the last 10 years: No If all of the above answers are "NO", then may proceed with Cephalosporin use.  Marland Kitchen. Penicillins Swelling    Has patient had a PCN reaction causing immediate rash, facial/tongue/throat swelling, SOB or lightheadedness with hypotension: Yes Has patient had a PCN reaction causing severe rash involving mucus membranes or skin necrosis: No Has patient had a PCN reaction that required hospitalization No Has patient had a PCN reaction occurring within the last 10 years: No If all of the above answers are "NO", then may proceed with Cephalosporin use.     Patient Measurements: Height: 5\' 4"  (162.6 cm) Weight: 102 lb 14.4 oz (46.7 kg) IBW/kg (Calculated) : 54.7 Heparin Dosing Weight: 46 kg  Vital Signs: Temp: 98.4 F (36.9 C) (11/16 0551) Temp Source: Oral (11/16 0551) BP: 135/69 (11/16 0551) Pulse Rate: 100 (11/16 0551)  Labs:  Recent Labs  07/16/16 2214 07/17/16 0215 07/17/16 0656 07/17/16 0948 07/17/16 1224 07/17/16 2123 07/18/16 0420  HGB 10.0* 10.1*  --   --   --   --  8.1*  HCT 31.2* 31.7*  --   --   --   --  25.1*  PLT 347 342  --   --   --   --  278  LABPROT 13.1  --   --   --   --   --   --   INR 0.99  --   --   --   --   --   --   HEPARINUNFRC  --   --   --  0.23*  --  0.16* 0.51  CREATININE 1.36* 1.41*  --   --   --   --  1.31*  TROPONINI  --  0.04* <0.03  --  <0.03  --   --     Estimated Creatinine  Clearance: 18.9 mL/min (by C-G formula based on SCr of 1.31 mg/dL (H)).     Assessment: 89 YOF to continue on IV heparin for possible VTE.  Heparin level therapeutic; no bleeding reported.   Goal of Therapy:  Heparin level 0.3-0.7 units/ml Monitor platelets by anticoagulation protocol: Yes    Plan:  - Continue heparin gtt at 850 units/hr - Daily heparin level and CBC - F/U with confirmation of DVT, KCL supplementation   Wilfrid Hyser D. Laney Potashang, PharmD, BCPS Pager:  [phone] 07/18/2016, 8:33 AM

## 2016-07-18 NOTE — Progress Notes (Signed)
PROGRESS NOTE    Ariel Douglas  NFA:[id number] DOB: [birth year] DOA: 07/16/2016 PCP: [name], MD    Brief Narrative:   Ariel Douglas is a 82 y.o. female with medical history significant of dementia, TIA, CAD, chronic combined systolic and diastolic CHF, CKD-III, depression, hypertension, hyperlipidemia, recent left hip surgery, who presented with syncope.  Patient had left hip fracture surgery 6 weeks ago, and was doing rehabilitation. Per report, pt lost consciousness for a period of 1 to 5 minutes in facility. She moves all extremities. Denied chest pain, shortness of breath, nausea, vomiting, diarrhea, abdominal pain, seizure. No vision change or hearing loss. No slurred speech or facial droop. Denied symptoms of a UTI. Patient has asymmetric leg edema (L>R).   ED Course: pt was found to have positive D-dimer 3.23, WBC 22.1, negative urinalysis, stable renal function, temperature 99.3, tachycardia, tachypnea, oxygen saturation 90% on room air, blood pressure 107/58, chest x-ray showed vascular congestion without infiltration. CT-head was ordered, but pt refused it. Pt was placed on tele bed for obs.   Assessment & Plan:   Principal Problem:   Syncope Active Problems:   CVA (cerebral vascular accident) (HCC)   Chronic kidney disease, stage 3   Dementia   Hypertension   Hyperlipidemia   Depression with anxiety   Chronic combined systolic and diastolic CHF (congestive heart failure) (HCC)   Leukocytosis   Pressure injury of skin   Malnutrition of moderate degree  #1 syncope ?? Etiology. Patient with no further syncopal episodes during this hospitalization. Carotid Dopplers with no significant ICA stenosis. Lower extremity Dopplers negative for DVT. VQ scan was very low probability for PE. No arrhythmias noted on telemetry. 2-D echo pending. Patient was noted to be somewhat orthostatic by pulse however asymptomatic. Patient with no seizure-like activity. Continue  gentle hydration. PT/OT. Follow.  #2 chronic kidney disease stage III Stable.  #3 chronic combined systolic and diastolic heart failure Stable. Sodium lock IV fluids.  #4 dementia Stable. Continue Namenda, Remeron.  #5 hyperlipidemia Continue statin.  #6 depression/anxiety Stable.  #7 CVA Continue aspirin for secondary stroke prevention. PT/OT.  #8 leukocytosis Likely stressed emargination. No signs of infection. Resolved.   DVT prophylaxis: Heparin Code Status: Full Family Communication: Updated patient. No family at bedside. Disposition Plan: Back to SNF when workup completed.   Consultants:   None  Antimicrobials:   None  Procedures:   Lower extremity Dopplers 07/18/2016  Carotid Dopplers 07/18/2016  2-D echo pending  CT head 07/17/2016  VQ scan 07/17/2016   Subjective: Patient denies any chest pain. No shortness of breath. Patient with no further syncopal episodes.  Objective: Vitals:   07/17/16 1611 07/17/16 1946 07/18/16 0002 07/18/16 0551  BP:  (!) 118/53 130/73 135/69  Pulse:  100 (!) 103 100  Resp:  18 20 18   Temp: 97.9 F (36.6 C) 98.6 F (37 C) 99 F (37.2 C) 98.4 F (36.9 C)  TempSrc:  Oral Oral Oral  SpO2:  98% 99% 99%  Weight:    46.7 kg (102 lb 14.4 oz)  Height:        Intake/Output Summary (Last 24 hours) at 07/18/16 1245 Last data filed at 07/18/16 0200  Gross per 24 hour  Intake          1316.66 ml  Output                1 ml  Net          1315.66 ml   [employer]  07/16/16 2019 07/17/16 0208 07/18/16 0551  Weight: 45.4 kg (100 lb) 45.6 kg (100 lb 9.6 oz) 46.7 kg (102 lb 14.4 oz)    Examination:  General exam: Appears calm and comfortable  Respiratory system: Clear to auscultation. Respiratory effort normal. Cardiovascular system: S1 & S2 heard, RRR. No JVD, murmurs, rubs, gallops or clicks. No pedal edema. Gastrointestinal system: Abdomen is nondistended, soft and nontender. No organomegaly or masses felt.  Normal bowel sounds heard. Central nervous system: Alert and oriented. No focal neurological deficits. Extremities: Symmetric 5 x 5 power. Skin: No rashes, lesions or ulcers Psychiatry: Judgement and insight appear normal. Mood & affect appropriate.     Data Reviewed: I have personally reviewed following labs and imaging studies  CBC:  Recent Labs Lab 07/16/16 2214 07/17/16 0215 07/18/16 0420  WBC 22.1* 20.2* 9.6  NEUTROABS 19.7*  --   --   HGB 10.0* 10.1* 8.1*  HCT 31.2* 31.7* 25.1*  MCV 81.9 82.6 82.3  PLT 347 342 278   Basic Metabolic Panel:  Recent Labs Lab 07/16/16 2214 07/17/16 0215 07/18/16 0420 07/18/16 0430  NA 137 140 143  --   K 3.6 3.9 3.1*  --   CL 106 108 115*  --   CO2 21* 23 22  --   GLUCOSE 149* 136* 91  --   BUN 21* 18 16  --   CREATININE 1.36* 1.41* 1.31*  --   CALCIUM 9.0 9.1 8.5*  --   MG  --   --   --  1.8   GFR: Estimated Creatinine Clearance: 18.9 mL/min (by C-G formula based on SCr of 1.31 mg/dL (H)). Liver Function Tests:  Recent Labs Lab 07/16/16 2214  AST 19  ALT 10*  ALKPHOS 72  BILITOT 0.9  PROT 6.2*  ALBUMIN 2.8*   No results for input(s): LIPASE, AMYLASE in the last 168 hours. No results for input(s): AMMONIA in the last 168 hours. Coagulation Profile:  Recent Labs Lab 07/16/16 2214  INR 0.99   Cardiac Enzymes:  Recent Labs Lab 07/17/16 0215 07/17/16 0656 07/17/16 1224  TROPONINI 0.04* <0.03 <0.03   BNP (last 3 results) No results for input(s): PROBNP in the last 8760 hours. HbA1C: No results for input(s): HGBA1C in the last 72 hours. CBG:  Recent Labs Lab 07/16/16 2024 07/17/16 0715 07/17/16 1236 07/18/16 0646  GLUCAP 137* 108* 91 86   Lipid Profile: No results for input(s): CHOL, HDL, LDLCALC, TRIG, CHOLHDL, LDLDIRECT in the last 72 hours. Thyroid Function Tests: No results for input(s): TSH, T4TOTAL, FREET4, T3FREE, THYROIDAB in the last 72 hours. Anemia Panel: No results for input(s):  VITAMINB12, FOLATE, FERRITIN, TIBC, IRON, RETICCTPCT in the last 72 hours. Sepsis Labs:  Recent Labs Lab 07/17/16 0215  LATICACIDVEN 1.6    Recent Results (from the past 240 hour(s))  MRSA PCR Screening     Status: None   Collection Time: 07/17/16  2:18 AM  Result Value Ref Range Status   MRSA by PCR NEGATIVE NEGATIVE Final    Comment:        The GeneXpert MRSA Assay (FDA approved for NASAL specimens only), is one component of a comprehensive MRSA colonization surveillance program. It is not intended to diagnose MRSA infection nor to guide or monitor treatment for MRSA infections.          Radiology Studies: Ct Head Wo Contrast  Result Date: 07/17/2016 CLINICAL DATA:  Syncope EXAM: CT HEAD WITHOUT CONTRAST TECHNIQUE: Contiguous axial images were obtained from the  base of the skull through the vertex without intravenous contrast. COMPARISON:  CT head 05/24/2016 FINDINGS: Brain: Moderate to advanced atrophy. Chronic microvascular ischemic change in the white matter. Chronic infarct right occipital lobe unchanged. Chronic infarct left frontal operculum and insula. Negative for acute infarct. Negative for hemorrhage. Calcific masses in the right frontal region and left temporal region adjacent to the inner table compatible with calcified meningioma unchanged from the prior study. Vascular: Arterial calcification.  Negative for dense MCA Skull: Negative Sinuses/Orbits: Negative Other: None IMPRESSION: Moderate to advanced atrophy. Extensive chronic ischemic change. No acute abnormality. Electronically Signed   By: Marlan Palauharles  Clark M.D.   On: 07/17/2016 11:08   Nm Pulmonary Perf And Vent  Result Date: 07/17/2016 CLINICAL DATA:  Shortness of breath EXAM: NUCLEAR MEDICINE VENTILATION - PERFUSION LUNG SCAN TECHNIQUE: Ventilation images were obtained in multiple projections using inhaled aerosol Tc-4717m DTPA. Perfusion images were obtained in multiple projections after intravenous  injection of Tc-7317m MAA. RADIOPHARMACEUTICALS:  32.0 mCi Technetium-2917m DTPA aerosol inhalation and 4.1 mCi Technetium-5717m MAA IV COMPARISON:  None. FINDINGS: Ventilation: No focal ventilation defect. Perfusion: No wedge shaped peripheral perfusion defects to suggest acute pulmonary embolism. IMPRESSION: Very low probability for acute pulmonary embolus. Electronically Signed   By: Signa Kellaylor  Stroud M.D.   On: 07/17/2016 12:02   Dg Chest Portable 1 View  Result Date: 07/16/2016 CLINICAL DATA:  Leukocytosis, transient confusion and dementia. EXAM: PORTABLE CHEST 1 VIEW COMPARISON:  05/25/2016 FINDINGS: There is stable cardiomegaly with aortic atherosclerosis. Lung volumes are slightly low with crowding of interstitial lung markings and mild vascular congestion. No pneumonic consolidation, effusion or pneumothorax. Osteoarthritis of the glenohumeral joints bilaterally. IMPRESSION: Stable cardiomegaly with aortic atherosclerosis. Mild pulmonary vascular congestion without pneumonic consolidations. No acute osseous abnormality. Electronically Signed   By: Tollie Ethavid  Kwon M.D.   On: 07/16/2016 23:30        Scheduled Meds: . aspirin EC  81 mg Oral BID WC  . atorvastatin  20 mg Oral q1800  . carvedilol  3.125 mg Oral BID WC  . feeding supplement (ENSURE ENLIVE)  237 mL Oral BID BM  . ferrous sulfate  325 mg Oral Q breakfast  . heparin subcutaneous  5,000 Units Subcutaneous Q8H  . Influenza vac split quadrivalent PF  0.5 mL Intramuscular Tomorrow-1000  . memantine  10 mg Oral BID  . mirtazapine  15 mg Oral QHS  . multivitamin with minerals  1 tablet Oral Daily  . potassium chloride SA  40 mEq Oral Q4H  . saccharomyces boulardii  250 mg Oral BID  . senna-docusate  1 tablet Oral QHS  . sodium chloride flush  3 mL Intravenous Q12H   Continuous Infusions: . sodium chloride 75 mL/hr at 07/17/16 2114     LOS: 0 days    Time spent: 35 minutes    Soul Deveney, MD [facility] Pager  [phone]  If 7PM-7AM, please contact night-coverage [email or web address] Password [email or web address] 07/18/2016, 12:45 PM

## 2016-07-18 NOTE — Progress Notes (Signed)
VASCULAR LAB PRELIMINARY  PRELIMINARY  PRELIMINARY  PRELIMINARY  Carotid duplex completed.    Preliminary report:  1-39% ICA plaquing.  Vertebral artery flow is antegrade.   Ariel Douglas, RVT 07/18/2016, 11:25 AM

## 2016-07-18 NOTE — Evaluation (Signed)
Occupational Therapy Evaluation Patient Details Name: Ariel Douglas MRN: [id number] DOB: [birth year] Today's Date: 07/18/2016    History of Present Illness Pt admitted after syncopal episode at her SNF. Negative for stroke, PE/DVT. PMH: dementia, TIA, L hip fx in September, CAD, CHF, CKD, depression, HTN.   Clinical Impression   Pt is a resident of [facility]. She presents with impaired cognition, decreased activity tolerance and poor balance requiring min assist for mobility and set up to total assist with ADL. Plan is for pt to  Return to SNF upon d/c. Will defer further OT to SNF.     Follow Up Recommendations  SNF;Supervision/Assistance - 24 hour    Equipment Recommendations  None recommended by OT    Recommendations for Other Services       Precautions / Restrictions Precautions Precautions: Fall Restrictions Weight Bearing Restrictions: No      Mobility Bed Mobility               General bed mobility comments: pt sitting at EOB with NT upon arrival  Transfers Overall transfer level: Needs assistance Equipment used: Rolling walker (2 wheeled) Transfers: Sit to/from Stand Sit to Stand: Min assist         General transfer comment: assist to rise and steady, cues for hand placement    Balance Overall balance assessment: Needs assistance   Sitting balance-Leahy Scale: Fair       Standing balance-Leahy Scale: Poor                              ADL Overall ADL's : Needs assistance/impaired Eating/Feeding: Set up;Bed level   Grooming: Wash/dry hands;Wash/dry face;Supervision/safety;Sitting   Upper Body Bathing: Minimal assitance;Sitting   Lower Body Bathing: Maximal assistance;Sit to/from stand   Upper Body Dressing : Minimal assistance;Sitting   Lower Body Dressing: Supervision/safety;Sitting/lateral leans Lower Body Dressing Details (indicate cue type and reason): changed socks Toilet Transfer: Minimal  assistance;Ambulation;RW Toilet Transfer Details (indicate cue type and reason): simulated to chair Toileting- Clothing Manipulation and Hygiene: Total assistance;Sit to/from stand Toileting - Clothing Manipulation Details (indicate cue type and reason): pt with bowel incontinence     Functional mobility during ADLs: Minimal assistance;Rolling walker;Cueing for sequencing;Cueing for safety       Vision     Perception     Praxis      Pertinent Vitals/Pain Pain Assessment: Faces Faces Pain Scale: No hurt     Hand Dominance Right   Extremity/Trunk Assessment Upper Extremity Assessment Upper Extremity Assessment: Overall WFL for tasks assessed   Lower Extremity Assessment Lower Extremity Assessment: Defer to PT evaluation       Communication Communication Communication: HOH   Cognition Arousal/Alertness: Awake/alert Behavior During Therapy: WFL for tasks assessed/performed Overall Cognitive Status: History of cognitive impairments - at baseline                     General Comments       Exercises       Shoulder Instructions      Home Living Family/patient expects to be discharged to:: Skilled nursing facility                                        Prior Functioning/Environment          Comments: pt not a good historian, has lived  at [facility] since her hip fx 6 weeks ago.        OT Problem List: Impaired balance (sitting and/or standing);Decreased cognition;Decreased knowledge of use of DME or AE;Decreased activity tolerance   OT Treatment/Interventions:      OT Goals(Current goals can be found in the care plan section)    OT Frequency:     Barriers to D/C:            Co-evaluation PT/OT/SLP Co-Evaluation/Treatment: Yes Reason for Co-Treatment: Necessary to address cognition/behavior during functional activity (pt not likely to tolerate both therapies)   OT goals addressed during session: ADL's and self-care       End of Session Equipment Utilized During Treatment: Rolling walker Nurse Communication: Mobility status  Activity Tolerance: Patient tolerated treatment well Patient left: in chair;with call bell/phone within reach;with chair alarm set   Time: 1423-1446 OT Time Calculation (min): 23 min Charges:  OT General Charges $OT Visit: 1 Procedure OT Evaluation $OT Eval Moderate Complexity: 1 Procedure G-Codes: OT G-codes **NOT FOR INPATIENT CLASS** Functional Assessment Tool Used: clinical judgement Functional Limitation: Self care Self Care Current Status (M0102(G8987): At least 40 percent but less than 60 percent impaired, limited or restricted Self Care Discharge Status [phone]): At least 40 percent but less than 60 percent impaired, limited or restricted  Evern BioMayberry, Zaydn Gutridge Lynn 07/18/2016, 3:25 PM  [phone]

## 2016-07-18 NOTE — Clinical Social Work Note (Signed)
Clinical Social Work Assessment  Patient Details  Name: Ariel Douglas MRN: [id number] Date of Birth: May 05, 1919  Date of referral:  07/18/16               Reason for consult:  Facility Placement, Discharge Planning                Permission sought to share information with:  Facility Medical sales representativeContact Representative, Family Supports Permission granted to share information::  Yes, Verbal Permission Granted  Name::     Ariel Douglas  Agency::  [facility]  Relationship::  Daughter  Contact Information:  [phone]  Housing/Transportation Living arrangements for the past 2 months:  Skilled Nursing Facility Source of Information:  Medical Team, Adult Children Patient Interpreter Needed:  None Criminal Activity/Legal Involvement Pertinent to Current Situation/Hospitalization:  No - Comment as needed Significant Relationships:  Adult Children Lives with:  Facility Resident Do you feel safe going back to the place where you live?  Yes Need for family participation in patient care:  Yes (Comment)  Care giving concerns:  Patient is from [facility].   Social Worker assessment / plan:  Patient only oriented to self. CSW attempted to call first emergency contact, Suzette Battiestynthia Oliver. No answer/voicemail. CSW called patient's daughter Ariel Douglas. CSW introduced role and explained that discharge planning would be discussed. Patient's daughter confirmed that patient came from [facility] and the plan is for her to return. Per patient's daughter, she has been there 4-6 weeks following hip surgery at [facility]. CSW contacted Burna MortimerNikki Tsujii, admissions coordinator at [facility], to confirm that patient can return. Patient's Medicaid is pending and she has been on the long-term side of their facility. PT evaluation pending. No further concerns. CSW encouraged patient's daughter to contact CSW as needed. She is awaiting a call from the MD to discuss her diagnoses. CSW will continue to  follow patient and her family for support and facilitate discharge back to [facility] once medically stable.  Employment status:  Retired Database administratornsurance information:  Managed Medicare PT Recommendations:  Not assessed at this time Information / Referral to community resources:  Skilled Nursing Facility  Patient/Family's Response to care:  Patient only oriented to self. Patient's daughter is agreeable to return to SNF. Patient's family supportive and involved in patient's care. Patient's daughter appreciated social work intervention.  Patient/Family's Understanding of and Emotional Response to Diagnosis, Current Treatment, and Prognosis:  Patient only oriented to self. Patient's daughter understands the need for return to SNF once discharged. Patient's daughter appears happy with hospital care.  Emotional Assessment Appearance:  Appears stated age Attitude/Demeanor/Rapport:  Unable to Assess Affect (typically observed):  Unable to Assess Orientation:  Oriented to Self Alcohol / Substance use:  Never Used Psych involvement (Current and /or in the community):  No (Comment)  Discharge Needs  Concerns to be addressed:  Care Coordination Readmission within the last 30 days:  No Current discharge risk:  Cognitively Impaired, Dependent with Mobility Barriers to Discharge:  Continued Medical Work up   Margarito LinerSarah C Omario Ander, LCSW 07/18/2016, 11:43 AM

## 2016-07-19 ENCOUNTER — Observation Stay (HOSPITAL_BASED_OUTPATIENT_CLINIC_OR_DEPARTMENT_OTHER): Payer: Medicare Other

## 2016-07-19 DIAGNOSIS — I6320 Cerebral infarction due to unspecified occlusion or stenosis of unspecified precerebral arteries: Secondary | ICD-10-CM | POA: Diagnosis not present

## 2016-07-19 DIAGNOSIS — I129 Hypertensive chronic kidney disease with stage 1 through stage 4 chronic kidney disease, or unspecified chronic kidney disease: Secondary | ICD-10-CM | POA: Diagnosis not present

## 2016-07-19 DIAGNOSIS — Z880 Allergy status to penicillin: Secondary | ICD-10-CM | POA: Diagnosis not present

## 2016-07-19 DIAGNOSIS — Z9181 History of falling: Secondary | ICD-10-CM | POA: Diagnosis not present

## 2016-07-19 DIAGNOSIS — A0472 Enterocolitis due to Clostridium difficile, not specified as recurrent: Secondary | ICD-10-CM | POA: Diagnosis not present

## 2016-07-19 DIAGNOSIS — R627 Adult failure to thrive: Secondary | ICD-10-CM | POA: Diagnosis not present

## 2016-07-19 DIAGNOSIS — I493 Ventricular premature depolarization: Secondary | ICD-10-CM | POA: Diagnosis not present

## 2016-07-19 DIAGNOSIS — R55 Syncope and collapse: Secondary | ICD-10-CM | POA: Diagnosis not present

## 2016-07-19 DIAGNOSIS — M6281 Muscle weakness (generalized): Secondary | ICD-10-CM | POA: Diagnosis not present

## 2016-07-19 DIAGNOSIS — Z7982 Long term (current) use of aspirin: Secondary | ICD-10-CM | POA: Diagnosis not present

## 2016-07-19 DIAGNOSIS — I700 Atherosclerosis of aorta: Secondary | ICD-10-CM | POA: Diagnosis not present

## 2016-07-19 DIAGNOSIS — Z8249 Family history of ischemic heart disease and other diseases of the circulatory system: Secondary | ICD-10-CM | POA: Diagnosis not present

## 2016-07-19 DIAGNOSIS — Z825 Family history of asthma and other chronic lower respiratory diseases: Secondary | ICD-10-CM | POA: Diagnosis not present

## 2016-07-19 DIAGNOSIS — I5042 Chronic combined systolic (congestive) and diastolic (congestive) heart failure: Secondary | ICD-10-CM | POA: Diagnosis not present

## 2016-07-19 DIAGNOSIS — R410 Disorientation, unspecified: Secondary | ICD-10-CM | POA: Diagnosis not present

## 2016-07-19 DIAGNOSIS — I130 Hypertensive heart and chronic kidney disease with heart failure and stage 1 through stage 4 chronic kidney disease, or unspecified chronic kidney disease: Secondary | ICD-10-CM | POA: Diagnosis not present

## 2016-07-19 DIAGNOSIS — I69391 Dysphagia following cerebral infarction: Secondary | ICD-10-CM | POA: Diagnosis not present

## 2016-07-19 DIAGNOSIS — E43 Unspecified severe protein-calorie malnutrition: Secondary | ICD-10-CM | POA: Diagnosis not present

## 2016-07-19 DIAGNOSIS — I69320 Aphasia following cerebral infarction: Secondary | ICD-10-CM | POA: Diagnosis not present

## 2016-07-19 DIAGNOSIS — E860 Dehydration: Secondary | ICD-10-CM | POA: Diagnosis not present

## 2016-07-19 DIAGNOSIS — G301 Alzheimer's disease with late onset: Secondary | ICD-10-CM | POA: Diagnosis not present

## 2016-07-19 DIAGNOSIS — Z8673 Personal history of transient ischemic attack (TIA), and cerebral infarction without residual deficits: Secondary | ICD-10-CM | POA: Diagnosis not present

## 2016-07-19 DIAGNOSIS — I2510 Atherosclerotic heart disease of native coronary artery without angina pectoris: Secondary | ICD-10-CM | POA: Diagnosis not present

## 2016-07-19 DIAGNOSIS — R1312 Dysphagia, oropharyngeal phase: Secondary | ICD-10-CM | POA: Diagnosis not present

## 2016-07-19 DIAGNOSIS — N183 Chronic kidney disease, stage 3 (moderate): Secondary | ICD-10-CM | POA: Diagnosis not present

## 2016-07-19 DIAGNOSIS — E785 Hyperlipidemia, unspecified: Secondary | ICD-10-CM | POA: Diagnosis not present

## 2016-07-19 DIAGNOSIS — E440 Moderate protein-calorie malnutrition: Secondary | ICD-10-CM | POA: Diagnosis not present

## 2016-07-19 DIAGNOSIS — S72092D Other fracture of head and neck of left femur, subsequent encounter for closed fracture with routine healing: Secondary | ICD-10-CM | POA: Diagnosis not present

## 2016-07-19 DIAGNOSIS — R2681 Unsteadiness on feet: Secondary | ICD-10-CM | POA: Diagnosis not present

## 2016-07-19 DIAGNOSIS — R000 Tachycardia, unspecified: Secondary | ICD-10-CM | POA: Diagnosis not present

## 2016-07-19 LAB — BASIC METABOLIC PANEL
ANION GAP: 6 (ref 5–15)
BUN: 11 mg/dL (ref 4–21)
BUN: 11 mg/dL (ref 6–20)
CALCIUM: 8.6 mg/dL — AB (ref 8.9–10.3)
CHLORIDE: 115 mmol/L — AB (ref 101–111)
CO2: 21 mmol/L — AB (ref 22–32)
Creatinine, Ser: 1.21 mg/dL — ABNORMAL HIGH (ref 0.44–1.00)
Creatinine: 1.2 mg/dL — AB (ref 0.5–1.1)
GFR calc non Af Amer: 37 mL/min — ABNORMAL LOW (ref >60)
GFR, EST AFRICAN AMERICAN: 43 mL/min — AB (ref >60)
Glucose, Bld: 87 mg/dL (ref 65–99)
Glucose: 87 mg/dL
Potassium: 4.2 mmol/L (ref 3.5–5.1)
SODIUM: 142 mmol/L (ref 137–147)
SODIUM: 142 mmol/L (ref 135–145)

## 2016-07-19 LAB — VAS US CAROTID
LCCADSYS: 58 cm/s
LCCAPDIAS: 16 cm/s
LEFT ECA DIAS: -3 cm/s
LEFT VERTEBRAL DIAS: -13 cm/s
Left CCA dist dias: 12 cm/s
Left CCA prox sys: 82 cm/s
Left ICA dist dias: -16 cm/s
Left ICA dist sys: -47 cm/s
Left ICA prox dias: -18 cm/s
Left ICA prox sys: -56 cm/s
RCCADSYS: -45 cm/s
RCCAPDIAS: 17 cm/s
RCCAPSYS: 88 cm/s
RIGHT ECA DIAS: -7 cm/s
RIGHT VERTEBRAL DIAS: 20 cm/s

## 2016-07-19 LAB — ECHOCARDIOGRAM COMPLETE
AVPHT: 296 ms
CHL CUP MV DEC (S): 246
CHL CUP TV REG PEAK VELOCITY: 282 cm/s
EWDT: 246 ms
FS: 21 % — AB (ref 28–44)
Height: 64 in
IV/PV OW: 1.1
LA ID, A-P, ES: 36 mm
LA vol index: 32.8 mL/m2
LA vol: 47.4 mL
LADIAMINDEX: 2.49 cm/m2
LAVOLA4C: 46 mL
LEFT ATRIUM END SYS DIAM: 36 mm
LV PW d: 10 mm — AB (ref 0.6–1.1)
LV TDI E'MEDIAL: 7.94
LVELAT: 7.94 cm/s
LVOT area: 3.14 cm2
LVOTD: 20 mm
MV pk E vel: 0.8 m/s
RV LATERAL S' VELOCITY: 17.2 cm/s
RV sys press: 35 mmHg
TAPSE: 17.5 mm
TDI e' lateral: 7.94
TRMAXVEL: 282 cm/s
Weight: 1649.6 oz

## 2016-07-19 LAB — GLUCOSE, CAPILLARY: GLUCOSE-CAPILLARY: 83 mg/dL (ref 65–99)

## 2016-07-19 MED ORDER — HYDROCODONE-ACETAMINOPHEN 5-325 MG PO TABS
1.0000 | ORAL_TABLET | Freq: Three times a day (TID) | ORAL | 0 refills | Status: DC | PRN
Start: 2016-07-19 — End: 2017-11-18

## 2016-07-19 MED ORDER — LORAZEPAM 1 MG PO TABS: 1.0000 mg | ORAL_TABLET | Freq: Two times a day (BID) | ORAL | 0 refills | Status: DC | PRN

## 2016-07-19 NOTE — Discharge Summary (Signed)
Physician Discharge Summary  Ariel Douglas WUJ:[id number] DOB: March 18, 1919 DOA: 07/16/2016  PCP: [name], MD  Admit date: 07/16/2016 Discharge date: 07/19/2016  Time spent: 60 minutes  Recommendations for Outpatient Follow-up:  1. Follow-up with M.D. at skilled nursing facility.   Discharge Diagnoses:  Principal Problem:   Syncope Active Problems:   CVA (cerebral vascular accident) (HCC)   Chronic kidney disease, stage 3   Dementia   Hypertension   Hyperlipidemia   Depression with anxiety   Chronic combined systolic and diastolic CHF (congestive heart failure) (HCC)   Leukocytosis   Pressure injury of skin   Malnutrition of moderate degree   Discharge Condition: Stable and improved  Diet recommendation: Heart healthy  Filed Weights   07/17/16 0208 07/18/16 0551 07/19/16 0455  Weight: 45.6 kg (100 lb 9.6 oz) 46.7 kg (102 lb 14.4 oz) 46.8 kg (103 lb 1.6 oz)    History of present illness:  Per Dr Lyn Records Ariel Douglas is a 82 y.o. female with medical history significant of dementia, TIA, CAD, chronic combined systolic and diastolic CHF, CKD-III, depression, hypertension, hyperlipidemia, recent left hip surgery, who presented with syncope.  Patient had left hip fracture surgery 6 weeks ago, and is currently doing rehabilitation. Per report, pt lost consciousness for a period of 1 to 5 minutes in facility. She moves all extremities. Denied chest pain, shortness of breath, nausea, vomiting, diarrhea, abdominal pain, seizure. No vision change or hearing loss. No slurred speech or facial droop. Denied symptoms of a UTI. Patient has asymmetric leg edema (L>R).   ED Course: pt was found to have positive D-dimer 3.23, WBC 22.1, negative urinalysis, stable renal function, temperature 99.3, tachycardia, tachypnea, oxygen saturation 90% on room air, blood pressure 107/58, chest x-ray showed vascular congestion without infiltration. CT-head was ordered, but pt refused it. Pt  was placed on tele bed for obs.  Hospital Course:  #1 syncope ?? Etiology. Patient was admitted with a syncope workup. Patient with no further syncopal episodes during this hospitalization. Carotid Dopplers with no significant ICA stenosis. Lower extremity Dopplers negative for DVT. VQ scan was very low probability for PE. No arrhythmias noted on telemetry. 2-D echo which was done with a EF of 45-50% with no wall motion abnormalities. Grade 2 diastolic dysfunction. No significant change from prior 2-D echo. Patient noted to be somewhat orthostatic by pulse however remained asymptomatic. Patient was hydrated with IV fluids with improvement. Patient had no seizure-like activity. Patient was seen by PT OT. Patient will be discharged in stable and improved condition.   #2 chronic kidney disease stage III Stable.  #3 chronic combined systolic and diastolic heart failure Remained stable throughout the hospitalization.   #4 dementia Stable. Continued on Namenda, Remeron.  #5 hyperlipidemia Continued on statin.  #6 depression/anxiety Stable.  #7 CVA Patient was maintained on aspirin for secondary stroke prevention. PT/OT.  #8 leukocytosis Likely stressed emargination. No signs of infection. Resolved.    Procedures:  Lower extremity Dopplers 07/18/2016  Carotid Dopplers 07/18/2016  2-D echo pending  CT head 07/17/2016  VQ scan 07/17/2016  2-D echo 07/19/2016   Consultations:  None  Discharge Exam: Vitals:   07/18/16 1957 07/19/16 0455  BP: 101/68 121/72  Pulse: 96 94  Resp: 18 18  Temp: 98.5 F (36.9 C) 98.6 F (37 C)    General: NAD Cardiovascular: RRR WITH 3/6 SEM Respiratory: CTAB  Discharge Instructions   Discharge Instructions    Diet - low sodium heart healthy    Complete  by:  As directed    Increase activity slowly    Complete by:  As directed      Current Discharge Medication List    CONTINUE these medications which have CHANGED    Details  HYDROcodone-acetaminophen (NORCO/VICODIN) 5-325 MG tablet Take 1 tablet by mouth every 8 (eight) hours as needed for moderate pain or severe pain. Qty: 15 tablet, Refills: 0    LORazepam (ATIVAN) 1 MG tablet Take 1 tablet (1 mg total) by mouth 2 (two) times daily as needed for anxiety. Qty: 15 tablet, Refills: 0      CONTINUE these medications which have NOT CHANGED   Details  aspirin EC 81 MG EC tablet Take 1 tablet (81 mg total) by mouth 2 (two) times daily with a meal. Qty: 60 tablet, Refills: 1    atorvastatin (LIPITOR) 20 MG tablet Take 1 tablet (20 mg total) by mouth daily at 6 PM.    bisacodyl (DULCOLAX) 5 MG EC tablet Take 1 tablet (5 mg total) by mouth daily as needed for moderate constipation. Qty: 30 tablet, Refills: 0    carvedilol (COREG) 3.125 MG tablet Take 1 tablet (3.125 mg total) by mouth 2 (two) times daily with a meal.    feeding supplement, ENSURE ENLIVE, (ENSURE ENLIVE) LIQD Take 237 mLs by mouth 2 (two) times daily between meals. Qty: 237 mL, Refills: 12    ferrous sulfate 325 (65 FE) MG tablet Take 325 mg by mouth daily with breakfast.    memantine (NAMENDA) 10 MG tablet Take 10 mg by mouth 2 (two) times daily.    mirtazapine (REMERON) 15 MG tablet Take 15 mg by mouth at bedtime.    Multiple Vitamin (MULTIVITAMIN) tablet Take 1 tablet by mouth daily.    saccharomyces boulardii (FLORASTOR) 250 MG capsule Take 250 mg by mouth 2 (two) times daily.    senna-docusate (SENOKOT-S) 8.6-50 MG tablet Take 1 tablet by mouth at bedtime.       Allergies  Allergen Reactions  . Penicillins Swelling    Facial and hand swelling per daughter Has patient had a PCN reaction causing immediate rash, facial/tongue/throat swelling, SOB or lightheadedness with hypotension: Yes Has patient had a PCN reaction causing severe rash involving mucus membranes or skin necrosis: No Has patient had a PCN reaction that required hospitalization No Has patient had a PCN  reaction occurring within the last 10 years: No If all of the above answers are "NO", then may proceed with Cephalosporin use.  Marland Kitchen. Penicillins Swelling    Has patient had a PCN reaction causing immediate rash, facial/tongue/throat swelling, SOB or lightheadedness with hypotension: Yes Has patient had a PCN reaction causing severe rash involving mucus membranes or skin necrosis: No Has patient had a PCN reaction that required hospitalization No Has patient had a PCN reaction occurring within the last 10 years: No If all of the above answers are "NO", then may proceed with Cephalosporin use.    Follow-up Information    MD AT SNF Follow up.   Why:  F/U WITH MD AT SNF           The results of significant diagnostics from this hospitalization (including imaging, microbiology, ancillary and laboratory) are listed below for reference.    Significant Diagnostic Studies: Ct Head Wo Contrast  Result Date: 07/17/2016 CLINICAL DATA:  Syncope EXAM: CT HEAD WITHOUT CONTRAST TECHNIQUE: Contiguous axial images were obtained from the base of the skull through the vertex without intravenous contrast. COMPARISON:  CT head 05/24/2016  FINDINGS: Brain: Moderate to advanced atrophy. Chronic microvascular ischemic change in the white matter. Chronic infarct right occipital lobe unchanged. Chronic infarct left frontal operculum and insula. Negative for acute infarct. Negative for hemorrhage. Calcific masses in the right frontal region and left temporal region adjacent to the inner table compatible with calcified meningioma unchanged from the prior study. Vascular: Arterial calcification.  Negative for dense MCA Skull: Negative Sinuses/Orbits: Negative Other: None IMPRESSION: Moderate to advanced atrophy. Extensive chronic ischemic change. No acute abnormality. Electronically Signed   By: Marlan Palau M.D.   On: 07/17/2016 11:08   Nm Pulmonary Perf And Vent  Result Date: 07/17/2016 CLINICAL DATA:  Shortness of  breath EXAM: NUCLEAR MEDICINE VENTILATION - PERFUSION LUNG SCAN TECHNIQUE: Ventilation images were obtained in multiple projections using inhaled aerosol Tc-48m DTPA. Perfusion images were obtained in multiple projections after intravenous injection of Tc-57m MAA. RADIOPHARMACEUTICALS:  32.0 mCi Technetium-42m DTPA aerosol inhalation and 4.1 mCi Technetium-39m MAA IV COMPARISON:  None. FINDINGS: Ventilation: No focal ventilation defect. Perfusion: No wedge shaped peripheral perfusion defects to suggest acute pulmonary embolism. IMPRESSION: Very low probability for acute pulmonary embolus. Electronically Signed   By: Signa Kell M.D.   On: 07/17/2016 12:02   Dg Chest Portable 1 View  Result Date: 07/16/2016 CLINICAL DATA:  Leukocytosis, transient confusion and dementia. EXAM: PORTABLE CHEST 1 VIEW COMPARISON:  05/25/2016 FINDINGS: There is stable cardiomegaly with aortic atherosclerosis. Lung volumes are slightly low with crowding of interstitial lung markings and mild vascular congestion. No pneumonic consolidation, effusion or pneumothorax. Osteoarthritis of the glenohumeral joints bilaterally. IMPRESSION: Stable cardiomegaly with aortic atherosclerosis. Mild pulmonary vascular congestion without pneumonic consolidations. No acute osseous abnormality. Electronically Signed   By: Tollie Eth M.D.   On: 07/16/2016 23:30    Microbiology: Recent Results (from the past 240 hour(s))  MRSA PCR Screening     Status: None   Collection Time: 07/17/16  2:18 AM  Result Value Ref Range Status   MRSA by PCR NEGATIVE NEGATIVE Final    Comment:        The GeneXpert MRSA Assay (FDA approved for NASAL specimens only), is one component of a comprehensive MRSA colonization surveillance program. It is not intended to diagnose MRSA infection nor to guide or monitor treatment for MRSA infections.      Labs: Basic Metabolic Panel:  Recent Labs Lab 07/16/16 2214 07/17/16 0215 07/18/16 0420  07/18/16 0430 07/19/16 0319  NA 137 140 143  --  142  K 3.6 3.9 3.1*  --  4.2  CL 106 108 115*  --  115*  CO2 21* 23 22  --  21*  GLUCOSE 149* 136* 91  --  87  BUN 21* 18 16  --  11  CREATININE 1.36* 1.41* 1.31*  --  1.21*  CALCIUM 9.0 9.1 8.5*  --  8.6*  MG  --   --   --  1.8  --    Liver Function Tests:  Recent Labs Lab 07/16/16 2214  AST 19  ALT 10*  ALKPHOS 72  BILITOT 0.9  PROT 6.2*  ALBUMIN 2.8*   No results for input(s): LIPASE, AMYLASE in the last 168 hours. No results for input(s): AMMONIA in the last 168 hours. CBC:  Recent Labs Lab 07/16/16 2214 07/17/16 0215 07/18/16 0420  WBC 22.1* 20.2* 9.6  NEUTROABS 19.7*  --   --   HGB 10.0* 10.1* 8.1*  HCT 31.2* 31.7* 25.1*  MCV 81.9 82.6 82.3  PLT 347 342 278  Cardiac Enzymes:  Recent Labs Lab 07/17/16 0215 07/17/16 0656 07/17/16 1224  TROPONINI 0.04* <0.03 <0.03   BNP: BNP (last 3 results)  Recent Labs  07/16/16 2214  BNP 388.7*    ProBNP (last 3 results) No results for input(s): PROBNP in the last 8760 hours.  CBG:  Recent Labs Lab 07/16/16 2024 07/17/16 0715 07/17/16 1236 07/18/16 0646 07/19/16 0558  GLUCAP 137* 108* 91 86 83       Signed:  Kaliyan Osbourn MD.  [facility] 07/19/2016, 3:07 PM

## 2016-07-19 NOTE — Progress Notes (Signed)
Notified Echo department that patient's echocardiogram is currently holding the discharge, as notified by MD

## 2016-07-19 NOTE — NC FL2 (Signed)
[place] MEDICAID FL2 LEVEL OF CARE SCREENING TOOL     IDENTIFICATION  Patient Name: Ariel Douglas Birthdate: [birth year] Sex: female Admission Date (Current Location): 07/16/2016  [facility] [place]   Facility and Address:  [facility] [place], [address], [place], [place] [address]      Provider Number: [id number]  Attending Physician Name and Address:  Rodolph Bonganiel V Thompson, MD  Relative Name and Phone Number:       Current Level of Care: SNF Recommended Level of Care: Skilled Nursing Facility Prior Approval Number:    Date Approved/Denied:   PASRR Number: [id number]  Discharge Plan: SNF    Current Diagnoses: Patient Active Problem List   Diagnosis Date Noted  . Leukocytosis 07/17/2016  . Pressure injury of skin 07/17/2016  . Malnutrition of moderate degree 07/17/2016  . C. difficile diarrhea 07/07/2016  . Pedal edema 06/27/2016  . Postoperative anemia due to acute blood loss 06/10/2016  . Newly recognized heart murmur 06/10/2016  . Acute renal failure superimposed on stage 3 chronic kidney disease (HCC) 06/10/2016  . Chronic combined systolic and diastolic CHF (congestive heart failure) (HCC) 05/28/2016  . Protein-calorie malnutrition, severe 05/25/2016  . Left displaced femoral neck fracture (HCC) 05/25/2016  . Fall 05/24/2016  . Hypokalemia 05/24/2016  . AKI (acute kidney injury) (HCC) 05/24/2016  . Hypertensive heart disease with chronic combined systolic and diastolic congestive heart failure (HCC)   . Dementia   . Depression with anxiety   . HLD (hyperlipidemia)   . Dysphagia due to recent stroke 05/31/2015  . Hyperlipidemia 05/30/2015  . Vitamin D deficiency 05/30/2015  . Acute CVA (cerebrovascular accident) (HCC)   . CVA (cerebral vascular accident) (HCC) 05/21/2015  . Chronic kidney disease, stage 3 05/21/2015  . Dementia 05/21/2015  . Hypertension 05/21/2015  . UTI (urinary tract infection) 05/25/2013   . Acute renal failure (HCC) 05/25/2013  . Syncope 05/24/2013    Orientation RESPIRATION BLADDER Height & Weight     Self  Normal Incontinent Weight: 103 lb 1.6 oz (46.8 kg) Height:  5\' 4"  (162.6 cm)  BEHAVIORAL SYMPTOMS/MOOD NEUROLOGICAL BOWEL NUTRITION STATUS   (None)  (Dementia) Incontinent Diet (Soft)  AMBULATORY STATUS COMMUNICATION OF NEEDS Skin   Limited Assist Verbally PU Stage and Appropriate Care (Deep tissue injury left and right heel foam prn.)   PU Stage 2 Dressing:  (Mid sacrum. Foam prn.)                   Personal Care Assistance Level of Assistance  Bathing, Feeding, Dressing Bathing Assistance: Maximum assistance Feeding assistance: Limited assistance Dressing Assistance: Limited assistance     Functional Limitations Info  Sight, Hearing, Speech Sight Info: Adequate Hearing Info: Adequate Speech Info: Adequate    SPECIAL CARE FACTORS FREQUENCY  PT (By licensed PT), OT (By licensed OT), Blood pressure     PT Frequency: 5 x week OT Frequency: 5 x week            Contractures Contractures Info: Not present    Additional Factors Info  Code Status, Allergies, Psychotropic Code Status Info: Full Allergies Info: Penicillins Psychotropic Info: Anxiety and depression: Remeron 15 mg PO QHS         Current Medications (07/19/2016):  This is the current hospital active medication list Current Facility-Administered Medications  Medication Dose Route Frequency Provider Last Rate Last Dose  . acetaminophen (TYLENOL) tablet 650 mg  650 mg Oral Q6H PRN Lorretta HarpXilin Niu, MD  Or  . acetaminophen (TYLENOL) suppository 650 mg  650 mg Rectal Q6H PRN Lorretta HarpXilin Niu, MD      . aspirin EC tablet 81 mg  81 mg Oral BID WC Lorretta HarpXilin Niu, MD   81 mg at 07/19/16 0855  . atorvastatin (LIPITOR) tablet 20 mg  20 mg Oral q1800 Lorretta HarpXilin Niu, MD   20 mg at 07/18/16 1747  . bisacodyl (DULCOLAX) EC tablet 5 mg  5 mg Oral Daily PRN Lorretta HarpXilin Niu, MD      . carvedilol (COREG) tablet 3.125 mg   3.125 mg Oral BID WC Lorretta HarpXilin Niu, MD   3.125 mg at 07/19/16 0855  . feeding supplement (ENSURE ENLIVE) (ENSURE ENLIVE) liquid 237 mL  237 mL Oral BID BM Lorretta HarpXilin Niu, MD   237 mL at 07/19/16 0859  . ferrous sulfate tablet 325 mg  325 mg Oral Q breakfast Lorretta HarpXilin Niu, MD   325 mg at 07/19/16 0855  . heparin injection 5,000 Units  5,000 Units Subcutaneous Q8H Rodolph Bonganiel V Thompson, MD   5,000 Units at 07/19/16 0545  . HYDROcodone-acetaminophen (NORCO/VICODIN) 5-325 MG per tablet 1 tablet  1 tablet Oral Q8H PRN Lorretta HarpXilin Niu, MD      . LORazepam (ATIVAN) tablet 1 mg  1 mg Oral BID PRN Lorretta HarpXilin Niu, MD      . memantine [facility](NAMENDA) tablet 10 mg  10 mg Oral BID Lorretta HarpXilin Niu, MD   10 mg at 07/19/16 0855  . mirtazapine (REMERON) tablet 15 mg  15 mg Oral QHS Lorretta HarpXilin Niu, MD   15 mg at 07/18/16 2244  . multivitamin with minerals tablet 1 tablet  1 tablet Oral Daily Lorretta HarpXilin Niu, MD   1 tablet at 07/19/16 0855  . ondansetron (ZOFRAN) tablet 4 mg  4 mg Oral Q6H PRN Lorretta HarpXilin Niu, MD       Or  . ondansetron [facility](ZOFRAN) injection 4 mg  4 mg Intravenous Q6H PRN Lorretta HarpXilin Niu, MD      . saccharomyces boulardii (FLORASTOR) capsule 250 mg  250 mg Oral BID Lorretta HarpXilin Niu, MD   250 mg at 07/19/16 0857  . senna-docusate (Senokot-S) tablet 1 tablet  1 tablet Oral QHS Lorretta HarpXilin Niu, MD   1 tablet at 07/18/16 2244  . sodium chloride flush (NS) 0.9 % injection 3 mL  3 mL Intravenous Q12H Lorretta HarpXilin Niu, MD   3 mL at 07/19/16 0857  . technetium TC 10M diethylenetriame-pentaacetic acid (DTPA) injection 32 millicurie  32 millicurie Intravenous Once PRN Francene BoyersJames Maxwell, MD      . technetium TC 10M diethylenetriame-pentaacetic acid (DTPA) injection 32 millicurie  32 millicurie Inhalation Once PRN Francene BoyersJames Maxwell, MD         Discharge Medications: Please see discharge summary for a list of discharge medications.  Relevant Imaging Results:  Relevant Lab Results:   Additional Information SS#: [id number]  Margarito LinerSarah C Vernisha Bacote, LCSW

## 2016-07-19 NOTE — Progress Notes (Signed)
  Echocardiogram 2D Echocardiogram has been performed.  Ariel Douglas, Ariel Douglas 07/19/2016, 10:49 AM

## 2016-07-19 NOTE — Progress Notes (Signed)
Patient assisted to bedside commode with one person assist. Patient tolerated, no complaints of pain.

## 2016-07-19 NOTE — Clinical Social Work Note (Signed)
CSW facilitated patient discharge including contacting patient family and facility to confirm patient discharge plans. Clinical information faxed to facility and family agreeable with plan. CSW arranged ambulance transport via [facility] to [facility]. RN to call report prior to discharge ([phone]).  CSW will sign off for now as social work intervention is no longer needed. Please consult us again if new needs arise.  Delphine Sizemore, CSW [phone]  

## 2016-07-19 NOTE — Progress Notes (Signed)
Pt being discharged to [facility] via transport. Pt alert and oriented x1. VSS. Pt c/o no pain at this time. No signs of respiratory distress. Education complete and care plans resolved. IV removed with catheter intact and pt tolerated well. No further issues at this time. Pt to follow up with PCP. Jillyn HiddenStone,Majd Tissue R, RN

## 2016-07-22 ENCOUNTER — Non-Acute Institutional Stay (SKILLED_NURSING_FACILITY): Payer: Medicare Other | Admitting: Internal Medicine

## 2016-07-22 ENCOUNTER — Encounter: Payer: Self-pay | Admitting: Internal Medicine

## 2016-07-22 DIAGNOSIS — I5042 Chronic combined systolic (congestive) and diastolic (congestive) heart failure: Secondary | ICD-10-CM | POA: Diagnosis not present

## 2016-07-22 DIAGNOSIS — N183 Chronic kidney disease, stage 3 unspecified: Secondary | ICD-10-CM

## 2016-07-22 DIAGNOSIS — E785 Hyperlipidemia, unspecified: Secondary | ICD-10-CM

## 2016-07-22 DIAGNOSIS — F329 Major depressive disorder, single episode, unspecified: Secondary | ICD-10-CM | POA: Diagnosis not present

## 2016-07-22 DIAGNOSIS — I6320 Cerebral infarction due to unspecified occlusion or stenosis of unspecified precerebral arteries: Secondary | ICD-10-CM

## 2016-07-22 DIAGNOSIS — G301 Alzheimer's disease with late onset: Secondary | ICD-10-CM | POA: Diagnosis not present

## 2016-07-22 DIAGNOSIS — F0280 Dementia in other diseases classified elsewhere without behavioral disturbance: Secondary | ICD-10-CM

## 2016-07-22 DIAGNOSIS — R55 Syncope and collapse: Secondary | ICD-10-CM

## 2016-07-22 DIAGNOSIS — F32A Depression, unspecified: Secondary | ICD-10-CM

## 2016-07-22 NOTE — Progress Notes (Signed)
: Provider:  Randon Goldsmith. [name], MD Location:  [facility] Room Number: [phone] Place of Service:  SNF ([phone])  PCP: [name], MD Patient Care Team: [name], MD as PCP - General (Internal Medicine) [name], MD (Internal Medicine)  Extended Emergency Contact Information Primary Emergency Contact: OLIVER,CYNTHIA Home Phone: [phone] Relation: None Secondary Emergency Contact: Hilario Quarry [place] Home Phone: [phone] Mobile Phone: [phone] Relation: Daughter     Allergies: Penicillins and Penicillins  Chief Complaint  Patient presents with  . Readmit To SNF    Admit to Facility    HPI: Patient is 82 y.o. female with dementia, TIA, CAD, chronic combined systolic and diastolic CHF, CKD-III, depression, hypertension, hyperlipidemia, recent left hip surgery, who presented with syncope.Per report, pt lost consciousness for a period of 1 to 5 minutes in facility. She moves all extremities. Denied chest pain, shortness of breath, nausea, vomiting, diarrhea, abdominal pain, seizure. No vision change or hearing loss. No slurred speech or facial droop. Denied symptoms of a UTI. Patient had asymmetric leg edema (L>R). Pt was admitted to [facility] [address] from 11/14-17 where she underwent a syncope w/u. pt was found to have positive D-dimer 3.23, WBC 22.1, negative urinalysis, stable renal function, temperature 99.3, tachycardia, tachypnea, oxygen saturation 90% on room air, blood pressure 107/58, chest x-ray showed vascular congestion without infiltration. CT-head was ordered, but pt refused it. Pt was placed on tele bed for obs.Carotid Dopplers with no significant ICA stenosis. Lower extremity Dopplers negative for DVT. VQ scan was very low probability for PE. No arrhythmias noted on telemetry. 2-D echo which was done with a EF of 45-50% with no wall motion abnormalities. Grade 2 diastolic dysfunction. No significant change from prior 2-D echo. Patient  noted to be somewhat orthostatic by pulse however remained asymptomatic. Patient was hydrated with IV fluids with improvement. Patient had no seizure-like activity Pt is admitted to SNF for generalized weakness. While at SNF pt will be followed for dementia, tx with namenda, HLD, tx with lipitor and and CHF tx with coreg.  Past Medical History:  Diagnosis Date  . AKI (acute kidney injury) (HCC)   . Closed left hip fracture (HCC)   . Coronary artery disease   . Dementia   . Depression with anxiety   . Fall   . HLD (hyperlipidemia)   . Hyperlipidemia   . Hypertension   . Hypokalemia   . Transient ischemic attack (TIA)     Past Surgical History:  Procedure Laterality Date  . ABDOMINAL HYSTERECTOMY    . ANTERIOR APPROACH HEMI HIP ARTHROPLASTY Left 05/25/2016   Procedure: ANTERIOR APPROACH HEMI HIP ARTHROPLASTY;  Surgeon: Samson Frederic, MD;  Location: [facility];  Service: Orthopedics;  Laterality: Left;      Medication List       Accurate as of 07/22/16 10:51 AM. Always use your most recent med list.          aspirin 81 MG EC tablet Take 1 tablet (81 mg total) by mouth 2 (two) times daily with a meal.   atorvastatin 20 MG tablet Commonly known as:  LIPITOR Take 1 tablet (20 mg total) by mouth daily at 6 PM.   bisacodyl 5 MG EC tablet Commonly known as:  DULCOLAX Take 1 tablet (5 mg total) by mouth daily as needed for moderate constipation.   carvedilol 3.125 MG tablet Commonly known as:  COREG Take 1 tablet (3.125 mg total) by mouth 2 (two) times daily with  a meal.   feeding supplement (ENSURE ENLIVE) Liqd Take 237 mLs by mouth 2 (two) times daily between meals.   ferrous sulfate 325 (65 FE) MG tablet Take 325 mg by mouth daily with breakfast.   HYDROcodone-acetaminophen 5-325 MG tablet Commonly known as:  NORCO/VICODIN Take 1 tablet by mouth every 8 (eight) hours as needed for moderate pain or severe pain.   LORazepam 1 MG tablet Commonly known as:  ATIVAN Take 1  tablet (1 mg total) by mouth 2 (two) times daily as needed for anxiety.   memantine 10 MG tablet Commonly known as:  NAMENDA Take 10 mg by mouth 2 (two) times daily.   mirtazapine 15 MG tablet Commonly known as:  REMERON Take 15 mg by mouth at bedtime.   multivitamin tablet Take 1 tablet by mouth daily.   saccharomyces boulardii 250 MG capsule Commonly known as:  FLORASTOR Take 250 mg by mouth 2 (two) times daily.   senna-docusate 8.6-50 MG tablet Commonly known as:  Senokot-S Take 1 tablet by mouth at bedtime.       No orders of the defined types were placed in this encounter.   Immunization History  Administered Date(s) Administered  . Influenza,inj,Quad PF,36+ Mos 07/19/2016  . PPD Test 05/28/2016    Social History  Substance Use Topics  . Smoking status: Never Smoker  . Smokeless tobacco: Never Used  . Alcohol use No    Family history is   Family History  Problem Relation Age of Onset  . Asthma Other   . Heart disease Other       Review of   UTO 2/2 dementia     Vitals:   07/22/16 1036  BP: 130/76  Pulse: 76  Resp: 19  Temp: 97.1 F (36.2 C)    SpO2 Readings from Last 1 Encounters:  07/19/16 99%   Body mass index is 16.89 kg/m.     Physical Exam  GENERAL APPEARANCE: Alert,  No acute distress.  SKIN: No diaphoresis rash HEAD: Normocephalic, atraumatic  EYES: Conjunctiva/lids clear. Pupils round, reactive. EOMs intact.  EARS: External exam WNL, canals clear. Hearing grossly normal.  NOSE: No deformity or discharge.  MOUTH/THROAT: Lips w/o lesions  RESPIRATORY: Breathing is even, unlabored. Lung sounds are clear   CARDIOVASCULAR: Heart RRR 2/6 murmurs, no rubs or gallops. No peripheral edema.   GASTROINTESTINAL: Abdomen is soft, non-tender, not distended w/ normal bowel sounds. GENITOURINARY: Bladder non tender, not distended  MUSCULOSKELETAL: No abnormal joints or musculature NEUROLOGIC:  Cranial nerves 2-12 grossly intact. Moves  all extremities  PSYCHIATRIC: Mood and affect appropriate to situation with dementia, no behavioral issues  Patient Active Problem List   Diagnosis Date Noted  . Leukocytosis 07/17/2016  . Pressure injury of skin 07/17/2016  . Malnutrition of moderate degree 07/17/2016  . C. difficile diarrhea 07/07/2016  . Pedal edema 06/27/2016  . Postoperative anemia due to acute blood loss 06/10/2016  . Newly recognized heart murmur 06/10/2016  . Acute renal failure superimposed on stage 3 chronic kidney disease (HCC) 06/10/2016  . Chronic combined systolic and diastolic CHF (congestive heart failure) (HCC) 05/28/2016  . Protein-calorie malnutrition, severe 05/25/2016  . Left displaced femoral neck fracture (HCC) 05/25/2016  . Fall 05/24/2016  . Hypokalemia 05/24/2016  . AKI (acute kidney injury) (HCC) 05/24/2016  . Hypertensive heart disease with chronic combined systolic and diastolic congestive heart failure (HCC)   . Dementia   . Depression with anxiety   . HLD (hyperlipidemia)   . Dysphagia due  to recent stroke 05/31/2015  . Hyperlipidemia 05/30/2015  . Vitamin D deficiency 05/30/2015  . Acute CVA (cerebrovascular accident) (HCC)   . CVA (cerebral vascular accident) (HCC) 05/21/2015  . Chronic kidney disease, stage 3 05/21/2015  . Dementia 05/21/2015  . Hypertension 05/21/2015  . UTI (urinary tract infection) 05/25/2013  . Acute renal failure (HCC) 05/25/2013  . Syncope 05/24/2013      Labs reviewed: Basic Metabolic Panel:    Component Value Date/Time   NA 142 07/19/2016 0319   NA 142 07/19/2016   K 4.2 07/19/2016 0319   CL 115 (H) 07/19/2016 0319   CO2 21 (L) 07/19/2016 0319   GLUCOSE 87 07/19/2016 0319   BUN 11 07/19/2016 0319   BUN 11 07/19/2016   CREATININE 1.21 (H) 07/19/2016 0319   CALCIUM 8.6 (L) 07/19/2016 0319   PROT 6.2 (L) 07/16/2016 2214   ALBUMIN 2.8 (L) 07/16/2016 2214   AST 19 07/16/2016 2214   ALT 10 (L) 07/16/2016 2214   ALKPHOS 72 07/16/2016 2214    BILITOT 0.9 07/16/2016 2214   GFRNONAA 37 (L) 07/19/2016 0319   GFRAA 43 (L) 07/19/2016 0319     Recent Labs  07/17/16 0215 07/18/16 0420 07/18/16 0430 07/19/16 07/19/16 0319  NA 140 143  --  142 142  K 3.9 3.1*  --   --  4.2  CL 108 115*  --   --  115*  CO2 23 22  --   --  21*  GLUCOSE 136* 91  --   --  87  BUN 18 16  --  11 11  CREATININE 1.41* 1.31*  --  1.2* 1.21*  CALCIUM 9.1 8.5*  --   --  8.6*  MG  --   --  1.8  --   --    Liver Function Tests:  Recent Labs  06/28/16 07/16/16 2214  AST 19 19  ALT 14 10*  ALKPHOS 114 72  BILITOT  --  0.9  PROT  --  6.2*  ALBUMIN  --  2.8*   No results for input(s): LIPASE, AMYLASE in the last 8760 hours. No results for input(s): AMMONIA in the last 8760 hours. CBC:  Recent Labs  05/23/16 2336  07/16/16 2214 07/17/16 07/17/16 0215 07/18/16 07/18/16 0420  WBC 10.8*  < > 22.1* 20.2 20.2* 9.6 9.6  NEUTROABS 9.6*  --  19.7*  --   --   --   --   HGB 11.9*  < > 10.0* 10.1* 10.1*  --  8.1*  HCT 35.8*  < > 31.2* 32* 31.7*  --  25.1*  MCV 81.9  < > 81.9  --  82.6  --  82.3  PLT 178  < > 347 342 342  --  278  < > = values in this interval not displayed. Lipid  Recent Labs  06/28/16  CHOL 147  HDL 62  LDLCALC 73  TRIG 61    Cardiac Enzymes:  Recent Labs  07/17/16 0215 07/17/16 0656 07/17/16 1224  TROPONINI 0.04* <0.03 <0.03   BNP:  Recent Labs  07/16/16 2214  BNP 388.7*   No results found for: [facility] Lab Results  Component Value Date   HGBA1C 5.4 06/28/2016   Lab Results  Component Value Date   TSH 1.88 06/28/2016   No results found for: VITAMINB12 No results found for: FOLATE No results found for: IRON, TIBC, FERRITIN  Imaging and Procedures obtained prior to SNF admission: Ct Head Wo Contrast  Result Date:  07/17/2016 CLINICAL DATA:  Syncope EXAM: CT HEAD WITHOUT CONTRAST TECHNIQUE: Contiguous axial images were obtained from the base of the skull through the vertex without intravenous  contrast. COMPARISON:  CT head 05/24/2016 FINDINGS: Brain: Moderate to advanced atrophy. Chronic microvascular ischemic change in the white matter. Chronic infarct right occipital lobe unchanged. Chronic infarct left frontal operculum and insula. Negative for acute infarct. Negative for hemorrhage. Calcific masses in the right frontal region and left temporal region adjacent to the inner table compatible with calcified meningioma unchanged from the prior study. Vascular: Arterial calcification.  Negative for dense MCA Skull: Negative Sinuses/Orbits: Negative Other: None IMPRESSION: Moderate to advanced atrophy. Extensive chronic ischemic change. No acute abnormality. Electronically Signed   By: Marlan Palau M.D.   On: 07/17/2016 11:08   Nm Pulmonary Perf And Vent  Result Date: 07/17/2016 CLINICAL DATA:  Shortness of breath EXAM: NUCLEAR MEDICINE VENTILATION - PERFUSION LUNG SCAN TECHNIQUE: Ventilation images were obtained in multiple projections using inhaled aerosol Tc-20m DTPA. Perfusion images were obtained in multiple projections after intravenous injection of Tc-83m MAA. RADIOPHARMACEUTICALS:  32.0 mCi Technetium-100m DTPA aerosol inhalation and 4.1 mCi Technetium-72m MAA IV COMPARISON:  None. FINDINGS: Ventilation: No focal ventilation defect. Perfusion: No wedge shaped peripheral perfusion defects to suggest acute pulmonary embolism. IMPRESSION: Very low probability for acute pulmonary embolus. Electronically Signed   By: Signa Kell M.D.   On: 07/17/2016 12:02   Dg Chest Portable 1 View  Result Date: 07/16/2016 CLINICAL DATA:  Leukocytosis, transient confusion and dementia. EXAM: PORTABLE CHEST 1 VIEW COMPARISON:  05/25/2016 FINDINGS: There is stable cardiomegaly with aortic atherosclerosis. Lung volumes are slightly low with crowding of interstitial lung markings and mild vascular congestion. No pneumonic consolidation, effusion or pneumothorax. Osteoarthritis of the glenohumeral joints  bilaterally. IMPRESSION: Stable cardiomegaly with aortic atherosclerosis. Mild pulmonary vascular congestion without pneumonic consolidations. No acute osseous abnormality. Electronically Signed   By: Tollie Eth M.D.   On: 07/16/2016 23:30     Not all labs, radiology exams or other studies done during hospitalization come through on my EPIC note; however they are reviewed by me.    Assessment and Plan  SYNCOPE - Carotid Dopplers with no significant ICA stenosis. Lower extremity Dopplers negative for DVT. VQ scan was very low probability for PE. No arrhythmias noted on telemetry. 2-D echo which was done with a EF of 45-50% with no wall motion abnormalities. Grade 2 diastolic dysfunction. No significant change from prior 2-D echo. Patient noted to be somewhat orthostatic by pulse however remained asymptomatic. Patient was hydrated with IV fluids with improvement. Patient had no seizure-like activity.  SNF -admitted for OT/PT  CHRONIC COMBINED SYSTOLIC AND DIASTOLIC CHF SNF - stable; cont coreg 3.125 mg BID  CKD3 SNF - stable; d/c Cr 1.21; will f/u BMP  DEMENTIA SNF - stable' cont namenda 10 mg BID  HLD SNF - stable; cont lipitor 20 mg daily  CVA SNF - Cont ASA as prophylaxis and statin  DEPRESSION SNF - cont remeron 15 mg qHS    Time spent > 45 min;> 50% of time with patient was spent reviewing records, labs, tests and studies, counseling and developing plan of care  Thurston Hole D. [name], MD

## 2016-07-28 ENCOUNTER — Encounter: Payer: Self-pay | Admitting: Internal Medicine

## 2016-07-28 DIAGNOSIS — F39 Unspecified mood [affective] disorder: Secondary | ICD-10-CM | POA: Insufficient documentation

## 2016-07-30 ENCOUNTER — Non-Acute Institutional Stay (SKILLED_NURSING_FACILITY): Payer: Medicare Other | Admitting: Internal Medicine

## 2016-07-30 DIAGNOSIS — R627 Adult failure to thrive: Secondary | ICD-10-CM | POA: Diagnosis not present

## 2016-07-30 DIAGNOSIS — S72092D Other fracture of head and neck of left femur, subsequent encounter for closed fracture with routine healing: Secondary | ICD-10-CM | POA: Diagnosis not present

## 2016-07-30 DIAGNOSIS — A0472 Enterocolitis due to Clostridium difficile, not specified as recurrent: Secondary | ICD-10-CM | POA: Diagnosis not present

## 2016-07-31 ENCOUNTER — Encounter: Payer: Self-pay | Admitting: Internal Medicine

## 2016-07-31 NOTE — Progress Notes (Signed)
Location:  [facility] Room Number: [phone] Place of Service:  SNF [phone])  Randon Goldsmith. [name], MD  Patient Care Team: [name], MD as PCP - General (Internal Medicine) [name], MD (Internal Medicine)  Extended Emergency Contact Information Primary Emergency Contact: OLIVER,CYNTHIA Home Phone: [phone] Relation: None Secondary Emergency Contact: Hilario Quarry [place] Home Phone: [phone] Mobile Phone: [phone] Relation: Daughter    Allergies: Penicillins and Penicillins  Chief Complaint  Patient presents with  . Acute Visit    Acute    HPI: Patient is 82 y.o. female who is being seen in f/u for diagnosis of C Diff called to the on call provider over the holidays. Pt had had continuous small volume diarrhea with no smell but a C diff was sent because of it's long duration which came back + on 11/23.  Past Medical History:  Diagnosis Date  . AKI (acute kidney injury) (HCC)   . Closed left hip fracture (HCC)   . Coronary artery disease   . Dementia   . Depression with anxiety   . Fall   . HLD (hyperlipidemia)   . Hyperlipidemia   . Hypertension   . Hypokalemia   . Transient ischemic attack (TIA)     Past Surgical History:  Procedure Laterality Date  . ABDOMINAL HYSTERECTOMY    . ANTERIOR APPROACH HEMI HIP ARTHROPLASTY Left 05/25/2016   Procedure: ANTERIOR APPROACH HEMI HIP ARTHROPLASTY;  Surgeon: Samson Frederic, MD;  Location: [facility];  Service: Orthopedics;  Laterality: Left;      Medication List       Accurate as of 07/30/16 11:59 PM. Always use your most recent med list.          aspirin 81 MG EC tablet Take 1 tablet (81 mg total) by mouth 2 (two) times daily with a meal.   atorvastatin 20 MG tablet Commonly known as:  LIPITOR Take 1 tablet (20 mg total) by mouth daily at 6 PM.   bisacodyl 5 MG EC tablet Commonly known as:  DULCOLAX Take 1 tablet (5 mg total) by mouth daily as needed for  moderate constipation.   carvedilol 3.125 MG tablet Commonly known as:  COREG Take 1 tablet (3.125 mg total) by mouth 2 (two) times daily with a meal.   feeding supplement (ENSURE ENLIVE) Liqd Take 237 mLs by mouth 2 (two) times daily between meals.   ferrous sulfate 325 (65 FE) MG tablet Take 325 mg by mouth daily with breakfast.   HYDROcodone-acetaminophen 5-325 MG tablet Commonly known as:  NORCO/VICODIN Take 1 tablet by mouth every 8 (eight) hours as needed for moderate pain or severe pain.   LORazepam 1 MG tablet Commonly known as:  ATIVAN Take 1 tablet (1 mg total) by mouth 2 (two) times daily as needed for anxiety.   memantine 10 MG tablet Commonly known as:  NAMENDA Take 10 mg by mouth 2 (two) times daily.   mirtazapine 15 MG tablet Commonly known as:  REMERON Take 15 mg by mouth at bedtime.   multivitamin tablet Take 1 tablet by mouth daily.   saccharomyces boulardii 250 MG capsule Commonly known as:  FLORASTOR Take 250 mg by mouth 2 (two) times daily.   vancomycin 125 MG capsule Commonly known as:  VANCOCIN Take 125 mg by mouth every 6 (six) hours.       Meds ordered this encounter  Medications  . vancomycin (VANCOCIN) 125 MG capsule    Sig: Take 125  mg by mouth every 6 (six) hours.    Immunization History  Administered Date(s) Administered  . Influenza,inj,Quad PF,36+ Mos 07/19/2016  . PPD Test 05/28/2016    Social History  Substance Use Topics  . Smoking status: Never Smoker  . Smokeless tobacco: Never Used  . Alcohol use No    Review of Systems  UTO 2/2 dementia;per nursing diarrhea is improving and pt's appetite may be better.    Vitals:   07/30/16 1130  BP: 130/76  Pulse: 77  Resp: 20  Temp: 97.1 F (36.2 C)   Body mass index is 16.44 kg/m. Physical Exam  GENERAL APPEARANCE: Alert, min conversant, No acute distress; frail  SKIN: No diaphoresis rash HEENT: Unremarkable RESPIRATORY: Breathing is even, unlabored. Lung sounds  are clear   CARDIOVASCULAR: Heart RRR no murmurs, rubs or gallops. No peripheral edema  GASTROINTESTINAL: Abdomen is soft, non-tender, not distended w/ normal bowel sounds.  GENITOURINARY: Bladder non tender, not distended  MUSCULOSKELETAL: No abnormal joints or musculature NEUROLOGIC: Cranial nerves 2-12 grossly intact. Moves all extremities PSYCHIATRIC: pleasant dementia, no behavioral issues  Patient Active Problem List   Diagnosis Date Noted  . Depression 07/28/2016  . Leukocytosis 07/17/2016  . Pressure injury of skin 07/17/2016  . Malnutrition of moderate degree 07/17/2016  . C. difficile diarrhea 07/07/2016  . Pedal edema 06/27/2016  . Postoperative anemia due to acute blood loss 06/10/2016  . Newly recognized heart murmur 06/10/2016  . Acute renal failure superimposed on stage 3 chronic kidney disease (HCC) 06/10/2016  . Chronic combined systolic and diastolic CHF (congestive heart failure) (HCC) 05/28/2016  . Protein-calorie malnutrition, severe 05/25/2016  . Left displaced femoral neck fracture (HCC) 05/25/2016  . Fall 05/24/2016  . Hypokalemia 05/24/2016  . AKI (acute kidney injury) (HCC) 05/24/2016  . Hypertensive heart disease with chronic combined systolic and diastolic congestive heart failure (HCC)   . Dementia   . Depression with anxiety   . HLD (hyperlipidemia)   . Dysphagia due to recent stroke 05/31/2015  . Hyperlipidemia 05/30/2015  . Vitamin D deficiency 05/30/2015  . Acute CVA (cerebrovascular accident) (HCC)   . CVA (cerebral vascular accident) (HCC) 05/21/2015  . Chronic kidney disease, stage 3 05/21/2015  . Dementia 05/21/2015  . Hypertension 05/21/2015  . UTI (urinary tract infection) 05/25/2013  . Acute renal failure (HCC) 05/25/2013  . Syncope 05/24/2013    CMP     Component Value Date/Time   NA 142 07/19/2016 0319   NA 142 07/19/2016   K 4.2 07/19/2016 0319   CL 115 (H) 07/19/2016 0319   CO2 21 (L) 07/19/2016 0319   GLUCOSE 87 07/19/2016  0319   BUN 11 07/19/2016 0319   BUN 11 07/19/2016   CREATININE 1.21 (H) 07/19/2016 0319   CALCIUM 8.6 (L) 07/19/2016 0319   PROT 6.2 (L) 07/16/2016 2214   ALBUMIN 2.8 (L) 07/16/2016 2214   AST 19 07/16/2016 2214   ALT 10 (L) 07/16/2016 2214   ALKPHOS 72 07/16/2016 2214   BILITOT 0.9 07/16/2016 2214   GFRNONAA 37 (L) 07/19/2016 0319   GFRAA 43 (L) 07/19/2016 0319    Recent Labs  07/17/16 0215 07/18/16 0420 07/18/16 0430 07/19/16 07/19/16 0319  NA 140 143  --  142 142  K 3.9 3.1*  --   --  4.2  CL 108 115*  --   --  115*  CO2 23 22  --   --  21*  GLUCOSE 136* 91  --   --  87  BUN 18 16  --  11 11  CREATININE 1.41* 1.31*  --  1.2* 1.21*  CALCIUM 9.1 8.5*  --   --  8.6*  MG  --   --  1.8  --   --     Recent Labs  06/28/16 07/16/16 2214  AST 19 19  ALT 14 10*  ALKPHOS 114 72  BILITOT  --  0.9  PROT  --  6.2*  ALBUMIN  --  2.8*    Recent Labs  05/23/16 2336  07/16/16 2214 07/17/16 07/17/16 0215 07/18/16 07/18/16 0420  WBC 10.8*  < > 22.1* 20.2 20.2* 9.6 9.6  NEUTROABS 9.6*  --  19.7*  --   --   --   --   HGB 11.9*  < > 10.0* 10.1* 10.1*  --  8.1*  HCT 35.8*  < > 31.2* 32* 31.7*  --  25.1*  MCV 81.9  < > 81.9  --  82.6  --  82.3  PLT 178  < > 347 342 342  --  278  < > = values in this interval not displayed.  Recent Labs  06/28/16  CHOL 147  LDLCALC 73  TRIG 61   No results found for: [facility] Lab Results  Component Value Date   TSH 1.88 06/28/2016   Lab Results  Component Value Date   HGBA1C 5.4 06/28/2016   Lab Results  Component Value Date   CHOL 147 06/28/2016   HDL 62 06/28/2016   LDLCALC 73 06/28/2016   TRIG 61 06/28/2016   CHOLHDL 5.4 05/22/2015    Significant Diagnostic Results in last 30 days:  Ct Head Wo Contrast  Result Date: 07/17/2016 CLINICAL DATA:  Syncope EXAM: CT HEAD WITHOUT CONTRAST TECHNIQUE: Contiguous axial images were obtained from the base of the skull through the vertex without intravenous contrast. COMPARISON:   CT head 05/24/2016 FINDINGS: Brain: Moderate to advanced atrophy. Chronic microvascular ischemic change in the white matter. Chronic infarct right occipital lobe unchanged. Chronic infarct left frontal operculum and insula. Negative for acute infarct. Negative for hemorrhage. Calcific masses in the right frontal region and left temporal region adjacent to the inner table compatible with calcified meningioma unchanged from the prior study. Vascular: Arterial calcification.  Negative for dense MCA Skull: Negative Sinuses/Orbits: Negative Other: None IMPRESSION: Moderate to advanced atrophy. Extensive chronic ischemic change. No acute abnormality. Electronically Signed   By: Marlan Palauharles  Clark M.D.   On: 07/17/2016 11:08   Nm Pulmonary Perf And Vent  Result Date: 07/17/2016 CLINICAL DATA:  Shortness of breath EXAM: NUCLEAR MEDICINE VENTILATION - PERFUSION LUNG SCAN TECHNIQUE: Ventilation images were obtained in multiple projections using inhaled aerosol Tc-6745m DTPA. Perfusion images were obtained in multiple projections after intravenous injection of Tc-6545m MAA. RADIOPHARMACEUTICALS:  32.0 mCi Technetium-5645m DTPA aerosol inhalation and 4.1 mCi Technetium-6245m MAA IV COMPARISON:  None. FINDINGS: Ventilation: No focal ventilation defect. Perfusion: No wedge shaped peripheral perfusion defects to suggest acute pulmonary embolism. IMPRESSION: Very low probability for acute pulmonary embolus. Electronically Signed   By: Signa Kellaylor  Stroud M.D.   On: 07/17/2016 12:02   Dg Chest Portable 1 View  Result Date: 07/16/2016 CLINICAL DATA:  Leukocytosis, transient confusion and dementia. EXAM: PORTABLE CHEST 1 VIEW COMPARISON:  05/25/2016 FINDINGS: There is stable cardiomegaly with aortic atherosclerosis. Lung volumes are slightly low with crowding of interstitial lung markings and mild vascular congestion. No pneumonic consolidation, effusion or pneumothorax. Osteoarthritis of the glenohumeral joints bilaterally. IMPRESSION:  Stable cardiomegaly with aortic atherosclerosis. Mild  pulmonary vascular congestion without pneumonic consolidations. No acute osseous abnormality. Electronically Signed   By: Tollie Eth M.D.   On: 07/16/2016 23:30    Assessment and Plan  C difficile diarrhea - from pt's record it looks like she had C diff on 07/07/2016 making this her second bout. Pt was started on vancomycin 125 mg q 6 for 10 days which would be appropriate for a first recurrence. . She does appear to be improving.  FTT - pt has had this picture since admission;I don't know if she had C diff since admission or if it is her dementia; will monitor closely esp food and fluid intake    Time spent > 35 min;> 50% of time with patient was spent reviewing records, labs, tests and studies, counseling and developing plan of care  Thurston Hole D. [name], MD

## 2016-08-04 ENCOUNTER — Encounter: Payer: Self-pay | Admitting: Internal Medicine

## 2016-08-04 DIAGNOSIS — R627 Adult failure to thrive: Secondary | ICD-10-CM | POA: Insufficient documentation

## 2016-08-07 ENCOUNTER — Other Ambulatory Visit: Payer: Self-pay | Admitting: *Deleted

## 2016-08-07 NOTE — Patient Outreach (Signed)
[facility] Care Management Post-Acute Care Coordination  08/07/2016  Lauralyn PrimesLillie Douglas [birth year] [id number]   Communication with Laruth BouchardKirstin Dowd, LCSW for [facility]. Reviewed patient case. she confirms that patient is a Long Term Care resident of facility and there are no plans to discharge at this time.  RNCM will sign off case.  Alben SpittleMary E. Albertha GheeNiemczura, RN, BSN, CCM  [facility] Post[facility] Care Coordinator [phone]

## 2016-08-15 ENCOUNTER — Encounter: Payer: Self-pay | Admitting: Internal Medicine

## 2016-08-15 ENCOUNTER — Non-Acute Institutional Stay (SKILLED_NURSING_FACILITY): Payer: Medicare Other | Admitting: Internal Medicine

## 2016-08-15 DIAGNOSIS — A0472 Enterocolitis due to Clostridium difficile, not specified as recurrent: Secondary | ICD-10-CM | POA: Diagnosis not present

## 2016-08-15 NOTE — Progress Notes (Signed)
Location:  [facility] Room Number: [phone] Place of Service:  SNF [phone])  Ariel Douglas. [name], MD  Patient Care Team: [name], MD as PCP - General (Internal Medicine) [name], MD (Internal Medicine)  Extended Emergency Contact Information Primary Emergency Contact: OLIVER,CYNTHIA Home Phone: [phone] Relation: None Secondary Emergency Contact: Hilario Quarry [place] Home Phone: [phone] Mobile Phone: [phone] Relation: Daughter    Allergies: Penicillins and Penicillins  Chief Complaint  Patient presents with  . Acute Visit    Acute    HPI: Patient is 82 y.o. female who is being seen for C diff diarrhea. She has continued to have lowgrade diarrhea for days; no fever no nausea or vomiting. Test returned + for C diff. Pt has C Diff in the hospital and again in SNF. This will be pt's second recurrence.   Past Medical History:  Diagnosis Date  . AKI (acute kidney injury) (HCC)   . Closed left hip fracture (HCC)   . Coronary artery disease   . Dementia   . Depression with anxiety   . Fall   . HLD (hyperlipidemia)   . Hyperlipidemia   . Hypertension   . Hypokalemia   . Transient ischemic attack (TIA)     Past Surgical History:  Procedure Laterality Date  . ABDOMINAL HYSTERECTOMY    . ANTERIOR APPROACH HEMI HIP ARTHROPLASTY Left 05/25/2016   Procedure: ANTERIOR APPROACH HEMI HIP ARTHROPLASTY;  Surgeon: Samson Frederic, MD;  Location: [facility];  Service: Orthopedics;  Laterality: Left;      Medication List       Accurate as of 08/15/16  2:28 PM. Always use your most recent med list.          aspirin 81 MG EC tablet Take 1 tablet (81 mg total) by mouth 2 (two) times daily with a meal.   bisacodyl 5 MG EC tablet Commonly known as:  DULCOLAX Take 1 tablet (5 mg total) by mouth daily as needed for moderate constipation.   carvedilol 3.125 MG tablet Commonly known as:  COREG Take 1 tablet (3.125 mg total)  by mouth 2 (two) times daily with a meal.   ENSURE Take 237 mLs by mouth 3 (three) times daily between meals. Due to weight loss   feeding supplement (ENSURE ENLIVE) Liqd Take 237 mLs by mouth 2 (two) times daily between meals.   feeding supplement (PRO-STAT SUGAR FREE 64) Liqd Take 30 mLs by mouth 2 (two) times daily.   ferrous sulfate 325 (65 FE) MG tablet Take 325 mg by mouth daily with breakfast.   HYDROcodone-acetaminophen 5-325 MG tablet Commonly known as:  NORCO/VICODIN Take 1 tablet by mouth every 8 (eight) hours as needed for moderate pain or severe pain.   LORazepam 0.5 MG tablet Commonly known as:  ATIVAN Take 0.5 mg by mouth 2 (two) times daily as needed for anxiety.   memantine 10 MG tablet Commonly known as:  NAMENDA Take 10 mg by mouth 2 (two) times daily.   mirtazapine 15 MG tablet Commonly known as:  REMERON Take 15 mg by mouth at bedtime.   multivitamin tablet Take 1 tablet by mouth daily.   saccharomyces boulardii 250 MG capsule Commonly known as:  FLORASTOR Take 250 mg by mouth 2 (two) times daily.       No orders of the defined types were placed in this encounter.   Immunization History  Administered Date(s) Administered  . Influenza,inj,Quad PF,36+ Mos 07/19/2016  . PPD  Test 05/28/2016    Social History  Substance Use Topics  . Smoking status: Never Smoker  . Smokeless tobacco: Never Used  . Alcohol use No    Review of Systems  DATA OBTAINED: from patient, - pt can minimally participate nurse GENERAL:  no fevers, fatigue, appetite changes SKIN: No itching, rash HEENT: No complaint RESPIRATORY: No cough, wheezing, SOB CARDIAC: No chest pain, palpitations, lower extremity edema  GI: No abdominal pain, No N/V or constipation, + diarrhea No heartburn or reflux  GU: No dysuria, frequency or urgency, or incontinence  MUSCULOSKELETAL: No unrelieved bone/joint pain NEUROLOGIC: No headache, dizziness  PSYCHIATRIC: No overt anxiety or  sadness  Vitals:   08/15/16 1426  BP: (!) 159/85  Pulse: (!) 105  Resp: 18  Temp: 97.4 F (36.3 C)   Body mass index is 16.31 kg/m. Physical Exam  GENERAL APPEARANCE: Alert, min conversant, No acute distress  SKIN: No diaphoresis rash HEENT: Unremarkable RESPIRATORY: Breathing is even, unlabored. Lung sounds are clear   CARDIOVASCULAR: Heart RRR no murmurs, rubs or gallops. No peripheral edema  GASTROINTESTINAL: Abdomen is soft, non-tender, not distended w/ normal bowel sounds.  GENITOURINARY: Bladder non tender, not distended  MUSCULOSKELETAL: No abnormal joints or musculature NEUROLOGIC: Cranial nerves 2-12 grossly intact. Moves all extremities PSYCHIATRIC: Mood and affect appropriate to situation, no behavioral issues  Patient Active Problem List   Diagnosis Date Noted  . FTT (failure to thrive) in adult 08/04/2016  . Depression 07/28/2016  . Leukocytosis 07/17/2016  . Pressure injury of skin 07/17/2016  . Malnutrition of moderate degree 07/17/2016  . C. difficile diarrhea 07/07/2016  . Pedal edema 06/27/2016  . Postoperative anemia due to acute blood loss 06/10/2016  . Newly recognized heart murmur 06/10/2016  . Acute renal failure superimposed on stage 3 chronic kidney disease (HCC) 06/10/2016  . Chronic combined systolic and diastolic CHF (congestive heart failure) (HCC) 05/28/2016  . Protein-calorie malnutrition, severe 05/25/2016  . Left displaced femoral neck fracture (HCC) 05/25/2016  . Fall 05/24/2016  . Hypokalemia 05/24/2016  . AKI (acute kidney injury) (HCC) 05/24/2016  . Hypertensive heart disease with chronic combined systolic and diastolic congestive heart failure (HCC)   . Dementia   . Depression with anxiety   . HLD (hyperlipidemia)   . Dysphagia due to recent stroke 05/31/2015  . Hyperlipidemia 05/30/2015  . Vitamin D deficiency 05/30/2015  . Acute CVA (cerebrovascular accident) (HCC)   . CVA (cerebral vascular accident) (HCC) 05/21/2015  .  Chronic kidney disease, stage 3 05/21/2015  . Dementia 05/21/2015  . Hypertension 05/21/2015  . UTI (urinary tract infection) 05/25/2013  . Acute renal failure (HCC) 05/25/2013  . Syncope 05/24/2013    CMP     Component Value Date/Time   NA 142 07/19/2016 0319   NA 142 07/19/2016   K 4.2 07/19/2016 0319   CL 115 (H) 07/19/2016 0319   CO2 21 (L) 07/19/2016 0319   GLUCOSE 87 07/19/2016 0319   BUN 11 07/19/2016 0319   BUN 11 07/19/2016   CREATININE 1.21 (H) 07/19/2016 0319   CALCIUM 8.6 (L) 07/19/2016 0319   PROT 6.2 (L) 07/16/2016 2214   ALBUMIN 2.8 (L) 07/16/2016 2214   AST 19 07/16/2016 2214   ALT 10 (L) 07/16/2016 2214   ALKPHOS 72 07/16/2016 2214   BILITOT 0.9 07/16/2016 2214   GFRNONAA 37 (L) 07/19/2016 0319   GFRAA 43 (L) 07/19/2016 0319    Recent Labs  07/17/16 0215 07/18/16 0420 07/18/16 0430 07/19/16 07/19/16 0319  NA  140 143  --  142 142  K 3.9 3.1*  --   --  4.2  CL 108 115*  --   --  115*  CO2 23 22  --   --  21*  GLUCOSE 136* 91  --   --  87  BUN 18 16  --  11 11  CREATININE 1.41* 1.31*  --  1.2* 1.21*  CALCIUM 9.1 8.5*  --   --  8.6*  MG  --   --  1.8  --   --     Recent Labs  06/28/16 07/16/16 2214  AST 19 19  ALT 14 10*  ALKPHOS 114 72  BILITOT  --  0.9  PROT  --  6.2*  ALBUMIN  --  2.8*    Recent Labs  05/23/16 2336  07/16/16 2214 07/17/16 07/17/16 0215 07/18/16 07/18/16 0420  WBC 10.8*  < > 22.1* 20.2 20.2* 9.6 9.6  NEUTROABS 9.6*  --  19.7*  --   --   --   --   HGB 11.9*  < > 10.0* 10.1* 10.1*  --  8.1*  HCT 35.8*  < > 31.2* 32* 31.7*  --  25.1*  MCV 81.9  < > 81.9  --  82.6  --  82.3  PLT 178  < > 347 342 342  --  278  < > = values in this interval not displayed.  Recent Labs  06/28/16  CHOL 147  LDLCALC 73  TRIG 61   No results found for: [facility] Lab Results  Component Value Date   TSH 1.88 06/28/2016   Lab Results  Component Value Date   HGBA1C 5.4 06/28/2016   Lab Results  Component Value Date   CHOL 147  06/28/2016   HDL 62 06/28/2016   LDLCALC 73 06/28/2016   TRIG 61 06/28/2016   CHOLHDL 5.4 05/22/2015    Significant Diagnostic Results in last 30 days:  Ct Head Wo Contrast  Result Date: 07/17/2016 CLINICAL DATA:  Syncope EXAM: CT HEAD WITHOUT CONTRAST TECHNIQUE: Contiguous axial images were obtained from the base of the skull through the vertex without intravenous contrast. COMPARISON:  CT head 05/24/2016 FINDINGS: Brain: Moderate to advanced atrophy. Chronic microvascular ischemic change in the white matter. Chronic infarct right occipital lobe unchanged. Chronic infarct left frontal operculum and insula. Negative for acute infarct. Negative for hemorrhage. Calcific masses in the right frontal region and left temporal region adjacent to the inner table compatible with calcified meningioma unchanged from the prior study. Vascular: Arterial calcification.  Negative for dense MCA Skull: Negative Sinuses/Orbits: Negative Other: None IMPRESSION: Moderate to advanced atrophy. Extensive chronic ischemic change. No acute abnormality. Electronically Signed   By: Marlan Palau M.D.   On: 07/17/2016 11:08   Nm Pulmonary Perf And Vent  Result Date: 07/17/2016 CLINICAL DATA:  Shortness of breath EXAM: NUCLEAR MEDICINE VENTILATION - PERFUSION LUNG SCAN TECHNIQUE: Ventilation images were obtained in multiple projections using inhaled aerosol Tc-58m DTPA. Perfusion images were obtained in multiple projections after intravenous injection of Tc-70m MAA. RADIOPHARMACEUTICALS:  32.0 mCi Technetium-80m DTPA aerosol inhalation and 4.1 mCi Technetium-29m MAA IV COMPARISON:  None. FINDINGS: Ventilation: No focal ventilation defect. Perfusion: No wedge shaped peripheral perfusion defects to suggest acute pulmonary embolism. IMPRESSION: Very low probability for acute pulmonary embolus. Electronically Signed   By: Signa Kell M.D.   On: 07/17/2016 12:02   Dg Chest Portable 1 View  Result Date: 07/16/2016 CLINICAL  DATA:  Leukocytosis, transient confusion  and dementia. EXAM: PORTABLE CHEST 1 VIEW COMPARISON:  05/25/2016 FINDINGS: There is stable cardiomegaly with aortic atherosclerosis. Lung volumes are slightly low with crowding of interstitial lung markings and mild vascular congestion. No pneumonic consolidation, effusion or pneumothorax. Osteoarthritis of the glenohumeral joints bilaterally. IMPRESSION: Stable cardiomegaly with aortic atherosclerosis. Mild pulmonary vascular congestion without pneumonic consolidations. No acute osseous abnormality. Electronically Signed   By: Tollie Ethavid  Kwon M.D.   On: 07/16/2016 23:30    Assessment and Plan    C DIFF DIARRHEA, 2ND RECURRENCE - PT WILL BE ON VANCOMYCIN po for 8 weeks - 125 mg q6 for 14 days then q12 for 7 days , daily for 7 days then q3 days for 4 weeks    Time spent > 25 min;> 50% of time with patient was spent reviewing records, labs, tests and studies, counseling and developing plan of care  Thurston Holenne D. Lyn HollingsheadAlexander, MD

## 2016-08-19 ENCOUNTER — Encounter: Payer: Self-pay | Admitting: Internal Medicine

## 2016-08-19 NOTE — Progress Notes (Signed)
Location:  [facility] Room Number: [phone] Place of Service:  SNF [phone]  Ariel Goldsmithnne D. Lyn HollingsheadAlexander, MD  Patient Care Team: Pearson GrippeJames Kim, MD as PCP - General (Internal Medicine) Pearson GrippeJames Kim, MD (Internal Medicine)  Extended Emergency Contact Information Primary Emergency Contact: OLIVER,CYNTHIA Home Phone: [phone] Relation: None Secondary Emergency Contact: Hilario Quarryaylor,[name]  [place] Home Phone: [phone] Mobile Phone: [phone] Relation: Daughter    Allergies: Penicillins and Penicillins  Chief Complaint  Patient presents with  . Acute Visit    Acute    HPI: Patient is 82 y.o. female who   Past Medical History:  Diagnosis Date  . AKI (acute kidney injury) (HCC)   . Closed left hip fracture (HCC)   . Coronary artery disease   . Dementia   . Depression with anxiety   . Fall   . HLD (hyperlipidemia)   . Hyperlipidemia   . Hypertension   . Hypokalemia   . Transient ischemic attack (TIA)     Past Surgical History:  Procedure Laterality Date  . ABDOMINAL HYSTERECTOMY    . ANTERIOR APPROACH HEMI HIP ARTHROPLASTY Left 05/25/2016   Procedure: ANTERIOR APPROACH HEMI HIP ARTHROPLASTY;  Surgeon: Samson FredericBrian Swinteck, MD;  Location: [facility];  Service: Orthopedics;  Laterality: Left;    Allergies as of 08/19/2016      Reactions   Penicillins Swelling   Facial and hand swelling per daughter Has patient had a PCN reaction causing immediate rash, facial/tongue/throat swelling, SOB or lightheadedness with hypotension: Yes Has patient had a PCN reaction causing severe rash involving mucus membranes or skin necrosis: No Has patient had a PCN reaction that required hospitalization No Has patient had a PCN reaction occurring within the last 10 years: No If all of the above answers are "NO", then may proceed with Cephalosporin use.   Penicillins Swelling   Has patient had a PCN reaction causing immediate rash, facial/tongue/throat swelling, SOB  or lightheadedness with hypotension: Yes Has patient had a PCN reaction causing severe rash involving mucus membranes or skin necrosis: No Has patient had a PCN reaction that required hospitalization No Has patient had a PCN reaction occurring within the last 10 years: No If all of the above answers are "NO", then may proceed with Cephalosporin use.      Medication List       Accurate as of 08/19/16  1:21 PM. Always use your most recent med list.          aspirin 81 MG EC tablet Take 1 tablet (81 mg total) by mouth 2 (two) times daily with a meal.   bisacodyl 5 MG EC tablet Commonly known as:  DULCOLAX Take 1 tablet (5 mg total) by mouth daily as needed for moderate constipation.   carvedilol 3.125 MG tablet Commonly known as:  COREG Take 1 tablet (3.125 mg total) by mouth 2 (two) times daily with a meal.   ENSURE Take 237 mLs by mouth 3 (three) times daily between meals. Due to weight loss   feeding supplement (ENSURE ENLIVE) Liqd Take 237 mLs by mouth 2 (two) times daily between meals.   feeding supplement (PRO-STAT SUGAR FREE 64) Liqd Take 30 mLs by mouth 2 (two) times daily.   ferrous sulfate 325 (65 FE) MG tablet Take 325 mg by mouth daily with breakfast.   HYDROcodone-acetaminophen 5-325 MG tablet Commonly known as:  NORCO/VICODIN Take 1 tablet by mouth every 8 (eight) hours as needed for moderate pain or severe pain.  LORazepam 0.5 MG tablet Commonly known as:  ATIVAN Take 0.5 mg by mouth 2 (two) times daily as needed for anxiety.   memantine 10 MG tablet Commonly known as:  NAMENDA Take 10 mg by mouth 2 (two) times daily.   mirtazapine 15 MG tablet Commonly known as:  REMERON Take 15 mg by mouth at bedtime.   multivitamin tablet Take 1 tablet by mouth daily.   saccharomyces boulardii 250 MG capsule Commonly known as:  FLORASTOR Take 250 mg by mouth 2 (two) times daily.       No orders of the defined types were placed in this  encounter.   Immunization History  Administered Date(s) Administered  . Influenza,inj,Quad PF,36+ Mos 07/19/2016  . PPD Test 05/28/2016    Social History  Substance Use Topics  . Smoking status: Never Smoker  . Smokeless tobacco: Never Used  . Alcohol use No    Review of Systems  DATA OBTAINED: from patient, nurse, medical record, family member GENERAL:  no fevers, fatigue, appetite changes SKIN: No itching, rash HEENT: No complaint RESPIRATORY: No cough, wheezing, SOB CARDIAC: No chest pain, palpitations, lower extremity edema  GI: No abdominal pain, No N/V/D or constipation, No heartburn or reflux  GU: No dysuria, frequency or urgency, or incontinence  MUSCULOSKELETAL: No unrelieved bone/joint pain NEUROLOGIC: No headache, dizziness  PSYCHIATRIC: No overt anxiety or sadness  Vitals:   08/16/16 1319  BP: (!) 159/85  Pulse: (!) 105  Resp: 18  Temp: 97.4 F (36.3 C)   Body mass index is 16.31 kg/m. Physical Exam  GENERAL APPEARANCE: Alert, conversant, No acute distress  SKIN: No diaphoresis rash HEENT: Unremarkable RESPIRATORY: Breathing is even, unlabored. Lung sounds are clear   CARDIOVASCULAR: Heart RRR no murmurs, rubs or gallops. No peripheral edema  GASTROINTESTINAL: Abdomen is soft, non-tender, not distended w/ normal bowel sounds.  GENITOURINARY: Bladder non tender, not distended  MUSCULOSKELETAL: No abnormal joints or musculature NEUROLOGIC: Cranial nerves 2-12 grossly intact. Moves all extremities PSYCHIATRIC: Mood and affect appropriate to situation, no behavioral issues  Patient Active Problem List   Diagnosis Date Noted  . FTT (failure to thrive) in adult 08/04/2016  . Depression 07/28/2016  . Leukocytosis 07/17/2016  . Pressure injury of skin 07/17/2016  . Malnutrition of moderate degree 07/17/2016  . C. difficile diarrhea 07/07/2016  . Pedal edema 06/27/2016  . Postoperative anemia due to acute blood loss 06/10/2016  . Newly recognized  heart murmur 06/10/2016  . Acute renal failure superimposed on stage 3 chronic kidney disease (HCC) 06/10/2016  . Chronic combined systolic and diastolic CHF (congestive heart failure) (HCC) 05/28/2016  . Protein-calorie malnutrition, severe 05/25/2016  . Left displaced femoral neck fracture (HCC) 05/25/2016  . Fall 05/24/2016  . Hypokalemia 05/24/2016  . AKI (acute kidney injury) (HCC) 05/24/2016  . Hypertensive heart disease with chronic combined systolic and diastolic congestive heart failure (HCC)   . Dementia   . Depression with anxiety   . HLD (hyperlipidemia)   . Dysphagia due to recent stroke 05/31/2015  . Hyperlipidemia 05/30/2015  . Vitamin D deficiency 05/30/2015  . Acute CVA (cerebrovascular accident) (HCC)   . CVA (cerebral vascular accident) (HCC) 05/21/2015  . Chronic kidney disease, stage 3 05/21/2015  . Dementia 05/21/2015  . Hypertension 05/21/2015  . UTI (urinary tract infection) 05/25/2013  . Acute renal failure (HCC) 05/25/2013  . Syncope 05/24/2013    CMP     Component Value Date/Time   NA 142 07/19/2016 0319   NA 142 07/19/2016  K 4.2 07/19/2016 0319   CL 115 (H) 07/19/2016 0319   CO2 21 (L) 07/19/2016 0319   GLUCOSE 87 07/19/2016 0319   BUN 11 07/19/2016 0319   BUN 11 07/19/2016   CREATININE 1.21 (H) 07/19/2016 0319   CALCIUM 8.6 (L) 07/19/2016 0319   PROT 6.2 (L) 07/16/2016 2214   ALBUMIN 2.8 (L) 07/16/2016 2214   AST 19 07/16/2016 2214   ALT 10 (L) 07/16/2016 2214   ALKPHOS 72 07/16/2016 2214   BILITOT 0.9 07/16/2016 2214   GFRNONAA 37 (L) 07/19/2016 0319   GFRAA 43 (L) 07/19/2016 0319    Recent Labs  07/17/16 0215 07/18/16 0420 07/18/16 0430 07/19/16 07/19/16 0319  NA 140 143  --  142 142  K 3.9 3.1*  --   --  4.2  CL 108 115*  --   --  115*  CO2 23 22  --   --  21*  GLUCOSE 136* 91  --   --  87  BUN 18 16  --  11 11  CREATININE 1.41* 1.31*  --  1.2* 1.21*  CALCIUM 9.1 8.5*  --   --  8.6*  MG  --   --  1.8  --   --      Recent Labs  06/28/16 07/16/16 2214  AST 19 19  ALT 14 10*  ALKPHOS 114 72  BILITOT  --  0.9  PROT  --  6.2*  ALBUMIN  --  2.8*    Recent Labs  05/23/16 2336  07/16/16 2214 07/17/16 07/17/16 0215 07/18/16 07/18/16 0420  WBC 10.8*  < > 22.1* 20.2 20.2* 9.6 9.6  NEUTROABS 9.6*  --  19.7*  --   --   --   --   HGB 11.9*  < > 10.0* 10.1* 10.1*  --  8.1*  HCT 35.8*  < > 31.2* 32* 31.7*  --  25.1*  MCV 81.9  < > 81.9  --  82.6  --  82.3  PLT 178  < > 347 342 342  --  278  < > = values in this interval not displayed.  Recent Labs  06/28/16  CHOL 147  LDLCALC 73  TRIG 61   No results found for: [facility] Lab Results  Component Value Date   TSH 1.88 06/28/2016   Lab Results  Component Value Date   HGBA1C 5.4 06/28/2016   Lab Results  Component Value Date   CHOL 147 06/28/2016   HDL 62 06/28/2016   LDLCALC 73 06/28/2016   TRIG 61 06/28/2016   CHOLHDL 5.4 05/22/2015    Significant Diagnostic Results in last 30 days:  No results found.  Assessment and Plan  No problem-specific Assessment & Plan notes found for this encounter.   Labs/tests ordered:    Ariel GoldsmithAnne D. Lyn HollingsheadAlexander, MD

## 2016-08-22 ENCOUNTER — Encounter: Payer: Self-pay | Admitting: Internal Medicine

## 2016-08-22 ENCOUNTER — Non-Acute Institutional Stay (SKILLED_NURSING_FACILITY): Payer: Medicare Other | Admitting: Internal Medicine

## 2016-08-22 DIAGNOSIS — N183 Chronic kidney disease, stage 3 unspecified: Secondary | ICD-10-CM

## 2016-08-22 DIAGNOSIS — D62 Acute posthemorrhagic anemia: Secondary | ICD-10-CM | POA: Diagnosis not present

## 2016-08-22 DIAGNOSIS — E785 Hyperlipidemia, unspecified: Secondary | ICD-10-CM | POA: Diagnosis not present

## 2016-08-22 NOTE — Progress Notes (Signed)
[facility] Room Number: [phone] Place of Service:  SNF [phone]  [name], MD  Patient Care Team: Pearson GrippeJames Kim, MD as PCP - General (Internal Medicine) Pearson GrippeJames Kim, MD (Internal Medicine)  Extended Emergency Contact Information Primary Emergency Contact: OLIVER,CYNTHIA Home Phone: [phone] Relation: None Secondary Emergency Contact: Hilario Quarryaylor,[name]  [place] Home Phone: [phone] Mobile Phone: [phone] Relation: Daughter    Allergies: Penicillins and Penicillins  Chief Complaint  Patient presents with  . Medical Management of Chronic Issues    Routine Visit    HPI: Patient is 82 y.o. female who is being seen for routine issues of CKD3, HLD,anemia.  Past Medical History:  Diagnosis Date  . AKI (acute kidney injury) (HCC)   . Closed left hip fracture (HCC)   . Coronary artery disease   . Dementia   . Depression with anxiety   . Fall   . HLD (hyperlipidemia)   . Hyperlipidemia   . Hypertension   . Hypokalemia   . Transient ischemic attack (TIA)     Past Surgical History:  Procedure Laterality Date  . ABDOMINAL HYSTERECTOMY    . ANTERIOR APPROACH HEMI HIP ARTHROPLASTY Left 05/25/2016   Procedure: ANTERIOR APPROACH HEMI HIP ARTHROPLASTY;  Surgeon: Samson FredericBrian Swinteck, MD;  Location: [facility];  Service: Orthopedics;  Laterality: Left;    Allergies as of 08/22/2016      Reactions   Penicillins Swelling   Facial and hand swelling per daughter Has patient had a PCN reaction causing immediate rash, facial/tongue/throat swelling, SOB or lightheadedness with hypotension: Yes Has patient had a PCN reaction causing severe rash involving mucus membranes or skin necrosis: No Has patient had a PCN reaction that required hospitalization No Has patient had a PCN reaction occurring within the last 10 years: No If all of the above answers are "NO", then may proceed with Cephalosporin use.   Penicillins Swelling   Has  patient had a PCN reaction causing immediate rash, facial/tongue/throat swelling, SOB or lightheadedness with hypotension: Yes Has patient had a PCN reaction causing severe rash involving mucus membranes or skin necrosis: No Has patient had a PCN reaction that required hospitalization No Has patient had a PCN reaction occurring within the last 10 years: No If all of the above answers are "NO", then may proceed with Cephalosporin use.      Medication List       Accurate as of 08/22/16 11:59 PM. Always use your most recent med list.          aspirin 81 MG EC tablet Take 1 tablet (81 mg total) by mouth 2 (two) times daily with a meal.   bisacodyl 5 MG EC tablet Commonly known as:  DULCOLAX Take 1 tablet (5 mg total) by mouth daily as needed for moderate constipation.   carvedilol 3.125 MG tablet Commonly known as:  COREG Take 1 tablet (3.125 mg total) by mouth 2 (two) times daily with a meal.   ENSURE Take 237 mLs by mouth 3 (three) times daily between meals. Due to weight loss   feeding supplement (ENSURE ENLIVE) Liqd Take 237 mLs by mouth 2 (two) times daily between meals.   feeding supplement (PRO-STAT SUGAR FREE 64) Liqd Take 30 mLs by mouth 2 (two) times daily.   ferrous sulfate 325 (65 FE) MG tablet Take 325 mg by mouth daily with breakfast.   HYDROcodone-acetaminophen 5-325 MG tablet Commonly known as:  NORCO/VICODIN Take 1 tablet by mouth every 8 (  eight) hours as needed for moderate pain or severe pain.   LORazepam 0.5 MG tablet Commonly known as:  ATIVAN Take 0.5 mg by mouth 2 (two) times daily as needed for anxiety.   memantine 10 MG tablet Commonly known as:  NAMENDA Take 10 mg by mouth 2 (two) times daily.   mirtazapine 15 MG tablet Commonly known as:  REMERON Take 15 mg by mouth at bedtime.   multivitamin tablet Take 1 tablet by mouth daily.   saccharomyces boulardii 250 MG capsule Commonly known as:  FLORASTOR Take 250 mg by mouth 2 (two) times  daily.       No orders of the defined types were placed in this encounter.   Immunization History  Administered Date(s) Administered  . Influenza,inj,Quad PF,36+ Mos 07/19/2016  . PPD Test 05/28/2016    Social History  Substance Use Topics  . Smoking status: Never Smoker  . Smokeless tobacco: Never Used  . Alcohol use No    Review of Systems  UTO 2/2 dementia   Vitals:   08/22/16 1034  BP: (!) 159/85  Pulse: 62  Resp: 20  Temp: 97.4 F (36.3 C)   Body mass index is 16.31 kg/m. Physical Exam  GENERAL APPEARANCE: Alert, min conversant, No acute distress ; frail BF SKIN: No diaphoresis rash HEENT: Unremarkable RESPIRATORY: Breathing is even, unlabored. Lung sounds are clear   CARDIOVASCULAR: Heart RRR no murmurs, rubs or gallops. No peripheral edema  GASTROINTESTINAL: Abdomen is soft, non-tender, not distended w/ normal bowel sounds.  GENITOURINARY: Bladder non tender, not distended  MUSCULOSKELETAL: No abnormal joints or musculature NEUROLOGIC: Cranial nerves 2-12 grossly intact. Moves all extremities PSYCHIATRIC: Mood and affect quiet, with dementia, no behavioral issues  Patient Active Problem List   Diagnosis Date Noted  . FTT (failure to thrive) in adult 08/04/2016  . Depression 07/28/2016  . Leukocytosis 07/17/2016  . Pressure injury of skin 07/17/2016  . Malnutrition of moderate degree 07/17/2016  . C. difficile diarrhea 07/07/2016  . Pedal edema 06/27/2016  . Postoperative anemia due to acute blood loss 06/10/2016  . Newly recognized heart murmur 06/10/2016  . Acute renal failure superimposed on stage 3 chronic kidney disease (HCC) 06/10/2016  . Chronic combined systolic and diastolic CHF (congestive heart failure) (HCC) 05/28/2016  . Protein-calorie malnutrition, severe 05/25/2016  . Left displaced femoral neck fracture (HCC) 05/25/2016  . Fall 05/24/2016  . Hypokalemia 05/24/2016  . AKI (acute kidney injury) (HCC) 05/24/2016  . Hypertensive  heart disease with chronic combined systolic and diastolic congestive heart failure (HCC)   . Dementia   . Depression with anxiety   . HLD (hyperlipidemia)   . Dysphagia due to recent stroke 05/31/2015  . Hyperlipidemia 05/30/2015  . Vitamin D deficiency 05/30/2015  . Acute CVA (cerebrovascular accident) (HCC)   . CVA (cerebral vascular accident) (HCC) 05/21/2015  . CKD (chronic kidney disease) stage 3, GFR 30-59 ml/min 05/21/2015  . Dementia 05/21/2015  . Hypertension 05/21/2015  . UTI (urinary tract infection) 05/25/2013  . Acute renal failure (HCC) 05/25/2013  . Syncope 05/24/2013    CMP     Component Value Date/Time   NA 142 07/19/2016 0319   NA 142 07/19/2016   K 4.2 07/19/2016 0319   CL 115 (H) 07/19/2016 0319   CO2 21 (L) 07/19/2016 0319   GLUCOSE 87 07/19/2016 0319   BUN 11 07/19/2016 0319   BUN 11 07/19/2016   CREATININE 1.21 (H) 07/19/2016 0319   CALCIUM 8.6 (L) 07/19/2016 0319  PROT 6.2 (L) 07/16/2016 2214   ALBUMIN 2.8 (L) 07/16/2016 2214   AST 19 07/16/2016 2214   ALT 10 (L) 07/16/2016 2214   ALKPHOS 72 07/16/2016 2214   BILITOT 0.9 07/16/2016 2214   GFRNONAA 37 (L) 07/19/2016 0319   GFRAA 43 (L) 07/19/2016 0319    Recent Labs  07/17/16 0215 07/18/16 0420 07/18/16 0430 07/19/16 07/19/16 0319  NA 140 143  --  142 142  K 3.9 3.1*  --   --  4.2  CL 108 115*  --   --  115*  CO2 23 22  --   --  21*  GLUCOSE 136* 91  --   --  87  BUN 18 16  --  11 11  CREATININE 1.41* 1.31*  --  1.2* 1.21*  CALCIUM 9.1 8.5*  --   --  8.6*  MG  --   --  1.8  --   --     Recent Labs  06/28/16 07/16/16 2214  AST 19 19  ALT 14 10*  ALKPHOS 114 72  BILITOT  --  0.9  PROT  --  6.2*  ALBUMIN  --  2.8*    Recent Labs  05/23/16 2336  07/16/16 2214 07/17/16 07/17/16 0215 07/18/16 07/18/16 0420  WBC 10.8*  < > 22.1* 20.2 20.2* 9.6 9.6  NEUTROABS 9.6*  --  19.7*  --   --   --   --   HGB 11.9*  < > 10.0* 10.1* 10.1*  --  8.1*  HCT 35.8*  < > 31.2* 32* 31.7*  --   25.1*  MCV 81.9  < > 81.9  --  82.6  --  82.3  PLT 178  < > 347 342 342  --  278  < > = values in this interval not displayed.  Recent Labs  06/28/16  CHOL 147  LDLCALC 73  TRIG 61   [facility]: [facility] Lab Results  Component Value Date   TSH 1.88 06/28/2016   Lab Results  Component Value Date   HGBA1C 5.4 06/28/2016   Lab Results  Component Value Date   CHOL 147 06/28/2016   HDL 62 06/28/2016   LDLCALC 73 06/28/2016   TRIG 61 06/28/2016   CHOLHDL 5.4 05/22/2015    Significant Diagnostic Results in last 30 days:  No results found.  Assessment and Plan  CKD (chronic kidney disease) stage 3, GFR 30-59 ml/min GFR 43 wirh Cr 1.21 which is stable; will cont to monitor at intervals  HLD (hyperlipidemia) LDL is 73, HDL 62 on no meds at all; excellent ; will monitor at intervals  Postoperative anemia due to acute blood loss Most recent Hb 8.1; MCV is 82, ; will cont iron daily; pt has been sick with c diff basically since surgery; will cont to monitor    Kattia Selley D. Lyn HollingsheadAlexander, MD

## 2016-09-04 ENCOUNTER — Encounter: Payer: Self-pay | Admitting: Internal Medicine

## 2016-09-04 NOTE — Progress Notes (Signed)
This encounter was created in error - please disregard.

## 2016-09-07 ENCOUNTER — Encounter: Payer: Self-pay | Admitting: Internal Medicine

## 2016-09-07 NOTE — Assessment & Plan Note (Signed)
Most recent Hb 8.1; MCV is 82, ; will cont iron daily; pt has been sick with c diff basically since surgery; will cont to monitor

## 2016-09-07 NOTE — Assessment & Plan Note (Signed)
LDL is 73, HDL 62 on no meds at all; excellent ; will monitor at intervals

## 2016-09-07 NOTE — Assessment & Plan Note (Signed)
GFR 43 wirh Cr 1.21 which is stable; will cont to monitor at intervals

## 2016-09-08 NOTE — Progress Notes (Signed)
This encounter was created in error - please disregard.

## 2016-09-18 LAB — HEPATIC FUNCTION PANEL
ALK PHOS: 79 U/L (ref 25–125)
ALT: 13 U/L (ref 7–35)
AST: 15 U/L (ref 13–35)
BILIRUBIN, TOTAL: 0.3 mg/dL

## 2016-09-18 LAB — BASIC METABOLIC PANEL
BUN: 25 mg/dL — AB (ref 4–21)
CREATININE: 1.3 mg/dL — AB (ref 0.5–1.1)
Glucose: 102 mg/dL
Potassium: 4.7 mmol/L (ref 3.4–5.3)
SODIUM: 139 mmol/L (ref 137–147)

## 2016-09-18 LAB — CBC AND DIFFERENTIAL
HCT: 36 % (ref 36–46)
Hemoglobin: 11 g/dL — AB (ref 12.0–16.0)
Platelets: 217 10*3/uL (ref 150–399)
WBC: 9.8 10^3/mL

## 2016-09-27 ENCOUNTER — Encounter: Payer: Self-pay | Admitting: Internal Medicine

## 2016-09-27 ENCOUNTER — Non-Acute Institutional Stay (SKILLED_NURSING_FACILITY): Payer: Medicare Other | Admitting: Internal Medicine

## 2016-09-27 DIAGNOSIS — I110 Hypertensive heart disease with heart failure: Secondary | ICD-10-CM | POA: Diagnosis not present

## 2016-09-27 DIAGNOSIS — I5042 Chronic combined systolic (congestive) and diastolic (congestive) heart failure: Secondary | ICD-10-CM | POA: Diagnosis not present

## 2016-09-27 DIAGNOSIS — A0472 Enterocolitis due to Clostridium difficile, not specified as recurrent: Secondary | ICD-10-CM | POA: Diagnosis not present

## 2016-09-27 NOTE — Progress Notes (Signed)
[facility] Room Number: [phone] Place of Service:  SNF [phone]  [name], MD  Patient Care Team: Pearson GrippeJames Kim, MD as PCP - General (Internal Medicine) Pearson GrippeJames Kim, MD (Internal Medicine)  Extended Emergency Contact Information Primary Emergency Contact: OLIVER,CYNTHIA Home Phone: [phone] Relation: None Secondary Emergency Contact: Hilario Quarryaylor,[name]  [place] Home Phone: [phone] Mobile Phone: [phone] Relation: Daughter    Allergies: Penicillins and Penicillins  Chief Complaint  Patient presents with  . Medical Management of Chronic Issues    Routine Visit    HPI: Patient is 83 y.o. female who is being seen for routine issues of HTN, combined CHF and C Diff diarrhea.  Past Medical History:  Diagnosis Date  . AKI (acute kidney injury) (HCC)   . Closed left hip fracture (HCC)   . Coronary artery disease   . Dementia   . Depression with anxiety   . Fall   . HLD (hyperlipidemia)   . Hyperlipidemia   . Hypertension   . Hypokalemia   . Transient ischemic attack (TIA)     Past Surgical History:  Procedure Laterality Date  . ABDOMINAL HYSTERECTOMY    . ANTERIOR APPROACH HEMI HIP ARTHROPLASTY Left 05/25/2016   Procedure: ANTERIOR APPROACH HEMI HIP ARTHROPLASTY;  Surgeon: Samson FredericBrian Swinteck, MD;  Location: [facility];  Service: Orthopedics;  Laterality: Left;    Allergies as of 09/27/2016      Reactions   Penicillins Swelling   Facial and hand swelling per daughter Has patient had a PCN reaction causing immediate rash, facial/tongue/throat swelling, SOB or lightheadedness with hypotension: Yes Has patient had a PCN reaction causing severe rash involving mucus membranes or skin necrosis: No Has patient had a PCN reaction that required hospitalization No Has patient had a PCN reaction occurring within the last 10 years: No If all of the above answers are "NO", then may proceed with Cephalosporin use.   Penicillins Swelling   Has patient had a PCN reaction causing immediate rash, facial/tongue/throat swelling, SOB or lightheadedness with hypotension: Yes Has patient had a PCN reaction causing severe rash involving mucus membranes or skin necrosis: No Has patient had a PCN reaction that required hospitalization No Has patient had a PCN reaction occurring within the last 10 years: No If all of the above answers are "NO", then may proceed with Cephalosporin use.      Medication List       Accurate as of 09/27/16 11:59 PM. Always use your most recent med list.          aspirin 81 MG EC tablet Take 1 tablet (81 mg total) by mouth 2 (two) times daily with a meal.   bisacodyl 5 MG EC tablet Commonly known as:  DULCOLAX Take 1 tablet (5 mg total) by mouth daily as needed for moderate constipation.   carvedilol 3.125 MG tablet Commonly known as:  COREG Take 1 tablet (3.125 mg total) by mouth 2 (two) times daily with a meal.   feeding supplement (PRO-STAT SUGAR FREE 64) Liqd Take 30 mLs by mouth 2 (two) times daily.   feeding supplement Liqd Take 1 Container by mouth 2 (two) times daily between meals.   ferrous sulfate 325 (65 FE) MG tablet Take 325 mg by mouth daily with breakfast.   HYDROcodone-acetaminophen 5-325 MG tablet Commonly known as:  NORCO/VICODIN Take 1 tablet by mouth every 8 (eight) hours as needed for moderate pain or severe pain.   memantine 10 MG tablet  Commonly known as:  NAMENDA Take 10 mg by mouth 2 (two) times daily.   mirtazapine 15 MG tablet Commonly known as:  REMERON Take 15 mg by mouth at bedtime.   multivitamin tablet Take 1 tablet by mouth daily.   saccharomyces boulardii 250 MG capsule Commonly known as:  FLORASTOR Take 250 mg by mouth 2 (two) times daily.   UNABLE TO FIND Med Name: Magic Cup Give 1 cup by mouth 2 times daily with lunch and dinner due to weight loss   vancomycin 50 mg/mL oral solution Commonly known as:  VANCOCIN Take  125 mg by mouth. Starting on 09/14/16 take every 3rd day of week for 4 weeks.       No orders of the defined types were placed in this encounter.   Immunization History  Administered Date(s) Administered  . Influenza,inj,Quad PF,36+ Mos 07/19/2016  . PPD Test 05/28/2016    Social History  Substance Use Topics  . Smoking status: Never Smoker  . Smokeless tobacco: Never Used  . Alcohol use No    Review of Systems  UTO 2/2 dementia   Vitals:   09/27/16 0834  BP: (!) 159/85  Pulse: 73  Resp: (!) 21  Temp: 97.4 F (36.3 C)   Body mass index is 16.84 kg/m. Physical Exam  GENERAL APPEARANCE: Alert, min conversant, No acute distress  SKIN: No diaphoresis rash HEENT: Unremarkable RESPIRATORY: Breathing is even, unlabored. Lung sounds are clear   CARDIOVASCULAR: Heart RRR no murmurs, rubs or gallops. No peripheral edema  GASTROINTESTINAL: Abdomen is soft, non-tender, not distended w/ normal bowel sounds.  GENITOURINARY: Bladder non tender, not distended  MUSCULOSKELETAL: No abnormal joints or musculature; thin NEUROLOGIC: Cranial nerves 2-12 grossly intact. Moves all extremities PSYCHIATRIC: Mood and affect appropriate to situation with dementia, no behavioral issues  Patient Active Problem List   Diagnosis Date Noted  . FTT (failure to thrive) in adult 08/04/2016  . Depression 07/28/2016  . Leukocytosis 07/17/2016  . Pressure injury of skin 07/17/2016  . Malnutrition of moderate degree 07/17/2016  . C. difficile diarrhea 07/07/2016  . Pedal edema 06/27/2016  . Postoperative anemia due to acute blood loss 06/10/2016  . Newly recognized heart murmur 06/10/2016  . Acute renal failure superimposed on stage 3 chronic kidney disease (HCC) 06/10/2016  . Chronic combined systolic and diastolic CHF (congestive heart failure) (HCC) 05/28/2016  . Protein-calorie malnutrition, severe 05/25/2016  . Left displaced femoral neck fracture (HCC) 05/25/2016  . Fall 05/24/2016  .  Hypokalemia 05/24/2016  . AKI (acute kidney injury) (HCC) 05/24/2016  . Hypertensive heart disease with chronic combined systolic and diastolic congestive heart failure (HCC)   . Dementia   . Depression with anxiety   . HLD (hyperlipidemia)   . Dysphagia due to recent stroke 05/31/2015  . Hyperlipidemia 05/30/2015  . Vitamin D deficiency 05/30/2015  . Acute CVA (cerebrovascular accident) (HCC)   . CVA (cerebral vascular accident) (HCC) 05/21/2015  . CKD (chronic kidney disease) stage 3, GFR 30-59 ml/min 05/21/2015  . Dementia 05/21/2015  . Hypertension 05/21/2015  . UTI (urinary tract infection) 05/25/2013  . Acute renal failure (HCC) 05/25/2013  . Syncope 05/24/2013    CMP     Component Value Date/Time   NA 142 07/19/2016 0319   NA 142 07/19/2016   K 4.2 07/19/2016 0319   CL 115 (H) 07/19/2016 0319   CO2 21 (L) 07/19/2016 0319   GLUCOSE 87 07/19/2016 0319   BUN 11 07/19/2016 0319   BUN 11 07/19/2016  CREATININE 1.21 (H) 07/19/2016 0319   CALCIUM 8.6 (L) 07/19/2016 0319   PROT 6.2 (L) 07/16/2016 2214   ALBUMIN 2.8 (L) 07/16/2016 2214   AST 19 07/16/2016 2214   ALT 10 (L) 07/16/2016 2214   ALKPHOS 72 07/16/2016 2214   BILITOT 0.9 07/16/2016 2214   GFRNONAA 37 (L) 07/19/2016 0319   GFRAA 43 (L) 07/19/2016 0319    Recent Labs  07/17/16 0215 07/18/16 0420 07/18/16 0430 07/19/16 07/19/16 0319  NA 140 143  --  142 142  K 3.9 3.1*  --   --  4.2  CL 108 115*  --   --  115*  CO2 23 22  --   --  21*  GLUCOSE 136* 91  --   --  87  BUN 18 16  --  11 11  CREATININE 1.41* 1.31*  --  1.2* 1.21*  CALCIUM 9.1 8.5*  --   --  8.6*  MG  --   --  1.8  --   --     Recent Labs  06/28/16 07/16/16 2214  AST 19 19  ALT 14 10*  ALKPHOS 114 72  BILITOT  --  0.9  PROT  --  6.2*  ALBUMIN  --  2.8*    Recent Labs  05/23/16 2336  07/16/16 2214 07/17/16 07/17/16 0215 07/18/16 07/18/16 0420  WBC 10.8*  < > 22.1* 20.2 20.2* 9.6 9.6  NEUTROABS 9.6*  --  19.7*  --   --   --    --   HGB 11.9*  < > 10.0* 10.1* 10.1*  --  8.1*  HCT 35.8*  < > 31.2* 32* 31.7*  --  25.1*  MCV 81.9  < > 81.9  --  82.6  --  82.3  PLT 178  < > 347 342 342  --  278  < > = values in this interval not displayed.  Recent Labs  06/28/16  CHOL 147  LDLCALC 73  TRIG 61   No results found for: [facility] Lab Results  Component Value Date   TSH 1.88 06/28/2016   Lab Results  Component Value Date   HGBA1C 5.4 06/28/2016   Lab Results  Component Value Date   CHOL 147 06/28/2016   HDL 62 06/28/2016   LDLCALC 73 06/28/2016   TRIG 61 06/28/2016   CHOLHDL 5.4 05/22/2015    Significant Diagnostic Results in last 30 days:  No results found.  Assessment and Plan  Hypertensive heart disease with chronic combined systolic and diastolic congestive heart failure (HCC) Reasonably controlled on coreg 3.125 gmg BID;considered increasing to 6.25 mg BID but decided at her age BP is acceptable  C. difficile diarrhea Pt improved; should be finished/finishing her 6 weeks of vancomycin for recurrent C Diff.  Chronic combined systolic and diastolic CHF (congestive heart failure) (HCC) No exacerbations, conrolled without diuretics so far; plan to cont coreg 3.125 mg BID     Deaveon Schoen D. [name], MD

## 2016-10-02 ENCOUNTER — Non-Acute Institutional Stay (SKILLED_NURSING_FACILITY): Payer: Medicare Other | Admitting: Internal Medicine

## 2016-10-02 DIAGNOSIS — Z20828 Contact with and (suspected) exposure to other viral communicable diseases: Secondary | ICD-10-CM

## 2016-10-16 ENCOUNTER — Encounter: Payer: Self-pay | Admitting: Internal Medicine

## 2016-10-16 NOTE — Assessment & Plan Note (Signed)
No exacerbations, conrolled without diuretics so far; plan to cont coreg 3.125 mg BID

## 2016-10-16 NOTE — Assessment & Plan Note (Signed)
Pt improved; should be finished/finishing her 6 weeks of vancomycin for recurrent C Diff.

## 2016-10-16 NOTE — Assessment & Plan Note (Addendum)
Reasonably controlled on coreg 3.125 gmg BID;considered increasing to 6.25 mg BID but decided at her age BP is acceptable

## 2016-10-25 ENCOUNTER — Non-Acute Institutional Stay (SKILLED_NURSING_FACILITY): Payer: Medicare Other | Admitting: Internal Medicine

## 2016-10-25 ENCOUNTER — Encounter: Payer: Self-pay | Admitting: Internal Medicine

## 2016-10-25 DIAGNOSIS — I10 Essential (primary) hypertension: Secondary | ICD-10-CM

## 2016-10-25 DIAGNOSIS — Z8673 Personal history of transient ischemic attack (TIA), and cerebral infarction without residual deficits: Secondary | ICD-10-CM

## 2016-10-25 DIAGNOSIS — E785 Hyperlipidemia, unspecified: Secondary | ICD-10-CM | POA: Diagnosis not present

## 2016-10-25 NOTE — Progress Notes (Signed)
Location:  [facility] Room Number: [phone] Place of Service:  SNF [phone])   Ariel Douglas. [name], MD  Patient Care Team: [name], MD as PCP - General (Internal Medicine) [name], MD (Internal Medicine)  Extended Emergency Contact Information Primary Emergency Contact: [facility] Address: [address]          [place], [place] [address] [place] Home Phone: [phone] Mobile Phone: [phone] Relation: Daughter Secondary Emergency Contact: Asa Saunas [place] Home Phone: [phone] Relation: Grandaughter    Allergies: Penicillins and Penicillins  Chief Complaint  Patient presents with  . Medical Management of Chronic Issues    Routine Visit    HPI: Patient is 83 y.o. female who is being seen for routine issues of hx stroke, HDL and HTN.  Past Medical History:  Diagnosis Date  . AKI (acute kidney injury) (HCC)   . Closed left hip fracture (HCC)   . Coronary artery disease   . Dementia   . Depression with anxiety   . Fall   . HLD (hyperlipidemia)   . Hyperlipidemia   . Hypertension   . Hypokalemia   . Transient ischemic attack (TIA)     Past Surgical History:  Procedure Laterality Date  . ABDOMINAL HYSTERECTOMY    . ANTERIOR APPROACH HEMI HIP ARTHROPLASTY Left 05/25/2016   Procedure: ANTERIOR APPROACH HEMI HIP ARTHROPLASTY;  Surgeon: Samson Frederic, MD;  Location: [facility];  Service: Orthopedics;  Laterality: Left;    Allergies as of 10/25/2016      Reactions   Penicillins Swelling   Facial and hand swelling per daughter Has patient had a PCN reaction causing immediate rash, facial/tongue/throat swelling, SOB or lightheadedness with hypotension: Yes Has patient had a PCN reaction causing severe rash involving mucus membranes or skin necrosis: No Has patient had a PCN reaction that required hospitalization No Has patient had a PCN reaction occurring within the last 10 years:  No If all of the above answers are "NO", then may proceed with Cephalosporin use.   Penicillins Swelling   Has patient had a PCN reaction causing immediate rash, facial/tongue/throat swelling, SOB or lightheadedness with hypotension: Yes Has patient had a PCN reaction causing severe rash involving mucus membranes or skin necrosis: No Has patient had a PCN reaction that required hospitalization No Has patient had a PCN reaction occurring within the last 10 years: No If all of the above answers are "NO", then may proceed with Cephalosporin use.      Medication List       Accurate as of 10/25/16 11:59 PM. Always use your most recent med list.          aspirin 81 MG EC tablet Take 1 tablet (81 mg total) by mouth 2 (two) times daily with a meal.   bisacodyl 5 MG EC tablet Commonly known as:  DULCOLAX Take 1 tablet (5 mg total) by mouth daily as needed for moderate constipation.   carvedilol 3.125 MG tablet Commonly known as:  COREG Take 1 tablet (3.125 mg total) by mouth 2 (two) times daily with a meal.   feeding supplement (PRO-STAT SUGAR FREE 64) Liqd Take 30 mLs by mouth 2 (two) times daily.   feeding supplement Liqd Take 1 Container by mouth 2 (two) times daily between meals.   ferrous sulfate 325 (65 FE) MG tablet Take 325 mg by mouth daily with breakfast.   HYDROcodone-acetaminophen 5-325 MG tablet Commonly known as:  NORCO/VICODIN Take  1 tablet by mouth every 8 (eight) hours as needed for moderate pain or severe pain.   memantine 10 MG tablet Commonly known as:  NAMENDA Take 10 mg by mouth 2 (two) times daily.   mirtazapine 15 MG tablet Commonly known as:  REMERON Take 15 mg by mouth at bedtime.   multivitamin tablet Take 1 tablet by mouth daily.   saccharomyces boulardii 250 MG capsule Commonly known as:  FLORASTOR Take 250 mg by mouth 2 (two) times daily.   UNABLE TO FIND Med Name: Magic Cup Give 1 cup by mouth 2 times daily with lunch and dinner due to  weight loss   vancomycin 50 mg/mL oral solution Commonly known as:  VANCOCIN Take 125 mg by mouth. Starting on 09/14/16 take every 3rd day of week for 4 weeks.       No orders of the defined types were placed in this encounter.   Immunization History  Administered Date(s) Administered  . Influenza,inj,Quad PF,36+ Mos 07/19/2016  . PPD Test 05/28/2016    Social History  Substance Use Topics  . Smoking status: Never Smoker  . Smokeless tobacco: Never Used  . Alcohol use No    Review of Systems  UTO 2/2 dementia; nursing without concerns    Vitals:   10/25/16 0908  BP: (!) 159/85  Pulse: 60  Resp: 20  Temp: 99.4 F (37.4 C)   Body mass index is 16.67 kg/m. Physical Exam  GENERAL APPEARANCE: Alert, min,conversant, No acute distress  SKIN: No diaphoresis rash HEENT: Unremarkable RESPIRATORY: Breathing is even, unlabored. Lung sounds are clear   CARDIOVASCULAR: Heart RRR no murmurs, rubs or gallops. No peripheral edema  GASTROINTESTINAL: Abdomen is soft, non-tender, not distended w/ normal bowel sounds.  GENITOURINARY: Bladder non tender, not distended  MUSCULOSKELETAL: No abnormal joints or musculature except thin NEUROLOGIC: Cranial nerves 2-12 grossly intact. Moves all extremities PSYCHIATRIC: Mood and affect with dementia, no behavioral issues  Patient Active Problem List   Diagnosis Date Noted  . FTT (failure to thrive) in adult 08/04/2016  . Depression 07/28/2016  . Leukocytosis 07/17/2016  . Pressure injury of skin 07/17/2016  . Malnutrition of moderate degree 07/17/2016  . C. difficile diarrhea 07/07/2016  . Pedal edema 06/27/2016  . Postoperative anemia due to acute blood loss 06/10/2016  . Newly recognized heart murmur 06/10/2016  . Acute renal failure superimposed on stage 3 chronic kidney disease (HCC) 06/10/2016  . Chronic combined systolic and diastolic CHF (congestive heart failure) (HCC) 05/28/2016  . Protein-calorie malnutrition, severe  05/25/2016  . Left displaced femoral neck fracture (HCC) 05/25/2016  . Fall 05/24/2016  . Hypokalemia 05/24/2016  . AKI (acute kidney injury) (HCC) 05/24/2016  . Hypertensive heart disease with chronic combined systolic and diastolic congestive heart failure (HCC)   . Dementia   . Depression with anxiety   . HLD (hyperlipidemia)   . Dysphagia due to recent stroke 05/31/2015  . Hyperlipidemia 05/30/2015  . Vitamin D deficiency 05/30/2015  . Acute CVA (cerebrovascular accident) (HCC)   . Hx of arterial ischemic stroke 05/21/2015  . CKD (chronic kidney disease) stage 3, GFR 30-59 ml/min 05/21/2015  . Dementia 05/21/2015  . Hypertension 05/21/2015  . UTI (urinary tract infection) 05/25/2013  . Acute renal failure (HCC) 05/25/2013  . Syncope 05/24/2013    CMP     Component Value Date/Time   NA 139 09/18/2016   K 4.7 09/18/2016   CL 115 (H) 07/19/2016 0319   CO2 21 (L) 07/19/2016 0319  GLUCOSE 87 07/19/2016 0319   BUN 25 (A) 09/18/2016   CREATININE 1.3 (A) 09/18/2016   CREATININE 1.21 (H) 07/19/2016 0319   CALCIUM 8.6 (L) 07/19/2016 0319   PROT 6.2 (L) 07/16/2016 2214   ALBUMIN 2.8 (L) 07/16/2016 2214   AST 15 09/18/2016   ALT 13 09/18/2016   ALKPHOS 79 09/18/2016   BILITOT 0.9 07/16/2016 2214   GFRNONAA 37 (L) 07/19/2016 0319   GFRAA 43 (L) 07/19/2016 0319    Recent Labs  07/17/16 0215 07/18/16 0420 07/18/16 0430 07/19/16 07/19/16 0319 09/18/16  NA 140 143  --  142 142 139  K 3.9 3.1*  --   --  4.2 4.7  CL 108 115*  --   --  115*  --   CO2 23 22  --   --  21*  --   GLUCOSE 136* 91  --   --  87  --   BUN 18 16  --  11 11 25*  CREATININE 1.41* 1.31*  --  1.2* 1.21* 1.3*  CALCIUM 9.1 8.5*  --   --  8.6*  --   MG  --   --  1.8  --   --   --     Recent Labs  06/28/16 07/16/16 2214 09/18/16  AST 19 19 15   ALT 14 10* 13  ALKPHOS 114 72 79  BILITOT  --  0.9  --   PROT  --  6.2*  --   ALBUMIN  --  2.8*  --     Recent Labs  05/23/16 2336  07/16/16 2214   07/17/16 0215 07/18/16 07/18/16 0420 09/18/16  WBC 10.8*  < > 22.1*  < > 20.2* 9.6 9.6 9.8  NEUTROABS 9.6*  --  19.7*  --   --   --   --   --   HGB 11.9*  < > 10.0*  < > 10.1*  --  8.1* 11.0*  HCT 35.8*  < > 31.2*  < > 31.7*  --  25.1* 36  MCV 81.9  < > 81.9  --  82.6  --  82.3  --   PLT 178  < > 347  < > 342  --  278 217  < > = values in this interval not displayed.  Recent Labs  06/28/16  CHOL 147  LDLCALC 73  TRIG 61   No results found for: [facility] Lab Results  Component Value Date   TSH 1.88 06/28/2016   Lab Results  Component Value Date   HGBA1C 5.4 06/28/2016   Lab Results  Component Value Date   CHOL 147 06/28/2016   HDL 62 06/28/2016   LDLCALC 73 06/28/2016   TRIG 61 06/28/2016   CHOLHDL 5.4 05/22/2015    Significant Diagnostic Results in last 30 days:  No results found.  Assessment and Plan  Hx of arterial ischemic stroke Table ; cont ASA 81 mg BID as prophylaxis  Hyperlipidemia LDL - 73;HDL - 62 on no meds;will monitor at intervals  Hypertension Controlled for age;cont coreg 3.125 mg BID    Ariel GoldsmithAnne D. Lyn HollingsheadAlexander, MD

## 2016-10-28 ENCOUNTER — Non-Acute Institutional Stay (SKILLED_NURSING_FACILITY): Payer: Medicare Other | Admitting: Internal Medicine

## 2016-10-28 DIAGNOSIS — Z221 Carrier of other intestinal infectious diseases: Secondary | ICD-10-CM

## 2016-10-29 ENCOUNTER — Encounter: Payer: Self-pay | Admitting: Internal Medicine

## 2016-10-29 NOTE — Progress Notes (Signed)
[facility] Room Number: [phone] Place of Service:  SNF [phone])  Ariel Goldsmithnne D. Lyn HollingsheadAlexander, MD  Patient Care Team: Pearson GrippeJames Kim, MD as PCP - General (Internal Medicine) Pearson GrippeJames Kim, MD (Internal Medicine)  Extended Emergency Contact Information Primary Emergency Contact: [facility] Address: [address]          [place], [place] [id number] [place] Home Phone: [phone] Mobile Phone: [phone] Relation: Daughter Secondary Emergency Contact: Asa Saunasliver,Cynthia  [place] Home Phone: [phone] Relation: Grandaughter    Allergies: Penicillins and Penicillins  Chief Complaint  Patient presents with  . Acute Visit    Acute     HPI: Patient is 83 y.o. female who is being seen acutely for recurrent C Diff. Pt was just treated for 6 weekd with oral vancomycin. Her diarrhea has stopped but the nurses report that the stool began smelling like c diff even though they were firm. The order was for nursing to get a stool sample the next day after the treatment was stopped. However it wasn't gotten until 10 days later and the results were called to the on call provider who started pt on vancomycin again. Today I am investigating this.  Past Medical History:  Diagnosis Date  . AKI (acute kidney injury) (HCC)   . Closed left hip fracture (HCC)   . Coronary artery disease   . Dementia   . Depression with anxiety   . Fall   . HLD (hyperlipidemia)   . Hyperlipidemia   . Hypertension   . Hypokalemia   . Transient ischemic attack (TIA)     Past Surgical History:  Procedure Laterality Date  . ABDOMINAL HYSTERECTOMY    . ANTERIOR APPROACH HEMI HIP ARTHROPLASTY Left 05/25/2016   Procedure: ANTERIOR APPROACH HEMI HIP ARTHROPLASTY;  Surgeon: Samson FredericBrian Swinteck, MD;  Location: [facility];  Service: Orthopedics;  Laterality: Left;    Allergies as of 10/28/2016      Reactions   Penicillins Swelling   Facial and hand  swelling per daughter Has patient had a PCN reaction causing immediate rash, facial/tongue/throat swelling, SOB or lightheadedness with hypotension: Yes Has patient had a PCN reaction causing severe rash involving mucus membranes or skin necrosis: No Has patient had a PCN reaction that required hospitalization No Has patient had a PCN reaction occurring within the last 10 years: No If all of the above answers are "NO", then may proceed with Cephalosporin use.   Penicillins Swelling   Has patient had a PCN reaction causing immediate rash, facial/tongue/throat swelling, SOB or lightheadedness with hypotension: Yes Has patient had a PCN reaction causing severe rash involving mucus membranes or skin necrosis: No Has patient had a PCN reaction that required hospitalization No Has patient had a PCN reaction occurring within the last 10 years: No If all of the above answers are "NO", then may proceed with Cephalosporin use.      Medication List       Accurate as of 10/28/16 11:59 PM. Always use your most recent med list.          aspirin 81 MG EC tablet Take 1 tablet (81 mg total) by mouth 2 (two) times daily with a meal.   bisacodyl 5 MG EC tablet Commonly known as:  DULCOLAX Take 1 tablet (5 mg total) by mouth daily as needed for moderate constipation.   carvedilol 3.125 MG tablet Commonly known as:  COREG Take 1 tablet (3.125 mg total) by mouth  2 (two) times daily with a meal.   feeding supplement (PRO-STAT SUGAR FREE 64) Liqd Take 30 mLs by mouth 2 (two) times daily.   feeding supplement Liqd Take 1 Container by mouth 2 (two) times daily between meals.   ferrous sulfate 325 (65 FE) MG tablet Take 325 mg by mouth daily with breakfast.   HYDROcodone-acetaminophen 5-325 MG tablet Commonly known as:  NORCO/VICODIN Take 1 tablet by mouth every 8 (eight) hours as needed for moderate pain or severe pain.   memantine 10 MG tablet Commonly known as:  NAMENDA Take 10 mg by mouth 2  (two) times daily.   mirtazapine 15 MG tablet Commonly known as:  REMERON Take 15 mg by mouth at bedtime.   multivitamin tablet Take 1 tablet by mouth daily.   saccharomyces boulardii 250 MG capsule Commonly known as:  FLORASTOR Take 250 mg by mouth 2 (two) times daily.   UNABLE TO FIND Med Name: Magic Cup Give 1 cup by mouth 2 times daily with lunch and dinner due to weight loss       No orders of the defined types were placed in this encounter.   Immunization History  Administered Date(s) Administered  . Influenza,inj,Quad PF,36+ Mos 07/19/2016  . PPD Test 05/28/2016    Social History  Substance Use Topics  . Smoking status: Never Smoker  . Smokeless tobacco: Never Used  . Alcohol use No    Review of Systems  UTO 2/2 dementia - per nursing stool is normal     Vitals:   10/28/16 0903  BP: (!) 159/85  Pulse: 70  Resp: 18  Temp: 99.4 F (37.4 C)   Body mass index is 16.67 kg/m. Physical Exam  GENERAL APPEARANCE: Alert, min conversant, No acute distress  SKIN: No diaphoresis rash HEENT: Unremarkable RESPIRATORY: Breathing is even, unlabored. Lung sounds are clear   CARDIOVASCULAR: Heart RRR no murmurs, rubs or gallops. No peripheral edema  GASTROINTESTINAL: Abdomen is soft, non-tender, not distended w/ normal bowel sounds.  GENITOURINARY: Bladder non tender, not distended  MUSCULOSKELETAL: No abnormal joints or musculature NEUROLOGIC: Cranial nerves 2-12 grossly intact. Moves all extremities PSYCHIATRIC: Mood and affect with dementia, no behavioral issues  Patient Active Problem List   Diagnosis Date Noted  . FTT (failure to thrive) in adult 08/04/2016  . Depression 07/28/2016  . Leukocytosis 07/17/2016  . Pressure injury of skin 07/17/2016  . Malnutrition of moderate degree 07/17/2016  . C. difficile diarrhea 07/07/2016  . Pedal edema 06/27/2016  . Postoperative anemia due to acute blood loss 06/10/2016  . Newly recognized heart murmur  06/10/2016  . Acute renal failure superimposed on stage 3 chronic kidney disease (HCC) 06/10/2016  . Chronic combined systolic and diastolic CHF (congestive heart failure) (HCC) 05/28/2016  . Protein-calorie malnutrition, severe 05/25/2016  . Left displaced femoral neck fracture (HCC) 05/25/2016  . Fall 05/24/2016  . Hypokalemia 05/24/2016  . AKI (acute kidney injury) (HCC) 05/24/2016  . Hypertensive heart disease with chronic combined systolic and diastolic congestive heart failure (HCC)   . Dementia   . Depression with anxiety   . HLD (hyperlipidemia)   . Dysphagia due to recent stroke 05/31/2015  . Hyperlipidemia 05/30/2015  . Vitamin D deficiency 05/30/2015  . Acute CVA (cerebrovascular accident) (HCC)   . CVA (cerebral vascular accident) (HCC) 05/21/2015  . CKD (chronic kidney disease) stage 3, GFR 30-59 ml/min 05/21/2015  . Dementia 05/21/2015  . Hypertension 05/21/2015  . UTI (urinary tract infection) 05/25/2013  . Acute renal  failure (HCC) 05/25/2013  . Syncope 05/24/2013    CMP     Component Value Date/Time   NA 139 09/18/2016   K 4.7 09/18/2016   CL 115 (H) 07/19/2016 0319   CO2 21 (L) 07/19/2016 0319   GLUCOSE 87 07/19/2016 0319   BUN 25 (A) 09/18/2016   CREATININE 1.3 (A) 09/18/2016   CREATININE 1.21 (H) 07/19/2016 0319   CALCIUM 8.6 (L) 07/19/2016 0319   PROT 6.2 (L) 07/16/2016 2214   ALBUMIN 2.8 (L) 07/16/2016 2214   AST 15 09/18/2016   ALT 13 09/18/2016   ALKPHOS 79 09/18/2016   BILITOT 0.9 07/16/2016 2214   GFRNONAA 37 (L) 07/19/2016 0319   GFRAA 43 (L) 07/19/2016 0319    Recent Labs  07/17/16 0215 07/18/16 0420 07/18/16 0430 07/19/16 07/19/16 0319 09/18/16  NA 140 143  --  142 142 139  K 3.9 3.1*  --   --  4.2 4.7  CL 108 115*  --   --  115*  --   CO2 23 22  --   --  21*  --   GLUCOSE 136* 91  --   --  87  --   BUN 18 16  --  11 11 25*  CREATININE 1.41* 1.31*  --  1.2* 1.21* 1.3*  CALCIUM 9.1 8.5*  --   --  8.6*  --   MG  --   --  1.8  --    --   --     Recent Labs  06/28/16 07/16/16 2214 09/18/16  AST 19 19 15   ALT 14 10* 13  ALKPHOS 114 72 79  BILITOT  --  0.9  --   PROT  --  6.2*  --   ALBUMIN  --  2.8*  --     Recent Labs  05/23/16 2336  07/16/16 2214  07/17/16 0215 07/18/16 07/18/16 0420 09/18/16  WBC 10.8*  < > 22.1*  < > 20.2* 9.6 9.6 9.8  NEUTROABS 9.6*  --  19.7*  --   --   --   --   --   HGB 11.9*  < > 10.0*  < > 10.1*  --  8.1* 11.0*  HCT 35.8*  < > 31.2*  < > 31.7*  --  25.1* 36  MCV 81.9  < > 81.9  --  82.6  --  82.3  --   PLT 178  < > 347  < > 342  --  278 217  < > = values in this interval not displayed.  Recent Labs  06/28/16  CHOL 147  LDLCALC 73  TRIG 61   No results found for: [facility] Lab Results  Component Value Date   TSH 1.88 06/28/2016   Lab Results  Component Value Date   HGBA1C 5.4 06/28/2016   Lab Results  Component Value Date   CHOL 147 06/28/2016   HDL 62 06/28/2016   LDLCALC 73 06/28/2016   TRIG 61 06/28/2016   CHOLHDL 5.4 05/22/2015    Significant Diagnostic Results in last 30 days:  No results found.  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 I spoke with Dr Ilsa Iha ID about this. I.n the future the test that needs to be performed is c diff for antigen and toxin. Pt's vancomycin is d/c  And nursing made aware.     Time spent > 35  min;> 50% of time with patient was spent reviewing records, labs, tests and studies, counseling and developing plan of care  Ariel Goldsmith. [name], MD

## 2016-11-02 ENCOUNTER — Encounter: Payer: Self-pay | Admitting: Internal Medicine

## 2016-11-09 ENCOUNTER — Encounter: Payer: Self-pay | Admitting: Internal Medicine

## 2016-11-09 NOTE — Assessment & Plan Note (Signed)
Controlled for age;cont coreg 3.125 mg BID

## 2016-11-09 NOTE — Assessment & Plan Note (Signed)
Table ; cont ASA 81 mg BID as prophylaxis

## 2016-11-09 NOTE — Assessment & Plan Note (Signed)
LDL - 73;HDL - 62 on no meds;will monitor at intervals

## 2016-11-12 ENCOUNTER — Encounter: Payer: Self-pay | Admitting: Internal Medicine

## 2016-11-12 NOTE — Progress Notes (Signed)
Location:  [facility] Room Number: [phone] Place of Service:  SNF [phone])  Randon Goldsmith. [name], MD  Patient Care Team: [name], MD as PCP - General (Internal Medicine) [name], MD (Internal Medicine)  Extended Emergency Contact Information Primary Emergency Contact: [facility] Address: [address]          [place], [place] [address] [place] Home Phone: [phone] Mobile Phone: [phone] Relation: Daughter Secondary Emergency Contact: Asa Saunas [place] Home Phone: [phone] Relation: Grandaughter    Allergies: Penicillins and Penicillins  Chief Complaint  Patient presents with  . Acute Visit    HPI: Patient is 83 y.o. female who is being seen acutely because an outbreak of Influenza A per CDC guidelines was recognized on 10/02/2016. Pt has no c/o flu like symptoms;therefore pt will need to be prophylaxed with Tamiflu for a minimum of 14 days per CDC protocol.  Past Medical History:  Diagnosis Date  . AKI (acute kidney injury) (HCC)   . Closed left hip fracture (HCC)   . Coronary artery disease   . Dementia   . Depression with anxiety   . Fall   . HLD (hyperlipidemia)   . Hyperlipidemia   . Hypertension   . Hypokalemia   . Transient ischemic attack (TIA)     Past Surgical History:  Procedure Laterality Date  . ABDOMINAL HYSTERECTOMY    . ANTERIOR APPROACH HEMI HIP ARTHROPLASTY Left 05/25/2016   Procedure: ANTERIOR APPROACH HEMI HIP ARTHROPLASTY;  Surgeon: Samson Frederic, MD;  Location: [facility];  Service: Orthopedics;  Laterality: Left;    Allergies as of 10/02/2016      Reactions   Penicillins Swelling   Facial and hand swelling per daughter Has patient had a PCN reaction causing immediate rash, facial/tongue/throat swelling, SOB or lightheadedness with hypotension: Yes Has patient had a PCN reaction causing severe rash involving mucus membranes or skin necrosis:  No Has patient had a PCN reaction that required hospitalization No Has patient had a PCN reaction occurring within the last 10 years: No If all of the above answers are "NO", then may proceed with Cephalosporin use.   Penicillins Swelling   Has patient had a PCN reaction causing immediate rash, facial/tongue/throat swelling, SOB or lightheadedness with hypotension: Yes Has patient had a PCN reaction causing severe rash involving mucus membranes or skin necrosis: No Has patient had a PCN reaction that required hospitalization No Has patient had a PCN reaction occurring within the last 10 years: No If all of the above answers are "NO", then may proceed with Cephalosporin use.      Medication List       Accurate as of 10/02/16 11:59 PM. Always use your most recent med list.          aspirin 81 MG EC tablet Take 1 tablet (81 mg total) by mouth 2 (two) times daily with a meal.   bisacodyl 5 MG EC tablet Commonly known as:  DULCOLAX Take 1 tablet (5 mg total) by mouth daily as needed for moderate constipation.   carvedilol 3.125 MG tablet Commonly known as:  COREG Take 1 tablet (3.125 mg total) by mouth 2 (two) times daily with a meal.   feeding supplement (PRO-STAT SUGAR FREE 64) Liqd Take 30 mLs by mouth 2 (two) times daily.   feeding supplement Liqd Take 1 Container by mouth 2 (two) times daily between meals.   ferrous sulfate 325 (65 FE) MG tablet  Take 325 mg by mouth daily with breakfast.   HYDROcodone-acetaminophen 5-325 MG tablet Commonly known as:  NORCO/VICODIN Take 1 tablet by mouth every 8 (eight) hours as needed for moderate pain or severe pain.   memantine 10 MG tablet Commonly known as:  NAMENDA Take 10 mg by mouth 2 (two) times daily.   mirtazapine 15 MG tablet Commonly known as:  REMERON Take 15 mg by mouth at bedtime.   multivitamin tablet Take 1 tablet by mouth daily.   saccharomyces boulardii 250 MG capsule Commonly known as:  FLORASTOR Take 250 mg  by mouth 2 (two) times daily.   UNABLE TO FIND Med Name: Magic Cup Give 1 cup by mouth 2 times daily with lunch and dinner due to weight loss       No orders of the defined types were placed in this encounter.   Immunization History  Administered Date(s) Administered  . Influenza,inj,Quad PF,36+ Mos 07/19/2016  . PPD Test 05/28/2016    Social History  Substance Use Topics  . Smoking status: Never Smoker  . Smokeless tobacco: Never Used  . Alcohol use No    Review of Systems  DATA OBTAINED: from nurse GENERAL:  no fevers SKIN: No itching, rash HEENT: no rhinorrhea, congestion, ST or ear pain RESPIRATORY: No cough, wheezing, SOB CARDIAC: No chest pain, palpitations, lower extremity edema  GI: No abdominal pain, No N/V/D or constipation, No heartburn or reflux  MUSCULOSKELETAL: No muscle aches NEUROLOGIC: No headache, dizziness   Vitals:   10/02/16 1217  BP: (!) 159/85  Pulse: 77  Resp: 18  Temp: 99.4 F (37.4 C)   Body mass index is 16.67 kg/m. Physical Exam  GENERAL APPEARANCE: Alert, No acute distress  SKIN: No diaphoresis rash HEENT: Unremarkable RESPIRATORY: Breathing is even, unlabored. Lung sounds are clear   CARDIOVASCULAR: Heart RRR no murmurs, rubs or gallops. No peripheral edema  GASTROINTESTINAL: Abdomen is soft, non-tender, not distended w/ normal bowel sounds.   NEUROLOGIC: Cranial nerves 2-12 grossly intact PSYCHIATRIC: baseline, no mental status changes  Patient Active Problem List   Diagnosis Date Noted  . FTT (failure to thrive) in adult 08/04/2016  . Depression 07/28/2016  . Leukocytosis 07/17/2016  . Pressure injury of skin 07/17/2016  . Malnutrition of moderate degree 07/17/2016  . C. difficile diarrhea 07/07/2016  . Pedal edema 06/27/2016  . Postoperative anemia due to acute blood loss 06/10/2016  . Newly recognized heart murmur 06/10/2016  . Acute renal failure superimposed on stage 3 chronic kidney disease (HCC) 06/10/2016  .  Chronic combined systolic and diastolic CHF (congestive heart failure) (HCC) 05/28/2016  . Protein-calorie malnutrition, severe 05/25/2016  . Left displaced femoral neck fracture (HCC) 05/25/2016  . Fall 05/24/2016  . Hypokalemia 05/24/2016  . AKI (acute kidney injury) (HCC) 05/24/2016  . Hypertensive heart disease with chronic combined systolic and diastolic congestive heart failure (HCC)   . Dementia   . Depression with anxiety   . HLD (hyperlipidemia)   . Dysphagia due to recent stroke 05/31/2015  . Hyperlipidemia 05/30/2015  . Vitamin D deficiency 05/30/2015  . Acute CVA (cerebrovascular accident) (HCC)   . Hx of arterial ischemic stroke 05/21/2015  . CKD (chronic kidney disease) stage 3, GFR 30-59 ml/min 05/21/2015  . Dementia 05/21/2015  . Hypertension 05/21/2015  . UTI (urinary tract infection) 05/25/2013  . Acute renal failure (HCC) 05/25/2013  . Syncope 05/24/2013    CMP     Component Value Date/Time   NA 139 09/18/2016   K  4.7 09/18/2016   CL 115 (H) 07/19/2016 0319   CO2 21 (L) 07/19/2016 0319   GLUCOSE 87 07/19/2016 0319   BUN 25 (A) 09/18/2016   CREATININE 1.3 (A) 09/18/2016   CREATININE 1.21 (H) 07/19/2016 0319   CALCIUM 8.6 (L) 07/19/2016 0319   PROT 6.2 (L) 07/16/2016 2214   ALBUMIN 2.8 (L) 07/16/2016 2214   AST 15 09/18/2016   ALT 13 09/18/2016   ALKPHOS 79 09/18/2016   BILITOT 0.9 07/16/2016 2214   GFRNONAA 37 (L) 07/19/2016 0319   GFRAA 43 (L) 07/19/2016 0319    Recent Labs  07/17/16 0215 07/18/16 0420 07/18/16 0430 07/19/16 07/19/16 0319 09/18/16  NA 140 143  --  142 142 139  K 3.9 3.1*  --   --  4.2 4.7  CL 108 115*  --   --  115*  --   CO2 23 22  --   --  21*  --   GLUCOSE 136* 91  --   --  87  --   BUN 18 16  --  11 11 25*  CREATININE 1.41* 1.31*  --  1.2* 1.21* 1.3*  CALCIUM 9.1 8.5*  --   --  8.6*  --   MG  --   --  1.8  --   --   --     Recent Labs  06/28/16 07/16/16 2214 09/18/16  AST 19 19 15   ALT 14 10* 13  ALKPHOS 114 72  79  BILITOT  --  0.9  --   PROT  --  6.2*  --   ALBUMIN  --  2.8*  --     Recent Labs  05/23/16 2336  07/16/16 2214  07/17/16 0215 07/18/16 07/18/16 0420 09/18/16  WBC 10.8*  < > 22.1*  < > 20.2* 9.6 9.6 9.8  NEUTROABS 9.6*  --  19.7*  --   --   --   --   --   HGB 11.9*  < > 10.0*  < > 10.1*  --  8.1* 11.0*  HCT 35.8*  < > 31.2*  < > 31.7*  --  25.1* 36  MCV 81.9  < > 81.9  --  82.6  --  82.3  --   PLT 178  < > 347  < > 342  --  278 217  < > = values in this interval not displayed.  Recent Labs  06/28/16  CHOL 147  LDLCALC 73  TRIG 61   No results found for: [facility] Lab Results  Component Value Date   TSH 1.88 06/28/2016   Lab Results  Component Value Date   HGBA1C 5.4 06/28/2016   Lab Results  Component Value Date   CHOL 147 06/28/2016   HDL 62 06/28/2016   LDLCALC 73 06/28/2016   TRIG 61 06/28/2016   CHOLHDL 5.4 05/22/2015    Significant Diagnostic Results in last 30 days:  No results found.  Assessment and Plan  EXPOSURE TO FLU/ INFLUENZA OUTBREAK AT SNF-   CrCl calculated by me-  22      Dose for 14 days-  30 mg q 48  Pt will be monitored daily for flu like symptoms  Noah Delaine. Sheppard Coil, MD

## 2016-11-15 ENCOUNTER — Non-Acute Institutional Stay (SKILLED_NURSING_FACILITY): Payer: Medicare Other | Admitting: Internal Medicine

## 2016-11-15 DIAGNOSIS — Z221 Carrier of other intestinal infectious diseases: Secondary | ICD-10-CM

## 2016-11-15 DIAGNOSIS — A0472 Enterocolitis due to Clostridium difficile, not specified as recurrent: Secondary | ICD-10-CM

## 2016-11-20 ENCOUNTER — Encounter: Payer: Self-pay | Admitting: Internal Medicine

## 2016-11-20 NOTE — Progress Notes (Signed)
Location:  [facility] Room Number: [phone] Place of Service:  SNF [phone])  Randon Goldsmith. [name], MD  Patient Care Team: [name], MD as PCP - General (Internal Medicine) [name], MD (Internal Medicine)  Extended Emergency Contact Information Primary Emergency Contact: [facility] Address: [address]          [place], [place] [address] [place] Home Phone: [phone] Mobile Phone: [phone] Relation: Daughter Secondary Emergency Contact: Asa [place] Home Phone: [phone] Relation: Grandaughter    Allergies: Penicillins and Penicillins  Chief Complaint  Patient presents with  . Acute Visit    Acute    HPI: Patient is 83 y.o. female who is being seen today to address the diarrhea she has been having for past 2-3 days. Nursing reports loose stool 3 times a day with C Diff odor. I know pt is colonized be cause I had her tested after she finished her last treatment and she was still + without sx. Pt is having no fever, still drinking OK.   Past Medical History:  Diagnosis Date  . AKI (acute kidney injury) (HCC)   . Closed left hip fracture (HCC)   . Coronary artery disease   . Dementia   . Depression with anxiety   . Fall   . HLD (hyperlipidemia)   . Hyperlipidemia   . Hypertension   . Hypokalemia   . Transient ischemic attack (TIA)     Past Surgical History:  Procedure Laterality Date  . ABDOMINAL HYSTERECTOMY    . ANTERIOR APPROACH HEMI HIP ARTHROPLASTY Left 05/25/2016   Procedure: ANTERIOR APPROACH HEMI HIP ARTHROPLASTY;  Surgeon: Samson Frederic, MD;  Location: [facility];  Service: Orthopedics;  Laterality: Left;    Allergies as of 11/15/2016      Reactions   Penicillins Swelling   Facial and hand swelling per daughter Has patient had a PCN reaction causing immediate rash, facial/tongue/throat swelling, SOB or lightheadedness with hypotension: Yes Has patient had a  PCN reaction causing severe rash involving mucus membranes or skin necrosis: No Has patient had a PCN reaction that required hospitalization No Has patient had a PCN reaction occurring within the last 10 years: No If all of the above answers are "NO", then may proceed with Cephalosporin use.   Penicillins Swelling   Has patient had a PCN reaction causing immediate rash, facial/tongue/throat swelling, SOB or lightheadedness with hypotension: Yes Has patient had a PCN reaction causing severe rash involving mucus membranes or skin necrosis: No Has patient had a PCN reaction that required hospitalization No Has patient had a PCN reaction occurring within the last 10 years: No If all of the above answers are "NO", then may proceed with Cephalosporin use.      Medication List       Accurate as of 11/15/16 11:59 PM. Always use your most recent med list.          aspirin 81 MG EC tablet Take 1 tablet (81 mg total) by mouth 2 (two) times daily with a meal.   bisacodyl 5 MG EC tablet Commonly known as:  DULCOLAX Take 1 tablet (5 mg total) by mouth daily as needed for moderate constipation.   carvedilol 3.125 MG tablet Commonly known as:  COREG Take 1 tablet (3.125 mg total) by mouth 2 (two) times daily with a meal.   feeding supplement (PRO-STAT SUGAR FREE 64) Liqd Take 30 mLs by mouth 2 (two) times daily.  feeding supplement Liqd Take 1 Container by mouth 2 (two) times daily between meals.   ferrous sulfate 325 (65 FE) MG tablet Take 325 mg by mouth daily with breakfast.   HYDROcodone-acetaminophen 5-325 MG tablet Commonly known as:  NORCO/VICODIN Take 1 tablet by mouth every 8 (eight) hours as needed for moderate pain or severe pain.   memantine 10 MG tablet Commonly known as:  NAMENDA Take 10 mg by mouth 2 (two) times daily.   mirtazapine 15 MG tablet Commonly known as:  REMERON Take 15 mg by mouth at bedtime.   multivitamin tablet Take 1 tablet by mouth daily.     saccharomyces boulardii 250 MG capsule Commonly known as:  FLORASTOR Take 250 mg by mouth 2 (two) times daily.   UNABLE TO FIND Med Name: Magic Cup Give 1 cup by mouth 2 times daily with lunch and dinner due to weight loss       No orders of the defined types were placed in this encounter.   Immunization History  Administered Date(s) Administered  . Influenza,inj,Quad PF,36+ Mos 07/19/2016  . PPD Test 05/28/2016    Social History  Substance Use Topics  . Smoking status: Never Smoker  . Smokeless tobacco: Never Used  . Alcohol use No    Review of Systems   UTO 2/2 dementia; nursing without concerns other than HPI    Vitals:   11/15/16 1134  BP: (!) 159/85  Pulse: 66  Resp: 18  Temp: 99.4 F (37.4 C)   Body mass index is 16.67 kg/m. Physical Exam  GENERAL APPEARANCE: Alert, min conversant, No acute distress  SKIN: No diaphoresis rash HEENT: Unremarkable RESPIRATORY: Breathing is even, unlabored. Lung sounds are clear   CARDIOVASCULAR: Heart RRR no murmurs, rubs or gallops. No peripheral edema  GASTROINTESTINAL: Abdomen is soft, non-tender, not distended w/ normal bowel sounds.  GENITOURINARY: Bladder non tender, not distended  MUSCULOSKELETAL: No abnormal joints or musculature NEUROLOGIC: Cranial nerves 2-12 grossly intact. Moves all extremities PSYCHIATRIC: Mood and affect with dementia, no behavioral issues  Patient Active Problem List   Diagnosis Date Noted  . FTT (failure to thrive) in adult 08/04/2016  . Depression 07/28/2016  . Leukocytosis 07/17/2016  . Pressure injury of skin 07/17/2016  . Malnutrition of moderate degree 07/17/2016  . C. difficile diarrhea 07/07/2016  . Pedal edema 06/27/2016  . Postoperative anemia due to acute blood loss 06/10/2016  . Newly recognized heart murmur 06/10/2016  . Acute renal failure superimposed on stage 3 chronic kidney disease (HCC) 06/10/2016  . Chronic combined systolic and diastolic CHF (congestive heart  failure) (HCC) 05/28/2016  . Protein-calorie malnutrition, severe 05/25/2016  . Left displaced femoral neck fracture (HCC) 05/25/2016  . Fall 05/24/2016  . Hypokalemia 05/24/2016  . AKI (acute kidney injury) (HCC) 05/24/2016  . Hypertensive heart disease with chronic combined systolic and diastolic congestive heart failure (HCC)   . Dementia   . Depression with anxiety   . HLD (hyperlipidemia)   . Dysphagia due to recent stroke 05/31/2015  . Hyperlipidemia 05/30/2015  . Vitamin D deficiency 05/30/2015  . Acute CVA (cerebrovascular accident) (HCC)   . Hx of arterial ischemic stroke 05/21/2015  . CKD (chronic kidney disease) stage 3, GFR 30-59 ml/min 05/21/2015  . Dementia 05/21/2015  . Hypertension 05/21/2015  . UTI (urinary tract infection) 05/25/2013  . Acute renal failure (HCC) 05/25/2013  . Syncope 05/24/2013    CMP     Component Value Date/Time   NA 139 09/18/2016   K  4.7 09/18/2016   CL 115 (H) 07/19/2016 0319   CO2 21 (L) 07/19/2016 0319   GLUCOSE 87 07/19/2016 0319   BUN 25 (A) 09/18/2016   CREATININE 1.3 (A) 09/18/2016   CREATININE 1.21 (H) 07/19/2016 0319   CALCIUM 8.6 (L) 07/19/2016 0319   PROT 6.2 (L) 07/16/2016 2214   ALBUMIN 2.8 (L) 07/16/2016 2214   AST 15 09/18/2016   ALT 13 09/18/2016   ALKPHOS 79 09/18/2016   BILITOT 0.9 07/16/2016 2214   GFRNONAA 37 (L) 07/19/2016 0319   GFRAA 43 (L) 07/19/2016 0319    Recent Labs  07/17/16 0215 07/18/16 0420 07/18/16 0430 07/19/16 07/19/16 0319 09/18/16  NA 140 143  --  142 142 139  K 3.9 3.1*  --   --  4.2 4.7  CL 108 115*  --   --  115*  --   CO2 23 22  --   --  21*  --   GLUCOSE 136* 91  --   --  87  --   BUN 18 16  --  11 11 25*  CREATININE 1.41* 1.31*  --  1.2* 1.21* 1.3*  CALCIUM 9.1 8.5*  --   --  8.6*  --   MG  --   --  1.8  --   --   --     Recent Labs  06/28/16 07/16/16 2214 09/18/16  AST 19 19 15   ALT 14 10* 13  ALKPHOS 114 72 79  BILITOT  --  0.9  --   PROT  --  6.2*  --   ALBUMIN   --  2.8*  --     Recent Labs  05/23/16 2336  07/16/16 2214  07/17/16 0215 07/18/16 07/18/16 0420 09/18/16  WBC 10.8*  < > 22.1*  < > 20.2* 9.6 9.6 9.8  NEUTROABS 9.6*  --  19.7*  --   --   --   --   --   HGB 11.9*  < > 10.0*  < > 10.1*  --  8.1* 11.0*  HCT 35.8*  < > 31.2*  < > 31.7*  --  25.1* 36  MCV 81.9  < > 81.9  --  82.6  --  82.3  --   PLT 178  < > 347  < > 342  --  278 217  < > = values in this interval not displayed.  Recent Labs  06/28/16  CHOL 147  LDLCALC 73  TRIG 61   [facility] Results  Component Value Date   TSH 1.88 06/28/2016   Lab Results  Component Value Date   HGBA1C 5.4 06/28/2016   Lab Results  Component Value Date   CHOL 147 06/28/2016   HDL 62 06/28/2016   LDLCALC 73 06/28/2016   TRIG 61 06/28/2016   CHOLHDL 5.4 05/22/2015    Significant Diagnostic Results in last 30 days:  No results found.  Assessment and Plan  C dIFF DIARRHEA AND C DIFF COLONIZATION - I spoke with DR Luciana Axeomer on phone call for ID and discussed case with him. After a lengthy discussion it was decided to start pt on vancomycin 125 mg q 6 to start until C Diff toxin comes back. She will always be + for antigen, but will only have toxin with an active infection.    Time spent > 35 min;> 50% of time with patient was spent reviewing records, labs, tests and studies, counseling and developing plan of care  Noah Delaine. Sheppard Coil, MD

## 2016-11-26 ENCOUNTER — Non-Acute Institutional Stay (SKILLED_NURSING_FACILITY): Payer: Medicare Other | Admitting: Internal Medicine

## 2016-11-26 ENCOUNTER — Encounter: Payer: Self-pay | Admitting: Internal Medicine

## 2016-11-26 DIAGNOSIS — F32A Depression, unspecified: Secondary | ICD-10-CM

## 2016-11-26 DIAGNOSIS — G301 Alzheimer's disease with late onset: Secondary | ICD-10-CM | POA: Diagnosis not present

## 2016-11-26 DIAGNOSIS — R627 Adult failure to thrive: Secondary | ICD-10-CM | POA: Diagnosis not present

## 2016-11-26 DIAGNOSIS — F0280 Dementia in other diseases classified elsewhere without behavioral disturbance: Secondary | ICD-10-CM

## 2016-11-26 DIAGNOSIS — F329 Major depressive disorder, single episode, unspecified: Secondary | ICD-10-CM | POA: Diagnosis not present

## 2016-11-26 NOTE — Progress Notes (Signed)
Location:  [facility] Room Number: [phone] Place of Service:  SNF [phone])  Merrilee Seashore, MD  Patient Care Team: Margit Hanks, MD as PCP - General (Internal Medicine) [name], MD (Internal Medicine)  Extended Emergency Contact Information Primary Emergency Contact: [facility] Address: [address]          [place], [place] [address] [place] Home Phone: [phone] Mobile Phone: [phone] Relation: Daughter Secondary Emergency Contact: Asa Saunas [place] Home Phone: [phone] Relation: Grandaughter    Allergies: Penicillins and Penicillins  Chief Complaint  Patient presents with  . Medical Management of Chronic Issues    Routine Visit    HPI: Patient is 83 y.o. female who is being seen for routine issues of dementia, depression, and FTT.  Past Medical History:  Diagnosis Date  . AKI (acute kidney injury) (HCC)   . Closed left hip fracture (HCC)   . Coronary artery disease   . Dementia   . Depression with anxiety   . Fall   . HLD (hyperlipidemia)   . Hyperlipidemia   . Hypertension   . Hypokalemia   . Transient ischemic attack (TIA)     Past Surgical History:  Procedure Laterality Date  . ABDOMINAL HYSTERECTOMY    . ANTERIOR APPROACH HEMI HIP ARTHROPLASTY Left 05/25/2016   Procedure: ANTERIOR APPROACH HEMI HIP ARTHROPLASTY;  Surgeon: Samson Frederic, MD;  Location: [facility];  Service: Orthopedics;  Laterality: Left;    Allergies as of 11/26/2016      Reactions   Penicillins Swelling   Facial and hand swelling per daughter Has patient had a PCN reaction causing immediate rash, facial/tongue/throat swelling, SOB or lightheadedness with hypotension: Yes Has patient had a PCN reaction causing severe rash involving mucus membranes or skin necrosis: No Has patient had a PCN reaction that required hospitalization No Has patient had a PCN reaction occurring within the last  10 years: No If all of the above answers are "NO", then may proceed with Cephalosporin use.   Penicillins Swelling   Has patient had a PCN reaction causing immediate rash, facial/tongue/throat swelling, SOB or lightheadedness with hypotension: Yes Has patient had a PCN reaction causing severe rash involving mucus membranes or skin necrosis: No Has patient had a PCN reaction that required hospitalization No Has patient had a PCN reaction occurring within the last 10 years: No If all of the above answers are "NO", then may proceed with Cephalosporin use.      Medication List       Accurate as of 11/26/16 11:59 PM. Always use your most recent med list.          aspirin 81 MG EC tablet Take 1 tablet (81 mg total) by mouth 2 (two) times daily with a meal.   bisacodyl 5 MG EC tablet Commonly known as:  DULCOLAX Take 1 tablet (5 mg total) by mouth daily as needed for moderate constipation.   carvedilol 3.125 MG tablet Commonly known as:  COREG Take 1 tablet (3.125 mg total) by mouth 2 (two) times daily with a meal.   feeding supplement (PRO-STAT SUGAR FREE 64) Liqd Take 30 mLs by mouth 2 (two) times daily.   feeding supplement Liqd Take 1 Container by mouth 2 (two) times daily between meals.   ferrous sulfate 325 (65 FE) MG tablet Take 325 mg by mouth daily with breakfast.   HYDROcodone-acetaminophen 5-325 MG tablet Commonly known as:  NORCO/VICODIN Take 1 tablet  by mouth every 8 (eight) hours as needed for moderate pain or severe pain.   memantine 10 MG tablet Commonly known as:  NAMENDA Take 10 mg by mouth 2 (two) times daily.   mirtazapine 15 MG tablet Commonly known as:  REMERON Take 15 mg by mouth at bedtime.   multivitamin tablet Take 1 tablet by mouth daily.   saccharomyces boulardii 250 MG capsule Commonly known as:  FLORASTOR Take 250 mg by mouth 2 (two) times daily.   UNABLE TO FIND Med Name: Magic Cup Give 1 cup by mouth 2 times daily with lunch and dinner  due to weight loss       No orders of the defined types were placed in this encounter.   Immunization History  Administered Date(s) Administered  . Influenza,inj,Quad PF,36+ Mos 07/19/2016  . PPD Test 05/28/2016    Social History  Substance Use Topics  . Smoking status: Never Smoker  . Smokeless tobacco: Never Used  . Alcohol use No    Review of Systems  UTO 2/2 dementia; nursing with no new concerns    Vitals:   11/26/16 1153  BP: (!) 141/88  Pulse: 94  Resp: 18  Temp: 97.6 F (36.4 C)   Body mass index is 16.67 kg/m. Physical Exam  GENERAL APPEARANCE: Alert, min conversant, No acute distress  SKIN: No diaphoresis rash HEENT: Unremarkable RESPIRATORY: Breathing is even, unlabored. Lung sounds are clear   CARDIOVASCULAR: Heart RRR no murmurs, rubs or gallops. No peripheral edema  GASTROINTESTINAL: Abdomen is soft, non-tender, not distended w/ normal bowel sounds.  GENITOURINARY: Bladder non tender, not distended  MUSCULOSKELETAL: No abnormal joints or musculature; frail NEUROLOGIC: Cranial nerves 2-12 grossly intact. Moves all extremities PSYCHIATRIC: Mood and affect with dementia, no behavioral issues  Patient Active Problem List   Diagnosis Date Noted  . 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 FTT (failure to thrive) in adult 08/04/2016  . Depression 07/28/2016  . Leukocytosis 07/17/2016  . Pressure injury of skin 07/17/2016  . Malnutrition of moderate degree 07/17/2016  . C. difficile diarrhea 07/07/2016  . Pedal edema 06/27/2016  . Postoperative anemia due to acute blood loss 06/10/2016  . Newly recognized heart murmur 06/10/2016  . Acute renal failure superimposed on stage 3 chronic kidney disease (HCC) 06/10/2016  . Chronic combined systolic and diastolic CHF (congestive heart failure) (HCC) 05/28/2016  . Protein-calorie malnutrition, severe 05/25/2016  . Left displaced femoral neck fracture (HCC) 05/25/2016  . Fall 05/24/2016  . Hypokalemia  05/24/2016  . AKI (acute kidney injury) (HCC) 05/24/2016  . Hypertensive heart disease with chronic combined systolic and diastolic congestive heart failure (HCC)   . Dementia   . Depression with anxiety   . HLD (hyperlipidemia)   . Dysphagia due to recent stroke 05/31/2015  . Hyperlipidemia 05/30/2015  . Vitamin D deficiency 05/30/2015  . Acute CVA (cerebrovascular accident) (HCC)   . Hx of arterial ischemic stroke 05/21/2015  . CKD (chronic kidney disease) stage 3, GFR 30-59 ml/min 05/21/2015  . Dementia 05/21/2015  . Hypertension 05/21/2015  . UTI (urinary tract infection) 05/25/2013  . Acute renal failure (HCC) 05/25/2013  . Syncope 05/24/2013    CMP     Component Value Date/Time   NA 139 09/18/2016   K 4.7 09/18/2016   CL 115 (H) 07/19/2016 0319   CO2 21 (L) 07/19/2016 0319   GLUCOSE 87 07/19/2016 0319   BUN 25 (A) 09/18/2016   CREATININE 1.3 (A) 09/18/2016   CREATININE 1.21 (H) 07/19/2016 0319  CALCIUM 8.6 (L) 07/19/2016 0319   PROT 6.2 (L) 07/16/2016 2214   ALBUMIN 2.8 (L) 07/16/2016 2214   AST 15 09/18/2016   ALT 13 09/18/2016   ALKPHOS 79 09/18/2016   BILITOT 0.9 07/16/2016 2214   GFRNONAA 37 (L) 07/19/2016 0319   GFRAA 43 (L) 07/19/2016 0319    Recent Labs  07/17/16 0215 07/18/16 0420 07/18/16 0430 07/19/16 07/19/16 0319 09/18/16  NA 140 143  --  142 142 139  K 3.9 3.1*  --   --  4.2 4.7  CL 108 115*  --   --  115*  --   CO2 23 22  --   --  21*  --   GLUCOSE 136* 91  --   --  87  --   BUN 18 16  --  11 11 25*  CREATININE 1.41* 1.31*  --  1.2* 1.21* 1.3*  CALCIUM 9.1 8.5*  --   --  8.6*  --   MG  --   --  1.8  --   --   --     Recent Labs  06/28/16 07/16/16 2214 09/18/16  AST 19 19 15   ALT 14 10* 13  ALKPHOS 114 72 79  BILITOT  --  0.9  --   PROT  --  6.2*  --   ALBUMIN  --  2.8*  --     Recent Labs  05/23/16 2336  07/16/16 2214  07/17/16 0215 07/18/16 07/18/16 0420 09/18/16  WBC 10.8*  < > 22.1*  < > 20.2* 9.6 9.6 9.8  NEUTROABS  9.6*  --  19.7*  --   --   --   --   --   HGB 11.9*  < > 10.0*  < > 10.1*  --  8.1* 11.0*  HCT 35.8*  < > 31.2*  < > 31.7*  --  25.1* 36  MCV 81.9  < > 81.9  --  82.6  --  82.3  --   PLT 178  < > 347  < > 342  --  278 217  < > = values in this interval not displayed.  Recent Labs  06/28/16  CHOL 147  LDLCALC 73  TRIG 61   [facility]: [facility] Lab Results  Component Value Date   TSH 1.88 06/28/2016   Lab Results  Component Value Date   HGBA1C 5.4 06/28/2016   Lab Results  Component Value Date   CHOL 147 06/28/2016   HDL 62 06/28/2016   LDLCALC 73 06/28/2016   TRIG 61 06/28/2016   CHOLHDL 5.4 05/22/2015    Significant Diagnostic Results in last 30 days:  No results found.  Assessment and Plan  Dementia Chronic and stable; pt is now eating a little better; plan to cont namenda 10 mg BID  Depression Stable; cont remeron 15 mg qHS  FTT (failure to thrive) in adult Py has has an active [facility] of C [facility] but [facility] is now eating a little better; will cont tom monitor and support with hopes of continued improvement    Thurston Holenne D. Lyn HollingsheadAlexander, MD

## 2016-12-14 ENCOUNTER — Encounter: Payer: Self-pay | Admitting: Internal Medicine

## 2016-12-14 DIAGNOSIS — Z221 Carrier of other intestinal infectious diseases: Secondary | ICD-10-CM | POA: Insufficient documentation

## 2016-12-26 ENCOUNTER — Encounter: Payer: Self-pay | Admitting: Internal Medicine

## 2016-12-26 ENCOUNTER — Non-Acute Institutional Stay (SKILLED_NURSING_FACILITY): Payer: Medicare Other | Admitting: Internal Medicine

## 2016-12-26 DIAGNOSIS — I110 Hypertensive heart disease with heart failure: Secondary | ICD-10-CM

## 2016-12-26 DIAGNOSIS — N183 Chronic kidney disease, stage 3 unspecified: Secondary | ICD-10-CM

## 2016-12-26 DIAGNOSIS — I5042 Chronic combined systolic (congestive) and diastolic (congestive) heart failure: Secondary | ICD-10-CM

## 2016-12-26 NOTE — Progress Notes (Signed)
Location:  [facility] Room Number: [phone] Place of Service:  SNF [phone])  Margit Hanks, MD  Patient Care Team: Margit Hanks, MD as PCP - General (Internal Medicine) [name], MD (Internal Medicine)  Extended Emergency Contact Information Primary Emergency Contact: [facility] Address: [address]          [place], [place] [address] [place] Home Phone: [phone] Mobile Phone: [phone] Relation: Daughter Secondary Emergency Contact: Asa [place] Home Phone: [phone] Relation: Grandaughter    Allergies: Penicillins and Penicillins  Chief Complaint  Patient presents with  . Medical Management of Chronic Issues    Routine Visit    HPI: Patient is 83 y.o. female who is being seen for routine issues of CKD3, HTN and CHF.  Past Medical History:  Diagnosis Date  . AKI (acute kidney injury) (HCC)   . Closed left hip fracture (HCC)   . Coronary artery disease   . Dementia   . Dementia   . Depression with anxiety   . Fall   . HLD (hyperlipidemia)   . Hyperlipidemia   . Hypertension   . Hypokalemia   . Transient ischemic attack (TIA)     Past Surgical History:  Procedure Laterality Date  . ABDOMINAL HYSTERECTOMY    . ANTERIOR APPROACH HEMI HIP ARTHROPLASTY Left 05/25/2016   Procedure: ANTERIOR APPROACH HEMI HIP ARTHROPLASTY;  Surgeon: Samson Frederic, MD;  Location: [facility];  Service: Orthopedics;  Laterality: Left;    Allergies as of 12/26/2016      Reactions   Penicillins Swelling   Facial and hand swelling per daughter Has patient had a PCN reaction causing immediate rash, facial/tongue/throat swelling, SOB or lightheadedness with hypotension: Yes Has patient had a PCN reaction causing severe rash involving mucus membranes or skin necrosis: No Has patient had a PCN reaction that required hospitalization No Has patient had a PCN reaction occurring within  the last 10 years: No If all of the above answers are "NO", then may proceed with Cephalosporin use.   Penicillins Swelling   Has patient had a PCN reaction causing immediate rash, facial/tongue/throat swelling, SOB or lightheadedness with hypotension: Yes Has patient had a PCN reaction causing severe rash involving mucus membranes or skin necrosis: No Has patient had a PCN reaction that required hospitalization No Has patient had a PCN reaction occurring within the last 10 years: No If all of the above answers are "NO", then may proceed with Cephalosporin use.      Medication List       Accurate as of 12/26/16 11:59 PM. Always use your most recent med list.          aspirin 81 MG EC tablet Take 1 tablet (81 mg total) by mouth 2 (two) times daily with a meal.   bisacodyl 5 MG EC tablet Commonly known as:  DULCOLAX Take 1 tablet (5 mg total) by mouth daily as needed for moderate constipation.   carvedilol 3.125 MG tablet Commonly known as:  COREG Take 1 tablet (3.125 mg total) by mouth 2 (two) times daily with a meal.   feeding supplement (PRO-STAT SUGAR FREE 64) Liqd Take 30 mLs by mouth 2 (two) times daily.   feeding supplement Liqd Take 1 Container by mouth 2 (two) times daily between meals.   ferrous sulfate 325 (65 FE) MG tablet Take 325 mg by mouth daily with breakfast.   HYDROcodone-acetaminophen 5-325 MG tablet Commonly known as:  NORCO/VICODIN Take 1 tablet by mouth every 8 (eight) hours as needed for moderate pain or severe pain.   memantine 10 MG tablet Commonly known as:  NAMENDA Take 10 mg by mouth 2 (two) times daily.   mirtazapine 15 MG tablet Commonly known as:  REMERON Take 15 mg by mouth at bedtime.   multivitamin tablet Take 1 tablet by mouth daily.   saccharomyces boulardii 250 MG capsule Commonly known as:  FLORASTOR Take 250 mg by mouth 2 (two) times daily.   UNABLE TO FIND Med Name: Magic Cup Give 1 cup by mouth 2 times daily with lunch  and dinner due to weight loss       No orders of the defined types were placed in this encounter.   Immunization History  Administered Date(s) Administered  . Influenza,inj,Quad PF,36+ Mos 07/19/2016  . PPD Test 05/28/2016    Social History  Substance Use Topics  . Smoking status: Never Smoker  . Smokeless tobacco: Never Used  . Alcohol use No    Review of Systems  UTO 2/2 dementia    Vitals:   12/26/16 1017  BP: 127/73  Pulse: 85  Resp: 18  Temp: 97.1 F (36.2 C)   Body mass index is 16.99 kg/m. Physical Exam  GENERAL APPEARANCE: Alert,  No acute distress  SKIN: No diaphoresis rash HEENT: Unremarkable RESPIRATORY: Breathing is even, unlabored. Lung sounds are clear   CARDIOVASCULAR: Heart RRR no murmurs, rubs or gallops. No peripheral edema  GASTROINTESTINAL: Abdomen is soft, non-tender, not distended w/ normal bowel sounds.  GENITOURINARY: Bladder non tender, not distended  MUSCULOSKELETAL: No abnormal joints or musculature NEUROLOGIC: Cranial nerves 2-12 grossly intact. Moves all extremities PSYCHIATRIC: dementia, no behavioral issues  Patient Active Problem List   Diagnosis Date Noted  . 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 FTT (failure to thrive) in adult 08/04/2016  . Depression 07/28/2016  . Leukocytosis 07/17/2016  . Pressure injury of skin 07/17/2016  . Malnutrition of moderate degree 07/17/2016  . C. difficile diarrhea 07/07/2016  . Pedal edema 06/27/2016  . Postoperative anemia due to acute blood loss 06/10/2016  . Newly recognized heart murmur 06/10/2016  . Acute renal failure superimposed on stage 3 chronic kidney disease (HCC) 06/10/2016  . Chronic combined systolic and diastolic CHF (congestive heart failure) (HCC) 05/28/2016  . Protein-calorie malnutrition, severe 05/25/2016  . Left displaced femoral neck fracture (HCC) 05/25/2016  . Fall 05/24/2016  . Hypokalemia 05/24/2016  . AKI (acute kidney injury) (HCC) 05/24/2016  .  Hypertensive heart disease with chronic combined systolic and diastolic congestive heart failure (HCC)   . Dementia   . Depression with anxiety   . HLD (hyperlipidemia)   . Dysphagia due to recent stroke 05/31/2015  . Hyperlipidemia 05/30/2015  . Vitamin D deficiency 05/30/2015  . Acute CVA (cerebrovascular accident) (HCC)   . Hx of arterial ischemic stroke 05/21/2015  . CKD (chronic kidney disease) stage 3, GFR 30-59 ml/min 05/21/2015  . Dementia 05/21/2015  . Hypertension 05/21/2015  . UTI (urinary tract infection) 05/25/2013  . Acute renal failure (HCC) 05/25/2013  . Syncope 05/24/2013    CMP     Component Value Date/Time   NA 138 01/03/2017 1526   NA 138 01/03/2017   K 5.4 (H) 01/03/2017 1526   CL 105 01/03/2017 1526   CO2 23 01/03/2017 1526   GLUCOSE 103 (H) 01/03/2017 1526   BUN 40 (H) 01/03/2017 1526   BUN 40 (A) 01/03/2017   CREATININE 1.55 (H) 01/03/2017 1526  CALCIUM 9.2 01/03/2017 1526   PROT 6.9 01/03/2017 1526   ALBUMIN 3.2 (L) 01/03/2017 1526   AST 36 01/03/2017 1526   ALT 9 (L) 01/03/2017 1526   ALKPHOS 72 01/03/2017 1526   BILITOT 1.1 01/03/2017 1526   GFRNONAA 27 (L) 01/03/2017 1526   GFRAA 31 (L) 01/03/2017 1526    Recent Labs  07/18/16 0420 07/18/16 0430  07/19/16 0319 09/18/16 12/30/16 01/03/17 01/03/17 1526  NA 143  --   < > 142 139 146 138 138  K 3.1*  --   --  4.2 4.7 5.1  --  5.4*  CL 115*  --   --  115*  --   --   --  105  CO2 22  --   --  21*  --   --   --  23  GLUCOSE 91  --   --  87  --   --   --  103*  BUN 16  --   < > 11 25* 31* 40* 40*  CREATININE 1.31*  --   < > 1.21* 1.3* 1.5* 1.6* 1.55*  CALCIUM 8.5*  --   --  8.6*  --   --   --  9.2  MG  --  1.8  --   --   --   --   --   --   < > = values in this interval not displayed.  Recent Labs  07/16/16 2214 09/18/16 12/30/16 01/03/17 1526  AST 36  ALT 10* 13 9 9*  ALKPHOS 72 79 71 72  BILITOT 0.9  --   --  1.1  PROT 6.2*  --   --  6.9  ALBUMIN 2.8*  --   --  3.2*     Recent Labs  05/23/16 2336  07/16/16 2214  07/17/16 0215  07/18/16 0420 09/18/16 12/30/16 01/03/17 01/03/17 1526  WBC 10.8*  < > 22.1*  < > 20.2*  < > 9.6 9.8 5.8 7.6 7.6  NEUTROABS 9.6*  --  19.7*  --   --   --   --   --   --   --   --   HGB 11.9*  < > 10.0*  < > 10.1*  --  8.1* 11.0* 10.8*  --  10.5*  HCT 35.8*  < > 31.2*  < > 31.7*  --  25.1* 36 34*  --  32.8*  MCV 81.9  < > 81.9  --  82.6  --  82.3  --   --   --  84.8  PLT 178  < > 347  < > 342  --  278 217 251  --  227  < > = values in this interval not displayed.  Recent Labs  06/28/16 12/30/16  CHOL 147 178  LDLCALC 73 107  TRIG 61 86   No results found for: [facility] Lab Results  Component Value Date   TSH 3.40 12/30/2016   Lab Results  Component Value Date   HGBA1C 5.8 12/30/2016   Lab Results  Component Value Date   CHOL 178 12/30/2016   HDL 54 12/30/2016   LDLCALC 107 12/30/2016   TRIG 86 12/30/2016   CHOLHDL 5.4 05/22/2015    Significant Diagnostic Results in last 30 days:  Ct Head Wo Contrast  Result Date: 01/03/2017 CLINICAL DATA:  Acute mental status change.  Syncope. EXAM: CT HEAD WITHOUT CONTRAST TECHNIQUE: Contiguous axial images were obtained from the base of  the skull through the vertex without intravenous contrast. COMPARISON:  July 17, 2016 FINDINGS: Brain: No subdural, epidural, or subarachnoid hemorrhage. Stable meningiomas described on the previous study in the right frontal and left temporal regions. A right occipital infarct is stable. A left anterior temporal lobe infarct is stable. Scattered white matter changes are unchanged. Cerebellum, brainstem, and basal cisterns are stable. Ventricles and sulci are prominent consistent with atrophy. No acute cortical ischemia identified on today's study. No mass effect or midline shift. Vascular: Calcified atherosclerosis in the intracranial carotid arteries. Skull: Normal. Negative for fracture or focal lesion. Sinuses/Orbits: Opacification of  scattered ethmoid air cells. Nodularity in the nasal cavities bilaterally, likely representing polyps, unchanged in the interval. Opacification of scattered left mastoid air cells is not acute. The right mastoid air cells and bilateral middle ears are well aerated. Other: None. IMPRESSION: 1. No acute intracranial abnormality.  Chronic changes as above. Electronically Signed   By: Gerome Sam III M.D   On: 01/03/2017 17:46    Assessment and Plan  CKD (chronic kidney disease) stage 3, GFR 30-59 ml/min Last GFR 31, Cr 1.55 which is stable; doing well; will monitor at intervals  Hypertensive heart disease with chronic combined systolic and diastolic congestive heart failure (HCC) Controlled; cont coreg 3.125 mg BID  Chronic combined systolic and diastolic CHF (congestive heart failure) (HCC) No exacerbations; controlled on no diuretics; cont coreg 3.125 mg BID    Randon Goldsmith. [name], MD

## 2016-12-28 ENCOUNTER — Encounter: Payer: Self-pay | Admitting: Internal Medicine

## 2016-12-28 NOTE — Assessment & Plan Note (Signed)
Chronic and stable; pt is now eating a little better; plan to cont namenda 10 mg BID

## 2016-12-28 NOTE — Assessment & Plan Note (Signed)
Py has has an active infection of C diff for most of her time at [facility] but she is now eating a little better; will cont tom monitor and support with hopes of continued improvement

## 2016-12-28 NOTE — Assessment & Plan Note (Signed)
Stable; cont remeron 15 mg qHS 

## 2016-12-30 LAB — LIPID PANEL
Cholesterol: 178 mg/dL (ref 0–200)
HDL: 54 mg/dL (ref 35–70)
LDL CALC: 107 mg/dL
TRIGLYCERIDES: 86 mg/dL (ref 40–160)

## 2016-12-30 LAB — CBC AND DIFFERENTIAL
HCT: 34 % — AB (ref 36–46)
Hemoglobin: 10.8 g/dL — AB (ref 12.0–16.0)
PLATELETS: 251 10*3/uL (ref 150–399)
WBC: 5.8 10*3/mL

## 2016-12-30 LAB — TSH: TSH: 3.4 u[IU]/mL (ref 0.41–5.90)

## 2016-12-30 LAB — BASIC METABOLIC PANEL
BUN: 31 mg/dL — AB (ref 4–21)
Creatinine: 1.5 mg/dL — AB (ref 0.5–1.1)
GLUCOSE: 73 mg/dL
Potassium: 5.1 mmol/L (ref 3.4–5.3)
Sodium: 146 mmol/L (ref 137–147)

## 2016-12-30 LAB — HEMOGLOBIN A1C: HEMOGLOBIN A1C: 5.8

## 2016-12-30 LAB — HEPATIC FUNCTION PANEL
ALK PHOS: 71 U/L (ref 25–125)
ALT: 9 U/L (ref 7–35)
AST: 14 U/L (ref 13–35)

## 2016-12-30 LAB — VITAMIN D 25 HYDROXY (VIT D DEFICIENCY, FRACTURES): Vit D, 25-Hydroxy: 34.78

## 2017-01-03 ENCOUNTER — Non-Acute Institutional Stay (SKILLED_NURSING_FACILITY): Payer: Medicare Other | Admitting: Internal Medicine

## 2017-01-03 ENCOUNTER — Encounter: Payer: Self-pay | Admitting: Internal Medicine

## 2017-01-03 ENCOUNTER — Encounter (HOSPITAL_COMMUNITY): Payer: Self-pay | Admitting: Emergency Medicine

## 2017-01-03 ENCOUNTER — Emergency Department (HOSPITAL_COMMUNITY): Payer: Medicare Other

## 2017-01-03 ENCOUNTER — Emergency Department (HOSPITAL_COMMUNITY)
Admission: EM | Admit: 2017-01-03 | Discharge: 2017-01-04 | Disposition: A | Discharge status: Home / Self Care | Payer: Medicare Other | Attending: Emergency Medicine | Admitting: Emergency Medicine

## 2017-01-03 DIAGNOSIS — N183 Chronic kidney disease, stage 3 (moderate): Secondary | ICD-10-CM | POA: Diagnosis not present

## 2017-01-03 DIAGNOSIS — Z79899 Other long term (current) drug therapy: Secondary | ICD-10-CM | POA: Diagnosis not present

## 2017-01-03 DIAGNOSIS — S91301A Unspecified open wound, right foot, initial encounter: Secondary | ICD-10-CM

## 2017-01-03 DIAGNOSIS — I5042 Chronic combined systolic (congestive) and diastolic (congestive) heart failure: Secondary | ICD-10-CM | POA: Diagnosis not present

## 2017-01-03 DIAGNOSIS — N3090 Cystitis, unspecified without hematuria: Secondary | ICD-10-CM

## 2017-01-03 DIAGNOSIS — Y999 Unspecified external cause status: Secondary | ICD-10-CM | POA: Insufficient documentation

## 2017-01-03 DIAGNOSIS — R55 Syncope and collapse: Secondary | ICD-10-CM | POA: Diagnosis not present

## 2017-01-03 DIAGNOSIS — Y929 Unspecified place or not applicable: Secondary | ICD-10-CM | POA: Diagnosis not present

## 2017-01-03 DIAGNOSIS — Z8673 Personal history of transient ischemic attack (TIA), and cerebral infarction without residual deficits: Secondary | ICD-10-CM | POA: Diagnosis not present

## 2017-01-03 DIAGNOSIS — I2510 Atherosclerotic heart disease of native coronary artery without angina pectoris: Secondary | ICD-10-CM | POA: Diagnosis not present

## 2017-01-03 DIAGNOSIS — R230 Cyanosis: Secondary | ICD-10-CM

## 2017-01-03 DIAGNOSIS — I130 Hypertensive heart and chronic kidney disease with heart failure and stage 1 through stage 4 chronic kidney disease, or unspecified chronic kidney disease: Secondary | ICD-10-CM | POA: Diagnosis not present

## 2017-01-03 DIAGNOSIS — Y939 Activity, unspecified: Secondary | ICD-10-CM | POA: Insufficient documentation

## 2017-01-03 DIAGNOSIS — Z7982 Long term (current) use of aspirin: Secondary | ICD-10-CM | POA: Diagnosis not present

## 2017-01-03 DIAGNOSIS — R61 Generalized hyperhidrosis: Secondary | ICD-10-CM | POA: Diagnosis not present

## 2017-01-03 DIAGNOSIS — X58XXXA Exposure to other specified factors, initial encounter: Secondary | ICD-10-CM | POA: Diagnosis not present

## 2017-01-03 LAB — BASIC METABOLIC PANEL
ANION GAP: 10 (ref 5–15)
BUN: 40 mg/dL — AB (ref 4–21)
BUN: 40 mg/dL — AB (ref 6–20)
CHLORIDE: 105 mmol/L (ref 101–111)
CO2: 23 mmol/L (ref 22–32)
Calcium: 9.2 mg/dL (ref 8.9–10.3)
Creatinine, Ser: 1.55 mg/dL — ABNORMAL HIGH (ref 0.44–1.00)
Creatinine: 1.6 mg/dL — AB (ref 0.5–1.1)
GFR calc Af Amer: 31 mL/min — ABNORMAL LOW (ref >60)
GFR, EST NON AFRICAN AMERICAN: 27 mL/min — AB (ref >60)
GLUCOSE: 103 mg/dL — AB (ref 65–99)
Glucose: 103 mg/dL
POTASSIUM: 5.4 mmol/L — AB (ref 3.5–5.1)
Sodium: 138 mmol/L (ref 135–145)
Sodium: 138 mmol/L (ref 137–147)

## 2017-01-03 LAB — CBC
HEMATOCRIT: 32.8 % — AB (ref 36.0–46.0)
HEMOGLOBIN: 10.5 g/dL — AB (ref 12.0–15.0)
MCH: 27.1 pg (ref 26.0–34.0)
MCHC: 32 g/dL (ref 30.0–36.0)
MCV: 84.8 fL (ref 78.0–100.0)
Platelets: 227 10*3/uL (ref 150–400)
RBC: 3.87 MIL/uL (ref 3.87–5.11)
RDW: 17.2 % — AB (ref 11.5–15.5)
WBC: 7.6 10*3/uL (ref 4.0–10.5)

## 2017-01-03 LAB — URINALYSIS, ROUTINE W REFLEX MICROSCOPIC
BILIRUBIN URINE: NEGATIVE
Glucose, UA: NEGATIVE mg/dL
Ketones, ur: NEGATIVE mg/dL
NITRITE: NEGATIVE
PH: 6 (ref 5.0–8.0)
Protein, ur: NEGATIVE mg/dL
SPECIFIC GRAVITY, URINE: 1.01 (ref 1.005–1.030)

## 2017-01-03 LAB — CBG MONITORING, ED: GLUCOSE-CAPILLARY: 94 mg/dL (ref 65–99)

## 2017-01-03 LAB — I-STAT TROPONIN, ED: TROPONIN I, POC: 0.03 ng/mL (ref 0.00–0.08)

## 2017-01-03 LAB — HEPATIC FUNCTION PANEL
ALBUMIN: 3.2 g/dL — AB (ref 3.5–5.0)
ALT: 9 U/L — ABNORMAL LOW (ref 14–54)
AST: 36 U/L (ref 15–41)
Alkaline Phosphatase: 72 U/L (ref 38–126)
BILIRUBIN DIRECT: 0.5 mg/dL (ref 0.1–0.5)
BILIRUBIN TOTAL: 1.1 mg/dL (ref 0.3–1.2)
Bilirubin, Total: 1.1 mg/dL
Indirect Bilirubin: 0.6 mg/dL (ref 0.3–0.9)
Total Protein: 6.9 g/dL (ref 6.5–8.1)

## 2017-01-03 LAB — TROPONIN I

## 2017-01-03 LAB — CBC AND DIFFERENTIAL: WBC: 7.6 10*3/mL

## 2017-01-03 MED ORDER — SULFAMETHOXAZOLE-TRIMETHOPRIM 800-160 MG PO TABS: 1.0000 | ORAL_TABLET | Freq: Two times a day (BID) | ORAL | 0 refills | Status: AC

## 2017-01-03 NOTE — ED Provider Notes (Signed)
[facility]-EMERGENCY DEPT Provider Note   CSN: [id number] Arrival date & time: 01/03/17  1428     History   Chief Complaint Chief Complaint  Patient presents with  . Loss of Consciousness    HPI Ariel Douglas is a 83 y.o. female.  The history is provided by the patient and the nursing home. History limited by: dementia.  Loss of Consciousness   This is a new problem. The current episode started less than 1 hour ago. The problem occurs rarely. The problem has been resolved. She lost consciousness for a period of 1 to 5 minutes. The problem is associated with normal activity. Associated symptoms include chest pain (c/o chest pain and holding her chest once she came to), dizziness and light-headedness. Pertinent negatives include abdominal pain, bladder incontinence, bowel incontinence, confusion, diaphoresis, fever, nausea, vomiting and weakness. She has tried nothing for the symptoms. Her past medical history is significant for CAD and HTN.    Past Medical History:  Diagnosis Date  . AKI (acute kidney injury) (HCC)   . Closed left hip fracture (HCC)   . Coronary artery disease   . Dementia   . Dementia   . Depression with anxiety   . Fall   . HLD (hyperlipidemia)   . Hyperlipidemia   . Hypertension   . Hypokalemia   . Transient ischemic attack (TIA)     Patient Active Problem List   Diagnosis Date Noted  . 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 FTT (failure to thrive) in adult 08/04/2016  . Depression 07/28/2016  . Leukocytosis 07/17/2016  . Pressure injury of skin 07/17/2016  . Malnutrition of moderate degree 07/17/2016  . C. difficile diarrhea 07/07/2016  . Pedal edema 06/27/2016  . Postoperative anemia due to acute blood loss 06/10/2016  . Newly recognized heart murmur 06/10/2016  . Acute renal failure superimposed on stage 3 chronic kidney disease (HCC) 06/10/2016  . Chronic combined systolic and diastolic CHF (congestive heart failure) (HCC) 05/28/2016  .  Protein-calorie malnutrition, severe 05/25/2016  . Left displaced femoral neck fracture (HCC) 05/25/2016  . Fall 05/24/2016  . Hypokalemia 05/24/2016  . AKI (acute kidney injury) (HCC) 05/24/2016  . Hypertensive heart disease with chronic combined systolic and diastolic congestive heart failure (HCC)   . Dementia   . Depression with anxiety   . HLD (hyperlipidemia)   . Dysphagia due to recent stroke 05/31/2015  . Hyperlipidemia 05/30/2015  . Vitamin D deficiency 05/30/2015  . Acute CVA (cerebrovascular accident) (HCC)   . Hx of arterial ischemic stroke 05/21/2015  . CKD (chronic kidney disease) stage 3, GFR 30-59 ml/min 05/21/2015  . Dementia 05/21/2015  . Hypertension 05/21/2015  . UTI (urinary tract infection) 05/25/2013  . Acute renal failure (HCC) 05/25/2013  . Syncope 05/24/2013    Past Surgical History:  Procedure Laterality Date  . ABDOMINAL HYSTERECTOMY    . ANTERIOR APPROACH HEMI HIP ARTHROPLASTY Left 05/25/2016   Procedure: ANTERIOR APPROACH HEMI HIP ARTHROPLASTY;  Surgeon: Samson Frederic, MD;  Location: [facility];  Service: Orthopedics;  Laterality: Left;    OB History    No data available       Home Medications    Prior to Admission medications   Medication Sig Start Date End Date Taking? Authorizing Provider  Amino Acids-Protein Hydrolys (FEEDING SUPPLEMENT, PRO-STAT SUGAR FREE 64,) LIQD Take 30 mLs by mouth 2 (two) times daily.    [provider]  aspirin EC 81 MG EC tablet Take 1 tablet (81 mg total) by mouth 2 (  two) times daily with a meal. 05/28/16   Swinteck, Arlys John, MD  bisacodyl (DULCOLAX) 5 MG EC tablet Take 1 tablet (5 mg total) by mouth daily as needed for moderate constipation. 05/28/16   Calvert Cantor, MD  carvedilol (COREG) 3.125 MG tablet Take 1 tablet (3.125 mg total) by mouth 2 (two) times daily with a meal. 05/28/16   Calvert Cantor, MD  feeding supplement (BOOST / RESOURCE BREEZE) LIQD Take 1 Container by mouth 2 (two) times daily between  meals.    [provider]  ferrous sulfate 325 (65 FE) MG tablet Take 325 mg by mouth daily with breakfast.     [provider]  HYDROcodone-acetaminophen (NORCO/VICODIN) 5-325 MG tablet Take 1 tablet by mouth every 8 (eight) hours as needed for moderate pain or severe pain. 07/19/16   Rodolph Bong, MD  memantine (NAMENDA) 10 MG tablet Take 10 mg by mouth 2 (two) times daily.    [provider]  mirtazapine (REMERON) 15 MG tablet Take 15 mg by mouth at bedtime.    [provider]  Multiple Vitamin (MULTIVITAMIN) tablet Take 1 tablet by mouth daily.    [provider]  saccharomyces boulardii (FLORASTOR) 250 MG capsule Take 250 mg by mouth 2 (two) times daily.    [provider]  sulfamethoxazole-trimethoprim (BACTRIM DS,SEPTRA DS) 800-160 MG tablet Take 1 tablet by mouth 2 (two) times daily. 01/03/17 01/08/17  Marijean Niemann, MD  UNABLE TO FIND Med Name: Magic Cup Give 1 cup by mouth 2 times daily with lunch and dinner due to weight loss    [provider]    Family History Family History  Problem Relation Age of Onset  . Asthma Other   . Heart disease Other     Social History Social History  Substance Use Topics  . Smoking status: Never Smoker  . Smokeless tobacco: Never Used  . Alcohol use No     Allergies   Penicillins and Penicillins   Review of Systems Review of Systems  Constitutional: Negative for diaphoresis and fever.  HENT: Negative.   Respiratory: Negative for cough and shortness of breath.   Cardiovascular: Positive for chest pain (c/o chest pain and holding her chest once she came to) and syncope.  Gastrointestinal: Negative for abdominal pain, bowel incontinence, nausea and vomiting.  Genitourinary: Negative for bladder incontinence, decreased urine volume and dysuria.  Skin: Negative.   Neurological: Positive for dizziness and light-headedness. Negative for weakness.  Psychiatric/Behavioral:  Negative for confusion.  All other systems reviewed and are negative.    Physical Exam Updated Vital Signs BP (!) 152/83 (BP Location: Right Arm)   Pulse 98   Temp 98.2 F (36.8 C) (Oral)   Resp 16   Wt 45.4 kg   SpO2 98%   BMI 17.16 kg/m   Physical Exam  Constitutional: She appears well-developed and well-nourished. No distress.  HENT:  Head: Atraumatic.  Mouth/Throat: Oropharynx is clear and moist.  Eyes: EOM are normal. Pupils are equal, round, and reactive to light.  Neck: Normal range of motion. Neck supple.  No bruits  Cardiovascular: Normal rate, regular rhythm and normal heart sounds.   No murmur heard. Pulmonary/Chest: Effort normal and breath sounds normal. No respiratory distress. She exhibits no tenderness.  Abdominal: Soft. She exhibits no distension. There is no tenderness. There is no guarding.  Musculoskeletal: Normal range of motion. She exhibits no edema or tenderness.  Chronic appearing wound in right heel that has some purulent material but  no erythema or sig pain  Neurological: She is alert. No cranial nerve deficit. She exhibits normal muscle tone. Coordination normal.  Hx of dementia but at baseline mental status  Skin: Skin is warm and dry. She is not diaphoretic. No erythema.  Psychiatric: She has a normal mood and affect.  Nursing note and vitals reviewed.    ED Treatments / Results  Labs (all labs ordered are listed, but only abnormal results are displayed) Labs Reviewed  BASIC METABOLIC PANEL - Abnormal; Notable for the following:       Result Value   Potassium 5.4 (*)    Glucose, Bld 103 (*)    BUN 40 (*)    Creatinine, Ser 1.55 (*)    GFR calc non Af Amer 27 (*)    GFR calc Af Amer 31 (*)    All other components within normal limits  CBC - Abnormal; Notable for the following:    Hemoglobin 10.5 (*)    HCT 32.8 (*)    RDW 17.2 (*)    All other components within normal limits  URINALYSIS, ROUTINE W REFLEX MICROSCOPIC - Abnormal;  Notable for the following:    APPearance CLOUDY (*)    Hgb urine dipstick MODERATE (*)    Leukocytes, UA MODERATE (*)    Bacteria, UA MANY (*)    Squamous Epithelial / LPF 0-5 (*)    All other components within normal limits  HEPATIC FUNCTION PANEL - Abnormal; Notable for the following:    Albumin 3.2 (*)    ALT 9 (*)    All other components within normal limits  TROPONIN I  CBG MONITORING, ED  I-STAT TROPOININ, ED    EKG  EKG Interpretation  Date/Time:  Friday Jan 03 2017 14:30:57 EDT Ventricular Rate:  96 PR Interval:    QRS Duration: 94 QT Interval:  370 QTC Calculation: 468 R Axis:   -12 Text Interpretation:  Sinus rhythm Abnormal R-wave progression, early transition Confirmed by [facility] MD, JASON [phone]) on 01/03/2017 3:11:24 PM       Radiology Ct Head Wo Contrast  Result Date: 01/03/2017 CLINICAL DATA:  Acute mental status change.  Syncope. EXAM: CT HEAD WITHOUT CONTRAST TECHNIQUE: Contiguous axial images were obtained from the base of the skull through the vertex without intravenous contrast. COMPARISON:  July 17, 2016 FINDINGS: Brain: No subdural, epidural, or subarachnoid hemorrhage. Stable meningiomas described on the previous study in the right frontal and left temporal regions. A right occipital infarct is stable. A left anterior temporal lobe infarct is stable. Scattered white matter changes are unchanged. Cerebellum, brainstem, and basal cisterns are stable. Ventricles and sulci are prominent consistent with atrophy. No acute cortical ischemia identified on today's study. No mass effect or midline shift. Vascular: Calcified atherosclerosis in the intracranial carotid arteries. Skull: Normal. Negative for fracture or focal lesion. Sinuses/Orbits: Opacification of scattered ethmoid air cells. Nodularity in the nasal cavities bilaterally, likely representing polyps, unchanged in the interval. Opacification of scattered left mastoid air cells is not acute. The right  mastoid air cells and bilateral middle ears are well aerated. Other: None. IMPRESSION: 1. No acute intracranial abnormality.  Chronic changes as above. Electronically Signed   By: Gerome Sam III M.D   On: 01/03/2017 17:46    Procedures Procedures (including critical care time)  Medications Ordered in ED Medications - No data to display   Initial Impression / Assessment and Plan / ED Course  I have reviewed the triage vital signs and the nursing  notes.  Pertinent labs & imaging results that were available during my care of the patient were reviewed by me and considered in my medical decision making (see chart for details).     Patient is a 83 year old female who presents from facility for a syncopal episode. She was being rolled back after eating when she slumped in her wheelchair for less than 5 minutes with loss of consciousness and was slightly altered afterwards. No obvious shaking or loss of bowel or bladder. She was complaining of some vague chest pain afterwards. Currently asymptomatic and back to baseline. She does have baseline dementia. No changes in baseline health per facility and family. Vital signs unremarkable and exam unremarkable with no murmurs or carotid bruits. No significant neuro findings.  Labs obtained with possible signs of UTI but otherwise baseline chemistryand findings on cell count. Dr. troponin negative. EKG at baseline and out significant changes suggestive of ACS. Review of patient's chart reveals a cardiac echo done in November without significant findings. She also had a carotid duplex as well as DVT studies Without significant findings. Head CT today negative.  Discussed all these findings extensively with family who wish to return to nursing home. We'll prescribe a course of Bactrim for possible UTI as well as right heel skin wound that is being managed by facility.  This time I doubt cardiac cause or neurologic cause.  I have reviewed all findings.  Patient stable for discharge home.  I have reviewed all results with the patient. Advised to f/u with facililty wound care. Family and patient agrees to stated plan. All questions answered. Advised to call or return to have any questions, new symptoms, change in symptoms, or symptoms that they do not understand.  Final Clinical Impressions(s) / ED Diagnoses   Final diagnoses:  Syncope, unspecified syncope type  Open wound of right heel, initial encounter  Cystitis    New Prescriptions Discharge Medication List as of 01/03/2017  7:37 PM    START taking these medications   Details  sulfamethoxazole-trimethoprim (BACTRIM DS,SEPTRA DS) 800-160 MG tablet Take 1 tablet by mouth 2 (two) times daily., Starting Fri 01/03/2017, Until Wed 01/08/2017, Print         Marijean NiemannWoodrum, Mayli Covington, MD 01/04/17 1443    Blane OharaZavitz, Joshua, MD 01/04/17 [phone]

## 2017-01-03 NOTE — Progress Notes (Signed)
Location:  [facility] Room Number: [phone] Place of Service:  SNF [phone])  Merrilee Seashore, MD  Patient Care Team: Margit Hanks, MD as PCP - General (Internal Medicine) [name], MD (Internal Medicine)  Extended Emergency Contact Information Primary Emergency Contact: [facility] Address: [address]          [place], [place] [address] [place] Home Phone: [phone] Mobile Phone: [phone] Relation: Daughter Secondary Emergency Contact: Asa [place] Home Phone: [phone] Relation: Grandaughter    Allergies: Penicillins and Penicillins  Chief Complaint  Patient presents with  . Medical Management of Chronic Issues    Routine Visit    HPI: Patient is 83 y.o. female who was found in chair with LOC, cold,clammy and blue. She came to when she was laid on her bed. All VS were stable but pt c/o tightness in her chest, denied SOB or radiation or nausea. Her mentation is back to baseline.  Past Medical History:  Diagnosis Date  . AKI (acute kidney injury) (HCC)   . Closed left hip fracture (HCC)   . Coronary artery disease   . Dementia   . Depression with anxiety   . Fall   . HLD (hyperlipidemia)   . Hyperlipidemia   . Hypertension   . Hypokalemia   . Transient ischemic attack (TIA)     Past Surgical History:  Procedure Laterality Date  . ABDOMINAL HYSTERECTOMY    . ANTERIOR APPROACH HEMI HIP ARTHROPLASTY Left 05/25/2016   Procedure: ANTERIOR APPROACH HEMI HIP ARTHROPLASTY;  Surgeon: Samson Frederic, MD;  Location: [facility];  Service: Orthopedics;  Laterality: Left;    Allergies as of 01/03/2017      Reactions   Penicillins Swelling   Facial and hand swelling per daughter Has patient had a PCN reaction causing immediate rash, facial/tongue/throat swelling, SOB or lightheadedness with hypotension: Yes Has patient had a PCN reaction causing severe rash involving mucus  membranes or skin necrosis: No Has patient had a PCN reaction that required hospitalization No Has patient had a PCN reaction occurring within the last 10 years: No If all of the above answers are "NO", then may proceed with Cephalosporin use.   Penicillins Swelling   Has patient had a PCN reaction causing immediate rash, facial/tongue/throat swelling, SOB or lightheadedness with hypotension: Yes Has patient had a PCN reaction causing severe rash involving mucus membranes or skin necrosis: No Has patient had a PCN reaction that required hospitalization No Has patient had a PCN reaction occurring within the last 10 years: No If all of the above answers are "NO", then may proceed with Cephalosporin use.      Medication List       Accurate as of 01/03/17  1:38 PM. Always use your most recent med list.          aspirin 81 MG EC tablet Take 1 tablet (81 mg total) by mouth 2 (two) times daily with a meal.   bisacodyl 5 MG EC tablet Commonly known as:  DULCOLAX Take 1 tablet (5 mg total) by mouth daily as needed for moderate constipation.   carvedilol 3.125 MG tablet Commonly known as:  COREG Take 1 tablet (3.125 mg total) by mouth 2 (two) times daily with a meal.   feeding supplement (PRO-STAT SUGAR FREE 64) Liqd Take 30 mLs by mouth 2 (two) times daily.   feeding supplement Liqd Take 1 Container by mouth 2 (two) times daily between  meals.   ferrous sulfate 325 (65 FE) MG tablet Take 325 mg by mouth daily with breakfast.   HYDROcodone-acetaminophen 5-325 MG tablet Commonly known as:  NORCO/VICODIN Take 1 tablet by mouth every 8 (eight) hours as needed for moderate pain or severe pain.   memantine 10 MG tablet Commonly known as:  NAMENDA Take 10 mg by mouth 2 (two) times daily.   mirtazapine 15 MG tablet Commonly known as:  REMERON Take 15 mg by mouth at bedtime.   multivitamin tablet Take 1 tablet by mouth daily.   saccharomyces boulardii 250 MG capsule Commonly known  as:  FLORASTOR Take 250 mg by mouth 2 (two) times daily.   UNABLE TO FIND Med Name: Magic Cup Give 1 cup by mouth 2 times daily with lunch and dinner due to weight loss       No orders of the defined types were placed in this encounter.   Immunization History  Administered Date(s) Administered  . Influenza,inj,Quad PF,36+ Mos 07/19/2016  . PPD Test 05/28/2016    Social History  Substance Use Topics  . Smoking status: Never Smoker  . Smokeless tobacco: Never Used  . Alcohol use No    Review of Systems  DATA OBTAINED: from nurse, pt GENERAL:  no fevers, fatigue, appetite changes SKIN: No itching, rash HEENT: No complaint RESPIRATORY: No cough, wheezing, SOB CARDIAC: + chest pain,no palpitations, lower extremity edema  GI: No abdominal pain, No N/V/D or constipation, No heartburn or reflux  GU: No dysuria, frequency or urgency, or incontinence  MUSCULOSKELETAL: No unrelieved bone/joint pain NEUROLOGIC: No headache, dizziness  PSYCHIATRIC: No overt anxiety or sadness  Vitals:   01/03/17 1336  BP: (!) 119/22  Pulse: 69  Resp: 16  Temp: 97.3 F (36.3 C)   Body mass index is 16.99 kg/m. Physical Exam  GENERAL APPEARANCE: Alert,  No acute distress now, extremities are warming up SKIN: No diaphoresis rash HEENT: Unremarkable RESPIRATORY: Breathing is even, unlabored. Lung sounds are clear   CARDIOVASCULAR: Heart RRR with occ pause, no murmurs, rubs or gallops. No peripheral edema  GASTROINTESTINAL: Abdomen is soft, non-tender, not distended w/ normal bowel sounds.  GENITOURINARY: Bladder non tender, not distended  MUSCULOSKELETAL: No abnormal joints or musculature NEUROLOGIC: Cranial nerves 2-12 grossly intact. Moves all extremities PSYCHIATRIC: Mood and affect appropriate to situation with dementia, no behavioral issues  Patient Active Problem List   Diagnosis Date Noted  . 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 FTT (failure to thrive) in adult 08/04/2016   . Depression 07/28/2016  . Leukocytosis 07/17/2016  . Pressure injury of skin 07/17/2016  . Malnutrition of moderate degree 07/17/2016  . C. difficile diarrhea 07/07/2016  . Pedal edema 06/27/2016  . Postoperative anemia due to acute blood loss 06/10/2016  . Newly recognized heart murmur 06/10/2016  . Acute renal failure superimposed on stage 3 chronic kidney disease (HCC) 06/10/2016  . Chronic combined systolic and diastolic CHF (congestive heart failure) (HCC) 05/28/2016  . Protein-calorie malnutrition, severe 05/25/2016  . Left displaced femoral neck fracture (HCC) 05/25/2016  . Fall 05/24/2016  . Hypokalemia 05/24/2016  . AKI (acute kidney injury) (HCC) 05/24/2016  . Hypertensive heart disease with chronic combined systolic and diastolic congestive heart failure (HCC)   . Dementia   . Depression with anxiety   . HLD (hyperlipidemia)   . Dysphagia due to recent stroke 05/31/2015  . Hyperlipidemia 05/30/2015  . Vitamin D deficiency 05/30/2015  . Acute CVA (cerebrovascular accident) (HCC)   . Hx of arterial  ischemic stroke 05/21/2015  . CKD (chronic kidney disease) stage 3, GFR 30-59 ml/min 05/21/2015  . Dementia 05/21/2015  . Hypertension 05/21/2015  . UTI (urinary tract infection) 05/25/2013  . Acute renal failure (HCC) 05/25/2013  . Syncope 05/24/2013    CMP     Component Value Date/Time   NA 146 12/30/2016   K 5.1 12/30/2016   CL 115 (H) 07/19/2016 0319   CO2 21 (L) 07/19/2016 0319   GLUCOSE 87 07/19/2016 0319   BUN 31 (A) 12/30/2016   CREATININE 1.5 (A) 12/30/2016   CREATININE 1.21 (H) 07/19/2016 0319   CALCIUM 8.6 (L) 07/19/2016 0319   PROT 6.2 (L) 07/16/2016 2214   ALBUMIN 2.8 (L) 07/16/2016 2214   AST 14 12/30/2016   ALT 9 12/30/2016   ALKPHOS 71 12/30/2016   BILITOT 0.9 07/16/2016 2214   GFRNONAA 37 (L) 07/19/2016 0319   GFRAA 43 (L) 07/19/2016 0319    Recent Labs  07/17/16 0215 07/18/16 0420 07/18/16 0430  07/19/16 0319 09/18/16 12/30/16  NA  140 143  --   < > 142 139 146  K 3.9 3.1*  --   --  4.2 4.7 5.1  CL 108 115*  --   --  115*  --   --   CO2 23 22  --   --  21*  --   --   GLUCOSE 136* 91  --   --  87  --   --   BUN 18 16  --   < > 11 25* 31*  CREATININE 1.41* 1.31*  --   < > 1.21* 1.3* 1.5*  CALCIUM 9.1 8.5*  --   --  8.6*  --   --   MG  --   --  1.8  --   --   --   --   < > = values in this interval not displayed.  Recent Labs  07/16/16 2214 09/18/16 12/30/16  AST 19 15 14   ALT 10* 13 9  ALKPHOS 72 79 71  BILITOT 0.9  --   --   PROT 6.2*  --   --   ALBUMIN 2.8*  --   --     Recent Labs  05/23/16 2336  07/16/16 2214  07/17/16 0215  07/18/16 0420 09/18/16 12/30/16  WBC 10.8*  < > 22.1*  < > 20.2*  < > 9.6 9.8 5.8  NEUTROABS 9.6*  --  19.7*  --   --   --   --   --   --   HGB 11.9*  < > 10.0*  < > 10.1*  --  8.1* 11.0* 10.8*  HCT 35.8*  < > 31.2*  < > 31.7*  --  25.1* 36 34*  MCV 81.9  < > 81.9  --  82.6  --  82.3  --   --   PLT 178  < > 347  < > 342  --  278 217 251  < > = values in this interval not displayed.  Recent Labs  06/28/16 12/30/16  CHOL 147 178  LDLCALC 73 107  TRIG 61 86   No results found for: [facility] Lab Results  Component Value Date   TSH 3.40 12/30/2016   Lab Results  Component Value Date   HGBA1C 5.8 12/30/2016   Lab Results  Component Value Date   CHOL 178 12/30/2016   HDL 54 12/30/2016   LDLCALC 107 12/30/2016   TRIG 86 12/30/2016   CHOLHDL 5.4  05/22/2015    Significant Diagnostic Results in last 30 days:  No results found.  Assessment and Plan  SYNCOPAL EPISODE WITH DIAPHORESIS AND CYANOSIS - despite fact pt looks OK now she is c/o CP and her syncope had to be prolonged for her to be cyanotic; plan to send to ED    Time spent> 35 min Kathyann Spaugh D. Lyn HollingsheadAlexander, MD

## 2017-01-03 NOTE — ED Triage Notes (Signed)
Arrived via EMS from [facility].  Staff reported to EMS patient syncope event "briefly" stated to staff she had chest pain after event. EMS reported on their assessment patient denies shortness of breath and has no complaints. Alert to her normal. CBG 133 Aspirin 324 mg given by staff.

## 2017-01-03 NOTE — Discharge Instructions (Signed)
Please f/u with wound care provider at nursing home for continued care of wound.

## 2017-01-03 NOTE — ED Notes (Signed)
Patient transported to CT via stretcher.

## 2017-01-03 NOTE — ED Notes (Signed)
Provided pt with Malaw[place]key sandwich and tea per MD.

## 2017-01-04 NOTE — ED Notes (Signed)
Taken by [facility] at this time. 

## 2017-01-17 ENCOUNTER — Encounter: Payer: Self-pay | Admitting: Internal Medicine

## 2017-01-17 ENCOUNTER — Non-Acute Institutional Stay (SKILLED_NURSING_FACILITY): Payer: Medicare Other | Admitting: Internal Medicine

## 2017-01-17 DIAGNOSIS — I10 Essential (primary) hypertension: Secondary | ICD-10-CM

## 2017-01-17 DIAGNOSIS — F329 Major depressive disorder, single episode, unspecified: Secondary | ICD-10-CM

## 2017-01-17 DIAGNOSIS — F32A Depression, unspecified: Secondary | ICD-10-CM

## 2017-01-17 DIAGNOSIS — Z8673 Personal history of transient ischemic attack (TIA), and cerebral infarction without residual deficits: Secondary | ICD-10-CM | POA: Diagnosis not present

## 2017-01-17 NOTE — Progress Notes (Signed)
Location:  [facility] Room Number: [phone] Place of Service:  SNF [phone])  Ariel Hanks, MD  Patient Care Team: Ariel Hanks, MD as PCP - General (Internal Medicine) [name], MD (Internal Medicine)  Extended Emergency Contact Information Primary Emergency Contact: [facility] Address: [address]          [place], [place] [address] [place] Home Phone: [phone] Mobile Phone: [phone] Relation: Daughter Secondary Emergency Contact: Asa Saunas [place] Home Phone: [phone] Relation: Grandaughter    Allergies: Penicillins and Penicillins  Chief Complaint  Patient presents with  . Medical Management of Chronic Issues    Routine Visit    HPI: Patient is 83 y.o. female who is being seen for routine issues of h/o arteriAL ischemic stroke, HTN and depression.  Past Medical History:  Diagnosis Date  . AKI (acute kidney injury) (HCC)   . Closed left hip fracture (HCC)   . Coronary artery disease   . Dementia   . Dementia   . Depression with anxiety   . Fall   . HLD (hyperlipidemia)   . Hyperlipidemia   . Hypertension   . Hypokalemia   . Transient ischemic attack (TIA)     Past Surgical History:  Procedure Laterality Date  . ABDOMINAL HYSTERECTOMY    . ANTERIOR APPROACH HEMI HIP ARTHROPLASTY Left 05/25/2016   Procedure: ANTERIOR APPROACH HEMI HIP ARTHROPLASTY;  Surgeon: Samson Frederic, MD;  Location: [facility];  Service: Orthopedics;  Laterality: Left;    Allergies as of 01/17/2017      Reactions   Penicillins Swelling   Facial and hand swelling per daughter Has patient had a PCN reaction causing immediate rash, facial/tongue/throat swelling, SOB or lightheadedness with hypotension: Yes Has patient had a PCN reaction causing severe rash involving mucus membranes or skin necrosis: No Has patient had a PCN reaction that required hospitalization No Has patient had a  PCN reaction occurring within the last 10 years: No If all of the above answers are "NO", then may proceed with Cephalosporin use.   Penicillins Swelling   Has patient had a PCN reaction causing immediate rash, facial/tongue/throat swelling, SOB or lightheadedness with hypotension: Yes Has patient had a PCN reaction causing severe rash involving mucus membranes or skin necrosis: No Has patient had a PCN reaction that required hospitalization No Has patient had a PCN reaction occurring within the last 10 years: No If all of the above answers are "NO", then may proceed with Cephalosporin use.      Medication List       Accurate as of 01/17/17 11:59 PM. Always use your most recent med list.          aspirin 81 MG EC tablet Take 1 tablet (81 mg total) by mouth 2 (two) times daily with a meal.   bisacodyl 5 MG EC tablet Commonly known as:  DULCOLAX Take 1 tablet (5 mg total) by mouth daily as needed for moderate constipation.   carvedilol 3.125 MG tablet Commonly known as:  COREG Take 1 tablet (3.125 mg total) by mouth 2 (two) times daily with a meal.   feeding supplement (PRO-STAT SUGAR FREE 64) Liqd Take 30 mLs by mouth 2 (two) times daily.   feeding supplement Liqd Take 1 Container by mouth 2 (two) times daily between meals.   ferrous sulfate 325 (65 FE) MG tablet Take 325 mg by mouth daily with breakfast.   HYDROcodone-acetaminophen 5-325 MG tablet  Commonly known as:  NORCO/VICODIN Take 1 tablet by mouth every 8 (eight) hours as needed for moderate pain or severe pain.   memantine 10 MG tablet Commonly known as:  NAMENDA Take 10 mg by mouth 2 (two) times daily.   mirtazapine 15 MG tablet Commonly known as:  REMERON Take 15 mg by mouth at bedtime.   multivitamin tablet Take 1 tablet by mouth daily.   saccharomyces boulardii 250 MG capsule Commonly known as:  FLORASTOR Take 250 mg by mouth 2 (two) times daily.   UNABLE TO FIND Med Name: Magic Cup Give 1 cup by  mouth 2 times daily with lunch and dinner due to weight loss       No orders of the defined types were placed in this encounter.   Immunization History  Administered Date(s) Administered  . Influenza,inj,Quad PF,36+ Mos 07/19/2016  . PPD Test 05/28/2016    Social History  Substance Use Topics  . Smoking status: Never Smoker  . Smokeless tobacco: Never Used  . Alcohol use No    Review of Systems UTO 2/2 dementia    Vitals:   01/17/17 1426  BP: 127/73  Pulse: 77  Resp: 16  Temp: 97.1 F (36.2 C)   Body mass index is 17.75 kg/m. Physical Exam  GENERAL APPEARANCE: Alert, min conversant, No acute distress  SKIN: No diaphoresis rash HEENT: Unremarkable RESPIRATORY: Breathing is even, unlabored. Lung sounds are clear   CARDIOVASCULAR: Heart RRR no murmurs, rubs or gallops. No peripheral edema  GASTROINTESTINAL: Abdomen is soft, non-tender, not distended w/ normal bowel sounds.  GENITOURINARY: Bladder non tender, not distended  MUSCULOSKELETAL: No abnormal joints or musculature NEUROLOGIC: Cranial nerves 2-12 grossly intact. Moves all extremities PSYCHIATRIC: Mood and affect with dementia, no behavioral issues  Patient Active Problem List   Diagnosis Date Noted  . 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 FTT (failure to thrive) in adult 08/04/2016  . Depression 07/28/2016  . Leukocytosis 07/17/2016  . Pressure injury of skin 07/17/2016  . Malnutrition of moderate degree 07/17/2016  . C. difficile diarrhea 07/07/2016  . Pedal edema 06/27/2016  . Postoperative anemia due to acute blood loss 06/10/2016  . Newly recognized heart murmur 06/10/2016  . Acute renal failure superimposed on stage 3 chronic kidney disease (HCC) 06/10/2016  . Chronic combined systolic and diastolic CHF (congestive heart failure) (HCC) 05/28/2016  . Protein-calorie malnutrition, severe 05/25/2016  . Left displaced femoral neck fracture (HCC) 05/25/2016  . Fall 05/24/2016  . Hypokalemia  05/24/2016  . AKI (acute kidney injury) (HCC) 05/24/2016  . Hypertensive heart disease with chronic combined systolic and diastolic congestive heart failure (HCC)   . Dementia   . Depression with anxiety   . HLD (hyperlipidemia)   . Dysphagia due to recent stroke 05/31/2015  . Hyperlipidemia 05/30/2015  . Vitamin D deficiency 05/30/2015  . Acute CVA (cerebrovascular accident) (HCC)   . Hx of arterial ischemic stroke 05/21/2015  . CKD (chronic kidney disease) stage 3, GFR 30-59 ml/min 05/21/2015  . Dementia 05/21/2015  . Hypertension 05/21/2015  . UTI (urinary tract infection) 05/25/2013  . Acute renal failure (HCC) 05/25/2013  . Syncope 05/24/2013    CMP     Component Value Date/Time   NA 138 01/03/2017 1526   NA 138 01/03/2017   K 5.4 (H) 01/03/2017 1526   CL 105 01/03/2017 1526   CO2 23 01/03/2017 1526   GLUCOSE 103 (H) 01/03/2017 1526   BUN 40 (H) 01/03/2017 1526   BUN 40 (A)  01/03/2017   CREATININE 1.55 (H) 01/03/2017 1526   CALCIUM 9.2 01/03/2017 1526   PROT 6.9 01/03/2017 1526   ALBUMIN 3.2 (L) 01/03/2017 1526   AST 36 01/03/2017 1526   ALT 9 (L) 01/03/2017 1526   ALKPHOS 72 01/03/2017 1526   BILITOT 1.1 01/03/2017 1526   GFRNONAA 27 (L) 01/03/2017 1526   GFRAA 31 (L) 01/03/2017 1526    Recent Labs  07/18/16 0420 07/18/16 0430  07/19/16 0319 09/18/16 12/30/16 01/03/17 01/03/17 1526  NA 143  --   < > 142 139 146 138 138  K 3.1*  --   --  4.2 4.7 5.1  --  5.4*  CL 115*  --   --  115*  --   --   --  105  CO2 22  --   --  21*  --   --   --  23  GLUCOSE 91  --   --  87  --   --   --  103*  BUN 16  --   < > 11 25* 31* 40* 40*  CREATININE 1.31*  --   < > 1.21* 1.3* 1.5* 1.6* 1.55*  CALCIUM 8.5*  --   --  8.6*  --   --   --  9.2  MG  --  1.8  --   --   --   --   --   --   < > = values in this interval not displayed.  Recent Labs  07/16/16 2214 09/18/16 12/30/16 01/03/17 1526  AST 19 15 14  36  ALT 10* 13 9 9*  ALKPHOS 72 79 71 72  BILITOT 0.9  --   --   1.1  PROT 6.2*  --   --  6.9  ALBUMIN 2.8*  --   --  3.2*    Recent Labs  05/23/16 2336  07/16/16 2214  07/17/16 0215  07/18/16 0420 09/18/16 12/30/16 01/03/17 01/03/17 1526  WBC 10.8*  < > 22.1*  < > 20.2*  < > 9.6 9.8 5.8 7.6 7.6  NEUTROABS 9.6*  --  19.7*  --   --   --   --   --   --   --   --   HGB 11.9*  < > 10.0*  < > 10.1*  --  8.1* 11.0* 10.8*  --  10.5*  HCT 35.8*  < > 31.2*  < > 31.7*  --  25.1* 36 34*  --  32.8*  MCV 81.9  < > 81.9  --  82.6  --  82.3  --   --   --  84.8  PLT 178  < > 347  < > 342  --  278 217 251  --  227  < > = values in this interval not displayed.  Recent Labs  06/28/16 12/30/16  CHOL 147 178  LDLCALC 73 107  TRIG 61 86   [facility]: [facility] Lab Results  Component Value Date   TSH 3.40 12/30/2016   Lab Results  Component Value Date   HGBA1C 5.8 12/30/2016   Lab Results  Component Value Date   CHOL 178 12/30/2016   HDL 54 12/30/2016   LDLCALC 107 12/30/2016   TRIG 86 12/30/2016   CHOLHDL 5.4 05/22/2015    Significant Diagnostic Results in last 30 days:  No results found.  Assessment and Plan  Hx of arterial ischemic stroke Chronic and stable; cont ASA 81 mg BID as  prophylaxis  Hypertension Controlled ; cont coreg 3.125 mg BID  Depression Chronic and stable; cont remeron 15 mg qHS     Ariel Douglas D. [name], MD

## 2017-01-19 ENCOUNTER — Encounter: Payer: Self-pay | Admitting: Internal Medicine

## 2017-01-19 NOTE — Assessment & Plan Note (Signed)
Controlled; cont coreg 3.125 mg BID

## 2017-01-19 NOTE — Assessment & Plan Note (Signed)
No exacerbations; controlled on no diuretics; cont coreg 3.125 mg BID

## 2017-01-19 NOTE — Assessment & Plan Note (Signed)
Last GFR 31, Cr 1.55 which is stable; doing well; will monitor at intervals

## 2017-01-27 ENCOUNTER — Encounter: Payer: Self-pay | Admitting: Internal Medicine

## 2017-02-16 ENCOUNTER — Encounter: Payer: Self-pay | Admitting: Internal Medicine

## 2017-02-16 NOTE — Assessment & Plan Note (Signed)
Chronic and stable; cont remeron 15 mg qHS 

## 2017-02-16 NOTE — Assessment & Plan Note (Signed)
Controlled ; cont coreg 3.125 mg BID

## 2017-02-16 NOTE — Assessment & Plan Note (Signed)
Chronic and stable; cont ASA 81 mg BID as prophylaxis

## 2017-02-19 ENCOUNTER — Non-Acute Institutional Stay (SKILLED_NURSING_FACILITY): Payer: Medicare Other | Admitting: Internal Medicine

## 2017-02-19 ENCOUNTER — Encounter: Payer: Self-pay | Admitting: Internal Medicine

## 2017-02-19 DIAGNOSIS — F0280 Dementia in other diseases classified elsewhere without behavioral disturbance: Secondary | ICD-10-CM | POA: Diagnosis not present

## 2017-02-19 DIAGNOSIS — G301 Alzheimer's disease with late onset: Secondary | ICD-10-CM | POA: Diagnosis not present

## 2017-02-19 DIAGNOSIS — I110 Hypertensive heart disease with heart failure: Secondary | ICD-10-CM | POA: Diagnosis not present

## 2017-02-19 DIAGNOSIS — I5042 Chronic combined systolic (congestive) and diastolic (congestive) heart failure: Secondary | ICD-10-CM

## 2017-02-19 DIAGNOSIS — R627 Adult failure to thrive: Secondary | ICD-10-CM

## 2017-02-19 NOTE — Progress Notes (Signed)
Location:  [facility] Room Number: [phone] Place of Service:  SNF [phone])  Margit Hanks, MD  Patient Care Team: Margit Hanks, MD as PCP - General (Internal Medicine) [name], MD (Internal Medicine)  Extended Emergency Contact Information Primary Emergency Contact: [facility] Address: [address]          [place], [place] [address] [place] Home Phone: [phone] Mobile Phone: [phone] Relation: Daughter Secondary Emergency Contact: Asa Saun[place] Home Phone: [phone] Relation: Grandaughter    Allergies: Penicillins and Penicillins  Chief Complaint  Patient presents with  . Medical Management of Chronic Issues    Routine Visit    HPI: Patient is 83 y.o. female who Is being seen for routine issues of failure to thrive, dementia, and hypertension.  Past Medical History:  Diagnosis Date  . AKI (acute kidney injury) (HCC)   . Closed left hip fracture (HCC)   . Coronary artery disease   . Dementia   . Dementia   . Depression with anxiety   . Fall   . HLD (hyperlipidemia)   . Hyperlipidemia   . Hypertension   . Hypokalemia   . Transient ischemic attack (TIA)     Past Surgical History:  Procedure Laterality Date  . ABDOMINAL HYSTERECTOMY    . ANTERIOR APPROACH HEMI HIP ARTHROPLASTY Left 05/25/2016   Procedure: ANTERIOR APPROACH HEMI HIP ARTHROPLASTY;  Surgeon: Samson Frederic, MD;  Location: [facility];  Service: Orthopedics;  Laterality: Left;    Allergies as of 02/19/2017      Reactions   Penicillins Swelling   Facial and hand swelling per daughter Has patient had a PCN reaction causing immediate rash, facial/tongue/throat swelling, SOB or lightheadedness with hypotension: Yes Has patient had a PCN reaction causing severe rash involving mucus membranes or skin necrosis: No Has patient had a PCN reaction that required hospitalization No Has patient had a PCN  reaction occurring within the last 10 years: No If all of the above answers are "NO", then may proceed with Cephalosporin use.   Penicillins Swelling   Has patient had a PCN reaction causing immediate rash, facial/tongue/throat swelling, SOB or lightheadedness with hypotension: Yes Has patient had a PCN reaction causing severe rash involving mucus membranes or skin necrosis: No Has patient had a PCN reaction that required hospitalization No Has patient had a PCN reaction occurring within the last 10 years: No If all of the above answers are "NO", then may proceed with Cephalosporin use.      Medication List       Accurate as of 02/19/17 11:59 PM. Always use your most recent med list.          aspirin 81 MG EC tablet Take 1 tablet (81 mg total) by mouth 2 (two) times daily with a meal.   bisacodyl 5 MG EC tablet Commonly known as:  DULCOLAX Take 1 tablet (5 mg total) by mouth daily as needed for moderate constipation.   carvedilol 3.125 MG tablet Commonly known as:  COREG Take 1 tablet (3.125 mg total) by mouth 2 (two) times daily with a meal.   feeding supplement (PRO-STAT SUGAR FREE 64) Liqd Take 30 mLs by mouth 2 (two) times daily.   feeding supplement Liqd Take 1 Container by mouth 2 (two) times daily between meals.   ferrous sulfate 325 (65 FE) MG tablet Take 325 mg by mouth daily with breakfast.   HYDROcodone-acetaminophen 5-325 MG tablet Commonly  known as:  NORCO/VICODIN Take 1 tablet by mouth every 8 (eight) hours as needed for moderate pain or severe pain.   memantine 10 MG tablet Commonly known as:  NAMENDA Take 10 mg by mouth 2 (two) times daily.   mirtazapine 15 MG tablet Commonly known as:  REMERON Take 15 mg by mouth at bedtime.   multivitamin tablet Take 1 tablet by mouth daily.   saccharomyces boulardii 250 MG capsule Commonly known as:  FLORASTOR Take 250 mg by mouth 2 (two) times daily.   UNABLE TO FIND Med Name: Magic Cup Give 1 cup by mouth  2 times daily with lunch and dinner due to weight loss       No orders of the defined types were placed in this encounter.   Immunization History  Administered Date(s) Administered  . Influenza,inj,Quad PF,36+ Mos 07/19/2016  . PPD Test 05/28/2016    Social History  Substance Use Topics  . Smoking status: Never Smoker  . Smokeless tobacco: Never Used  . Alcohol use No    Review of Systems  DATA OBTAINED: from patient- Very limited participation; nurse-no acute concerns GENERAL:  no fevers, fatigue, appetite changes SKIN: No itching, rash HEENT: No complaint RESPIRATORY: No cough, wheezing, SOB CARDIAC: No chest pain, palpitations, lower extremity edema  GI: No abdominal pain, No N/V/D or constipation, No heartburn or reflux  GU: No dysuria, frequency or urgency, or incontinence  MUSCULOSKELETAL: No unrelieved bone/joint pain NEUROLOGIC: No headache, dizziness  PSYCHIATRIC: No overt anxiety or sadness  Vitals:   02/19/17 1207  BP: 117/75  Pulse: 88  Resp: 18  Temp: 97.9 F (36.6 C)   Body mass index is 18.16 kg/m. Physical Exam  GENERAL APPEARANCE: Alert, No acute distress ; Pleasant and pleasant black female SKIN: No diaphoresis rash HEENT: Unremarkable RESPIRATORY: Breathing is even, unlabored. Lung sounds are clear   CARDIOVASCULAR: Heart RRR no murmurs, rubs or gallops. No peripheral edema  GASTROINTESTINAL: Abdomen is soft, non-tender, not distended w/ normal bowel sounds.  GENITOURINARY: Bladder non tender, not distended  MUSCULOSKELETAL: No abnormal joints or musculature NEUROLOGIC: Cranial nerves 2-12 grossly intact. Moves all extremities PSYCHIATRIC: Mood and affect with dementia, no behavioral issues  Patient Active Problem List   Diagnosis Date Noted  . 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 FTT (failure to thrive) in adult 08/04/2016  . Depression 07/28/2016  . Leukocytosis 07/17/2016  . Pressure injury of skin 07/17/2016  .  Malnutrition of moderate degree 07/17/2016  . C. difficile diarrhea 07/07/2016  . Pedal edema 06/27/2016  . Postoperative anemia due to acute blood loss 06/10/2016  . Newly recognized heart murmur 06/10/2016  . Acute renal failure superimposed on stage 3 chronic kidney disease (HCC) 06/10/2016  . Chronic combined systolic and diastolic CHF (congestive heart failure) (HCC) 05/28/2016  . Protein-calorie malnutrition, severe 05/25/2016  . Left displaced femoral neck fracture (HCC) 05/25/2016  . Fall 05/24/2016  . Hypokalemia 05/24/2016  . AKI (acute kidney injury) (HCC) 05/24/2016  . Hypertensive heart disease with chronic combined systolic and diastolic congestive heart failure (HCC)   . Dementia   . Depression with anxiety   . HLD (hyperlipidemia)   . Dysphagia due to recent stroke 05/31/2015  . Hyperlipidemia 05/30/2015  . Vitamin D deficiency 05/30/2015  . Acute CVA (cerebrovascular accident) (HCC)   . Hx of arterial ischemic stroke 05/21/2015  . CKD (chronic kidney disease) stage 3, GFR 30-59 ml/min 05/21/2015  . Dementia 05/21/2015  . Hypertension 05/21/2015  . UTI (urinary  tract infection) 05/25/2013  . Acute renal failure (HCC) 05/25/2013  . Syncope 05/24/2013    CMP     Component Value Date/Time   NA 138 01/03/2017 1526   NA 138 01/03/2017   K 5.4 (H) 01/03/2017 1526   CL 105 01/03/2017 1526   CO2 23 01/03/2017 1526   GLUCOSE 103 (H) 01/03/2017 1526   BUN 40 (H) 01/03/2017 1526   BUN 40 (A) 01/03/2017   CREATININE 1.55 (H) 01/03/2017 1526   CALCIUM 9.2 01/03/2017 1526   PROT 6.9 01/03/2017 1526   ALBUMIN 3.2 (L) 01/03/2017 1526   AST 36 01/03/2017 1526   ALT 9 (L) 01/03/2017 1526   ALKPHOS 72 01/03/2017 1526   BILITOT 1.1 01/03/2017 1526   GFRNONAA 27 (L) 01/03/2017 1526   GFRAA 31 (L) 01/03/2017 1526    Recent Labs  07/18/16 0420 07/18/16 0430  07/19/16 0319 09/18/16 12/30/16 01/03/17 01/03/17 1526  NA 143  --   < > 142 139 146 138 138  K 3.1*  --    --  4.2 4.7 5.1  --  5.4*  CL 115*  --   --  115*  --   --   --  105  CO2 22  --   --  21*  --   --   --  23  GLUCOSE 91  --   --  87  --   --   --  103*  BUN 16  --   < > 11 25* 31* 40* 40*  CREATININE 1.31*  --   < > 1.21* 1.3* 1.5* 1.6* 1.55*  CALCIUM 8.5*  --   --  8.6*  --   --   --  9.2  MG  --  1.8  --   --   --   --   --   --   < > = values in this interval not displayed.  Recent Labs  07/16/16 2214 09/18/16 12/30/16 01/03/17 1526  AST 19 15 14  36  ALT 10* 13 9 9*  ALKPHOS 72 79 71 72  BILITOT 0.9  --   --  1.1  PROT 6.2*  --   --  6.9  ALBUMIN 2.8*  --   --  3.2*    Recent Labs  05/23/16 2336  07/16/16 2214  07/17/16 0215  07/18/16 0420 09/18/16 12/30/16 01/03/17 01/03/17 1526  WBC 10.8*  < > 22.1*  < > 20.2*  < > 9.6 9.8 5.8 7.6 7.6  NEUTROABS 9.6*  --  19.7*  --   --   --   --   --   --   --   --   HGB 11.9*  < > 10.0*  < > 10.1*  --  8.1* 11.0* 10.8*  --  10.5*  HCT 35.8*  < > 31.2*  < > 31.7*  --  25.1* 36 34*  --  32.8*  MCV 81.9  < > 81.9  --  82.6  --  82.3  --   --   --  84.8  PLT 178  < > 347  < > 342  --  278 217 251  --  227  < > = values in this interval not displayed.  Recent Labs  06/28/16 12/30/16  CHOL 147 178  LDLCALC 73 107  TRIG 61 86   No results found for: [facility] Lab Results  Component Value Date   TSH 3.40 12/30/2016   Lab Results  Component Value Date   HGBA1C 5.8 12/30/2016   Lab Results  Component Value Date   CHOL 178 12/30/2016   HDL 54 12/30/2016   LDLCALC 107 12/30/2016   TRIG 86 12/30/2016   CHOLHDL 5.4 05/22/2015    Significant Diagnostic Results in last 30 days:  No results found.  Assessment and Plan  FTT (failure to thrive) in adult Patient continued to improve; has had no recent infections; will continue to monitor.  Dementia Despite her illness patient's dementia has been stable without major; plan to continue Namenda 10 mg by mouth twice a day  Hypertensive heart disease with chronic combined  systolic and diastolic congestive heart failure (HCC) Stable; continue Coreg 3.125 mg by mouth twice a day    Jhayla Podgorski D. [name], MD

## 2017-02-20 ENCOUNTER — Non-Acute Institutional Stay (SKILLED_NURSING_FACILITY): Payer: Medicare Other | Admitting: Internal Medicine

## 2017-02-20 ENCOUNTER — Encounter: Payer: Self-pay | Admitting: Internal Medicine

## 2017-02-20 DIAGNOSIS — R55 Syncope and collapse: Secondary | ICD-10-CM

## 2017-02-20 NOTE — Progress Notes (Signed)
[facility] Room Number: [phone] Place of Service:  SNF [phone]  Margit HanksAlexander, Yoseline Andersson D, MD  Patient Care Team: Margit HanksAlexander, Rickeya Manus D, MD as PCP - General (Internal Medicine) Pearson GrippeKim, James, MD (Internal Medicine)  Extended Emergency Contact Information Primary Emergency Contact: [facility]Shirley Address: [address]          [place], [place] [address] [place] Home Phone: [phone] Mobile Phone: [phone] Relation: Daughter Secondary Emergency Contact: Asa Saunasliver,Cynthia  [place] Home Phone: [phone] Relation: Grandaughter    Allergies: Penicillins and Penicillins  Chief Complaint  Patient presents with  . Acute Visit    Acute    HPI: Patient is 83 y.o. female who Who nursing asked me to see because patient had an unresponsive episode in the dining room. As soon as she was brought to the room and laid down on her bed she became responsive again. Patient had no complaint of shortness of breath chest pain or other symptom. Her O2 sat was 98% no pulse was 96 her blood pressure was stable.  Past Medical History:  Diagnosis Date  . AKI (acute kidney injury) (HCC)   . Closed left hip fracture (HCC)   . Coronary artery disease   . Dementia   . Dementia   . Depression with anxiety   . Fall   . HLD (hyperlipidemia)   . Hyperlipidemia   . Hypertension   . Hypokalemia   . Transient ischemic attack (TIA)     Past Surgical History:  Procedure Laterality Date  . ABDOMINAL HYSTERECTOMY    . ANTERIOR APPROACH HEMI HIP ARTHROPLASTY Left 05/25/2016   Procedure: ANTERIOR APPROACH HEMI HIP ARTHROPLASTY;  Surgeon: Samson FredericBrian Swinteck, MD;  Location: [facility];  Service: Orthopedics;  Laterality: Left;    Allergies as of 02/20/2017      Reactions   Penicillins Swelling   Facial and hand swelling per daughter Has patient had a PCN reaction causing immediate rash, facial/tongue/throat swelling, SOB or  lightheadedness with hypotension: Yes Has patient had a PCN reaction causing severe rash involving mucus membranes or skin necrosis: No Has patient had a PCN reaction that required hospitalization No Has patient had a PCN reaction occurring within the last 10 years: No If all of the above answers are "NO", then may proceed with Cephalosporin use.   Penicillins Swelling   Has patient had a PCN reaction causing immediate rash, facial/tongue/throat swelling, SOB or lightheadedness with hypotension: Yes Has patient had a PCN reaction causing severe rash involving mucus membranes or skin necrosis: No Has patient had a PCN reaction that required hospitalization No Has patient had a PCN reaction occurring within the last 10 years: No If all of the above answers are "NO", then may proceed with Cephalosporin use.      Medication List       Accurate as of 02/20/17 12:11 PM. Always use your most recent med list.          aspirin 81 MG EC tablet Take 1 tablet (81 mg total) by mouth 2 (two) times daily with a meal.   bisacodyl 5 MG EC tablet Commonly known as:  DULCOLAX Take 1 tablet (5 mg total) by mouth daily as needed for moderate constipation.   carvedilol 3.125 MG tablet Commonly known as:  COREG Take 1 tablet (3.125 mg total) by mouth 2 (two) times daily with a meal.   feeding supplement (PRO-STAT SUGAR FREE 64) Liqd Take 30 mLs by  mouth 2 (two) times daily.   feeding supplement Liqd Take 1 Container by mouth 2 (two) times daily between meals.   ferrous sulfate 325 (65 FE) MG tablet Take 325 mg by mouth daily with breakfast.   HYDROcodone-acetaminophen 5-325 MG tablet Commonly known as:  NORCO/VICODIN Take 1 tablet by mouth every 8 (eight) hours as needed for moderate pain or severe pain.   memantine 10 MG tablet Commonly known as:  NAMENDA Take 10 mg by mouth 2 (two) times daily.   mirtazapine 15 MG tablet Commonly known as:  REMERON Take 15 mg by mouth at bedtime.     multivitamin tablet Take 1 tablet by mouth daily.   saccharomyces boulardii 250 MG capsule Commonly known as:  FLORASTOR Take 250 mg by mouth 2 (two) times daily.   UNABLE TO FIND Med Name: Magic Cup Give 1 cup by mouth 2 times daily with lunch and dinner due to weight loss       No orders of the defined types were placed in this encounter.   Immunization History  Administered Date(s) Administered  . Influenza,inj,Quad PF,36+ Mos 07/19/2016  . PPD Test 05/28/2016    Social History  Substance Use Topics  . Smoking status: Never Smoker  . Smokeless tobacco: Never Used  . Alcohol use No    Review of Systems  DATA OBTAINED: from nurse GENERAL:  no fevers, fatigue, appetite changes SKIN: No itching, rash HEENT: No complaint RESPIRATORY: No cough, wheezing, SOB CARDIAC: No chest pain, palpitations, lower extremity edema  GI: No abdominal pain, No N/V/D or constipation, No heartburn or reflux  GU: No dysuria, frequency or urgency, or incontinence  MUSCULOSKELETAL: No unrelieved bone/joint pain NEUROLOGIC: No headache, dizziness  PSYCHIATRIC: No overt anxiety or sadness  Vitals:   02/20/17 1206  BP: 117/75  Pulse: 88  Resp: 18  Temp: 97.9 F (36.6 C)   Body mass index is 18.16 kg/m. Physical Exam  GENERAL APPEARANCE: Alert, conversant, No acute distress  SKIN: No diaphoresis rash HEENT: Unremarkable RESPIRATORY: Breathing is even, unlabored. Lung sounds are clear   CARDIOVASCULAR: Heart RRR no murmurs, rubs or gallops. No peripheral edema  GASTROINTESTINAL: Abdomen is soft, non-tender, not distended w/ normal bowel sounds.  GENITOURINARY: Bladder non tender, not distended  MUSCULOSKELETAL: No abnormal joints or musculature NEUROLOGIC: Cranial nerves 2-12 grossly intact. Moves all extremities PSYCHIATRIC: Mood and affect with dementia, no behavioral issues  Patient Active Problem List   Diagnosis Date Noted  . 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  FTT (failure to thrive) in adult 08/04/2016  . Depression 07/28/2016  . Leukocytosis 07/17/2016  . Pressure injury of skin 07/17/2016  . Malnutrition of moderate degree 07/17/2016  . C. difficile diarrhea 07/07/2016  . Pedal edema 06/27/2016  . Postoperative anemia due to acute blood loss 06/10/2016  . Newly recognized heart murmur 06/10/2016  . Acute renal failure superimposed on stage 3 chronic kidney disease (HCC) 06/10/2016  . Chronic combined systolic and diastolic CHF (congestive heart failure) (HCC) 05/28/2016  . Protein-calorie malnutrition, severe 05/25/2016  . Left displaced femoral neck fracture (HCC) 05/25/2016  . Fall 05/24/2016  . Hypokalemia 05/24/2016  . AKI (acute kidney injury) (HCC) 05/24/2016  . Hypertensive heart disease with chronic combined systolic and diastolic congestive heart failure (HCC)   . Dementia   . Depression with anxiety   . HLD (hyperlipidemia)   . Dysphagia due to recent stroke 05/31/2015  . Hyperlipidemia 05/30/2015  . Vitamin D deficiency 05/30/2015  . Acute CVA (  cerebrovascular accident) (HCC)   . Hx of arterial ischemic stroke 05/21/2015  . CKD (chronic kidney disease) stage 3, GFR 30-59 ml/min 05/21/2015  . Dementia 05/21/2015  . Hypertension 05/21/2015  . UTI (urinary tract infection) 05/25/2013  . Acute renal failure (HCC) 05/25/2013  . Syncope 05/24/2013    CMP     Component Value Date/Time   NA 138 01/03/2017 1526   NA 138 01/03/2017   K 5.4 (H) 01/03/2017 1526   CL 105 01/03/2017 1526   CO2 23 01/03/2017 1526   GLUCOSE 103 (H) 01/03/2017 1526   BUN 40 (H) 01/03/2017 1526   BUN 40 (A) 01/03/2017   CREATININE 1.55 (H) 01/03/2017 1526   CALCIUM 9.2 01/03/2017 1526   PROT 6.9 01/03/2017 1526   ALBUMIN 3.2 (L) 01/03/2017 1526   AST 36 01/03/2017 1526   ALT 9 (L) 01/03/2017 1526   ALKPHOS 72 01/03/2017 1526   BILITOT 1.1 01/03/2017 1526   GFRNONAA 27 (L) 01/03/2017 1526   GFRAA 31 (L) 01/03/2017 1526    Recent Labs   07/18/16 0420 07/18/16 0430  07/19/16 0319 09/18/16 12/30/16 01/03/17 01/03/17 1526  NA 143  --   < > 142 139 146 138 138  K 3.1*  --   --  4.2 4.7 5.1  --  5.4*  CL 115*  --   --  115*  --   --   --  105  CO2 22  --   --  21*  --   --   --  23  GLUCOSE 91  --   --  87  --   --   --  103*  BUN 16  --   < > 11 25* 31* 40* 40*  CREATININE 1.31*  --   < > 1.21* 1.3* 1.5* 1.6* 1.55*  CALCIUM 8.5*  --   --  8.6*  --   --   --  9.2  MG  --  1.8  --   --   --   --   --   --   < > = values in this interval not displayed.  Recent Labs  07/16/16 2214 09/18/16 12/30/16 01/03/17 1526  AST 19 15 14  36  ALT 10* 13 9 9*  ALKPHOS 72 79 71 72  BILITOT 0.9  --   --  1.1  PROT 6.2*  --   --  6.9  ALBUMIN 2.8*  --   --  3.2*    Recent Labs  05/23/16 2336  07/16/16 2214  07/17/16 0215  07/18/16 0420 09/18/16 12/30/16 01/03/17 01/03/17 1526  WBC 10.8*  < > 22.1*  < > 20.2*  < > 9.6 9.8 5.8 7.6 7.6  NEUTROABS 9.6*  --  19.7*  --   --   --   --   --   --   --   --   HGB 11.9*  < > 10.0*  < > 10.1*  --  8.1* 11.0* 10.8*  --  10.5*  HCT 35.8*  < > 31.2*  < > 31.7*  --  25.1* 36 34*  --  32.8*  MCV 81.9  < > 81.9  --  82.6  --  82.3  --   --   --  84.8  PLT 178  < > 347  < > 342  --  278 217 251  --  227  < > = values in this interval not displayed.  Recent Labs  06/28/16  12/30/16  CHOL 147 178  LDLCALC 73 107  TRIG 61 86   No results found for: [facility] Lab Results  Component Value Date   TSH 3.40 12/30/2016   Lab Results  Component Value Date   HGBA1C 5.8 12/30/2016   Lab Results  Component Value Date   CHOL 178 12/30/2016   HDL 54 12/30/2016   LDLCALC 107 12/30/2016   TRIG 86 12/30/2016   CHOLHDL 5.4 05/22/2015    Significant Diagnostic Results in last 30 days:  No results found.  Assessment and Plan  SYNCOPE- no precipitating causes, per nursing that was in the dining room there were no complaints of dizziness or lightheadedness patient was just noted to be  unresponsive; history of same recently in the past; patient was taken to EGD at that time because she complained of chest pain; patient was diagnosed with UTI at that time but I doubt that that was really the problem; today patient has no complaints vital signs are stable physical exam is unremarkable; we will monitor patient for now     Randon Goldsmith. [name], MD

## 2017-03-09 ENCOUNTER — Encounter: Payer: Self-pay | Admitting: Internal Medicine

## 2017-03-20 ENCOUNTER — Encounter: Payer: Self-pay | Admitting: Internal Medicine

## 2017-03-20 NOTE — Assessment & Plan Note (Signed)
Despite her illness patient's dementia has been stable without major; plan to continue Namenda 10 mg by mouth twice a day

## 2017-03-20 NOTE — Assessment & Plan Note (Signed)
Patient continued to improve; has had no recent infections; will continue to monitor.

## 2017-03-20 NOTE — Assessment & Plan Note (Signed)
Stable; continue Coreg 3.125 mg by mouth twice a day

## 2017-03-24 ENCOUNTER — Encounter: Payer: Self-pay | Admitting: Internal Medicine

## 2017-03-24 ENCOUNTER — Non-Acute Institutional Stay (SKILLED_NURSING_FACILITY): Payer: Medicare Other | Admitting: Internal Medicine

## 2017-03-24 DIAGNOSIS — E785 Hyperlipidemia, unspecified: Secondary | ICD-10-CM

## 2017-03-24 DIAGNOSIS — I5042 Chronic combined systolic (congestive) and diastolic (congestive) heart failure: Secondary | ICD-10-CM

## 2017-03-24 DIAGNOSIS — N183 Chronic kidney disease, stage 3 unspecified: Secondary | ICD-10-CM

## 2017-03-24 NOTE — Progress Notes (Signed)
[facility] Room Number: [phone] Place of Service:  SNF [phone]  Margit HanksAlexander, Valori Hollenkamp D, MD  Patient Care Team: Margit HanksAlexander, Yitzhak Awan D, MD as PCP - General (Internal Medicine) Pearson GrippeKim, James, MD (Internal Medicine)  Extended Emergency Contact Information Primary Emergency Contact: [facility],Shirley Address: [address]          [place], [place] [address] [place] Home Phone: [phone] Mobile Phone: [phone] Relation: Daughter Secondary Emergency Contact: Asa Saunasliver,Cynthia  [place] Home Phone: [phone] Relation: Grandaughter    Allergies: Penicillins and Penicillins  Chief Complaint  Patient presents with  . Medical Management of Chronic Issues    routine visit    HPI: Patient is 83 y.o. female who Is being seen for routine issues of hyperlipidemia chronic kidney disease stage III and congestive heart failure.  Past Medical History:  Diagnosis Date  . Acute CVA (cerebrovascular accident) (HCC)   . AKI (acute kidney injury) (HCC)   . CKD (chronic kidney disease) stage 3, GFR 30-59 ml/min 05/21/2015  . Closed left hip fracture (HCC)   . Coronary artery disease   . Dementia   . Dementia   . Depression with anxiety   . Dysphagia due to recent stroke 05/31/2015  . Fall   . HLD (hyperlipidemia)   . Hx of arterial ischemic stroke 05/21/2015  . Hyperlipidemia   . Hypertension   . Hypokalemia   . Left displaced femoral neck fracture (HCC) 05/25/2016  . Protein-calorie malnutrition, severe 05/25/2016  . Syncope 05/24/2013  . Transient ischemic attack (TIA)   . Vitamin D deficiency 05/30/2015    Past Surgical History:  Procedure Laterality Date  . ABDOMINAL HYSTERECTOMY    . ANTERIOR APPROACH HEMI HIP ARTHROPLASTY Left 05/25/2016   Procedure: ANTERIOR APPROACH HEMI HIP ARTHROPLASTY;  Surgeon: Samson FredericBrian Swinteck, MD;  Location: [facility];  Service: Orthopedics;  Laterality: Left;    Allergies as of  03/24/2017      Reactions   Penicillins Swelling   Facial and hand swelling per daughter Has patient had a PCN reaction causing immediate rash, facial/tongue/throat swelling, SOB or lightheadedness with hypotension: Yes Has patient had a PCN reaction causing severe rash involving mucus membranes or skin necrosis: No Has patient had a PCN reaction that required hospitalization No Has patient had a PCN reaction occurring within the last 10 years: No If all of the above answers are "NO", then may proceed with Cephalosporin use.   Penicillins Swelling   Has patient had a PCN reaction causing immediate rash, facial/tongue/throat swelling, SOB or lightheadedness with hypotension: Yes Has patient had a PCN reaction causing severe rash involving mucus membranes or skin necrosis: No Has patient had a PCN reaction that required hospitalization No Has patient had a PCN reaction occurring within the last 10 years: No If all of the above answers are "NO", then may proceed with Cephalosporin use.      Medication List       Accurate as of 03/24/17 11:59 PM. Always use your most recent med list.          aspirin 81 MG EC tablet Take 1 tablet (81 mg total) by mouth 2 (two) times daily with a meal.   bisacodyl 5 MG EC tablet Commonly known as:  DULCOLAX Take 1 tablet (5 mg total) by mouth daily as needed for moderate constipation.   carvedilol 3.125 MG tablet Commonly known as:  COREG Take 1 tablet (3.125 mg total) by mouth  2 (two) times daily with a meal.   feeding supplement (PRO-STAT SUGAR FREE 64) Liqd Take 30 mLs by mouth 2 (two) times daily.   feeding supplement Liqd Take 1 Container by mouth 2 (two) times daily between meals.   ferrous sulfate 325 (65 FE) MG tablet Take 325 mg by mouth daily with breakfast.   HYDROcodone-acetaminophen 5-325 MG tablet Commonly known as:  NORCO/VICODIN Take 1 tablet by mouth every 8 (eight) hours as needed for moderate pain or severe pain.     memantine 10 MG tablet Commonly known as:  NAMENDA Take 10 mg by mouth 2 (two) times daily.   mirtazapine 15 MG tablet Commonly known as:  REMERON Take 15 mg by mouth. Take 1/2 tablet (7.5 mg) at bedtime   multivitamin tablet Take 1 tablet by mouth daily.   saccharomyces boulardii 250 MG capsule Commonly known as:  FLORASTOR Take 250 mg by mouth 2 (two) times daily.   UNABLE TO FIND Med Name: Magic Cup Give 1 cup by mouth 2 times daily with lunch and dinner due to weight loss       No orders of the defined types were placed in this encounter.   Immunization History  Administered Date(s) Administered  . Influenza,inj,Quad PF,36+ Mos 07/19/2016  . PPD Test 05/28/2016    Social History  Substance Use Topics  . Smoking status: Never Smoker  . Smokeless tobacco: Never Used  . Alcohol use No    Review of Systems Unable to obtain secondary to dementia; nursing-no concerns    Vitals:   03/24/17 1555  BP: 117/75  Pulse: 87  Resp: 18  Temp: 97.9 F (36.6 C)   Body mass index is 18.02 kg/m. Physical Exam  GENERAL APPEARANCE: Alert, Minimally conversant, No acute distress  SKIN: No diaphoresis rash HEENT: Unremarkable RESPIRATORY: Breathing is even, unlabored. Lung sounds are clear   CARDIOVASCULAR: Heart RRR no murmurs, rubs or gallops. No peripheral edema  GASTROINTESTINAL: Abdomen is soft, non-tender, not distended w/ normal bowel sounds.  GENITOURINARY: Bladder non tender, not distended  MUSCULOSKELETAL: No abnormal joints or musculature NEUROLOGIC: Cranial nerves 2-12 grossly intact. Moves all extremities PSYCHIATRIC: Mood and affect with dementia, no behavioral issues  Patient Active Problem List   Diagnosis Date Noted  . 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 FTT (failure to thrive) in adult 08/04/2016  . Depression 07/28/2016  . Leukocytosis 07/17/2016  . Pressure injury of skin 07/17/2016  . Malnutrition of moderate degree 07/17/2016  . C.  difficile diarrhea 07/07/2016  . Pedal edema 06/27/2016  . Postoperative anemia due to acute blood loss 06/10/2016  . Newly recognized heart murmur 06/10/2016  . Acute renal failure superimposed on stage 3 chronic kidney disease (HCC) 06/10/2016  . Chronic combined systolic and diastolic CHF (congestive heart failure) (HCC) 05/28/2016  . Protein-calorie malnutrition, severe 05/25/2016  . Left displaced femoral neck fracture (HCC) 05/25/2016  . Fall 05/24/2016  . Hypokalemia 05/24/2016  . AKI (acute kidney injury) (HCC) 05/24/2016  . Hypertensive heart disease with chronic combined systolic and diastolic congestive heart failure (HCC)   . Dementia   . Depression with anxiety   . HLD (hyperlipidemia)   . Dysphagia due to recent stroke 05/31/2015  . Hyperlipidemia 05/30/2015  . Vitamin D deficiency 05/30/2015  . Acute CVA (cerebrovascular accident) (HCC)   . Hx of arterial ischemic stroke 05/21/2015  . CKD (chronic kidney disease) stage 3, GFR 30-59 ml/min 05/21/2015  . Dementia 05/21/2015  . Hypertension 05/21/2015  . UTI (  urinary tract infection) 05/25/2013  . Acute renal failure (HCC) 05/25/2013  . Syncope 05/24/2013    CMP     Component Value Date/Time   NA 138 01/03/2017 1526   NA 138 01/03/2017   K 5.4 (H) 01/03/2017 1526   CL 105 01/03/2017 1526   CO2 23 01/03/2017 1526   GLUCOSE 103 (H) 01/03/2017 1526   BUN 40 (H) 01/03/2017 1526   BUN 40 (A) 01/03/2017   CREATININE 1.55 (H) 01/03/2017 1526   CALCIUM 9.2 01/03/2017 1526   PROT 6.9 01/03/2017 1526   ALBUMIN 3.2 (L) 01/03/2017 1526   AST 36 01/03/2017 1526   ALT 9 (L) 01/03/2017 1526   ALKPHOS 72 01/03/2017 1526   BILITOT 1.1 01/03/2017 1526   GFRNONAA 27 (L) 01/03/2017 1526   GFRAA 31 (L) 01/03/2017 1526    Recent Labs  07/18/16 0420 07/18/16 0430  07/19/16 0319 09/18/16 12/30/16 01/03/17 01/03/17 1526  NA 143  --   < > 142 139 146 138 138  K 3.1*  --   --  4.2 4.7 5.1  --  5.4*  CL 115*  --   --  115*   --   --   --  105  CO2 22  --   --  21*  --   --   --  23  GLUCOSE 91  --   --  87  --   --   --  103*  BUN 16  --   < > 11 25* 31* 40* 40*  CREATININE 1.31*  --   < > 1.21* 1.3* 1.5* 1.6* 1.55*  CALCIUM 8.5*  --   --  8.6*  --   --   --  9.2  MG  --  1.8  --   --   --   --   --   --   < > = values in this interval not displayed.  Recent Labs  07/16/16 2214 09/18/16 12/30/16 01/03/17 1526  AST 19 15 14  36  ALT 10* 13 9 9*  ALKPHOS 72 79 71 72  BILITOT 0.9  --   --  1.1  PROT 6.2*  --   --  6.9  ALBUMIN 2.8*  --   --  3.2*    Recent Labs  05/23/16 2336  07/16/16 2214  07/17/16 0215  07/18/16 0420 09/18/16 12/30/16 01/03/17 01/03/17 1526  WBC 10.8*  < > 22.1*  < > 20.2*  < > 9.6 9.8 5.8 7.6 7.6  NEUTROABS 9.6*  --  19.7*  --   --   --   --   --   --   --   --   HGB 11.9*  < > 10.0*  < > 10.1*  --  8.1* 11.0* 10.8*  --  10.5*  HCT 35.8*  < > 31.2*  < > 31.7*  --  25.1* 36 34*  --  32.8*  MCV 81.9  < > 81.9  --  82.6  --  82.3  --   --   --  84.8  PLT 178  < > 347  < > 342  --  278 217 251  --  227  < > = values in this interval not displayed.  Recent Labs  06/28/16 12/30/16  CHOL 147 178  LDLCALC 73 107  TRIG 61 86   No results found for: [facility] Lab Results  Component Value Date   TSH 3.40 12/30/2016   Lab Results  Component Value Date   HGBA1C 5.8 12/30/2016   Lab Results  Component Value Date   CHOL 178 12/30/2016   HDL 54 12/30/2016   LDLCALC 107 12/30/2016   TRIG 86 12/30/2016   CHOLHDL 5.4 05/22/2015    Significant Diagnostic Results in last 30 days:  No results found.  Assessment and Plan  Hyperlipidemia LDL 107;HDL - 54 on no meds; acceptable to this age pt; will monitor at intervals  CKD (chronic kidney disease) stage 3, GFR 30-59 ml/min No recent GFR; BUN is 40 creatinine 1.6 which is stable from prior; we'll monitor intervals  Chronic combined systolic and diastolic CHF (congestive heart failure) (HCC) No reported exacerbations;  stable; patient is on no diuretics, we'll plan to continue Coreg 3.125 mg by mouth twice a day    Farron Lafond D. [name], MD

## 2017-03-26 ENCOUNTER — Non-Acute Institutional Stay (SKILLED_NURSING_FACILITY): Payer: Medicare Other

## 2017-03-26 DIAGNOSIS — Z0000 Encounter for general adult medical examination without abnormal findings: Secondary | ICD-10-CM

## 2017-03-26 NOTE — Patient Instructions (Signed)
Ariel Douglas , Thank you for taking time to come for your Medicare Wellness Visit. I appreciate your ongoing commitment to your health goals. Please review the following plan we discussed and let me know if I can assist you in the future.   Screening recommendations/referrals: Colonoscopy excluded, pt over age 81 Mammogram excluded, pt over age 175 Bone Density due, ordered Recommended yearly ophthalmology/optometry visit for glaucoma screening and checkup Recommended yearly dental visit for hygiene and checkup  Vaccinations: Influenza vaccine due 2018 fall season Pneumococcal vaccine due, ordered Tdap vaccine due, ordered Shingles vaccine not in records  Advanced directives: DNR in chart, copies of health care power of attorney and living will are needed   Conditions/risks identified: None  Next appointment: Dr. Lyn HollingsheadAlexander makes rounds   Preventive Care 65 Years and Older, Female Preventive care refers to lifestyle choices and visits with your health care provider that can promote health and wellness. What does preventive care include?  A yearly physical exam. This is also called an annual well check.  Dental exams once or twice a year.  Routine eye exams. Ask your health care provider how often you should have your eyes checked.  Personal lifestyle choices, including:  Daily care of your teeth and gums.  Regular physical activity.  Eating a healthy diet.  Avoiding tobacco and drug use.  Limiting alcohol use.  Practicing safe sex.  Taking low-dose aspirin every day.  Taking vitamin and mineral supplements as recommended by your health care provider. What happens during an annual well check? The services and screenings done by your health care provider during your annual well check will depend on your age, overall health, lifestyle risk factors, and family history of disease. Counseling  Your health care provider may ask you questions about your:  Alcohol  use.  Tobacco use.  Drug use.  Emotional well-being.  Home and relationship well-being.  Sexual activity.  Eating habits.  History of falls.  Memory and ability to understand (cognition).  Work and work Astronomerenvironment.  Reproductive health. Screening  You may have the following tests or measurements:  Height, weight, and BMI.  Blood pressure.  Lipid and cholesterol levels. These may be checked every 5 years, or more frequently if you are over 29103 years old.  Skin check.  Lung cancer screening. You may have this screening every year starting at age 81 if you have a 30-pack-year history of smoking and currently smoke or have quit within the past 15 years.  Fecal occult blood test (FOBT) of the stool. You may have this test every year starting at age 81.  Flexible sigmoidoscopy or colonoscopy. You may have a sigmoidoscopy every 5 years or a colonoscopy every 10 years starting at age 81.  Hepatitis C blood test.  Hepatitis B blood test.  Sexually transmitted disease (STD) testing.  Diabetes screening. This is done by checking your blood sugar (glucose) after you have not eaten for a while (fasting). You may have this done every 1-3 years.  Bone density scan. This is done to screen for osteoporosis. You may have this done starting at age 81.  Mammogram. This may be done every 1-2 years. Talk to your health care provider about how often you should have regular mammograms. Talk with your health care provider about your test results, treatment options, and if necessary, the need for more tests. Vaccines  Your health care provider may recommend certain vaccines, such as:  Influenza vaccine. This is recommended every year.  Tetanus, diphtheria,  and acellular pertussis (Tdap, Td) vaccine. You may need a Td booster every 10 years.  Zoster vaccine. You may need this after age 81.  Pneumococcal 13-valent conjugate (PCV13) vaccine. One dose is recommended after age  29.  Pneumococcal polysaccharide (PPSV23) vaccine. One dose is recommended after age 35. Talk to your health care provider about which screenings and vaccines you need and how often you need them. This information is not intended to replace advice given to you by your health care provider. Make sure you discuss any questions you have with your health care provider. Document Released: 09/15/2015 Document Revised: 05/08/2016 Document Reviewed: 06/20/2015 Elsevier Interactive Patient Education  2017 [employer] Prevention in the Home Falls can cause injuries. They can happen to people of all ages. There are many things you can do to make your home safe and to help prevent falls. What can I do on the outside of my home?  Regularly fix the edges of walkways and driveways and fix any cracks.  Remove anything that might make you trip as you walk through a door, such as a raised step or threshold.  Trim any bushes or trees on the path to your home.  Use bright outdoor lighting.  Clear any walking paths of anything that might make someone trip, such as rocks or tools.  Regularly check to see if handrails are loose or broken. Make sure that both sides of any steps have handrails.  Any raised decks and porches should have guardrails on the edges.  Have any leaves, snow, or ice cleared regularly.  Use sand or salt on walking paths during winter.  Clean up any spills in your garage right away. This includes oil or grease spills. What can I do in the bathroom?  Use night lights.  Install grab bars by the toilet and in the tub and shower. Do not use towel bars as grab bars.  Use non-skid mats or decals in the tub or shower.  If you need to sit down in the shower, use a plastic, non-slip stool.  Keep the floor dry. Clean up any water that spills on the floor as soon as it happens.  Remove soap buildup in the tub or shower regularly.  Attach bath mats securely with double-sided  non-slip rug tape.  Do not have throw rugs and other things on the floor that can make you trip. What can I do in the bedroom?  Use night lights.  Make sure that you have a light by your bed that is easy to reach.  Do not use any sheets or blankets that are too big for your bed. They should not hang down onto the floor.  Have a firm chair that has side arms. You can use this for support while you get dressed.  Do not have throw rugs and other things on the floor that can make you trip. What can I do in the kitchen?  Clean up any spills right away.  Avoid walking on wet floors.  Keep items that you use a lot in easy-to-reach places.  If you need to reach something above you, use a strong step stool that has a grab bar.  Keep electrical cords out of the way.  Do not use floor polish or wax that makes floors slippery. If you must use wax, use non-skid floor wax.  Do not have throw rugs and other things on the floor that can make you trip. What can I do with my  stairs?  Do not leave any items on the stairs.  Make sure that there are handrails on both sides of the stairs and use them. Fix handrails that are broken or loose. Make sure that handrails are as long as the stairways.  Check any carpeting to make sure that it is firmly attached to the stairs. Fix any carpet that is loose or worn.  Avoid having throw rugs at the top or bottom of the stairs. If you do have throw rugs, attach them to the floor with carpet tape.  Make sure that you have a light switch at the top of the stairs and the bottom of the stairs. If you do not have them, ask someone to add them for you. What else can I do to help prevent falls?  Wear shoes that:  Do not have high heels.  Have rubber bottoms.  Are comfortable and fit you well.  Are closed at the toe. Do not wear sandals.  If you use a stepladder:  Make sure that it is fully opened. Do not climb a closed stepladder.  Make sure that both  sides of the stepladder are locked into place.  Ask someone to hold it for you, if possible.  Clearly mark and make sure that you can see:  Any grab bars or handrails.  First and last steps.  Where the edge of each step is.  Use tools that help you move around (mobility aids) if they are needed. These include:  Canes.  Walkers.  Scooters.  Crutches.  Turn on the lights when you go into a dark area. Replace any light bulbs as soon as they burn out.  Set up your furniture so you have a clear path. Avoid moving your furniture around.  If any of your floors are uneven, fix them.  If there are any pets around you, be aware of where they are.  Review your medicines with your doctor. Some medicines can make you feel dizzy. This can increase your chance of falling. Ask your doctor what other things that you can do to help prevent falls. This information is not intended to replace advice given to you by your health care provider. Make sure you discuss any questions you have with your health care provider. Document Released: 06/15/2009 Document Revised: 01/25/2016 Document Reviewed: 09/23/2014 Elsevier Interactive Patient Education  2017 [employer].

## 2017-03-26 NOTE — Progress Notes (Signed)
Subjective:   Ariel Douglas is a 83 y.o. female who presents for Medicare Annual (Subsequent) preventive examination at [facility];incapacitated patient unable to answer questions appropriately  Last AWV- 03/02/13    Objective:     Vitals: BP 130/64 (BP Location: Right Arm, Patient Position: Sitting)   Pulse (!) 50   Temp (!) 96.7 F (35.9 C) (Axillary)   Ht 5\' 4"  (1.626 m)   Wt 106 lb (48.1 kg)   SpO2 95%   BMI 18.19 kg/m   Body mass index is 18.19 kg/m.   Tobacco History  Smoking Status  . Never Smoker  Smokeless Tobacco  . Never Used     Counseling given: Not Answered   Past Medical History:  Diagnosis Date  . Acute CVA (cerebrovascular accident) (HCC)   . AKI (acute kidney injury) (HCC)   . CKD (chronic kidney disease) stage 3, GFR 30-59 ml/min 05/21/2015  . Closed left hip fracture (HCC)   . Coronary artery disease   . Dementia   . Dementia   . Depression with anxiety   . Dysphagia due to recent stroke 05/31/2015  . Fall   . HLD (hyperlipidemia)   . Hx of arterial ischemic stroke 05/21/2015  . Hyperlipidemia   . Hypertension   . Hypokalemia   . Left displaced femoral neck fracture (HCC) 05/25/2016  . Protein-calorie malnutrition, severe 05/25/2016  . Syncope 05/24/2013  . Transient ischemic attack (TIA)   . Vitamin D deficiency 05/30/2015   Past Surgical History:  Procedure Laterality Date  . ABDOMINAL HYSTERECTOMY    . ANTERIOR APPROACH HEMI HIP ARTHROPLASTY Left 05/25/2016   Procedure: ANTERIOR APPROACH HEMI HIP ARTHROPLASTY;  Surgeon: Samson FredericBrian Swinteck, MD;  Location: [facility];  Service: Orthopedics;  Laterality: Left;   Family History  Problem Relation Age of Onset  . Asthma Other   . Heart disease Other    History  Sexual Activity  . Sexual activity: No    Outpatient Encounter Prescriptions as of 03/26/2017  Medication Sig  . Amino Acids-Protein Hydrolys (FEEDING SUPPLEMENT, PRO-STAT SUGAR FREE 64,) LIQD Take 30 mLs by mouth 2  (two) times daily.   Marland Kitchen. aspirin EC 81 MG EC tablet Take 1 tablet (81 mg total) by mouth 2 (two) times daily with a meal.  . bisacodyl (DULCOLAX) 5 MG EC tablet Take 1 tablet (5 mg total) by mouth daily as needed for moderate constipation.  . carvedilol (COREG) 3.125 MG tablet Take 1 tablet (3.125 mg total) by mouth 2 (two) times daily with a meal.  . feeding supplement (BOOST / RESOURCE BREEZE) LIQD Take 1 Container by mouth 2 (two) times daily between meals.  . ferrous sulfate 325 (65 FE) MG tablet Take 325 mg by mouth daily with breakfast.   . HYDROcodone-acetaminophen (NORCO/VICODIN) 5-325 MG tablet Take 1 tablet by mouth every 8 (eight) hours as needed for moderate pain or severe pain.  . memantine (NAMENDA) 10 MG tablet Take 10 mg by mouth 2 (two) times daily.  . mirtazapine (REMERON) 15 MG tablet Take 15 mg by mouth. Take 1/2 tablet (7.5 mg) at bedtime  . Multiple Vitamin (MULTIVITAMIN) tablet Take 1 tablet by mouth daily.  Marland Kitchen. saccharomyces boulardii (FLORASTOR) 250 MG capsule Take 250 mg by mouth 2 (two) times daily.  Marland Kitchen. UNABLE TO FIND Med Name: Magic Cup Give 1 cup by mouth 2 times daily with lunch and dinner due to weight loss   No facility-administered encounter medications on file as of 03/26/2017.  Activities of Daily Living In your present state of health, do you have any difficulty performing the following activities: 03/26/2017 07/17/2016  Hearing? Y N  Vision? N N  Difficulty concentrating or making decisions? Malvin JohnsY Y  Walking or climbing stairs? Y Y  Dressing or bathing? Y Y  Doing errands, shopping? Malvin JohnsY Y  Preparing Food and eating ? Y -  Using the Toilet? Y -  In the past six months, have you accidently leaked urine? Y -  Do you have problems with loss of bowel control? Y -  Managing your Medications? Y -  Managing your Finances? Y -  Housekeeping or managing your Housekeeping? Y -  Some recent data might be hidden    Patient Care Team: Margit HanksAlexander, Anne D, MD as PCP -  General (Internal Medicine) Pearson GrippeKim, James, MD (Internal Medicine)    Assessment:     Exercise Activities and Dietary recommendations Current Exercise Habits: The patient does not participate in regular exercise at present, Exercise limited by: neurologic condition(s)  Goals    None     Fall Risk Fall Risk  03/26/2017  Falls in the past year? No   Depression Screen PHQ 2/9 Scores 03/26/2017  PHQ - 2 Score 0     Cognitive Function     6CIT Screen 03/26/2017  What Year? 4 points  What month? 3 points  What time? 3 points  Count back from 20 0 points  Months in reverse 0 points  Repeat phrase 6 points  Total Score 16    Immunization History  Administered Date(s) Administered  . Influenza,inj,Quad PF,36+ Mos 07/19/2016  . PPD Test 05/28/2016   Screening Tests Health Maintenance  Topic Date Due  . PNA vac Low Risk Adult (1 of 2 - PCV13) 12/12/1985  . DEXA SCAN  09/27/2017 (Originally [birth year]/12/1985)  . TETANUS/TDAP  09/03/2023 (Originally 12/13/1937)  . INFLUENZA VACCINE  04/02/2017      Plan:    I have personally reviewed and addressed the Medicare Annual Wellness questionnaire and have noted the following in the patient's chart:  A. Medical and social history B. Use of alcohol, tobacco or illicit drugs  C. Current medications and supplements D. Functional ability and status E.  Nutritional status F.  Physical activity G. Advance directives H. List of other physicians I.  Hospitalizations, surgeries, and ER visits in previous 12 months J.  Vitals K. Screenings to include hearing, vision, cognitive, depression L. Referrals and appointments - none  In addition, unable to review and discuss with incapacitated patient certain preventive protocols, quality metrics, and best practice recommendations. A written personalized care plan for preventive services as well as general preventive health recommendations were provided to patient.  See attached scanned questionnaire for  additional information.   Signed,   Annetta MawSara Gonthier, RN Nurse Health Advisor   Quick Notes   Health Maintenance: PNA 13, DEXA and TDAP due     Abnormal Screen: 6 CIT-16     Patient Concerns: None     Nurse Concerns: None

## 2017-04-20 ENCOUNTER — Encounter: Payer: Self-pay | Admitting: Internal Medicine

## 2017-04-20 NOTE — Assessment & Plan Note (Signed)
No recent GFR; BUN is 40 creatinine 1.6 which is stable from prior; we'll monitor intervals

## 2017-04-20 NOTE — Assessment & Plan Note (Signed)
LDL 107;HDL - 54 on no meds; acceptable to this age pt; will monitor at intervals

## 2017-04-20 NOTE — Assessment & Plan Note (Signed)
No reported exacerbations; stable; patient is on no diuretics, we'll plan to continue Coreg 3.125 mg by mouth twice a day

## 2017-06-04 ENCOUNTER — Non-Acute Institutional Stay (SKILLED_NURSING_FACILITY): Payer: Medicare Other | Admitting: Internal Medicine

## 2017-06-04 ENCOUNTER — Encounter: Payer: Self-pay | Admitting: Internal Medicine

## 2017-06-04 DIAGNOSIS — I5042 Chronic combined systolic (congestive) and diastolic (congestive) heart failure: Secondary | ICD-10-CM

## 2017-06-04 DIAGNOSIS — G301 Alzheimer's disease with late onset: Secondary | ICD-10-CM

## 2017-06-04 DIAGNOSIS — Z8673 Personal history of transient ischemic attack (TIA), and cerebral infarction without residual deficits: Secondary | ICD-10-CM

## 2017-06-04 DIAGNOSIS — F0280 Dementia in other diseases classified elsewhere without behavioral disturbance: Secondary | ICD-10-CM

## 2017-06-04 DIAGNOSIS — I110 Hypertensive heart disease with heart failure: Secondary | ICD-10-CM

## 2017-06-04 NOTE — Progress Notes (Signed)
Location:  [facility] Room Number: [phone] Place of Service:  SNF [phone])  Margit Hanks, MD  Patient Care Team: Margit Hanks, MD as PCP - General (Internal Medicine) [name], MD (Internal Medicine)  Extended Emergency Contact Information Primary Emergency Contact: [facility] Address: [address]          [place], [place] [address] [place] Home Phone: [phone] Mobile Phone: [phone] Relation: Daughter Secondary Emergency Contact: Asa Saunas [place] Home Phone: [phone] Relation: Grandaughter    Allergies: Penicillins and Penicillins  Chief Complaint  Patient presents with  . Medical Management of Chronic Issues    routine visit    HPI: Patient is 83 y.o. female who Is being seen for routine issues of history of ischemic stroke, hypertension, and dementia.  Past Medical History:  Diagnosis Date  . Acute CVA (cerebrovascular accident) (HCC)   . Acute renal failure (HCC) 05/25/2013  . AKI (acute kidney injury) (HCC)   . CKD (chronic kidney disease) stage 3, GFR 30-59 ml/min (HCC) 05/21/2015  . Closed left hip fracture (HCC)   . Coronary artery disease   . Dementia   . Depression with anxiety   . Dysphagia due to recent stroke 05/31/2015  . Fall   . HLD (hyperlipidemia)   . Hx of arterial ischemic stroke 05/21/2015  . Hyperlipidemia   . Hypertension   . Hypokalemia   . Left displaced femoral neck fracture (HCC) 05/25/2016  . Leukocytosis 07/17/2016  . Protein-calorie malnutrition, severe 05/25/2016  . Syncope 05/24/2013  . Transient ischemic attack (TIA)   . Vitamin D deficiency 05/30/2015    Past Surgical History:  Procedure Laterality Date  . ABDOMINAL HYSTERECTOMY    . ANTERIOR APPROACH HEMI HIP ARTHROPLASTY Left 05/25/2016   Procedure: ANTERIOR APPROACH HEMI HIP ARTHROPLASTY;  Surgeon: Samson Frederic, MD;  Location: [facility];  Service: Orthopedics;   Laterality: Left;    Allergies as of 06/04/2017      Reactions   Penicillins Swelling   Facial and hand swelling per daughter Has patient had a PCN reaction causing immediate rash, facial/tongue/throat swelling, SOB or lightheadedness with hypotension: Yes Has patient had a PCN reaction causing severe rash involving mucus membranes or skin necrosis: No Has patient had a PCN reaction that required hospitalization No Has patient had a PCN reaction occurring within the last 10 years: No If all of the above answers are "NO", then may proceed with Cephalosporin use.   Penicillins Swelling   Has patient had a PCN reaction causing immediate rash, facial/tongue/throat swelling, SOB or lightheadedness with hypotension: Yes Has patient had a PCN reaction causing severe rash involving mucus membranes or skin necrosis: No Has patient had a PCN reaction that required hospitalization No Has patient had a PCN reaction occurring within the last 10 years: No If all of the above answers are "NO", then may proceed with Cephalosporin use.      Medication List       Accurate as of 06/04/17 11:59 PM. Always use your most recent med list.          aspirin 81 MG EC tablet Take 1 tablet (81 mg total) by mouth 2 (two) times daily with a meal.   bisacodyl 5 MG EC tablet Commonly known as:  DULCOLAX Take 1 tablet (5 mg total) by mouth daily as needed for moderate constipation.   busPIRone 5 MG tablet Commonly known as:  BUSPAR Take 5 mg  by mouth. Take one tablet twice daily   carvedilol 3.125 MG tablet Commonly known as:  COREG Take 1 tablet (3.125 mg total) by mouth 2 (two) times daily with a meal.   divalproex 125 MG DR tablet Commonly known as:  DEPAKOTE Take 125 mg by mouth. Take one tablet daily for mood disorder   feeding supplement Liqd Take 1 Container by mouth 2 (two) times daily between meals.   ferrous sulfate 325 (65 FE) MG tablet Take 325 mg by mouth daily with breakfast.     HYDROcodone-acetaminophen 5-325 MG tablet Commonly known as:  NORCO/VICODIN Take 1 tablet by mouth every 8 (eight) hours as needed for moderate pain or severe pain.   memantine 10 MG tablet Commonly known as:  NAMENDA Take 10 mg by mouth 2 (two) times daily.   mirtazapine 15 MG tablet Commonly known as:  REMERON Take 15 mg by mouth. Take 1/2 tablet (7.5 mg) at bedtime   multivitamin tablet Take 1 tablet by mouth daily.   saccharomyces boulardii 250 MG capsule Commonly known as:  FLORASTOR Take 250 mg by mouth 2 (two) times daily.   UNABLE TO FIND Med Name: Magic Cup Give 1 cup by mouth 2 times daily with lunch and dinner due to weight loss       Meds ordered this encounter  Medications  . divalproex (DEPAKOTE) 125 MG DR tablet    Sig: Take 125 mg by mouth. Take one tablet daily for mood disorder  . busPIRone (BUSPAR) 5 MG tablet    Sig: Take 5 mg by mouth. Take one tablet twice daily    Immunization History  Administered Date(s) Administered  . Influenza,inj,Quad PF,6+ Mos 07/19/2016  . PPD Test 05/28/2016    Social History  Substance Use Topics  . Smoking status: Never Smoker  . Smokeless tobacco: Never Used  . Alcohol use No    Review of Systems  DATA OBTAINED: from patient-Very limited pertussis patient; nurse.-no acute concerns GENERAL:  no fevers, fatigue, appetite changes SKIN: No itching, rash HEENT: No complaint RESPIRATORY: No cough, wheezing, SOB CARDIAC: No chest pain, palpitations, lower extremity edema  GI: No abdominal pain, No N/V/D or constipation, No heartburn or reflux  GU: No dysuria, frequency or urgency, or incontinence  MUSCULOSKELETAL: No unrelieved bone/joint pain NEUROLOGIC: No headache, dizziness  PSYCHIATRIC: No overt anxiety or sadness  Vitals:   06/04/17 1309  BP: (!) 141/63  Pulse: 61  Resp: 18  Temp: (!) 97.4 F (36.3 C)   Body mass index is 21.43 kg/m. Physical Exam  GENERAL APPEARANCE: Alert, conversant, No  acute distress , very pleasant black female SKIN: No diaphoresis rash HEENT: Unremarkable RESPIRATORY: Breathing is even, unlabored. Lung sounds are clear   CARDIOVASCULAR: Heart RRR no murmurs, rubs or gallops. No peripheral edema  GASTROINTESTINAL: Abdomen is soft, non-tender, not distended w/ normal bowel sounds.  GENITOURINARY: Bladder non tender, not distended  MUSCULOSKELETAL: No abnormal joints or musculature NEUROLOGIC: Cranial nerves 2-12 grossly intact. Moves all extremities PSYCHIATRIC: Mood and affect with dementia, no behavioral issues  Patient Active Problem List   Diagnosis Date Noted  . 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 FTT (failure to thrive) in adult 08/04/2016  . Depression 07/28/2016  . Leukocytosis 07/17/2016  . Pressure injury of skin 07/17/2016  . Malnutrition of moderate degree 07/17/2016  . C. difficile diarrhea 07/07/2016  . Pedal edema 06/27/2016  . Postoperative anemia due to acute blood loss 06/10/2016  . Newly recognized heart murmur 06/10/2016  .  Acute renal failure superimposed on stage 3 chronic kidney disease (HCC) 06/10/2016  . Chronic combined systolic and diastolic CHF (congestive heart failure) (HCC) 05/28/2016  . Protein-calorie malnutrition, severe 05/25/2016  . Left displaced femoral neck fracture (HCC) 05/25/2016  . Fall 05/24/2016  . Hypokalemia 05/24/2016  . AKI (acute kidney injury) (HCC) 05/24/2016  . Hypertensive heart disease with chronic combined systolic and diastolic congestive heart failure (HCC)   . Dementia   . Depression with anxiety   . HLD (hyperlipidemia)   . Dysphagia due to recent stroke 05/31/2015  . Hyperlipidemia 05/30/2015  . Vitamin D deficiency 05/30/2015  . Acute CVA (cerebrovascular accident) (HCC)   . Hx of arterial ischemic stroke 05/21/2015  . CKD (chronic kidney disease) stage 3, GFR 30-59 ml/min (HCC) 05/21/2015  . Dementia 05/21/2015  . Hypertension 05/21/2015  . UTI (urinary tract infection)  05/25/2013  . Acute renal failure (HCC) 05/25/2013  . Syncope 05/24/2013    CMP     Component Value Date/Time   NA 138 01/03/2017 1526   NA 138 01/03/2017   K 5.4 (H) 01/03/2017 1526   CL 105 01/03/2017 1526   CO2 23 01/03/2017 1526   GLUCOSE 103 (H) 01/03/2017 1526   BUN 40 (H) 01/03/2017 1526   BUN 40 (A) 01/03/2017   CREATININE 1.55 (H) 01/03/2017 1526   CALCIUM 9.2 01/03/2017 1526   PROT 6.9 01/03/2017 1526   ALBUMIN 3.2 (L) 01/03/2017 1526   AST 36 01/03/2017 1526   ALT 9 (L) 01/03/2017 1526   ALKPHOS 72 01/03/2017 1526   BILITOT 1.1 01/03/2017 1526   GFRNONAA 27 (L) 01/03/2017 1526   GFRAA 31 (L) 01/03/2017 1526    Recent Labs  07/18/16 0420 07/18/16 0430  07/19/16 0319 09/18/16 12/30/16 01/03/17 01/03/17 1526  NA 143  --   < > 142 139 146 138 138  K 3.1*  --   --  4.2 4.7 5.1  --  5.4*  CL 115*  --   --  115*  --   --   --  105  CO2 22  --   --  21*  --   --   --  23  GLUCOSE 91  --   --  87  --   --   --  103*  BUN 16  --   < > 11 25* 31* 40* 40*  CREATININE 1.31*  --   < > 1.21* 1.3* 1.5* 1.6* 1.55*  CALCIUM 8.5*  --   --  8.6*  --   --   --  9.2  MG  --  1.8  --   --   --   --   --   --   < > = values in this interval not displayed.  Recent Labs  07/16/16 2214 09/18/16 12/30/16 01/03/17 1526  AST 36  ALT 10* 13 9 9*  ALKPHOS 72 79 71 72  BILITOT 0.9  --   --  1.1  PROT 6.2*  --   --  6.9  ALBUMIN 2.8*  --   --  3.2*    Recent Labs  07/16/16 2214  07/17/16 0215  07/18/16 0420 09/18/16 12/30/16 01/03/17 01/03/17 1526  WBC 22.1*  < > 20.2*  < > 9.6 9.8 5.8 7.6 7.6  NEUTROABS 19.7*  --   --   --   --   --   --   --   --   HGB 10.0*  < >  10.1*  --  8.1* 11.0* 10.8*  --  10.5*  HCT 31.2*  < > 31.7*  --  25.1* 36 34*  --  32.8*  MCV 81.9  --  82.6  --  82.3  --   --   --  84.8  PLT 347  < > 342  --  278 217 251  --  227  < > = values in this interval not displayed.  Recent Labs  06/28/16 12/30/16  CHOL 147 178  LDLCALC 73 107    TRIG 61 86   No results found for: [facility] Lab Results  Component Value Date   TSH 3.40 12/30/2016   Lab Results  Component Value Date   HGBA1C 5.8 12/30/2016   Lab Results  Component Value Date   CHOL 178 12/30/2016   HDL 54 12/30/2016   LDLCALC 107 12/30/2016   TRIG 86 12/30/2016   CHOLHDL 5.4 05/22/2015    Significant Diagnostic Results in last 30 days:  No results found.  Assessment and Plan  Hx of arterial ischemic stroke Stable; continue ASA 81 mg by mouth daily  Hypertensive heart disease with chronic combined systolic and diastolic congestive heart failure (HCC) Stable and chronic; continue Coreg 3.125 mg by mouth twice a day  Dementia Stable without major declines; plan to continue Namenda 10 mg by mouth twice a day    Alija Riano D. [name], MD

## 2017-06-08 ENCOUNTER — Encounter: Payer: Self-pay | Admitting: Internal Medicine

## 2017-06-08 NOTE — Assessment & Plan Note (Signed)
Stable without major declines; plan to continue Namenda 10 mg by mouth twice a day

## 2017-06-08 NOTE — Assessment & Plan Note (Signed)
Stable and chronic; continue Coreg 3.125 mg by mouth twice a day

## 2017-06-08 NOTE — Assessment & Plan Note (Signed)
Stable; continue ASA 81 mg by mouth daily 

## 2017-07-22 ENCOUNTER — Non-Acute Institutional Stay (SKILLED_NURSING_FACILITY): Payer: Medicare Other | Admitting: Internal Medicine

## 2017-07-22 ENCOUNTER — Encounter: Payer: Self-pay | Admitting: Internal Medicine

## 2017-07-22 DIAGNOSIS — N183 Chronic kidney disease, stage 3 unspecified: Secondary | ICD-10-CM

## 2017-07-22 DIAGNOSIS — I5042 Chronic combined systolic (congestive) and diastolic (congestive) heart failure: Secondary | ICD-10-CM | POA: Diagnosis not present

## 2017-07-22 DIAGNOSIS — F419 Anxiety disorder, unspecified: Secondary | ICD-10-CM | POA: Diagnosis not present

## 2017-07-22 NOTE — Progress Notes (Signed)
Location:  [facility] Room Number: [phone] Place of Service:  SNF ([phone])  Margit Hanks, MD  Patient Care Team: Margit Hanks, MD as PCP - General (Internal Medicine) [name], MD (Internal Medicine)  Extended Emergency Contact Information Primary Emergency Contact: [facility] Address: [address]          [place], [place] [address] [place] Home Phone: [phone] Mobile Phone: [phone] Relation: Daughter Secondary Emergency Contact: Asa [place] Home Phone: [phone] Relation: Grandaughter    Allergies: Penicillins and Penicillins  Chief Complaint  Patient presents with  . Medical Management of Chronic Issues    routine visit    HPI: Patient is 83 y.o. female who is being seen for routine issues of congestive heart failure, chronic kidney disease stage III, and anxiety.  Past Medical History:  Diagnosis Date  . Acute CVA (cerebrovascular accident) (HCC)   . Acute renal failure (HCC) 05/25/2013  . AKI (acute kidney injury) (HCC)   . CKD (chronic kidney disease) stage 3, GFR 30-59 ml/min (HCC) 05/21/2015  . Closed left hip fracture (HCC)   . Coronary artery disease   . Dementia   . Depression with anxiety   . Dysphagia due to recent stroke 05/31/2015  . Fall   . HLD (hyperlipidemia)   . Hx of arterial ischemic stroke 05/21/2015  . Hyperlipidemia   . Hypertension   . Hypokalemia   . Left displaced femoral neck fracture (HCC) 05/25/2016  . Leukocytosis 07/17/2016  . Protein-calorie malnutrition, severe 05/25/2016  . Syncope 05/24/2013  . Transient ischemic attack (TIA)   . Vitamin D deficiency 05/30/2015    Past Surgical History:  Procedure Laterality Date  . ABDOMINAL HYSTERECTOMY    . ANTERIOR APPROACH HEMI HIP ARTHROPLASTY Left 05/25/2016   Procedure: ANTERIOR APPROACH HEMI HIP ARTHROPLASTY;  Surgeon: Samson Frederic, MD;  Location: [facility];  Service:  Orthopedics;  Laterality: Left;    Allergies as of 07/22/2017      Reactions   Penicillins Swelling   Facial and hand swelling per daughter Has patient had a PCN reaction causing immediate rash, facial/tongue/throat swelling, SOB or lightheadedness with hypotension: Yes Has patient had a PCN reaction causing severe rash involving mucus membranes or skin necrosis: No Has patient had a PCN reaction that required hospitalization No Has patient had a PCN reaction occurring within the last 10 years: No If all of the above answers are "NO", then may proceed with Cephalosporin use.   Penicillins Swelling   Has patient had a PCN reaction causing immediate rash, facial/tongue/throat swelling, SOB or lightheadedness with hypotension: Yes Has patient had a PCN reaction causing severe rash involving mucus membranes or skin necrosis: No Has patient had a PCN reaction that required hospitalization No Has patient had a PCN reaction occurring within the last 10 years: No If all of the above answers are "NO", then may proceed with Cephalosporin use.      Medication List        Accurate as of 07/22/17 11:59 PM. Always use your most recent med list.          aspirin 81 MG EC tablet Take 1 tablet (81 mg total) by mouth 2 (two) times daily with a meal.   bisacodyl 5 MG EC tablet Commonly known as:  DULCOLAX Take 1 tablet (5 mg total) by mouth daily as needed for moderate constipation.   busPIRone 5 MG tablet Commonly known as:  BUSPAR Take 5 mg by mouth. Take one tablet twice daily   carvedilol 3.125 MG tablet Commonly known as:  COREG Take 1 tablet (3.125 mg total) by mouth 2 (two) times daily with a meal.   divalproex 125 MG DR tablet Commonly known as:  DEPAKOTE Take 125 mg by mouth. Take one tablet daily for mood disorder   feeding supplement Liqd Take 1 Container by mouth 2 (two) times daily between meals.   ferrous sulfate 325 (65 FE) MG tablet Take 325 mg by mouth daily with  breakfast.   HYDROcodone-acetaminophen 5-325 MG tablet Commonly known as:  NORCO/VICODIN Take 1 tablet by mouth every 8 (eight) hours as needed for moderate pain or severe pain.   memantine 10 MG tablet Commonly known as:  NAMENDA Take 10 mg by mouth 2 (two) times daily.   mirtazapine 15 MG tablet Commonly known as:  REMERON Take 15 mg by mouth. Take 1/2 tablet (7.5 mg) at bedtime   multivitamin tablet Take 1 tablet by mouth daily.   saccharomyces boulardii 250 MG capsule Commonly known as:  FLORASTOR Take 250 mg by mouth 2 (two) times daily.       No orders of the defined types were placed in this encounter.   Immunization History  Administered Date(s) Administered  . Influenza,inj,Quad PF,6+ Mos 07/19/2016  . PPD Test 05/28/2016  . Pneumococcal Conjugate-13 07/11/2017    Social History   Tobacco Use  . Smoking status: Never Smoker  . Smokeless tobacco: Never Used  Substance Use Topics  . Alcohol use: No    Review of Systems  DATA OBTAINED: from patient-very limited; nursing-no concerns GENERAL:  no fevers, fatigue, appetite changes SKIN: No itching, rash HEENT: No complaint RESPIRATORY: No cough, wheezing, SOB CARDIAC: No chest pain, palpitations, lower extremity edema  GI: No abdominal pain, No N/V/D or constipation, No heartburn or reflux  GU: No dysuria, frequency or urgency, or incontinence  MUSCULOSKELETAL: No unrelieved bone/joint pain NEUROLOGIC: No headache, dizziness  PSYCHIATRIC: No overt anxiety or sadness  Vitals:   07/22/17 1542  BP: (!) 171/79  Pulse: 87  Resp: 19  Temp: (!) 96.5 F (35.8 C)   Body mass index is 20.37 kg/m. Physical Exam  GENERAL APPEARANCE: Alert, conversant, No acute distress  SKIN: No diaphoresis rash HEENT: Unremarkable RESPIRATORY: Breathing is even, unlabored. Lung sounds are clear   CARDIOVASCULAR: Heart RRR no murmurs, rubs or gallops. No peripheral edema  GASTROINTESTINAL: Abdomen is soft, non-tender,  not distended w/ normal bowel sounds.  GENITOURINARY: Bladder non tender, not distended  MUSCULOSKELETAL: No abnormal joints or musculature NEUROLOGIC: Cranial nerves 2-12 grossly intact. Moves all extremities PSYCHIATRIC: Mood and affect with dementia, no behavioral issues  Patient Active Problem List   Diagnosis Date Noted  . Anxiety 07/25/2017  . 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 FTT (failure to thrive) in adult 08/04/2016  . Depression 07/28/2016  . Leukocytosis 07/17/2016  . Pressure injury of skin 07/17/2016  . Malnutrition of moderate degree 07/17/2016  . C. difficile diarrhea 07/07/2016  . Pedal edema 06/27/2016  . Postoperative anemia due to acute blood loss 06/10/2016  . Newly recognized heart murmur 06/10/2016  . Acute renal failure superimposed on stage 3 chronic kidney disease (HCC) 06/10/2016  . Chronic combined systolic and diastolic CHF (congestive heart failure) (HCC) 05/28/2016  . Protein-calorie malnutrition, severe 05/25/2016  . Left displaced femoral neck fracture (HCC) 05/25/2016  . Fall 05/24/2016  . Hypokalemia 05/24/2016  . AKI (acute kidney injury) (HCC) 05/24/2016  .  Hypertensive heart disease with chronic combined systolic and diastolic congestive heart failure (HCC)   . Dementia   . Depression with anxiety   . HLD (hyperlipidemia)   . Dysphagia due to recent stroke 05/31/2015  . Hyperlipidemia 05/30/2015  . Vitamin D deficiency 05/30/2015  . Acute CVA (cerebrovascular accident) (HCC)   . Hx of arterial ischemic stroke 05/21/2015  . CKD (chronic kidney disease) stage 3, GFR 30-59 ml/min (HCC) 05/21/2015  . Dementia 05/21/2015  . Hypertension 05/21/2015  . UTI (urinary tract infection) 05/25/2013  . Acute renal failure (HCC) 05/25/2013  . Syncope 05/24/2013    CMP     Component Value Date/Time   NA 138 01/03/2017 1526   NA 138 01/03/2017   K 5.4 (H) 01/03/2017 1526   CL 105 01/03/2017 1526   CO2 23 01/03/2017 1526   GLUCOSE 103  (H) 01/03/2017 1526   BUN 40 (H) 01/03/2017 1526   BUN 40 (A) 01/03/2017   CREATININE 1.55 (H) 01/03/2017 1526   CALCIUM 9.2 01/03/2017 1526   PROT 6.9 01/03/2017 1526   ALBUMIN 3.2 (L) 01/03/2017 1526   AST 36 01/03/2017 1526   ALT 9 (L) 01/03/2017 1526   ALKPHOS 72 01/03/2017 1526   BILITOT 1.1 01/03/2017 1526   GFRNONAA 27 (L) 01/03/2017 1526   GFRAA 31 (L) 01/03/2017 1526   Recent Labs    09/18/16 12/30/16 01/03/17 01/03/17 1526  NA 139 146 138 138  K 4.7 5.1  --  5.4*  CL  --   --   --  105  CO2  --   --   --  23  GLUCOSE  --   --   --  103*  BUN 25* 31* 40* 40*  CREATININE 1.3* 1.5* 1.6* 1.55*  CALCIUM  --   --   --  9.2   Recent Labs    09/18/16 12/30/16 01/03/17 1526  AST 15 14 36  ALT 13 9 9*  ALKPHOS 79 71 72  BILITOT  --   --  1.1  PROT  --   --  6.9  ALBUMIN  --   --  3.2*   Recent Labs    09/18/16 12/30/16 01/03/17 01/03/17 1526  WBC 9.8 5.8 7.6 7.6  HGB 11.0* 10.8*  --  10.5*  HCT 36 34*  --  32.8*  MCV  --   --   --  84.8  PLT 217 251  --  227   Recent Labs    12/30/16  CHOL 178  LDLCALC 107  TRIG 86   [facility] results found [facility]: [facility] Lab Results  Component Value Date   TSH 3.40 12/30/2016   Lab Results  Component Value Date   HGBA1C 5.8 12/30/2016   Lab Results  Component Value Date   CHOL 178 12/30/2016   HDL 54 12/30/2016   LDLCALC 107 12/30/2016   TRIG 86 12/30/2016   CHOLHDL 5.4 05/22/2015    Significant Diagnostic Results in last 30 days:  No results found.  Assessment and Plan  Chronic combined systolic and diastolic CHF (congestive heart failure) (HCC) Exacerbations reported; continue Coreg 3.125 mg by mouth twice a day; patient is stable on no diuretics  CKD (chronic kidney disease) stage 3, GFR 30-59 ml/min Most recent BUN 40 creatinine 1.55 which is stable from prior; we'll monitor intervals  Anxiety Controlled; continue BuSpar 5 mg by mouth twice a day     Anne D. Lyn HollingsheadAlexander, MD

## 2017-07-25 ENCOUNTER — Encounter: Payer: Self-pay | Admitting: Internal Medicine

## 2017-07-25 DIAGNOSIS — F419 Anxiety disorder, unspecified: Secondary | ICD-10-CM | POA: Insufficient documentation

## 2017-07-25 NOTE — Assessment & Plan Note (Signed)
Exacerbations reported; continue Coreg 3.125 mg by mouth twice a day; patient is stable on no diuretics

## 2017-07-25 NOTE — Assessment & Plan Note (Deleted)
Uncontrolled today, but has been controlled prior; we'll continue Coreg 3.125 mg by mouth twice a day and obtain daily blood pressures for a week

## 2017-07-25 NOTE — Assessment & Plan Note (Signed)
Most recent BUN 40 creatinine 1.55 which is stable from prior; we'll monitor intervals

## 2017-07-25 NOTE — Assessment & Plan Note (Signed)
Controlled; continue BuSpar 5 mg by mouth twice a day

## 2017-07-29 LAB — BASIC METABOLIC PANEL
BUN: 27 — AB (ref 4–21)
CREATININE: 1.5 — AB (ref 0.5–1.1)
Glucose: 86
Potassium: 4.6 (ref 3.4–5.3)
Sodium: 144 (ref 137–147)

## 2017-07-29 LAB — LIPID PANEL
Cholesterol: 219 — AB (ref 0–200)
HDL: 46 (ref 35–70)
LDL CALC: 154
Triglycerides: 93 (ref 40–160)

## 2017-07-29 LAB — TSH: TSH: 1.15 (ref 0.41–5.90)

## 2017-07-29 LAB — VITAMIN D 25 HYDROXY (VIT D DEFICIENCY, FRACTURES): Vit D, 25-Hydroxy: 42.16

## 2017-07-29 LAB — CBC AND DIFFERENTIAL
HEMATOCRIT: 37 (ref 36–46)
HEMOGLOBIN: 11.8 — AB (ref 12.0–16.0)
Platelets: 187 (ref 150–399)
WBC: 5.6

## 2017-07-29 LAB — HEPATIC FUNCTION PANEL
ALT: 5 — AB (ref 7–35)
AST: 12 — AB (ref 13–35)
Alkaline Phosphatase: 91 (ref 25–125)
BILIRUBIN, TOTAL: 0.2

## 2017-07-29 LAB — HEMOGLOBIN A1C: HEMOGLOBIN A1C: 5.9

## 2017-08-01 ENCOUNTER — Other Ambulatory Visit: Payer: Self-pay

## 2017-08-18 ENCOUNTER — Non-Acute Institutional Stay (SKILLED_NURSING_FACILITY): Payer: Medicare Other | Admitting: Internal Medicine

## 2017-08-18 ENCOUNTER — Encounter: Payer: Self-pay | Admitting: Internal Medicine

## 2017-08-18 DIAGNOSIS — G301 Alzheimer's disease with late onset: Secondary | ICD-10-CM | POA: Diagnosis not present

## 2017-08-18 DIAGNOSIS — I5042 Chronic combined systolic (congestive) and diastolic (congestive) heart failure: Secondary | ICD-10-CM | POA: Diagnosis not present

## 2017-08-18 DIAGNOSIS — F39 Unspecified mood [affective] disorder: Secondary | ICD-10-CM

## 2017-08-18 DIAGNOSIS — F0280 Dementia in other diseases classified elsewhere without behavioral disturbance: Secondary | ICD-10-CM

## 2017-08-18 DIAGNOSIS — I110 Hypertensive heart disease with heart failure: Secondary | ICD-10-CM

## 2017-08-18 NOTE — Progress Notes (Signed)
[facility] Room Number: [phone] Place of Service:  SNF [phone])  Ariel Douglas, Ariel Boody D, MD  Patient Care Team: Ariel Douglas, Ariel Trostel D, MD as PCP - General (Internal Medicine) Ariel Douglas, James, MD (Internal Medicine)  Extended Emergency Contact Information Primary Emergency Contact: Ariel Douglas,Ariel Douglas Address: [address]          [place], [place] [id number] [place] Home Phone: [phone] Mobile Phone: [phone] Relation: Daughter Secondary Emergency Contact: Ariel Douglas,Ariel Douglas  [place] Home Phone: [phone] Relation: Grandaughter    Allergies: Penicillins and Penicillins  Chief Complaint  Patient presents with  . Medical Management of Chronic Issues    routine visit    HPI: Patient is 83 y.o. female who is being seen for routine issues of mood disorder, dementia, and hypertension.  Past Medical History:  Diagnosis Date  . Acute CVA (cerebrovascular accident) (HCC)   . Acute renal failure (HCC) 05/25/2013  . AKI (acute kidney injury) (HCC)   . CKD (chronic kidney disease) stage 3, GFR 30-59 ml/min (HCC) 05/21/2015  . Closed left hip fracture (HCC)   . Coronary artery disease   . Dementia   . Depression with anxiety   . Dysphagia due to recent stroke 05/31/2015  . Fall   . HLD (hyperlipidemia)   . Hx of arterial ischemic stroke 05/21/2015  . Hyperlipidemia   . Hypertension   . Hypokalemia   . Left displaced femoral neck fracture (HCC) 05/25/2016  . Leukocytosis 07/17/2016  . Protein-calorie malnutrition, severe 05/25/2016  . Syncope 05/24/2013  . Transient ischemic attack (TIA)   . Vitamin Douglas deficiency 05/30/2015    Past Surgical History:  Procedure Laterality Date  . ABDOMINAL HYSTERECTOMY    . ANTERIOR APPROACH HEMI HIP ARTHROPLASTY Left 05/25/2016   Procedure: ANTERIOR APPROACH HEMI HIP ARTHROPLASTY;  Surgeon: Samson FredericBrian Swinteck, MD;  Location: [facility];  Service: Orthopedics;  Laterality: Left;     Allergies as of 08/18/2017      Reactions   Penicillins Swelling   Facial and hand swelling per daughter Has patient had a PCN reaction causing immediate rash, facial/tongue/throat swelling, SOB or lightheadedness with hypotension: Yes Has patient had a PCN reaction causing severe rash involving mucus membranes or skin necrosis: No Has patient had a PCN reaction that required hospitalization No Has patient had a PCN reaction occurring within the last 10 years: No If all of the above answers are "NO", then may proceed with Cephalosporin use.   Penicillins Swelling   Has patient had a PCN reaction causing immediate rash, facial/tongue/throat swelling, SOB or lightheadedness with hypotension: Yes Has patient had a PCN reaction causing severe rash involving mucus membranes or skin necrosis: No Has patient had a PCN reaction that required hospitalization No Has patient had a PCN reaction occurring within the last 10 years: No If all of the above answers are "NO", then may proceed with Cephalosporin use.      Medication List        Accurate as of 08/18/17 11:59 PM. Always use your most recent med list.          aspirin 81 MG EC tablet Take 1 tablet (81 mg total) by mouth 2 (two) times daily with a meal.   bisacodyl 5 MG EC tablet Commonly known as:  DULCOLAX Take 1 tablet (5 mg total) by mouth daily as needed for moderate constipation.   busPIRone 5 MG tablet Commonly known as:  BUSPAR Take 5 mg by  mouth. Take one tablet twice daily   carvedilol 3.125 MG tablet Commonly known as:  COREG Take 1 tablet (3.125 mg total) by mouth 2 (two) times daily with a meal.   divalproex 125 MG DR tablet Commonly known as:  DEPAKOTE Take 125 mg by mouth. Take one tablet twice daily for mood disorder   feeding supplement Liqd Take 1 Container by mouth 2 (two) times daily between meals.   ferrous sulfate 325 (65 FE) MG tablet Take 325 mg by mouth daily with breakfast.    HYDROcodone-acetaminophen 5-325 MG tablet Commonly known as:  NORCO/VICODIN Take 1 tablet by mouth every 8 (eight) hours as needed for moderate pain or severe pain.   memantine 10 MG tablet Commonly known as:  NAMENDA Take 10 mg by mouth 2 (two) times daily.   mirtazapine 15 MG tablet Commonly known as:  REMERON Take 15 mg by mouth. Take 1/2 tablet (7.5 mg) at bedtime   multivitamin tablet Take 1 tablet by mouth daily.   saccharomyces boulardii 250 MG capsule Commonly known as:  FLORASTOR Take 250 mg by mouth 2 (two) times daily.       No orders of the defined types were placed in this encounter.   Immunization History  Administered Date(s) Administered  . Influenza,inj,Quad PF,6+ Mos 07/19/2016  . Influenza-Unspecified 06/27/2017  . PPD Test 05/28/2016  . Pneumococcal Conjugate-13 07/11/2017    Social History   Tobacco Use  . Smoking status: Never Smoker  . Smokeless tobacco: Never Used  Substance Use Topics  . Alcohol use: No    Review of Systems  DATA OBTAINED: from patient-limited; nursing GENERAL:  no fevers, fatigue, appetite changes SKIN: No itching, rash HEENT: No complaint RESPIRATORY: No cough, wheezing, SOB CARDIAC: No chest pain, palpitations, lower extremity edema  GI: No abdominal pain, No N/V/Douglas or constipation, No heartburn or reflux  GU: No dysuria, frequency or urgency, or incontinence  MUSCULOSKELETAL: No unrelieved bone/joint pain NEUROLOGIC: No headache, dizziness  PSYCHIATRIC: No overt anxiety or sadness  Vitals:   08/18/17 1351  BP: 127/69  Pulse: 89  Resp: 18  Temp: (!) 97.3 F (36.3 C)  SpO2: 97%   Body mass index is 20.19 kg/m. Physical Exam  GENERAL APPEARANCE: Alert, conversant, No acute distress  SKIN: No diaphoresis rash HEENT: Unremarkable RESPIRATORY: Breathing is even, unlabored. Lung sounds are clear   CARDIOVASCULAR: Heart RRR no murmurs, rubs or gallops. No peripheral edema  GASTROINTESTINAL: Abdomen is  soft, non-tender, not distended w/ normal bowel sounds.  GENITOURINARY: Bladder non tender, not distended  MUSCULOSKELETAL: No abnormal joints or musculature NEUROLOGIC: Cranial nerves 2-12 grossly intact. Moves all extremities PSYCHIATRIC: Mood and affect with dementia, no behavioral issues  Patient Active Problem List   Diagnosis Date Noted  . Anxiety 07/25/2017  . 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 FTT (failure to thrive) in adult 08/04/2016  . Mood disorder (HCC) 07/28/2016  . Leukocytosis 07/17/2016  . Pressure injury of skin 07/17/2016  . Malnutrition of moderate degree 07/17/2016  . C. difficile diarrhea 07/07/2016  . Pedal edema 06/27/2016  . Postoperative anemia due to acute blood loss 06/10/2016  . Newly recognized heart murmur 06/10/2016  . Acute renal failure superimposed on stage 3 chronic kidney disease (HCC) 06/10/2016  . Chronic combined systolic and diastolic CHF (congestive heart failure) (HCC) 05/28/2016  . Protein-calorie malnutrition, severe 05/25/2016  . Left displaced femoral neck fracture (HCC) 05/25/2016  . Fall 05/24/2016  . Hypokalemia 05/24/2016  . AKI (acute kidney injury) (  HCC) 05/24/2016  . Hypertensive heart disease with chronic combined systolic and diastolic congestive heart failure (HCC)   . Dementia   . Depression with anxiety   . HLD (hyperlipidemia)   . Dysphagia due to recent stroke 05/31/2015  . Hyperlipidemia 05/30/2015  . Vitamin Douglas deficiency 05/30/2015  . Acute CVA (cerebrovascular accident) (HCC)   . Hx of arterial ischemic stroke 05/21/2015  . CKD (chronic kidney disease) stage 3, GFR 30-59 ml/min (HCC) 05/21/2015  . Dementia 05/21/2015  . Hypertension 05/21/2015  . UTI (urinary tract infection) 05/25/2013  . Acute renal failure (HCC) 05/25/2013  . Syncope 05/24/2013    CMP     Component Value Date/Time   NA 144 07/29/2017   K 4.6 07/29/2017   CL 105 01/03/2017 1526   CO2 23 01/03/2017 1526   GLUCOSE 103 (H)  01/03/2017 1526   BUN 27 (A) 07/29/2017   CREATININE 1.5 (A) 07/29/2017   CREATININE 1.55 (H) 01/03/2017 1526   CALCIUM 9.2 01/03/2017 1526   PROT 6.9 01/03/2017 1526   ALBUMIN 3.2 (L) 01/03/2017 1526   AST 12 (A) 07/29/2017   ALT 5 (A) 07/29/2017   ALKPHOS 91 07/29/2017   BILITOT 1.1 01/03/2017 1526   GFRNONAA 27 (L) 01/03/2017 1526   GFRAA 31 (L) 01/03/2017 1526   Recent Labs    12/30/16 01/03/17 01/03/17 1526 07/29/17  NA 146 138 138 144  K 5.1  --  5.4* 4.6  CL  --   --  105  --   CO2  --   --  23  --   GLUCOSE  --   --  103*  --   BUN 31* 40* 40* 27*  CREATININE 1.5* 1.6* 1.55* 1.5*  CALCIUM  --   --  9.2  --    Recent Labs    12/30/16 01/03/17 1526 07/29/17  AST 14 36 12*  ALT 9 9* 5*  ALKPHOS 71 72 91  BILITOT  --  1.1  --   PROT  --  6.9  --   ALBUMIN  --  3.2*  --    Recent Labs    12/30/16 01/03/17 01/03/17 1526 07/29/17  WBC 5.8 7.6 7.6 5.6  HGB 10.8*  --  10.5* 11.8*  HCT 34*  --  32.8* 37  MCV  --   --  84.8  --   PLT 251  --  227 187   Recent Labs    12/30/16 07/29/17  CHOL 178 219*  LDLCALC 107 154  TRIG 86 93   No results found for: MICROALBUR Lab Results  Component Value Date   TSH 1.15 07/29/2017   Lab Results  Component Value Date   HGBA1C 5.9 07/29/2017   Lab Results  Component Value Date   CHOL 219 (A) 07/29/2017   HDL 46 07/29/2017   LDLCALC 154 07/29/2017   TRIG 93 07/29/2017   CHOLHDL 5.4 05/22/2015    Significant Diagnostic Results in last 30 days:  No results found.  Assessment and Plan  Mood disorder (HCC) Chronic and stable; continue Depakote 125 mg by mouth twice a day  Dementia Stable without major declines; continue Namenda 10 mg by mouth twice a day  Hypertensive heart disease with chronic combined systolic and diastolic congestive heart failure (HCC) Stable; continue Coreg 3.125 mg by mouth twice a day    Isrrael Fluckiger Douglas. [name], MD

## 2017-09-20 ENCOUNTER — Encounter: Payer: Self-pay | Admitting: Internal Medicine

## 2017-09-20 NOTE — Assessment & Plan Note (Signed)
Stable without major declines; continue Namenda 10 mg by mouth twice a day

## 2017-09-20 NOTE — Assessment & Plan Note (Signed)
Stable; continue Coreg 3.125 mg by mouth twice a day

## 2017-09-20 NOTE — Assessment & Plan Note (Signed)
Chronic and stable; continue Depakote 125 mg by mouth twice a day

## 2017-09-22 ENCOUNTER — Encounter: Payer: Self-pay | Admitting: Internal Medicine

## 2017-09-22 ENCOUNTER — Non-Acute Institutional Stay (SKILLED_NURSING_FACILITY): Payer: Medicare Other | Admitting: Internal Medicine

## 2017-09-22 DIAGNOSIS — Z8673 Personal history of transient ischemic attack (TIA), and cerebral infarction without residual deficits: Secondary | ICD-10-CM

## 2017-09-22 DIAGNOSIS — F419 Anxiety disorder, unspecified: Secondary | ICD-10-CM

## 2017-09-22 DIAGNOSIS — E785 Hyperlipidemia, unspecified: Secondary | ICD-10-CM

## 2017-09-22 NOTE — Progress Notes (Signed)
Location[facility] Room Number: [phone] Place of Service:  SNF [phone])  Margit Hanks, MD  Patient Care Team: Margit Hanks, MD as PCP - General (Internal Medicine) [name], MD (Internal Medicine)  Extended Emergency Contact Information Primary Emergency Contact: [facility] Address: [address]          [place], [place] [address] [place] Home Phone: [phone] Mobile Phone: [phone] Relation: Daughter Secondary Emergency Contact: Asa [place] Home Phone: [phone] Relation: Grandaughter    Allergies: Penicillins and Penicillins  Chief Complaint  Patient presents with  . Medical Management of Chronic Issues    Routine Visit    HPI: Patient is 84 y.o. female who is being seen for routine issues of history of CVA, hyperlipidemia, and anxiety.  Past Medical History:  Diagnosis Date  . Acute CVA (cerebrovascular accident) (HCC)   . Acute renal failure (HCC) 05/25/2013  . AKI (acute kidney injury) (HCC)   . CKD (chronic kidney disease) stage 3, GFR 30-59 ml/min (HCC) 05/21/2015  . Closed left hip fracture (HCC)   . Coronary artery disease   . Dementia   . Depression with anxiety   . Dysphagia due to recent stroke 05/31/2015  . Fall   . HLD (hyperlipidemia)   . Hx of arterial ischemic stroke 05/21/2015  . Hyperlipidemia   . Hypertension   . Hypokalemia   . Left displaced femoral neck fracture (HCC) 05/25/2016  . Leukocytosis 07/17/2016  . Protein-calorie malnutrition, severe 05/25/2016  . Syncope 05/24/2013  . Transient ischemic attack (TIA)   . Vitamin D deficiency 05/30/2015    Past Surgical History:  Procedure Laterality Date  . ABDOMINAL HYSTERECTOMY    . ANTERIOR APPROACH HEMI HIP ARTHROPLASTY Left 05/25/2016   Procedure: ANTERIOR APPROACH HEMI HIP ARTHROPLASTY;  Surgeon: Samson Frederic, MD;  Location: [facility];  Service: Orthopedics;  Laterality: Left;     Allergies as of 09/22/2017      Reactions   Penicillins Swelling   Facial and hand swelling per daughter Has patient had a PCN reaction causing immediate rash, facial/tongue/throat swelling, SOB or lightheadedness with hypotension: Yes Has patient had a PCN reaction causing severe rash involving mucus membranes or skin necrosis: No Has patient had a PCN reaction that required hospitalization No Has patient had a PCN reaction occurring within the last 10 years: No If all of the above answers are "NO", then may proceed with Cephalosporin use.   Penicillins Swelling   Has patient had a PCN reaction causing immediate rash, facial/tongue/throat swelling, SOB or lightheadedness with hypotension: Yes Has patient had a PCN reaction causing severe rash involving mucus membranes or skin necrosis: No Has patient had a PCN reaction that required hospitalization No Has patient had a PCN reaction occurring within the last 10 years: No If all of the above answers are "NO", then may proceed with Cephalosporin use.      Medication List        Accurate as of 09/22/17 11:59 PM. Always use your most recent med list.          aspirin 81 MG EC tablet Take 1 tablet (81 mg total) by mouth 2 (two) times daily with a meal.   bisacodyl 5 MG EC tablet Commonly known as:  DULCOLAX Take 1 tablet (5 mg total) by mouth daily as needed for moderate constipation.   busPIRone 5 MG tablet Commonly known as:  BUSPAR Take 5 mg  by mouth 2 (two) times daily.   carvedilol 3.125 MG tablet Commonly known as:  COREG Take 1 tablet (3.125 mg total) by mouth 2 (two) times daily with a meal.   divalproex 125 MG DR tablet Commonly known as:  DEPAKOTE Take 125 mg by mouth 2 (two) times daily.   feeding supplement Liqd Take 1 Container by mouth 2 (two) times daily between meals.   ferrous sulfate 325 (65 FE) MG tablet Take 325 mg by mouth daily with breakfast.   HYDROcodone-acetaminophen 5-325 MG tablet Commonly  known as:  NORCO/VICODIN Take 1 tablet by mouth every 8 (eight) hours as needed for moderate pain or severe pain.   memantine 10 MG tablet Commonly known as:  NAMENDA Take 10 mg by mouth 2 (two) times daily.   mirtazapine 15 MG tablet Commonly known as:  REMERON Take 7.5 mg by mouth at bedtime. 1/2 tab   multivitamin tablet Take 1 tablet by mouth daily.   saccharomyces boulardii 250 MG capsule Commonly known as:  FLORASTOR Take 250 mg by mouth 2 (two) times daily.       No orders of the defined types were placed in this encounter.   Immunization History  Administered Date(s) Administered  . Influenza,inj,Quad PF,6+ Mos 07/19/2016  . Influenza-Unspecified 06/27/2017  . PPD Test 05/28/2016  . Pneumococcal Conjugate-13 07/11/2017    Social History   Tobacco Use  . Smoking status: Never Smoker  . Smokeless tobacco: Never Used  Substance Use Topics  . Alcohol use: No    Review of Systems  DATA OBTAINED: from patient-very limited; nursing-no concerns GENERAL:  no fevers, fatigue, appetite changes SKIN: No itching, rash HEENT: No complaint RESPIRATORY: No cough, wheezing, SOB CARDIAC: No chest pain, palpitations, lower extremity edema  GI: No abdominal pain, No N/V/D or constipation, No heartburn or reflux  GU: No dysuria, frequency or urgency, or incontinence  MUSCULOSKELETAL: No unrelieved bone/joint pain NEUROLOGIC: No headache, dizziness  PSYCHIATRIC: No overt anxiety or sadness  Vitals:   09/22/17 1407  BP: (!) 145/74  Pulse: 68  Resp: 18  Temp: 98.1 F (36.7 C)  SpO2: 98%   Body mass index is 20.9 kg/m. Physical Exam  GENERAL APPEARANCE: Alert, conversant, No acute distress  SKIN: No diaphoresis rash HEENT: Unremarkable RESPIRATORY: Breathing is even, unlabored. Lung sounds are clear   CARDIOVASCULAR: Heart RRR no murmurs, rubs or gallops. No peripheral edema  GASTROINTESTINAL: Abdomen is soft, non-tender, not distended w/ normal bowel sounds.    GENITOURINARY: Bladder non tender, not distended  MUSCULOSKELETAL: No abnormal joints or musculature NEUROLOGIC: Cranial nerves 2-12 grossly intact. Moves all extremities PSYCHIATRIC: Mood and affect with dementia, no behavioral issues  Patient Active Problem List   Diagnosis Date Noted  . Anxiety 07/25/2017  . 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 FTT (failure to thrive) in adult 08/04/2016  . Mood disorder (HCC) 07/28/2016  . Leukocytosis 07/17/2016  . Pressure injury of skin 07/17/2016  . Malnutrition of moderate degree 07/17/2016  . C. difficile diarrhea 07/07/2016  . Pedal edema 06/27/2016  . Postoperative anemia due to acute blood loss 06/10/2016  . Newly recognized heart murmur 06/10/2016  . Acute renal failure superimposed on stage 3 chronic kidney disease (HCC) 06/10/2016  . Chronic combined systolic and diastolic CHF (congestive heart failure) (HCC) 05/28/2016  . Protein-calorie malnutrition, severe 05/25/2016  . Left displaced femoral neck fracture (HCC) 05/25/2016  . Fall 05/24/2016  . Hypokalemia 05/24/2016  . AKI (acute kidney injury) (HCC) 05/24/2016  .  Hypertensive heart disease with chronic combined systolic and diastolic congestive heart failure (HCC)   . Dementia   . Depression with anxiety   . HLD (hyperlipidemia)   . Dysphagia due to recent stroke 05/31/2015  . Hyperlipidemia 05/30/2015  . Vitamin D deficiency 05/30/2015  . Acute CVA (cerebrovascular accident) (HCC)   . Hx of arterial ischemic stroke 05/21/2015  . CKD (chronic kidney disease) stage 3, GFR 30-59 ml/min (HCC) 05/21/2015  . Dementia 05/21/2015  . Hypertension 05/21/2015  . UTI (urinary tract infection) 05/25/2013  . Acute renal failure (HCC) 05/25/2013  . Syncope 05/24/2013    CMP     Component Value Date/Time   NA 144 07/29/2017   K 4.6 07/29/2017   CL 105 01/03/2017 1526   CO2 23 01/03/2017 1526   GLUCOSE 103 (H) 01/03/2017 1526   BUN 27 (A) 07/29/2017   CREATININE 1.5  (A) 07/29/2017   CREATININE 1.55 (H) 01/03/2017 1526   CALCIUM 9.2 01/03/2017 1526   PROT 6.9 01/03/2017 1526   ALBUMIN 3.2 (L) 01/03/2017 1526   AST 12 (A) 07/29/2017   ALT 5 (A) 07/29/2017   ALKPHOS 91 07/29/2017   BILITOT 1.1 01/03/2017 1526   GFRNONAA 27 (L) 01/03/2017 1526   GFRAA 31 (L) 01/03/2017 1526   Recent Labs    12/30/16 01/03/17 01/03/17 1526 07/29/17  NA 146 138 138 144  K 5.1  --  5.4* 4.6  CL  --   --  105  --   CO2  --   --  23  --   GLUCOSE  --   --  103*  --   BUN 31* 40* 40* 27*  CREATININE 1.5* 1.6* 1.55* 1.5*  CALCIUM  --   --  9.2  --    Recent Labs    12/30/16 01/03/17 1526 07/29/17  AST 14 36 12*  ALT 9 9* 5*  ALKPHOS 71 72 91  BILITOT  --  1.1  --   PROT  --  6.9  --   ALBUMIN  --  3.2*  --    Recent Labs    12/30/16 01/03/17 01/03/17 1526 07/29/17  WBC 5.8 7.6 7.6 5.6  HGB 10.8*  --  10.5* 11.8*  HCT 34*  --  32.8* 37  MCV  --   --  84.8  --   PLT 251  --  227 187   Recent Labs    12/30/16 07/29/17  CHOL 178 219*  LDLCALC 107 154  TRIG 86 93   No results found for: MICROALBUR Lab Results  Component Value Date   TSH 1.15 07/29/2017   Lab Results  Component Value Date   HGBA1C 5.9 07/29/2017   Lab Results  Component Value Date   CHOL 219 (A) 07/29/2017   HDL 46 07/29/2017   LDLCALC 154 07/29/2017   TRIG 93 07/29/2017   CHOLHDL 5.4 05/22/2015    Significant Diagnostic Results in last 30 days:  No results found.  Assessment and Plan  Hx of arterial ischemic stroke Stable; continue ASA 81 mg twice a day  Hyperlipidemia LDL 154, HDL 46 on no medications, which is acceptable for patient's age  Anxiety Controlled; continue BuSpar 5 mg twice a day     Thurston Hole D. [name], MD

## 2017-09-28 ENCOUNTER — Encounter: Payer: Self-pay | Admitting: Internal Medicine

## 2017-09-28 NOTE — Assessment & Plan Note (Signed)
LDL 154, HDL 46 on no medications, which is acceptable for patient's age

## 2017-09-28 NOTE — Assessment & Plan Note (Signed)
Controlled; continue BuSpar 5 mg twice a day

## 2017-09-28 NOTE — Assessment & Plan Note (Signed)
Stable; continue ASA 81 mg twice a day

## 2017-11-14 ENCOUNTER — Non-Acute Institutional Stay (SKILLED_NURSING_FACILITY): Payer: Medicare Other | Admitting: Internal Medicine

## 2017-11-14 DIAGNOSIS — N183 Chronic kidney disease, stage 3 unspecified: Secondary | ICD-10-CM

## 2017-11-14 DIAGNOSIS — I5042 Chronic combined systolic (congestive) and diastolic (congestive) heart failure: Secondary | ICD-10-CM

## 2017-11-14 DIAGNOSIS — I110 Hypertensive heart disease with heart failure: Secondary | ICD-10-CM | POA: Diagnosis not present

## 2017-11-18 ENCOUNTER — Encounter: Payer: Self-pay | Admitting: Internal Medicine

## 2017-11-18 NOTE — Progress Notes (Signed)
Location:  [facility] Room Number: [phone] Place of Service:  SNF [phone])  Margit Hanks, MD  Patient Care Team: Margit Hanks, MD as PCP - General (Internal Medicine) [name], MD (Internal Medicine)  Extended Emergency Contact Information Primary Emergency Contact: [facility] Address: [address]          [place], [place] [address] [place] Home Phone: [phone] Mobile Phone: [phone] Relation: Daughter Secondary Emergency Contact: Asa Saunas [place] Home Phone: [phone] Relation: Grandaughter    Allergies: Penicillins and Penicillins  Chief Complaint  Patient presents with  . Medical Management of Chronic Issues    Routine Visit    HPI: Patient is 84 y.o. female who being seen for routine issues of chronic kidney disease stage III, chronic diastolic and systolic congestive heart failure, and hypertension.  Past Medical History:  Diagnosis Date  . Acute CVA (cerebrovascular accident) (HCC)   . Acute renal failure (HCC) 05/25/2013  . AKI (acute kidney injury) (HCC)   . CKD (chronic kidney disease) stage 3, GFR 30-59 ml/min (HCC) 05/21/2015  . Closed left hip fracture (HCC)   . Coronary artery disease   . Dementia   . Depression with anxiety   . Dysphagia due to recent stroke 05/31/2015  . Fall   . HLD (hyperlipidemia)   . Hx of arterial ischemic stroke 05/21/2015  . Hyperlipidemia   . Hypertension   . Hypokalemia   . Left displaced femoral neck fracture (HCC) 05/25/2016  . Leukocytosis 07/17/2016  . Protein-calorie malnutrition, severe 05/25/2016  . Syncope 05/24/2013  . Transient ischemic attack (TIA)   . Vitamin D deficiency 05/30/2015    Past Surgical History:  Procedure Laterality Date  . ABDOMINAL HYSTERECTOMY    . ANTERIOR APPROACH HEMI HIP ARTHROPLASTY Left 05/25/2016   Procedure: ANTERIOR APPROACH HEMI HIP ARTHROPLASTY;  Surgeon: Samson Frederic, MD;   Location: [facility];  Service: Orthopedics;  Laterality: Left;    Allergies as of 11/14/2017      Reactions   Penicillins Swelling   Facial and hand swelling per daughter Has patient had a PCN reaction causing immediate rash, facial/tongue/throat swelling, SOB or lightheadedness with hypotension: Yes Has patient had a PCN reaction causing severe rash involving mucus membranes or skin necrosis: No Has patient had a PCN reaction that required hospitalization No Has patient had a PCN reaction occurring within the last 10 years: No If all of the above answers are "NO", then may proceed with Cephalosporin use.   Penicillins Swelling   Has patient had a PCN reaction causing immediate rash, facial/tongue/throat swelling, SOB or lightheadedness with hypotension: Yes Has patient had a PCN reaction causing severe rash involving mucus membranes or skin necrosis: No Has patient had a PCN reaction that required hospitalization No Has patient had a PCN reaction occurring within the last 10 years: No If all of the above answers are "NO", then may proceed with Cephalosporin use.      Medication List        Accurate as of 11/14/17 11:59 PM. Always use your most recent med list.          aspirin 81 MG EC tablet Take 1 tablet (81 mg total) by mouth 2 (two) times daily with a meal.   bisacodyl 5 MG EC tablet Commonly known as:  DULCOLAX Take 1 tablet (5 mg total) by mouth daily as needed for moderate constipation.   busPIRone 5 MG tablet Commonly  known as:  BUSPAR Take 5 mg by mouth 2 (two) times daily.   carvedilol 3.125 MG tablet Commonly known as:  COREG Take 1 tablet (3.125 mg total) by mouth 2 (two) times daily with a meal.   divalproex 125 MG DR tablet Commonly known as:  DEPAKOTE Take 125 mg by mouth 2 (two) times daily.   feeding supplement Liqd Take 1 Container by mouth daily.   ferrous sulfate 325 (65 FE) MG tablet Take 325 mg by mouth daily with breakfast.   memantine 10 MG  tablet Commonly known as:  NAMENDA Take 10 mg by mouth 2 (two) times daily.   mirtazapine 15 MG tablet Commonly known as:  REMERON Take 7.5 mg by mouth at bedtime. 1/2 tab   multivitamin tablet Take 1 tablet by mouth daily.   saccharomyces boulardii 250 MG capsule Commonly known as:  FLORASTOR Take 250 mg by mouth 2 (two) times daily.   traMADol 50 MG tablet Commonly known as:  ULTRAM Take 50 mg by mouth at bedtime.       No orders of the defined types were placed in this encounter.   Immunization History  Administered Date(s) Administered  . Influenza,inj,Quad PF,6+ Mos 07/19/2016  . Influenza-Unspecified 06/27/2017  . PPD Test 05/28/2016  . Pneumococcal Conjugate-13 07/11/2017    Social History   Tobacco Use  . Smoking status: Never Smoker  . Smokeless tobacco: Never Used  Substance Use Topics  . Alcohol use: No    Review of Systems  DATA OBTAINED: from patient very limited: Nursing-no acute concerns GENERAL:  no fevers, fatigue, appetite changes SKIN: No itching, rash HEENT: No complaint RESPIRATORY: No cough, wheezing, SOB CARDIAC: No chest pain, palpitations, lower extremity edema  GI: No abdominal pain, No N/V/D or constipation, No heartburn or reflux  GU: No dysuria, frequency or urgency, or incontinence  MUSCULOSKELETAL: No unrelieved bone/joint pain NEUROLOGIC: No headache, dizziness  PSYCHIATRIC: No overt anxiety or sadness  Vitals:   11/14/17 1359  BP: 128/63  Pulse: 76  Resp: 16  Temp: 98.3 F (36.8 C)  SpO2: 96%   Body mass index is 22.75 kg/m. Physical Exam  GENERAL APPEARANCE: Alert, conversant, No acute distress  SKIN: No diaphoresis rash HEENT: Unremarkable RESPIRATORY: Breathing is even, unlabored. Lung sounds are clear   CARDIOVASCULAR: Heart RRR no murmurs, rubs or gallops. No peripheral edema  GASTROINTESTINAL: Abdomen is soft, non-tender, not distended w/ normal bowel sounds.  GENITOURINARY: Bladder non tender, not  distended  MUSCULOSKELETAL: No abnormal joints or musculature NEUROLOGIC: Cranial nerves 2-12 grossly intact. Moves all extremities PSYCHIATRIC: Mood and affect appropriate to situation with dementia, no behavioral issues  Patient Active Problem List   Diagnosis Date Noted  . Anxiety 07/25/2017  . 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 FTT (failure to thrive) in adult 08/04/2016  . Mood disorder (HCC) 07/28/2016  . Leukocytosis 07/17/2016  . Pressure injury of skin 07/17/2016  . Malnutrition of moderate degree 07/17/2016  . C. difficile diarrhea 07/07/2016  . Pedal edema 06/27/2016  . Postoperative anemia due to acute blood loss 06/10/2016  . Newly recognized heart murmur 06/10/2016  . Acute renal failure superimposed on stage 3 chronic kidney disease (HCC) 06/10/2016  . Chronic combined systolic and diastolic CHF (congestive heart failure) (HCC) 05/28/2016  . Protein-calorie malnutrition, severe 05/25/2016  . Left displaced femoral neck fracture (HCC) 05/25/2016  . Fall 05/24/2016  . Hypokalemia 05/24/2016  . AKI (acute kidney injury) (HCC) 05/24/2016  . Hypertensive heart disease with chronic  combined systolic and diastolic congestive heart failure (HCC)   . Dementia   . Depression with anxiety   . HLD (hyperlipidemia)   . Dysphagia due to recent stroke 05/31/2015  . Hyperlipidemia 05/30/2015  . Vitamin D deficiency 05/30/2015  . Acute CVA (cerebrovascular accident) (HCC)   . Hx of arterial ischemic stroke 05/21/2015  . CKD (chronic kidney disease) stage 3, GFR 30-59 ml/min (HCC) 05/21/2015  . Dementia 05/21/2015  . Hypertension 05/21/2015  . UTI (urinary tract infection) 05/25/2013  . Acute renal failure (HCC) 05/25/2013  . Syncope 05/24/2013    CMP     Component Value Date/Time   NA 144 07/29/2017   K 4.6 07/29/2017   CL 105 01/03/2017 1526   CO2 23 01/03/2017 1526   GLUCOSE 103 (H) 01/03/2017 1526   BUN 27 (A) 07/29/2017   CREATININE 1.5 (A) 07/29/2017    CREATININE 1.55 (H) 01/03/2017 1526   CALCIUM 9.2 01/03/2017 1526   PROT 6.9 01/03/2017 1526   ALBUMIN 3.2 (L) 01/03/2017 1526   AST 12 (A) 07/29/2017   ALT 5 (A) 07/29/2017   ALKPHOS 91 07/29/2017   BILITOT 1.1 01/03/2017 1526   GFRNONAA 27 (L) 01/03/2017 1526   GFRAA 31 (L) 01/03/2017 1526   Recent Labs    12/30/16 01/03/17 01/03/17 1526 07/29/17  NA 146 138 138 144  K 5.1  --  5.4* 4.6  CL  --   --  105  --   CO2  --   --  23  --   GLUCOSE  --   --  103*  --   BUN 31* 40* 40* 27*  CREATININE 1.5* 1.6* 1.55* 1.5*  CALCIUM  --   --  9.2  --    Recent Labs    12/30/16 01/03/17 1526 07/29/17  AST 14 36 12*  ALT 9 9* 5*  ALKPHOS 71 72 91  BILITOT  --  1.1  --   PROT  --  6.9  --   ALBUMIN  --  3.2*  --    Recent Labs    12/30/16 01/03/17 01/03/17 1526 07/29/17  WBC 5.8 7.6 7.6 5.6  HGB 10.8*  --  10.5* 11.8*  HCT 34*  --  32.8* 37  MCV  --   --  84.8  --   PLT 251  --  227 187   Recent Labs    12/30/16 07/29/17  CHOL 178 219*  LDLCALC 107 154  TRIG 86 93   No results found for: MICROALBUR Lab Results  Component Value Date   TSH 1.15 07/29/2017   Lab Results  Component Value Date   HGBA1C 5.9 07/29/2017   Lab Results  Component Value Date   CHOL 219 (A) 07/29/2017   HDL 46 07/29/2017   LDLCALC 154 07/29/2017   TRIG 93 07/29/2017   CHOLHDL 5.4 05/22/2015    Significant Diagnostic Results in last 30 days:  No results found.  Assessment and Plan  CKD (chronic kidney disease) stage 3, GFR 30-59 ml/min recent BUN 27/creatinine 1.5; stable from prior; monitor at intervals  Chronic combined systolic and diastolic CHF (congestive heart failure) (HCC) No exacerbations reported; continue Coreg 3.125 mg twice daily  Hypertensive heart disease with chronic combined systolic and diastolic congestive heart failure (HCC) Controlled; continue Coreg 3.125 mg twice daily    Dantae Meunier D. Lyn HollingsheadAlexander, MD

## 2017-11-22 ENCOUNTER — Encounter: Payer: Self-pay | Admitting: Internal Medicine

## 2017-11-22 NOTE — Assessment & Plan Note (Signed)
recent BUN 27/creatinine 1.5; stable from prior; monitor at intervals

## 2017-11-22 NOTE — Assessment & Plan Note (Signed)
No exacerbations reported; continue Coreg 3.125 mg twice daily

## 2017-11-22 NOTE — Assessment & Plan Note (Signed)
Controlled; continue Coreg 3.125 mg twice daily 

## 2017-12-30 ENCOUNTER — Encounter: Payer: Self-pay | Admitting: Internal Medicine

## 2017-12-30 ENCOUNTER — Non-Acute Institutional Stay (SKILLED_NURSING_FACILITY): Payer: Medicare Other | Admitting: Internal Medicine

## 2017-12-30 ENCOUNTER — Encounter: Payer: Self-pay | Admitting: Gerontology

## 2017-12-30 DIAGNOSIS — G301 Alzheimer's disease with late onset: Secondary | ICD-10-CM | POA: Diagnosis not present

## 2017-12-30 DIAGNOSIS — F0280 Dementia in other diseases classified elsewhere without behavioral disturbance: Secondary | ICD-10-CM | POA: Diagnosis not present

## 2017-12-30 DIAGNOSIS — I5042 Chronic combined systolic (congestive) and diastolic (congestive) heart failure: Secondary | ICD-10-CM

## 2017-12-30 DIAGNOSIS — F39 Unspecified mood [affective] disorder: Secondary | ICD-10-CM | POA: Diagnosis not present

## 2017-12-30 DIAGNOSIS — I110 Hypertensive heart disease with heart failure: Secondary | ICD-10-CM | POA: Diagnosis not present

## 2017-12-30 NOTE — Progress Notes (Signed)
Location:  [facility] Room Number: [phone] Place of Service:  SNF [phone])  Margit Hanks, MD  Patient Care Team: Margit Hanks, MD as PCP - General (Internal Medicine) [name], MD (Internal Medicine)  Extended Emergency Contact Information Primary Emergency Contact: [facility] Address: [address]          [place], [place] [address] [place] Home Phone: [phone] Mobile Phone: [phone] Relation: Daughter Secondary Emergency Contact: Asa Saun[place] Home Phone: [phone] Relation: Grandaughter    Allergies: Penicillins and Penicillins  Chief Complaint  Patient presents with  . Medical Management of Chronic Issues    Routine Visit    HPI: Patient is 84 y.o. female who is being seen for routine issues of mood disorder, hypertension, and dementia.  Past Medical History:  Diagnosis Date  . Acute CVA (cerebrovascular accident) (HCC)   . Acute renal failure (HCC) 05/25/2013  . AKI (acute kidney injury) (HCC)   . CKD (chronic kidney disease) stage 3, GFR 30-59 ml/min (HCC) 05/21/2015  . Closed left hip fracture (HCC)   . Coronary artery disease   . Dementia   . Depression with anxiety   . Dysphagia due to recent stroke 05/31/2015  . Fall   . HLD (hyperlipidemia)   . Hx of arterial ischemic stroke 05/21/2015  . Hyperlipidemia   . Hypertension   . Hypokalemia   . Left displaced femoral neck fracture (HCC) 05/25/2016  . Leukocytosis 07/17/2016  . Protein-calorie malnutrition, severe 05/25/2016  . Syncope 05/24/2013  . Transient ischemic attack (TIA)   . Vitamin D deficiency 05/30/2015    Past Surgical History:  Procedure Laterality Date  . ABDOMINAL HYSTERECTOMY    . ANTERIOR APPROACH HEMI HIP ARTHROPLASTY Left 05/25/2016   Procedure: ANTERIOR APPROACH HEMI HIP ARTHROPLASTY;  Surgeon: Samson Frederic, MD;  Location: [facility];  Service: Orthopedics;  Laterality: Left;     Allergies as of 12/30/2017      Reactions   Penicillins Swelling   Facial and hand swelling per daughter Has patient had a PCN reaction causing immediate rash, facial/tongue/throat swelling, SOB or lightheadedness with hypotension: Yes Has patient had a PCN reaction causing severe rash involving mucus membranes or skin necrosis: No Has patient had a PCN reaction that required hospitalization No Has patient had a PCN reaction occurring within the last 10 years: No If all of the above answers are "NO", then may proceed with Cephalosporin use.   Penicillins Swelling   Has patient had a PCN reaction causing immediate rash, facial/tongue/throat swelling, SOB or lightheadedness with hypotension: Yes Has patient had a PCN reaction causing severe rash involving mucus membranes or skin necrosis: No Has patient had a PCN reaction that required hospitalization No Has patient had a PCN reaction occurring within the last 10 years: No If all of the above answers are "NO", then may proceed with Cephalosporin use.      Medication List        Accurate as of 12/30/17 11:59 PM. Always use your most recent med list.          aspirin 81 MG EC tablet Take 1 tablet (81 mg total) by mouth 2 (two) times daily with a meal.   bisacodyl 5 MG EC tablet Commonly known as:  DULCOLAX Take 1 tablet (5 mg total) by mouth daily as needed for moderate constipation.   busPIRone 5 MG tablet Commonly known as:  BUSPAR Take 5 mg by  mouth 2 (two) times daily.   carvedilol 3.125 MG tablet Commonly known as:  COREG Take 1 tablet (3.125 mg total) by mouth 2 (two) times daily with a meal.   divalproex 125 MG DR tablet Commonly known as:  DEPAKOTE Take 125 mg by mouth 2 (two) times daily.   feeding supplement Liqd Take 1 Container by mouth daily.   ferrous sulfate 325 (65 FE) MG tablet Take 325 mg by mouth daily with breakfast.   memantine 10 MG tablet Commonly known as:  NAMENDA Take 10 mg by mouth 2  (two) times daily.   mirtazapine 15 MG tablet Commonly known as:  REMERON Take 7.5 mg by mouth at bedtime. 1/2 tab   multivitamin tablet Take 1 tablet by mouth daily.   saccharomyces boulardii 250 MG capsule Commonly known as:  FLORASTOR Take 250 mg by mouth 2 (two) times daily.   traMADol 50 MG tablet Commonly known as:  ULTRAM Take 50 mg by mouth at bedtime.       No orders of the defined types were placed in this encounter.   Immunization History  Administered Date(s) Administered  . Influenza,inj,Quad PF,6+ Mos 07/19/2016  . Influenza-Unspecified 06/27/2017  . PPD Test 05/28/2016  . Pneumococcal Conjugate-13 07/11/2017    Social History   Tobacco Use  . Smoking status: Never Smoker  . Smokeless tobacco: Never Used  Substance Use Topics  . Alcohol use: No    Review of Systems   able to obtain secondary to dementia; nursing-no concerns     Vitals:   12/30/17 0948  BP: 134/78  Pulse: 94  Resp: 18  Temp: 98 F (36.7 C)  SpO2: 97%   Body mass index is 21.43 kg/m. Physical Exam  GENERAL APPEARANCE: Alert, No acute distress  SKIN: No diaphoresis rash HEENT: Unremarkable RESPIRATORY: Breathing is even, unlabored. Lung sounds are clear   CARDIOVASCULAR: Heart RRR no murmurs, rubs or gallops. No peripheral edema  GASTROINTESTINAL: Abdomen is soft, non-tender, not distended w/ normal bowel sounds.  GENITOURINARY: Bladder non tender, not distended  MUSCULOSKELETAL: No abnormal joints or musculature NEUROLOGIC: Cranial nerves 2-12 grossly intact. Moves all extremities PSYCHIATRIC: Mood and affect dementia, no behavioral issues  Patient Active Problem List   Diagnosis Date Noted  . Anxiety 07/25/2017  . 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 FTT (failure to thrive) in adult 08/04/2016  . Mood disorder (HCC) 07/28/2016  . Leukocytosis 07/17/2016  . Pressure injury of skin 07/17/2016  . Malnutrition of moderate degree 07/17/2016  . C. difficile  diarrhea 07/07/2016  . Pedal edema 06/27/2016  . Postoperative anemia due to acute blood loss 06/10/2016  . Newly recognized heart murmur 06/10/2016  . Acute renal failure superimposed on stage 3 chronic kidney disease (HCC) 06/10/2016  . Chronic combined systolic and diastolic CHF (congestive heart failure) (HCC) 05/28/2016  . Protein-calorie malnutrition, severe 05/25/2016  . Left displaced femoral neck fracture (HCC) 05/25/2016  . Fall 05/24/2016  . Hypokalemia 05/24/2016  . AKI (acute kidney injury) (HCC) 05/24/2016  . Hypertensive heart disease with chronic combined systolic and diastolic congestive heart failure (HCC)   . Dementia   . Depression with anxiety   . HLD (hyperlipidemia)   . Dysphagia due to recent stroke 05/31/2015  . Hyperlipidemia 05/30/2015  . Vitamin D deficiency 05/30/2015  . Acute CVA (cerebrovascular accident) (HCC)   . Hx of arterial ischemic stroke 05/21/2015  . CKD (chronic kidney disease) stage 3, GFR 30-59 ml/min (HCC) 05/21/2015  . Dementia 05/21/2015  .  Hypertension 05/21/2015  . UTI (urinary tract infection) 05/25/2013  . Acute renal failure (HCC) 05/25/2013  . Syncope 05/24/2013    CMP     Component Value Date/Time   NA 144 07/29/2017   K 4.6 07/29/2017   CL 105 01/03/2017 1526   CO2 23 01/03/2017 1526   GLUCOSE 103 (H) 01/03/2017 1526   BUN 27 (A) 07/29/2017   CREATININE 1.5 (A) 07/29/2017   CREATININE 1.55 (H) 01/03/2017 1526   CALCIUM 9.2 01/03/2017 1526   PROT 6.9 01/03/2017 1526   ALBUMIN 3.2 (L) 01/03/2017 1526   AST 12 (A) 07/29/2017   ALT 5 (A) 07/29/2017   ALKPHOS 91 07/29/2017   BILITOT 1.1 01/03/2017 1526   GFRNONAA 27 (L) 01/03/2017 1526   GFRAA 31 (L) 01/03/2017 1526   Recent Labs    07/29/17  NA 144  K 4.6  BUN 27*  CREATININE 1.5*   Recent Labs    07/29/17  AST 12*  ALT 5*  ALKPHOS 91   Recent Labs    07/29/17  WBC 5.6  HGB 11.8*  HCT 37  PLT 187   Recent Labs    07/29/17  CHOL 219*  LDLCALC 154    TRIG 93   No results found for: MICROALBUR Lab Results  Component Value Date   TSH 1.15 07/29/2017   Lab Results  Component Value Date   HGBA1C 5.9 07/29/2017   Lab Results  Component Value Date   CHOL 219 (A) 07/29/2017   HDL 46 07/29/2017   LDLCALC 154 07/29/2017   TRIG 93 07/29/2017   CHOLHDL 5.4 05/22/2015    Significant Diagnostic Results in last 30 days:  No results found.  Assessment and Plan  Mood disorder (HCC) Stable and chronic; continue Depakote 125 mg p.o. twice daily  Hypertensive heart disease with chronic combined systolic and diastolic congestive heart failure (HCC) Controlled; continue Coreg 3.125 p.o. twice daily  Dementia Stable; continue Namenda 10 mg p.o. twice daily     Chayse Gracey D. [name], MD

## 2017-12-30 NOTE — Progress Notes (Signed)
Opened in error; Disregard.

## 2018-01-24 ENCOUNTER — Encounter: Payer: Self-pay | Admitting: Internal Medicine

## 2018-01-24 NOTE — Assessment & Plan Note (Signed)
Stable and chronic; continue Depakote 125 mg p.o. twice daily

## 2018-01-24 NOTE — Assessment & Plan Note (Signed)
Controlled; continue Coreg 3.125 p.o. twice daily

## 2018-01-24 NOTE — Assessment & Plan Note (Signed)
Stable; continue Namenda 10 mg p.o. twice daily

## 2018-01-29 ENCOUNTER — Non-Acute Institutional Stay (SKILLED_NURSING_FACILITY): Payer: Medicare Other | Admitting: Internal Medicine

## 2018-01-29 ENCOUNTER — Encounter: Payer: Self-pay | Admitting: Internal Medicine

## 2018-01-29 DIAGNOSIS — E785 Hyperlipidemia, unspecified: Secondary | ICD-10-CM

## 2018-01-29 DIAGNOSIS — Z8673 Personal history of transient ischemic attack (TIA), and cerebral infarction without residual deficits: Secondary | ICD-10-CM | POA: Diagnosis not present

## 2018-01-29 DIAGNOSIS — I5042 Chronic combined systolic (congestive) and diastolic (congestive) heart failure: Secondary | ICD-10-CM

## 2018-01-29 NOTE — Progress Notes (Signed)
Location:  [facility] Room Number: [phone] Place of Service:  SNF [phone])  Margit Hanks, MD  Patient Care Team: Margit Hanks, MD as PCP - General (Internal Medicine) [name], MD (Internal Medicine)  Extended Emergency Contact Information Primary Emergency Contact: [facility] Address: [address]          [place], [place] [address] [place] Home Phone: [phone] Mobile Phone: [phone] Relation: Daughter Secondary Emergency Contact: Asa Saunas [place] Home Phone: [phone] Relation: Grandaughter    Allergies: Penicillins and Penicillins  Chief Complaint  Patient presents with  . Medical Management of Chronic Issues    Routine Visit    HPI: Patient is 84 y.o. female who is being seen for routine issues of history of arterial/stroke, hyperlipidemia, and combined systolic and diastolic congestive heart failure.  Past Medical History:  Diagnosis Date  . Acute CVA (cerebrovascular accident) (HCC)   . Acute renal failure (HCC) 05/25/2013  . AKI (acute kidney injury) (HCC)   . CKD (chronic kidney disease) stage 3, GFR 30-59 ml/min (HCC) 05/21/2015  . Closed left hip fracture (HCC)   . Coronary artery disease   . Dementia   . Depression with anxiety   . Dysphagia due to recent stroke 05/31/2015  . Fall   . HLD (hyperlipidemia)   . Hx of arterial ischemic stroke 05/21/2015  . Hyperlipidemia   . Hypertension   . Hypokalemia   . Left displaced femoral neck fracture (HCC) 05/25/2016  . Leukocytosis 07/17/2016  . Protein-calorie malnutrition, severe 05/25/2016  . Syncope 05/24/2013  . Transient ischemic attack (TIA)   . Vitamin D deficiency 05/30/2015    Past Surgical History:  Procedure Laterality Date  . ABDOMINAL HYSTERECTOMY    . ANTERIOR APPROACH HEMI HIP ARTHROPLASTY Left 05/25/2016   Procedure: ANTERIOR APPROACH HEMI HIP ARTHROPLASTY;  Surgeon: Samson Frederic, MD;   Location: [facility];  Service: Orthopedics;  Laterality: Left;    Allergies as of 01/29/2018      Reactions   Penicillins Swelling   Facial and hand swelling per daughter Has patient had a PCN reaction causing immediate rash, facial/tongue/throat swelling, SOB or lightheadedness with hypotension: Yes Has patient had a PCN reaction causing severe rash involving mucus membranes or skin necrosis: No Has patient had a PCN reaction that required hospitalization No Has patient had a PCN reaction occurring within the last 10 years: No If all of the above answers are "NO", then may proceed with Cephalosporin use.   Penicillins Swelling   Has patient had a PCN reaction causing immediate rash, facial/tongue/throat swelling, SOB or lightheadedness with hypotension: Yes Has patient had a PCN reaction causing severe rash involving mucus membranes or skin necrosis: No Has patient had a PCN reaction that required hospitalization No Has patient had a PCN reaction occurring within the last 10 years: No If all of the above answers are "NO", then may proceed with Cephalosporin use.      Medication List        Accurate as of 01/29/18 11:59 PM. Always use your most recent med list.          aspirin 81 MG EC tablet Take 1 tablet (81 mg total) by mouth 2 (two) times daily with a meal.   bisacodyl 5 MG EC tablet Commonly known as:  DULCOLAX Take 1 tablet (5 mg total) by mouth daily as needed for moderate constipation.   busPIRone 5 MG tablet Commonly known  as:  BUSPAR Take 5 mg by mouth 2 (two) times daily.   carvedilol 3.125 MG tablet Commonly known as:  COREG Take 1 tablet (3.125 mg total) by mouth 2 (two) times daily with a meal.   divalproex 125 MG DR tablet Commonly known as:  DEPAKOTE Take 125 mg by mouth 2 (two) times daily.   feeding supplement Liqd Take 1 Container by mouth daily.   ferrous sulfate 325 (65 FE) MG tablet Take 325 mg by mouth daily with breakfast.   memantine 10 MG  tablet Commonly known as:  NAMENDA Take 10 mg by mouth 2 (two) times daily.   mirtazapine 15 MG tablet Commonly known as:  REMERON Take 7.5 mg by mouth at bedtime. 1/2 tab   multivitamin tablet Take 1 tablet by mouth daily.   saccharomyces boulardii 250 MG capsule Commonly known as:  FLORASTOR Take 250 mg by mouth 2 (two) times daily.   traMADol 50 MG tablet Commonly known as:  ULTRAM Take 50 mg by mouth at bedtime.       No orders of the defined types were placed in this encounter.   Immunization History  Administered Date(s) Administered  . Influenza,inj,Quad PF,6+ Mos 07/19/2016  . Influenza-Unspecified 06/27/2017  . PPD Test 05/28/2016  . Pneumococcal Conjugate-13 07/11/2017    Social History   Tobacco Use  . Smoking status: Never Smoker  . Smokeless tobacco: Never Used  Substance Use Topics  . Alcohol use: No    Review of Systems  DATA OBTAINED: from patient-limited; nursing no acute concerns GENERAL:  no fevers, fatigue, appetite changes SKIN: No itching, rash HEENT: No complaint RESPIRATORY: No cough, wheezing, SOB CARDIAC: No chest pain, palpitations, lower extremity edema  GI: No abdominal pain, No N/V/D or constipation, No heartburn or reflux  GU: No dysuria, frequency or urgency, or incontinence  MUSCULOSKELETAL: No unrelieved bone/joint pain NEUROLOGIC: No headache, dizziness  PSYCHIATRIC: No overt anxiety or sadness  Vitals:   01/29/18 1401  BP: 113/88  Pulse: 82  Resp: 18  Temp: 98.1 F (36.7 C)  SpO2: 97%   Body mass index is 21.75 kg/m. Physical Exam  GENERAL APPEARANCE: Alert, conversant, No acute distress  SKIN: No diaphoresis rash HEENT: Unremarkable RESPIRATORY: Breathing is even, unlabored. Lung sounds are clear   CARDIOVASCULAR: Heart RRR no murmurs, rubs or gallops. No peripheral edema  GASTROINTESTINAL: Abdomen is soft, non-tender, not distended w/ normal bowel sounds.  GENITOURINARY: Bladder non tender, not distended    MUSCULOSKELETAL: No abnormal joints or musculature NEUROLOGIC: Cranial nerves 2-12 grossly intact. Moves all extremities PSYCHIATRIC: Mood and affect with dementia, no behavioral issues  Patient Active Problem List   Diagnosis Date Noted  . Anxiety 07/25/2017  . 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 FTT (failure to thrive) in adult 08/04/2016  . Mood disorder (HCC) 07/28/2016  . Leukocytosis 07/17/2016  . Pressure injury of skin 07/17/2016  . Malnutrition of moderate degree 07/17/2016  . C. difficile diarrhea 07/07/2016  . Pedal edema 06/27/2016  . Postoperative anemia due to acute blood loss 06/10/2016  . Newly recognized heart murmur 06/10/2016  . Acute renal failure superimposed on stage 3 chronic kidney disease (HCC) 06/10/2016  . Chronic combined systolic and diastolic CHF (congestive heart failure) (HCC) 05/28/2016  . Protein-calorie malnutrition, severe 05/25/2016  . Left displaced femoral neck fracture (HCC) 05/25/2016  . Fall 05/24/2016  . Hypokalemia 05/24/2016  . AKI (acute kidney injury) (HCC) 05/24/2016  . Hypertensive heart disease with chronic combined systolic and diastolic  congestive heart failure (HCC)   . Dementia   . Depression with anxiety   . HLD (hyperlipidemia)   . Dysphagia due to recent stroke 05/31/2015  . Hyperlipidemia 05/30/2015  . Vitamin D deficiency 05/30/2015  . Acute CVA (cerebrovascular accident) (HCC)   . Hx of arterial ischemic stroke 05/21/2015  . CKD (chronic kidney disease) stage 3, GFR 30-59 ml/min (HCC) 05/21/2015  . Dementia 05/21/2015  . Hypertension 05/21/2015  . UTI (urinary tract infection) 05/25/2013  . Acute renal failure (HCC) 05/25/2013  . Syncope 05/24/2013    CMP     Component Value Date/Time   NA 144 07/29/2017   K 4.6 07/29/2017   CL 105 01/03/2017 1526   CO2 23 01/03/2017 1526   GLUCOSE 103 (H) 01/03/2017 1526   BUN 27 (A) 07/29/2017   CREATININE 1.5 (A) 07/29/2017   CREATININE 1.55 (H) 01/03/2017 1526    CALCIUM 9.2 01/03/2017 1526   PROT 6.9 01/03/2017 1526   ALBUMIN 3.2 (L) 01/03/2017 1526   AST 12 (A) 07/29/2017   ALT 5 (A) 07/29/2017   ALKPHOS 91 07/29/2017   BILITOT 1.1 01/03/2017 1526   GFRNONAA 27 (L) 01/03/2017 1526   GFRAA 31 (L) 01/03/2017 1526   Recent Labs    07/29/17  NA 144  K 4.6  BUN 27*  CREATININE 1.5*   Recent Labs    07/29/17  AST 12*  ALT 5*  ALKPHOS 91   Recent Labs    07/29/17  WBC 5.6  HGB 11.8*  HCT 37  PLT 187   Recent Labs    07/29/17  CHOL 219*  LDLCALC 154  TRIG 93   No results found for: MICROALBUR Lab Results  Component Value Date   TSH 1.15 07/29/2017   Lab Results  Component Value Date   HGBA1C 5.9 07/29/2017   Lab Results  Component Value Date   CHOL 219 (A) 07/29/2017   HDL 46 07/29/2017   LDLCALC 154 07/29/2017   TRIG 93 07/29/2017   CHOLHDL 5.4 05/22/2015    Significant Diagnostic Results in last 30 days:  No results found.  Assessment and Plan  Hx of arterial ischemic stroke Stable; continue ASA 81 mg twice daily  Hyperlipidemia Patient on no meds secondary to age  Chronic combined systolic and diastolic CHF (congestive heart failure) (HCC) No reported exacerbation; continue Coreg 3.125 mg twice daily; patient is not on Lasix at this time     Randon Goldsmith. [name], MD

## 2018-02-19 IMAGING — CR DG HIP (WITH OR WITHOUT PELVIS) 2-3V*L*
3 series · 3 of 3 positions shown · non-contrast
Comparison: None.

CLINICAL DATA: Fell.  Left hip pain.

EXAM:
DG HIP (WITH OR WITHOUT PELVIS) 2-3V LEFT

[x pelvis (1 of 2)]
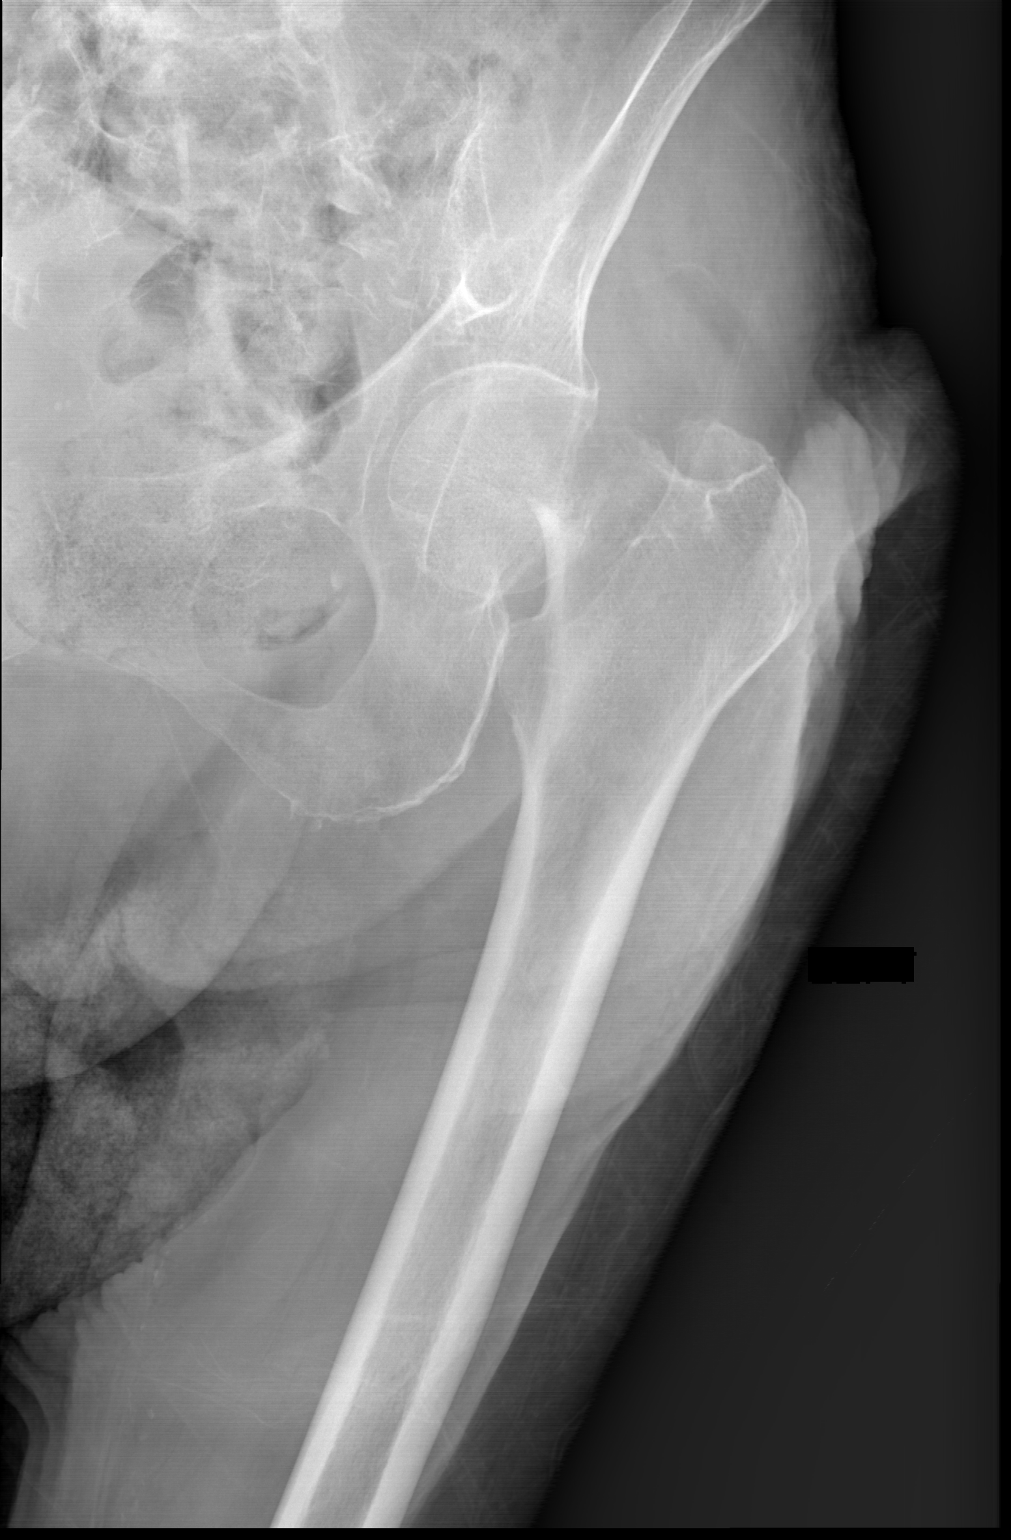

[x pelvis (2 of 2)]
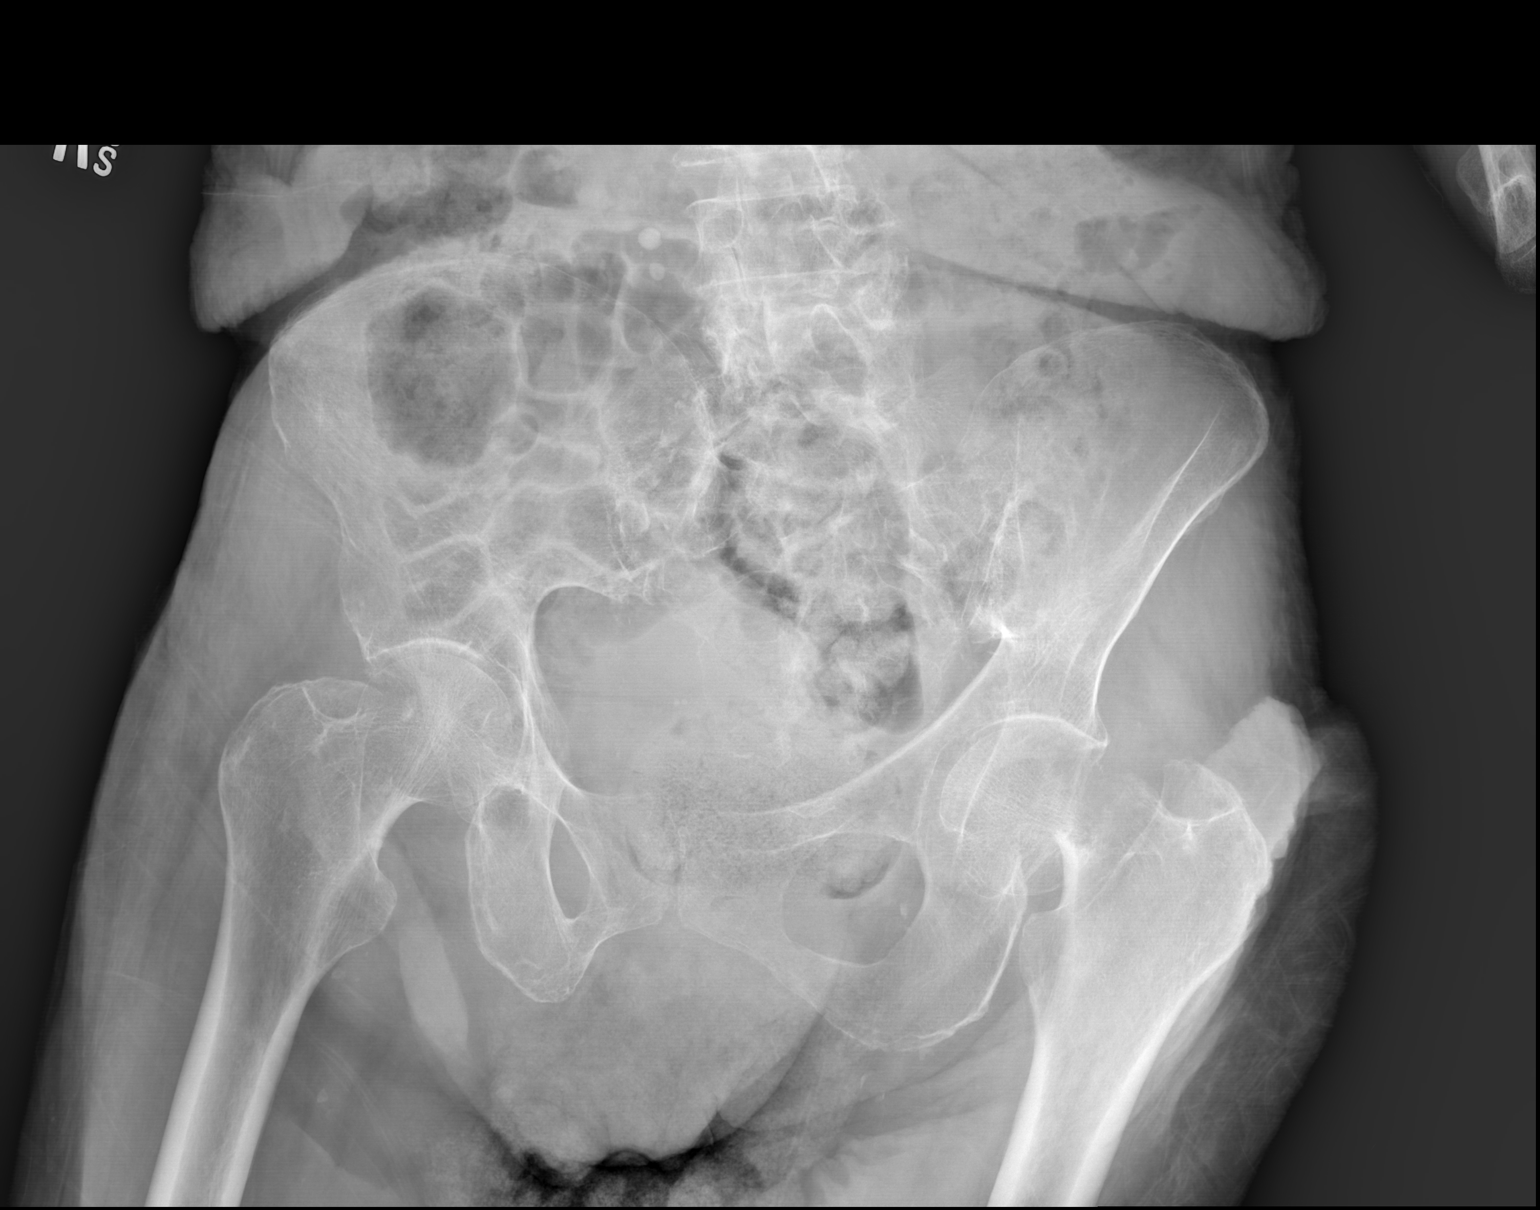

[w hip lat left]
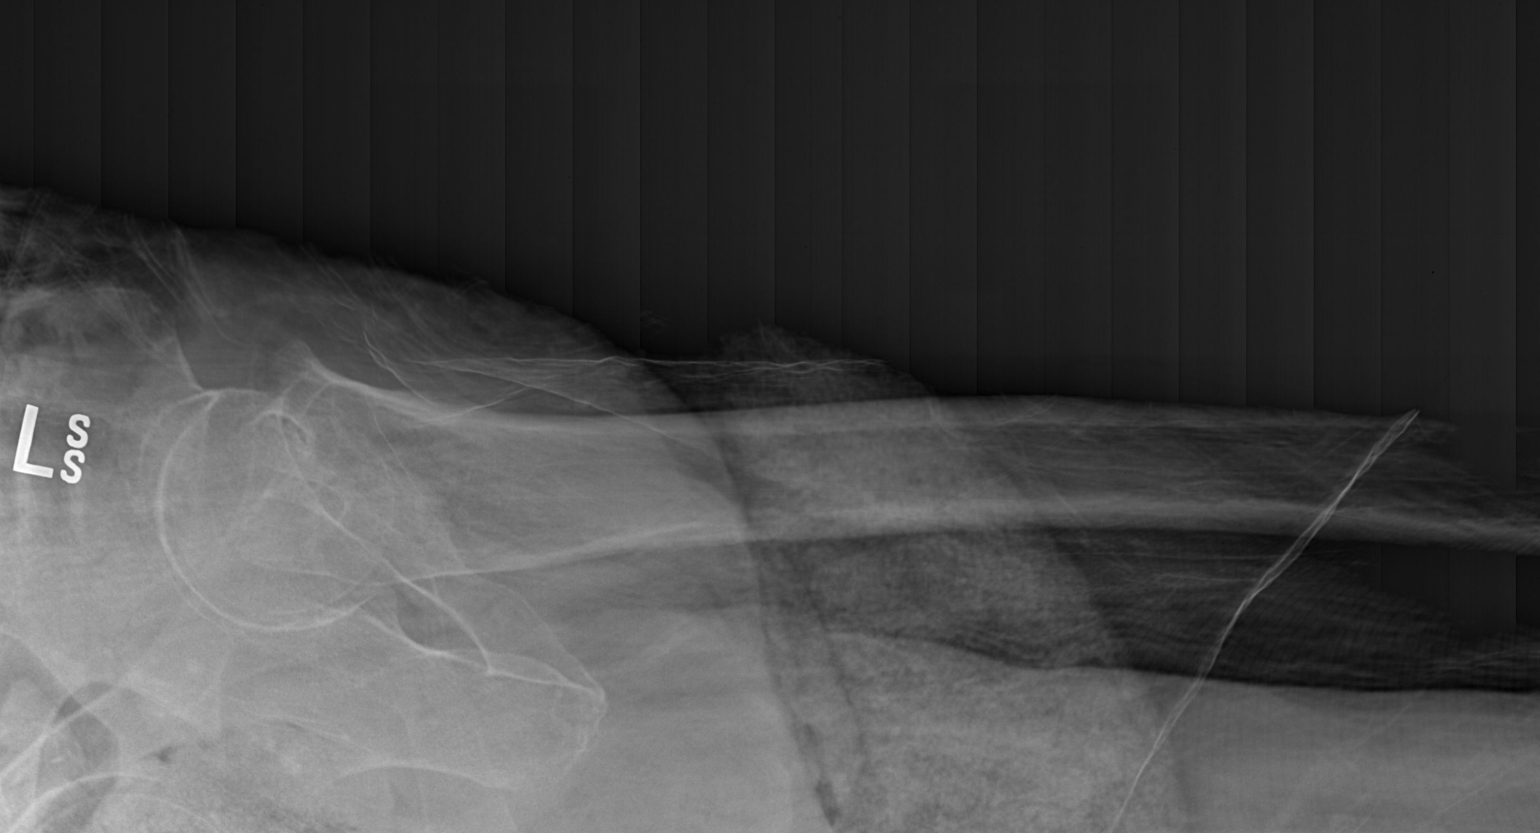

[3 of 3 positions shown; findings below may reference images not displayed]

FINDINGS: There is a displaced femoral neck fracture. Both hips are normally
located. The pubic symphysis and SI joints are grossly intact. No
obvious pelvic fractures.
IMPRESSION: Displaced left femoral neck fracture.

## 2018-02-21 IMAGING — DX DG PORTABLE PELVIS
1 series · 1 of 1 positions shown · non-contrast
Comparison: None.

CLINICAL DATA: Status post left hip replacement.

EXAM:
PORTABLE PELVIS 1-2 VIEWS

[pelvis ap]
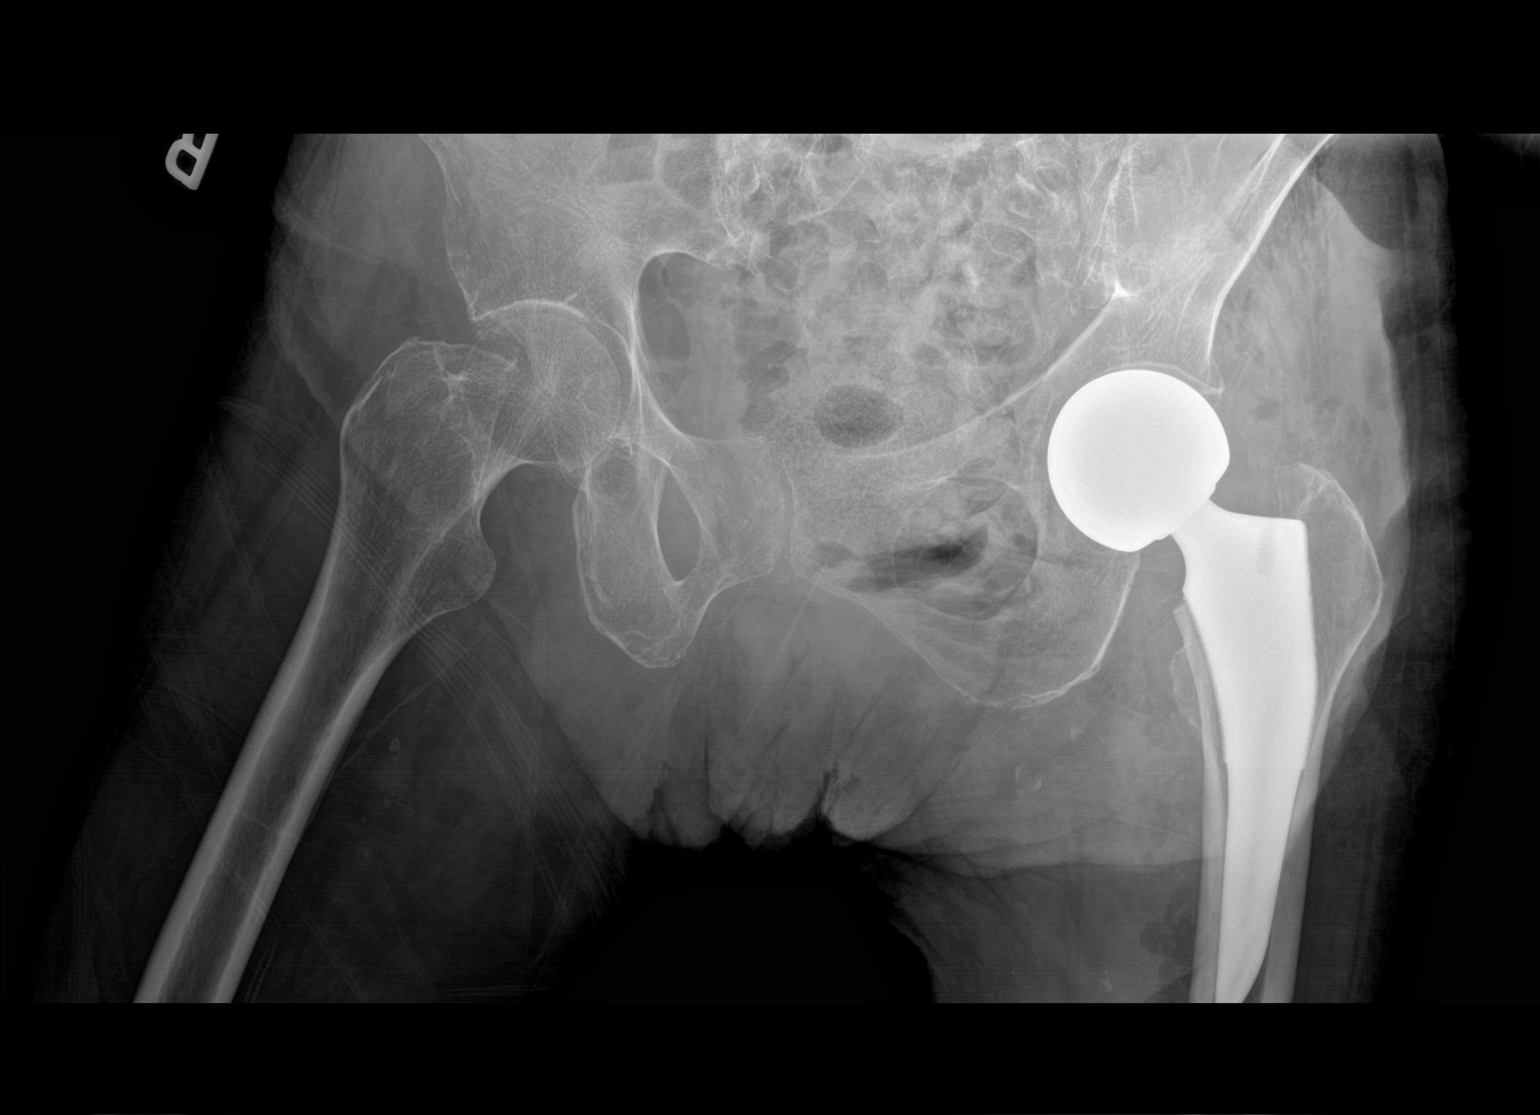

[1 of 1 positions shown; findings below may reference images not displayed]

FINDINGS: A portable AP view of the left hip and a portion of the pelvis
demonstrates a left bipolar hip prosthesis in satisfactory position
and alignment. No fracture or dislocation is seen. Diffuse
osteopenia is noted.
IMPRESSION: Satisfactory postoperative appearance of a left hip prosthesis.

## 2018-02-24 ENCOUNTER — Encounter: Payer: Self-pay | Admitting: Internal Medicine

## 2018-02-24 NOTE — Assessment & Plan Note (Signed)
No reported exacerbation; continue Coreg 3.125 mg twice daily; patient is not on Lasix at this time

## 2018-02-24 NOTE — Assessment & Plan Note (Signed)
Stable; continue ASA 81 mg twice daily 

## 2018-02-24 NOTE — Assessment & Plan Note (Signed)
Patient on no meds secondary to age

## 2018-02-25 ENCOUNTER — Encounter: Payer: Self-pay | Admitting: Internal Medicine

## 2018-02-25 ENCOUNTER — Non-Acute Institutional Stay (SKILLED_NURSING_FACILITY): Payer: Medicare Other | Admitting: Internal Medicine

## 2018-02-25 DIAGNOSIS — G301 Alzheimer's disease with late onset: Secondary | ICD-10-CM | POA: Diagnosis not present

## 2018-02-25 DIAGNOSIS — F39 Unspecified mood [affective] disorder: Secondary | ICD-10-CM

## 2018-02-25 DIAGNOSIS — R627 Adult failure to thrive: Secondary | ICD-10-CM

## 2018-02-25 DIAGNOSIS — F0280 Dementia in other diseases classified elsewhere without behavioral disturbance: Secondary | ICD-10-CM

## 2018-02-25 NOTE — Progress Notes (Signed)
Location:  [facility] Room Number: [id number] Place of Service:  SNF (31)  Margit HanksAlexander, Anne D, MD  Patient Care Team: Margit HanksAlexander, Anne D, MD as PCP - General (Internal Medicine) Pearson GrippeKim, James, MD (Internal Medicine)  Extended Emergency Contact Information Primary Emergency Contact: [facility],Shirley Address: [address]          [place], [place] [address] [place] Home Phone: [phone] Mobile Phone: [phone] Relation: Daughter Secondary Emergency Contact: Asa Saunasliver,Cynthia  [place] Home Phone: [phone] Relation: Grandaughter    Allergies: Penicillins and Penicillins  Chief Complaint  Patient presents with  . Medical Management of Chronic Issues    Routine visit    HPI: Patient is 84 y.o. female who is being seen for routine issues of mood disorder, dementia, and failure to thrive.  Past Medical History:  Diagnosis Date  . Acute CVA (cerebrovascular accident) (HCC)   . Acute renal failure (HCC) 05/25/2013  . AKI (acute kidney injury) (HCC)   . CKD (chronic kidney disease) stage 3, GFR 30-59 ml/min (HCC) 05/21/2015  . Closed left hip fracture (HCC)   . Coronary artery disease   . Dementia   . Depression with anxiety   . Dysphagia due to recent stroke 05/31/2015  . Fall   . HLD (hyperlipidemia)   . Hx of arterial ischemic stroke 05/21/2015  . Hyperlipidemia   . Hypertension   . Hypokalemia   . Left displaced femoral neck fracture (HCC) 05/25/2016  . Leukocytosis 07/17/2016  . Protein-calorie malnutrition, severe 05/25/2016  . Syncope 05/24/2013  . Transient ischemic attack (TIA)   . Vitamin D deficiency 05/30/2015    Past Surgical History:  Procedure Laterality Date  . ABDOMINAL HYSTERECTOMY    . ANTERIOR APPROACH HEMI HIP ARTHROPLASTY Left 05/25/2016   Procedure: ANTERIOR APPROACH HEMI HIP ARTHROPLASTY;  Surgeon: Samson FredericBrian Swinteck, MD;  Location: [facility];  Service: Orthopedics;  Laterality:  Left;    Allergies as of 02/25/2018      Reactions   Penicillins Swelling   Facial and hand swelling per daughter Has patient had a PCN reaction causing immediate rash, facial/tongue/throat swelling, SOB or lightheadedness with hypotension: Yes Has patient had a PCN reaction causing severe rash involving mucus membranes or skin necrosis: No Has patient had a PCN reaction that required hospitalization No Has patient had a PCN reaction occurring within the last 10 years: No If all of the above answers are "NO", then may proceed with Cephalosporin use.   Penicillins Swelling   Has patient had a PCN reaction causing immediate rash, facial/tongue/throat swelling, SOB or lightheadedness with hypotension: Yes Has patient had a PCN reaction causing severe rash involving mucus membranes or skin necrosis: No Has patient had a PCN reaction that required hospitalization No Has patient had a PCN reaction occurring within the last 10 years: No If all of the above answers are "NO", then may proceed with Cephalosporin use.      Medication List        Accurate as of 02/25/18 11:59 PM. Always use your most recent med list.          aspirin 81 MG EC tablet Take 1 tablet (81 mg total) by mouth 2 (two) times daily with a meal.   bisacodyl 5 MG EC tablet Commonly known as:  DULCOLAX Take 1 tablet (5 mg total) by mouth daily as needed for moderate constipation.   carvedilol 3.125 MG tablet Commonly known as:  COREG Take 1  tablet (3.125 mg total) by mouth 2 (two) times daily with a meal.   divalproex 125 MG DR tablet Commonly known as:  DEPAKOTE Take 125 mg by mouth 2 (two) times daily.   feeding supplement Liqd Take 1 Container by mouth daily.   ferrous sulfate 325 (65 FE) MG tablet Take 325 mg by mouth daily with breakfast.   memantine 10 MG tablet Commonly known as:  NAMENDA Take 10 mg by mouth 2 (two) times daily.   mirtazapine 15 MG tablet Commonly known as:  REMERON Take 7.5 mg by  mouth at bedtime. 1/2 tab   multivitamin tablet Take 1 tablet by mouth daily.   saccharomyces boulardii 250 MG capsule Commonly known as:  FLORASTOR Take 250 mg by mouth 2 (two) times daily.   traMADol 50 MG tablet Commonly known as:  ULTRAM Take 50 mg by mouth at bedtime.       No orders of the defined types were placed in this encounter.   Immunization History  Administered Date(s) Administered  . Influenza,inj,Quad PF,6+ Mos 07/19/2016  . Influenza-Unspecified 06/27/2017  . PPD Test 05/28/2016  . Pneumococcal Conjugate-13 07/11/2017    Social History   Tobacco Use  . Smoking status: Never Smoker  . Smokeless tobacco: Never Used  Substance Use Topics  . Alcohol use: No    Review of Systems  DATA OBTAINED: from patient-limited; nursing-no acute concerns GENERAL:  no fevers, fatigue, appetite changes SKIN: No itching, rash HEENT: No complaint RESPIRATORY: No cough, wheezing, SOB CARDIAC: No chest pain, palpitations, lower extremity edema  GI: No abdominal pain, No N/V/D or constipation, No heartburn or reflux  GU: No dysuria, frequency or urgency, or incontinence  MUSCULOSKELETAL: No unrelieved bone/joint pain NEUROLOGIC: No headache, dizziness  PSYCHIATRIC: No overt anxiety or sadness  Vitals:   02/25/18 1338  BP: 138/83  Pulse: 70  Resp: 16  Temp: 98.2 F (36.8 C)   Body mass index is 21.61 kg/m. Physical Exam  GENERAL APPEARANCE: Alert, conversant, No acute distress  SKIN: No diaphoresis rash HEENT: Unremarkable RESPIRATORY: Breathing is even, unlabored. Lung sounds are clear   CARDIOVASCULAR: Heart RRR no murmurs, rubs or gallops. No peripheral edema  GASTROINTESTINAL: Abdomen is soft, non-tender, not distended w/ normal bowel sounds.  GENITOURINARY: Bladder non tender, not distended  MUSCULOSKELETAL: No abnormal joints or musculature NEUROLOGIC: Cranial nerves 2-12 grossly intact. Moves all extremities PSYCHIATRIC: Mood and affect with  dementia, no behavioral issues  Patient Active Problem List   Diagnosis Date Noted  . Anxiety 07/25/2017  . 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 FTT (failure to thrive) in adult 08/04/2016  . Mood disorder (HCC) 07/28/2016  . Leukocytosis 07/17/2016  . Pressure injury of skin 07/17/2016  . Malnutrition of moderate degree 07/17/2016  . C. difficile diarrhea 07/07/2016  . Pedal edema 06/27/2016  . Postoperative anemia due to acute blood loss 06/10/2016  . Newly recognized heart murmur 06/10/2016  . Acute renal failure superimposed on stage 3 chronic kidney disease (HCC) 06/10/2016  . Chronic combined systolic and diastolic CHF (congestive heart failure) (HCC) 05/28/2016  . Protein-calorie malnutrition, severe 05/25/2016  . Left displaced femoral neck fracture (HCC) 05/25/2016  . Fall 05/24/2016  . Hypokalemia 05/24/2016  . AKI (acute kidney injury) (HCC) 05/24/2016  . Hypertensive heart disease with chronic combined systolic and diastolic congestive heart failure (HCC)   . Dementia   . Depression with anxiety   . HLD (hyperlipidemia)   . Dysphagia due to recent stroke 05/31/2015  .  Hyperlipidemia 05/30/2015  . Vitamin D deficiency 05/30/2015  . Acute CVA (cerebrovascular accident) (HCC)   . Hx of arterial ischemic stroke 05/21/2015  . CKD (chronic kidney disease) stage 3, GFR 30-59 ml/min (HCC) 05/21/2015  . Dementia 05/21/2015  . Hypertension 05/21/2015  . UTI (urinary tract infection) 05/25/2013  . Acute renal failure (HCC) 05/25/2013  . Syncope 05/24/2013    CMP     Component Value Date/Time   NA 144 07/29/2017   K 4.6 07/29/2017   CL 105 01/03/2017 1526   CO2 23 01/03/2017 1526   GLUCOSE 103 (H) 01/03/2017 1526   BUN 27 (A) 07/29/2017   CREATININE 1.5 (A) 07/29/2017   CREATININE 1.55 (H) 01/03/2017 1526   CALCIUM 9.2 01/03/2017 1526   PROT 6.9 01/03/2017 1526   ALBUMIN 3.2 (L) 01/03/2017 1526   AST 12 (A) 07/29/2017   ALT 5 (A) 07/29/2017   ALKPHOS 91  07/29/2017   BILITOT 1.1 01/03/2017 1526   GFRNONAA 27 (L) 01/03/2017 1526   GFRAA 31 (L) 01/03/2017 1526   Recent Labs    07/29/17  NA 144  K 4.6  BUN 27*  CREATININE 1.5*   Recent Labs    07/29/17  AST 12*  ALT 5*  ALKPHOS 91   Recent Labs    07/29/17  WBC 5.6  HGB 11.8*  HCT 37  PLT 187   Recent Labs    07/29/17  CHOL 219*  LDLCALC 154  TRIG 93   No results found for: MICROALBUR Lab Results  Component Value Date   TSH 1.15 07/29/2017   Lab Results  Component Value Date   HGBA1C 5.9 07/29/2017   Lab Results  Component Value Date   CHOL 219 (A) 07/29/2017   HDL 46 07/29/2017   LDLCALC 154 07/29/2017   TRIG 93 07/29/2017   CHOLHDL 5.4 05/22/2015    Significant Diagnostic Results in last 30 days:  No results found.  Assessment and Plan  1`q Mood disorder (HCC) Stable; continue Depakote 125 mg twice daily  Dementia Stable on Namenda 10 mg twice daily; continue current treatment  FTT (failure to thrive) in adult Continue Remeron 7.5 keep a patient's appetite; to continue to monitor     Margit Hanks, MD

## 2018-03-15 ENCOUNTER — Encounter: Payer: Self-pay | Admitting: Internal Medicine

## 2018-03-15 NOTE — Assessment & Plan Note (Signed)
Stable on Namenda 10 mg twice daily; continue current treatment

## 2018-03-15 NOTE — Assessment & Plan Note (Signed)
Stable; continue Depakote 125 mg twice daily 

## 2018-03-15 NOTE — Assessment & Plan Note (Signed)
Continue Remeron 7.5 keep a patient's appetite; to continue to monitor

## 2018-03-20 LAB — BASIC METABOLIC PANEL
BUN: 27 — AB (ref 4–21)
CREATININE: 1.3 — AB (ref 0.5–1.1)
GLUCOSE: 85
Potassium: 4.8 (ref 3.4–5.3)
Sodium: 144 (ref 137–147)

## 2018-03-20 LAB — CBC AND DIFFERENTIAL
HCT: 36 (ref 36–46)
Hemoglobin: 11.8 — AB (ref 12.0–16.0)
Platelets: 220 (ref 150–399)
WBC: 5.2

## 2018-03-20 LAB — VITAMIN D 25 HYDROXY (VIT D DEFICIENCY, FRACTURES): Vit D, 25-Hydroxy: 39.99

## 2018-03-20 LAB — HEPATIC FUNCTION PANEL
ALT: 16 (ref 7–35)
AST: 19 (ref 13–35)
Alkaline Phosphatase: 85 (ref 25–125)
BILIRUBIN, TOTAL: 0.2

## 2018-03-25 ENCOUNTER — Encounter: Payer: Self-pay | Admitting: Internal Medicine

## 2018-03-25 ENCOUNTER — Non-Acute Institutional Stay (SKILLED_NURSING_FACILITY): Payer: Medicare Other | Admitting: Internal Medicine

## 2018-03-25 DIAGNOSIS — I5042 Chronic combined systolic (congestive) and diastolic (congestive) heart failure: Secondary | ICD-10-CM

## 2018-03-25 DIAGNOSIS — I110 Hypertensive heart disease with heart failure: Secondary | ICD-10-CM

## 2018-03-25 DIAGNOSIS — E785 Hyperlipidemia, unspecified: Secondary | ICD-10-CM

## 2018-03-25 NOTE — Progress Notes (Signed)
Location:  [facility] Room Number: [phone] Place of Service:  SNF [phone])  Randon Goldsmith. [name], MD  Patient Care Team: Margit Hanks, MD as PCP - General (Internal Medicine) [name], MD (Internal Medicine)  Extended Emergency Contact Information Primary Emergency Contact: [facility] Address: [address]          [place], [place] [address] [place] Home Phone: [phone] Mobile Phone: [phone] Relation: Daughter Secondary Emergency Contact: Asa [place] Home Phone: [phone] Relation: Granddaughter    Allergies: Penicillins and Penicillins  Chief Complaint  Patient presents with  . Medical Management of Chronic Issues    Routine Visit    HPI: Patient is 84 y.o. female who being seen for routine issues of congestive heart failure, hypertension, and hyperlipidemia.  Past Medical History:  Diagnosis Date  . Acute CVA (cerebrovascular accident) (HCC)   . Acute renal failure (HCC) 05/25/2013  . AKI (acute kidney injury) (HCC)   . CKD (chronic kidney disease) stage 3, GFR 30-59 ml/min (HCC) 05/21/2015  . Closed left hip fracture (HCC)   . Coronary artery disease   . Dementia   . Depression with anxiety   . Dysphagia due to recent stroke 05/31/2015  . Fall   . HLD (hyperlipidemia)   . Hx of arterial ischemic stroke 05/21/2015  . Hyperlipidemia   . Hypertension   . Hypokalemia   . Left displaced femoral neck fracture (HCC) 05/25/2016  . Leukocytosis 07/17/2016  . Protein-calorie malnutrition, severe 05/25/2016  . Syncope 05/24/2013  . Transient ischemic attack (TIA)   . Vitamin D deficiency 05/30/2015    Past Surgical History:  Procedure Laterality Date  . ABDOMINAL HYSTERECTOMY    . ANTERIOR APPROACH HEMI HIP ARTHROPLASTY Left 05/25/2016   Procedure: ANTERIOR APPROACH HEMI HIP ARTHROPLASTY;  Surgeon: Samson Frederic, MD;  Location: [facility];  Service: Orthopedics;   Laterality: Left;    Allergies as of 03/25/2018      Reactions   Penicillins Swelling   Facial and hand swelling per daughter Has patient had a PCN reaction causing immediate rash, facial/tongue/throat swelling, SOB or lightheadedness with hypotension: Yes Has patient had a PCN reaction causing severe rash involving mucus membranes or skin necrosis: No Has patient had a PCN reaction that required hospitalization No Has patient had a PCN reaction occurring within the last 10 years: No If all of the above answers are "NO", then may proceed with Cephalosporin use.   Penicillins Swelling   Has patient had a PCN reaction causing immediate rash, facial/tongue/throat swelling, SOB or lightheadedness with hypotension: Yes Has patient had a PCN reaction causing severe rash involving mucus membranes or skin necrosis: No Has patient had a PCN reaction that required hospitalization No Has patient had a PCN reaction occurring within the last 10 years: No If all of the above answers are "NO", then may proceed with Cephalosporin use.      Medication List        Accurate as of 03/25/18 11:59 PM. Always use your most recent med list.          aspirin 81 MG EC tablet Take 1 tablet (81 mg total) by mouth 2 (two) times daily with a meal.   bisacodyl 5 MG EC tablet Commonly known as:  DULCOLAX Take 1 tablet (5 mg total) by mouth daily as needed for moderate constipation.   carvedilol 3.125 MG tablet Commonly known as:  COREG Take 1 tablet (  3.125 mg total) by mouth 2 (two) times daily with a meal.   divalproex 125 MG DR tablet Commonly known as:  DEPAKOTE Take 125 mg by mouth 2 (two) times daily.   feeding supplement Liqd Take 1 Container by mouth daily.   ferrous sulfate 325 (65 FE) MG tablet Take 325 mg by mouth daily with breakfast.   memantine 10 MG tablet Commonly known as:  NAMENDA Take 10 mg by mouth 2 (two) times daily.   mirtazapine 15 MG tablet Commonly known as:   REMERON Take 7.5 mg by mouth at bedtime. 1/2 tab   multivitamin tablet Take 1 tablet by mouth daily.   saccharomyces boulardii 250 MG capsule Commonly known as:  FLORASTOR Take 250 mg by mouth 2 (two) times daily.   traMADol 50 MG tablet Commonly known as:  ULTRAM Take 50 mg by mouth at bedtime.       No orders of the defined types were placed in this encounter.   Immunization History  Administered Date(s) Administered  . Influenza,inj,Quad PF,6+ Mos 07/19/2016  . Influenza-Unspecified 06/27/2017  . PPD Test 05/28/2016  . Pneumococcal Conjugate-13 07/11/2017    Social History   Tobacco Use  . Smoking status: Never Smoker  . Smokeless tobacco: Never Used  Substance Use Topics  . Alcohol use: No    Review of Systems  DATA OBTAINED: from patient-limited nursing-no acute concerns GENERAL:  no fevers, fatigue, appetite changes SKIN: No itching, rash HEENT: No complaint RESPIRATORY: No cough, wheezing, SOB CARDIAC: No chest pain, palpitations, lower extremity edema  GI: No abdominal pain, No N/V/D or constipation, No heartburn or reflux  GU: No dysuria, frequency or urgency, or incontinence  MUSCULOSKELETAL: No unrelieved bone/joint pain NEUROLOGIC: No headache, dizziness  PSYCHIATRIC: No overt anxiety or sadness  Vitals:   03/25/18 1237  BP: 138/81  Pulse: 84  Resp: 18  Temp: 97.6 F (36.4 C)   Body mass index is 21.79 kg/m. Physical Exam  GENERAL APPEARANCE: Alert, conversant, No acute distress  SKIN: No diaphoresis rash HEENT: Unremarkable RESPIRATORY: Breathing is even, unlabored. Lung sounds are clear   CARDIOVASCULAR: Heart RRR no murmurs, rubs or gallops. No peripheral edema  GASTROINTESTINAL: Abdomen is soft, non-tender, not distended w/ normal bowel sounds.  GENITOURINARY: Bladder non tender, not distended  MUSCULOSKELETAL: No abnormal joints or musculature NEUROLOGIC: Cranial nerves 2-12 grossly intact. Moves all extremities PSYCHIATRIC:  Mood and affect with dementia, no behavioral issues  Patient Active Problem List   Diagnosis Date Noted  . Anxiety 07/25/2017  . 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 FTT (failure to thrive) in adult 08/04/2016  . Mood disorder (HCC) 07/28/2016  . Leukocytosis 07/17/2016  . Pressure injury of skin 07/17/2016  . Malnutrition of moderate degree 07/17/2016  . C. difficile diarrhea 07/07/2016  . Pedal edema 06/27/2016  . Postoperative anemia due to acute blood loss 06/10/2016  . Newly recognized heart murmur 06/10/2016  . Acute renal failure superimposed on stage 3 chronic kidney disease (HCC) 06/10/2016  . Chronic combined systolic and diastolic CHF (congestive heart failure) (HCC) 05/28/2016  . Protein-calorie malnutrition, severe 05/25/2016  . Left displaced femoral neck fracture (HCC) 05/25/2016  . Fall 05/24/2016  . Hypokalemia 05/24/2016  . AKI (acute kidney injury) (HCC) 05/24/2016  . Hypertensive heart disease with chronic combined systolic and diastolic congestive heart failure (HCC)   . Dementia   . Depression with anxiety   . HLD (hyperlipidemia)   . Dysphagia due to recent stroke 05/31/2015  . Hyperlipidemia  05/30/2015  . Vitamin D deficiency 05/30/2015  . Acute CVA (cerebrovascular accident) (HCC)   . Hx of arterial ischemic stroke 05/21/2015  . CKD (chronic kidney disease) stage 3, GFR 30-59 ml/min (HCC) 05/21/2015  . Dementia 05/21/2015  . Hypertension 05/21/2015  . UTI (urinary tract infection) 05/25/2013  . Acute renal failure (HCC) 05/25/2013  . Syncope 05/24/2013    CMP     Component Value Date/Time   NA 144 03/20/2018   K 4.8 03/20/2018   CL 105 01/03/2017 1526   CO2 23 01/03/2017 1526   GLUCOSE 103 (H) 01/03/2017 1526   BUN 27 (A) 03/20/2018   CREATININE 1.3 (A) 03/20/2018   CREATININE 1.55 (H) 01/03/2017 1526   CALCIUM 9.2 01/03/2017 1526   PROT 6.9 01/03/2017 1526   ALBUMIN 3.2 (L) 01/03/2017 1526   AST 19 03/20/2018   ALT 16 03/20/2018    ALKPHOS 85 03/20/2018   BILITOT 1.1 01/03/2017 1526   GFRNONAA 27 (L) 01/03/2017 1526   GFRAA 31 (L) 01/03/2017 1526   Recent Labs    07/29/17 03/20/18  NA 144 144  K 4.6 4.8  BUN 27* 27*  CREATININE 1.5* 1.3*   Recent Labs    07/29/17 03/20/18  AST 12* 19  ALT 5* 16  ALKPHOS 91 85   Recent Labs    07/29/17 03/20/18  WBC 5.6 5.2  HGB 11.8* 11.8*  HCT 37 36  PLT 187 220   Recent Labs    07/29/17  CHOL 219*  LDLCALC 154  TRIG 93   No results found for: MICROALBUR Lab Results  Component Value Date   TSH 1.15 07/29/2017   Lab Results  Component Value Date   HGBA1C 5.9 07/29/2017   Lab Results  Component Value Date   CHOL 219 (A) 07/29/2017   HDL 46 07/29/2017   LDLCALC 154 07/29/2017   TRIG 93 07/29/2017   CHOLHDL 5.4 05/22/2015    Significant Diagnostic Results in last 30 days:  No results found.  Assessment and Plan  Chronic combined systolic and diastolic CHF (congestive heart failure) (HCC) No exacerbations reported; patient is on Coreg 6.25 twice daily Lasix; monitor  Hypertensive heart disease with chronic combined systolic and diastolic congestive heart failure (HCC) Controlled; continue Coreg 6.25 mg p.o. twice daily  Hyperlipidemia LDL 154, HDL 46 the patient is on no medication secondary to her age, which is reasonable     Thurston Holenne D. Lyn HollingsheadAlexander, MD

## 2018-03-27 ENCOUNTER — Non-Acute Institutional Stay (SKILLED_NURSING_FACILITY): Payer: Medicare Other

## 2018-03-27 DIAGNOSIS — Z0000 Encounter for general adult medical examination without abnormal findings: Secondary | ICD-10-CM | POA: Diagnosis not present

## 2018-03-27 NOTE — Progress Notes (Signed)
Subjective:   Ariel PrimesLillie Douglas is a 84 y.o. female who presents for Medicare Annual (Subsequent) preventive examination at [facility]  Last AWV-03/26/2017    Objective:     Vitals: BP 138/78 (BP Location: Left Arm, Patient Position: Sitting)   Pulse 75   Temp 98 F (36.7 C) (Oral)   Ht 5\' 3"  (1.6 m)   Wt 123 lb (55.8 kg)   BMI 21.79 kg/m   Body mass index is 21.79 kg/m.  Advanced Directives 03/27/2018 01/29/2018 12/30/2017 11/18/2017 09/22/2017 08/18/2017 07/22/2017  Does Patient Have a Medical Advance Directive? Yes Yes Yes Yes Yes Yes Yes  Type of Advance Directive Out of facility DNR (pink MOST or yellow form) Out of facility DNR (pink MOST or yellow form) Out of facility DNR (pink MOST or yellow form) Out of facility DNR (pink MOST or yellow form) Out of facility DNR (pink MOST or yellow form) Out of facility DNR (pink MOST or yellow form) Out of facility DNR (pink MOST or yellow form)  Does patient want to make changes to medical advance directive? No - Patient declined No - Patient declined No - Patient declined No - Patient declined - - -  Copy of Healthcare Power of Attorney in Chart? - - - - - - -  Would patient like information on creating a medical advance directive? - - - - - - -  Pre-existing out of facility DNR order (yellow form or pink MOST form) Yellow form placed in chart (order not valid for inpatient use) Yellow form placed in chart (order not valid for inpatient use) Yellow form placed in chart (order not valid for inpatient use) Yellow form placed in chart (order not valid for inpatient use) Yellow form placed in chart (order not valid for inpatient use) Yellow form placed in chart (order not valid for inpatient use) Yellow form placed in chart (order not valid for inpatient use)    Tobacco Social History   Tobacco Use  Smoking Status Never Smoker  Smokeless Tobacco Never Used     Counseling given: Not Answered   Clinical Intake:  Pre-visit  preparation completed: No  Pain : No/denies pain     Nutritional Risks: None Diabetes: No  How often do you need to have someone help you when you read instructions, pamphlets, or other written materials from your doctor or pharmacy?: 3 - Sometimes What is the last grade level you completed in school?: 7th grade  Interpreter Needed?: No  Information entered by :: Ariel RussellSara Nima Kemppainen, RN  Past Medical History:  Diagnosis Date  . Acute CVA (cerebrovascular accident) (HCC)   . Acute renal failure (HCC) 05/25/2013  . AKI (acute kidney injury) (HCC)   . CKD (chronic kidney disease) stage 3, GFR 30-59 ml/min (HCC) 05/21/2015  . Closed left hip fracture (HCC)   . Coronary artery disease   . Dementia   . Depression with anxiety   . Dysphagia due to recent stroke 05/31/2015  . Fall   . HLD (hyperlipidemia)   . Hx of arterial ischemic stroke 05/21/2015  . Hyperlipidemia   . Hypertension   . Hypokalemia   . Left displaced femoral neck fracture (HCC) 05/25/2016  . Leukocytosis 07/17/2016  . Protein-calorie malnutrition, severe 05/25/2016  . Syncope 05/24/2013  . Transient ischemic attack (TIA)   . Vitamin D deficiency 05/30/2015   Past Surgical History:  Procedure Laterality Date  . ABDOMINAL HYSTERECTOMY    . ANTERIOR APPROACH HEMI HIP ARTHROPLASTY Left 05/25/2016  Procedure: ANTERIOR APPROACH HEMI HIP ARTHROPLASTY;  Surgeon: Samson Frederic, MD;  Location: [facility];  Service: Orthopedics;  Laterality: Left;   Family History  Problem Relation Age of Onset  . Asthma Other   . Heart disease Other    Social History   Socioeconomic History  . Marital status: Widowed    Spouse name: Not on file  . Number of children: Not on file  . Years of education: Not on file  . Highest education level: Not on file  Occupational History  . Occupation: retired IT trainer  . Financial resource strain: Not hard at all  . Food insecurity:    Worry: Never true    Inability: Never true  .  Transportation needs:    Medical: No    Non-medical: No  Tobacco Use  . Smoking status: Never Smoker  . Smokeless tobacco: Never Used  Substance and Sexual Activity  . Alcohol use: No  . Drug use: No  . Sexual activity: Never  Lifestyle  . Physical activity:    Days per week: 0 days    Minutes per session: 0 min  . Stress: Not at all  Relationships  . Social connections:    Talks on phone: Three times a week    Gets together: Three times a week    Attends religious service: Never    Active member of club or organization: No    Attends meetings of clubs or organizations: Never    Relationship status: Widowed  Other Topics Concern  . Not on file  Social History Narrative   ** Merged History Encounter **      Admitted to [facility] 05/25/2015   Widowed   Never smoked   Alcohol none   DNR           Outpatient Encounter Medications as of 03/27/2018  Medication Sig  . aspirin EC 81 MG EC tablet Take 1 tablet (81 mg total) by mouth 2 (two) times daily with a meal.  . bisacodyl (DULCOLAX) 5 MG EC tablet Take 1 tablet (5 mg total) by mouth daily as needed for moderate constipation.  . carvedilol (COREG) 3.125 MG tablet Take 1 tablet (3.125 mg total) by mouth 2 (two) times daily with a meal.  . divalproex (DEPAKOTE) 125 MG DR tablet Take 125 mg by mouth 2 (two) times daily.   . feeding supplement (BOOST / RESOURCE BREEZE) LIQD Take 1 Container by mouth daily.   . ferrous sulfate 325 (65 FE) MG tablet Take 325 mg by mouth daily with breakfast.   . memantine (NAMENDA) 10 MG tablet Take 10 mg by mouth 2 (two) times daily.  . mirtazapine (REMERON) 15 MG tablet Take 7.5 mg by mouth at bedtime. 1/2 tab  . Multiple Vitamin (MULTIVITAMIN) tablet Take 1 tablet by mouth daily.  Marland Kitchen saccharomyces boulardii (FLORASTOR) 250 MG capsule Take 250 mg by mouth 2 (two) times daily.  . traMADol (ULTRAM) 50 MG tablet Take 50 mg by mouth at bedtime.   No facility-administered encounter  medications on file as of 03/27/2018.     Activities of Daily Living In your present state of health, do you have any difficulty performing the following activities: 03/27/2018  Hearing? N  Vision? N  Difficulty concentrating or making decisions? Y  Walking or climbing stairs? Y  Dressing or bathing? Y  Doing errands, shopping? Y  Preparing Food and eating ? Y  Using the Toilet? Y  In the past  six months, have you accidently leaked urine? N  Do you have problems with loss of bowel control? N  Managing your Medications? Y  Managing your Finances? Y  Housekeeping or managing your Housekeeping? Y  Some recent data might be hidden    Patient Care Team: Margit Hanks, MD as PCP - General (Internal Medicine) [name], MD (Internal Medicine)    Assessment:   This is a routine wellness examination for [place].  Exercise Activities and Dietary recommendations Current Exercise Habits: The patient does not participate in regular exercise at present, Exercise limited by: orthopedic condition(s);neurologic condition(s)  Goals    None      Fall Risk Fall Risk  03/27/2018 03/26/2017  Falls in the past year? No No   Is the patient's home free of loose throw rugs in walkways, pet beds, electrical cords, etc?   yes      Grab bars in the bathroom? yes      Handrails on the stairs?   yes      Adequate lighting?   yes   Depression Screen PHQ 2/9 Scores 03/27/2018 03/26/2017  PHQ - 2 Score 0 0     Cognitive Function     6CIT Screen 03/27/2018 03/26/2017  What Year? 4 points 4 points  What month? 0 points 3 points  What time? 3 points 3 points  Count back from 20 0 points 0 points  Months in reverse 0 points 0 points  Repeat phrase 6 points 6 points  Total Score 13 16    Immunization History  Administered Date(s) Administered  . Influenza,inj,Quad PF,6+ Mos 07/19/2016  . Influenza-Unspecified 06/27/2017  . PPD Test 05/28/2016  . Pneumococcal Conjugate-13 07/11/2017     Qualifies for Shingles Vaccine? Not in past records  Screening Tests Health Maintenance  Topic Date Due  . DEXA SCAN  03/27/2018 (Originally 12/12/1985)  . TETANUS/TDAP  09/03/2023 (Originally 12/13/1937)  . INFLUENZA VACCINE  04/02/2018  . PNA vac Low Risk Adult (2 of 2 - PPSV23) 07/11/2018    Cancer Screenings: Lung: Low Dose CT Chest recommended if Age 28-80 years, 30 pack-year currently smoking OR have quit w/in 15years. Patient does not qualify. Breast:  Up to date on Mammogram? Yes   Up to date of Bone Density/Dexa? Excluded, unambulatory  Colorectal: up to date  Additional Screenings:  Hepatitis C Screening: declined TDAP due: ordered    Plan:    I have personally reviewed and addressed the Medicare Annual Wellness questionnaire and have noted the following in the patient's chart:  A. Medical and social history B. Use of alcohol, tobacco or illicit drugs  C. Current medications and supplements D. Functional ability and status E.  Nutritional status F.  Physical activity G. Advance directives H. List of other physicians I.  Hospitalizations, surgeries, and ER visits in previous 12 months J.  Vitals K. Screenings to include hearing, vision, cognitive, depression L. Referrals and appointments - none  In addition, I have reviewed and discussed with patient certain preventive protocols, quality metrics, and best practice recommendations. A written personalized care plan for preventive services as well as general preventive health recommendations were provided to patient.  See attached scanned questionnaire for additional information.   Signed,   Ariel Russell, RN Nurse Health Advisor  Patient concerns: None

## 2018-03-27 NOTE — Patient Instructions (Signed)
[name] , Thank you for taking time to come for your Medicare Wellness Visit. I appreciate your ongoing commitment to your health goals. Please review the following plan we discussed and let me know if I can assist you in the future.   Screening recommendations/referrals: Colonoscopy excluded, over age 82 Mammogram excluded, over age 82 Bone Density excluded Recommended yearly ophthalmology/optometry visit for glaucoma screening and checkup Recommended yearly dental visit for hygiene and checkup  Vaccinations: Influenza vaccine up to date, due 2019 fall season Pneumococcal vaccine up to date, completed Tdap vaccine due, ordered Shingles vaccine not in past records    Advanced directives: in chart  Conditions/risks identified: none  Next appointment: Dr. Lyn Douglas makes rounds   Preventive Care 65 Years and Older, Female Preventive care refers to lifestyle choices and visits with your health care provider that can promote health and wellness. What does preventive care include?  A yearly physical exam. This is also called an annual well check.  Dental exams once or twice a year.  Routine eye exams. Ask your health care provider how often you should have your eyes checked.  Personal lifestyle choices, including:  Daily care of your teeth and gums.  Regular physical activity.  Eating a healthy diet.  Avoiding tobacco and drug use.  Limiting alcohol use.  Practicing safe sex.  Taking low-dose aspirin every day.  Taking vitamin and mineral supplements as recommended by your health care provider. What happens during an annual well check? The services and screenings done by your health care provider during your annual well check will depend on your age, overall health, lifestyle risk factors, and family history of disease. Counseling  Your health care provider may ask you questions about your:  Alcohol use.  Tobacco use.  Drug use.  Emotional  well-being.  Home and relationship well-being.  Sexual activity.  Eating habits.  History of falls.  Memory and ability to understand (cognition).  Work and work Astronomerenvironment.  Reproductive health. Screening  You may have the following tests or measurements:  Height, weight, and BMI.  Blood pressure.  Lipid and cholesterol levels. These may be checked every 5 years, or more frequently if you are over 10279 years old.  Skin check.  Lung cancer screening. You may have this screening every year starting at age 82 if you have a 30-pack-year history of smoking and currently smoke or have quit within the past 15 years.  Fecal occult blood test (FOBT) of the stool. You may have this test every year starting at age 82.  Flexible sigmoidoscopy or colonoscopy. You may have a sigmoidoscopy every 5 years or a colonoscopy every 10 years starting at age 250.  Hepatitis C blood test.  Hepatitis B blood test.  Sexually transmitted disease (STD) testing.  Diabetes screening. This is done by checking your blood sugar (glucose) after you have not eaten for a while (fasting). You may have this done every 1-3 years.  Bone density scan. This is done to screen for osteoporosis. You may have this done starting at age 82.  Mammogram. This may be done every 1-2 years. Talk to your health care provider about how often you should have regular mammograms. Talk with your health care provider about your test results, treatment options, and if necessary, the need for more tests. Vaccines  Your health care provider may recommend certain vaccines, such as:  Influenza vaccine. This is recommended every year.  Tetanus, diphtheria, and acellular pertussis (Tdap, Td) vaccine. You may need  a Td booster every 10 years.  Zoster vaccine. You may need this after age 75.  Pneumococcal 13-valent conjugate (PCV13) vaccine. One dose is recommended after age 5.  Pneumococcal polysaccharide (PPSV23) vaccine. One  dose is recommended after age 80. Talk to your health care provider about which screenings and vaccines you need and how often you need them. This information is not intended to replace advice given to you by your health care provider. Make sure you discuss any questions you have with your health care provider. Document Released: 09/15/2015 Document Revised: 05/08/2016 Document Reviewed: 06/20/2015 Elsevier Interactive Patient Education  2017 [place] Prevention in the Home Falls can cause injuries. They can happen to people of all ages. There are many things you can do to make your home safe and to help prevent falls. What can I do on the outside of my home?  Regularly fix the edges of walkways and driveways and fix any cracks.  Remove anything that might make you trip as you walk through a door, such as a raised step or threshold.  Trim any bushes or trees on the path to your home.  Use bright outdoor lighting.  Clear any walking paths of anything that might make someone trip, such as rocks or tools.  Regularly check to see if handrails are loose or broken. Make sure that both sides of any steps have handrails.  Any raised decks and porches should have guardrails on the edges.  Have any leaves, snow, or ice cleared regularly.  Use sand or salt on walking paths during winter.  Clean up any spills in your garage right away. This includes oil or grease spills. What can I do in the bathroom?  Use night lights.  Install grab bars by the toilet and in the tub and shower. Do not use towel bars as grab bars.  Use non-skid mats or decals in the tub or shower.  If you need to sit down in the shower, use a plastic, non-slip stool.  Keep the floor dry. Clean up any water that spills on the floor as soon as it happens.  Remove soap buildup in the tub or shower regularly.  Attach bath mats securely with double-sided non-slip rug tape.  Do not have throw rugs and other  things on the floor that can make you trip. What can I do in the bedroom?  Use night lights.  Make sure that you have a light by your bed that is easy to reach.  Do not use any sheets or blankets that are too big for your bed. They should not hang down onto the floor.  Have a firm chair that has side arms. You can use this for support while you get dressed.  Do not have throw rugs and other things on the floor that can make you trip. What can I do in the kitchen?  Clean up any spills right away.  Avoid walking on wet floors.  Keep items that you use a lot in easy-to-reach places.  If you need to reach something above you, use a strong step stool that has a grab bar.  Keep electrical cords out of the way.  Do not use floor polish or wax that makes floors slippery. If you must use wax, use non-skid floor wax.  Do not have throw rugs and other things on the floor that can make you trip. What can I do with my stairs?  Do not leave any items on the  stairs.  Make sure that there are handrails on both sides of the stairs and use them. Fix handrails that are broken or loose. Make sure that handrails are as long as the stairways.  Check any carpeting to make sure that it is firmly attached to the stairs. Fix any carpet that is loose or worn.  Avoid having throw rugs at the top or bottom of the stairs. If you do have throw rugs, attach them to the floor with carpet tape.  Make sure that you have a light switch at the top of the stairs and the bottom of the stairs. If you do not have them, ask someone to add them for you. What else can I do to help prevent falls?  Wear shoes that:  Do not have high heels.  Have rubber bottoms.  Are comfortable and fit you well.  Are closed at the toe. Do not wear sandals.  If you use a stepladder:  Make sure that it is fully opened. Do not climb a closed stepladder.  Make sure that both sides of the stepladder are locked into place.  Ask  someone to hold it for you, if possible.  Clearly mark and make sure that you can see:  Any grab bars or handrails.  First and last steps.  Where the edge of each step is.  Use tools that help you move around (mobility aids) if they are needed. These include:  Canes.  Walkers.  Scooters.  Crutches.  Turn on the lights when you go into a dark area. Replace any light bulbs as soon as they burn out.  Set up your furniture so you have a clear path. Avoid moving your furniture around.  If any of your floors are uneven, fix them.  If there are any pets around you, be aware of where they are.  Review your medicines with your doctor. Some medicines can make you feel dizzy. This can increase your chance of falling. Ask your doctor what other things that you can do to help prevent falls. This information is not intended to replace advice given to you by your health care provider. Make sure you discuss any questions you have with your health care provider. Document Released: 06/15/2009 Document Revised: 01/25/2016 Document Reviewed: 09/23/2014 Elsevier Interactive Patient Education  2017 [employer].

## 2018-04-14 ENCOUNTER — Encounter: Payer: Self-pay | Admitting: Internal Medicine

## 2018-04-14 IMAGING — CR DG CHEST 1V PORT
1 series · 1 of 1 positions shown · non-contrast
Comparison: 05/25/2016

CLINICAL DATA: Leukocytosis, transient confusion and dementia.

EXAM:
PORTABLE CHEST 1 VIEW

[AP]
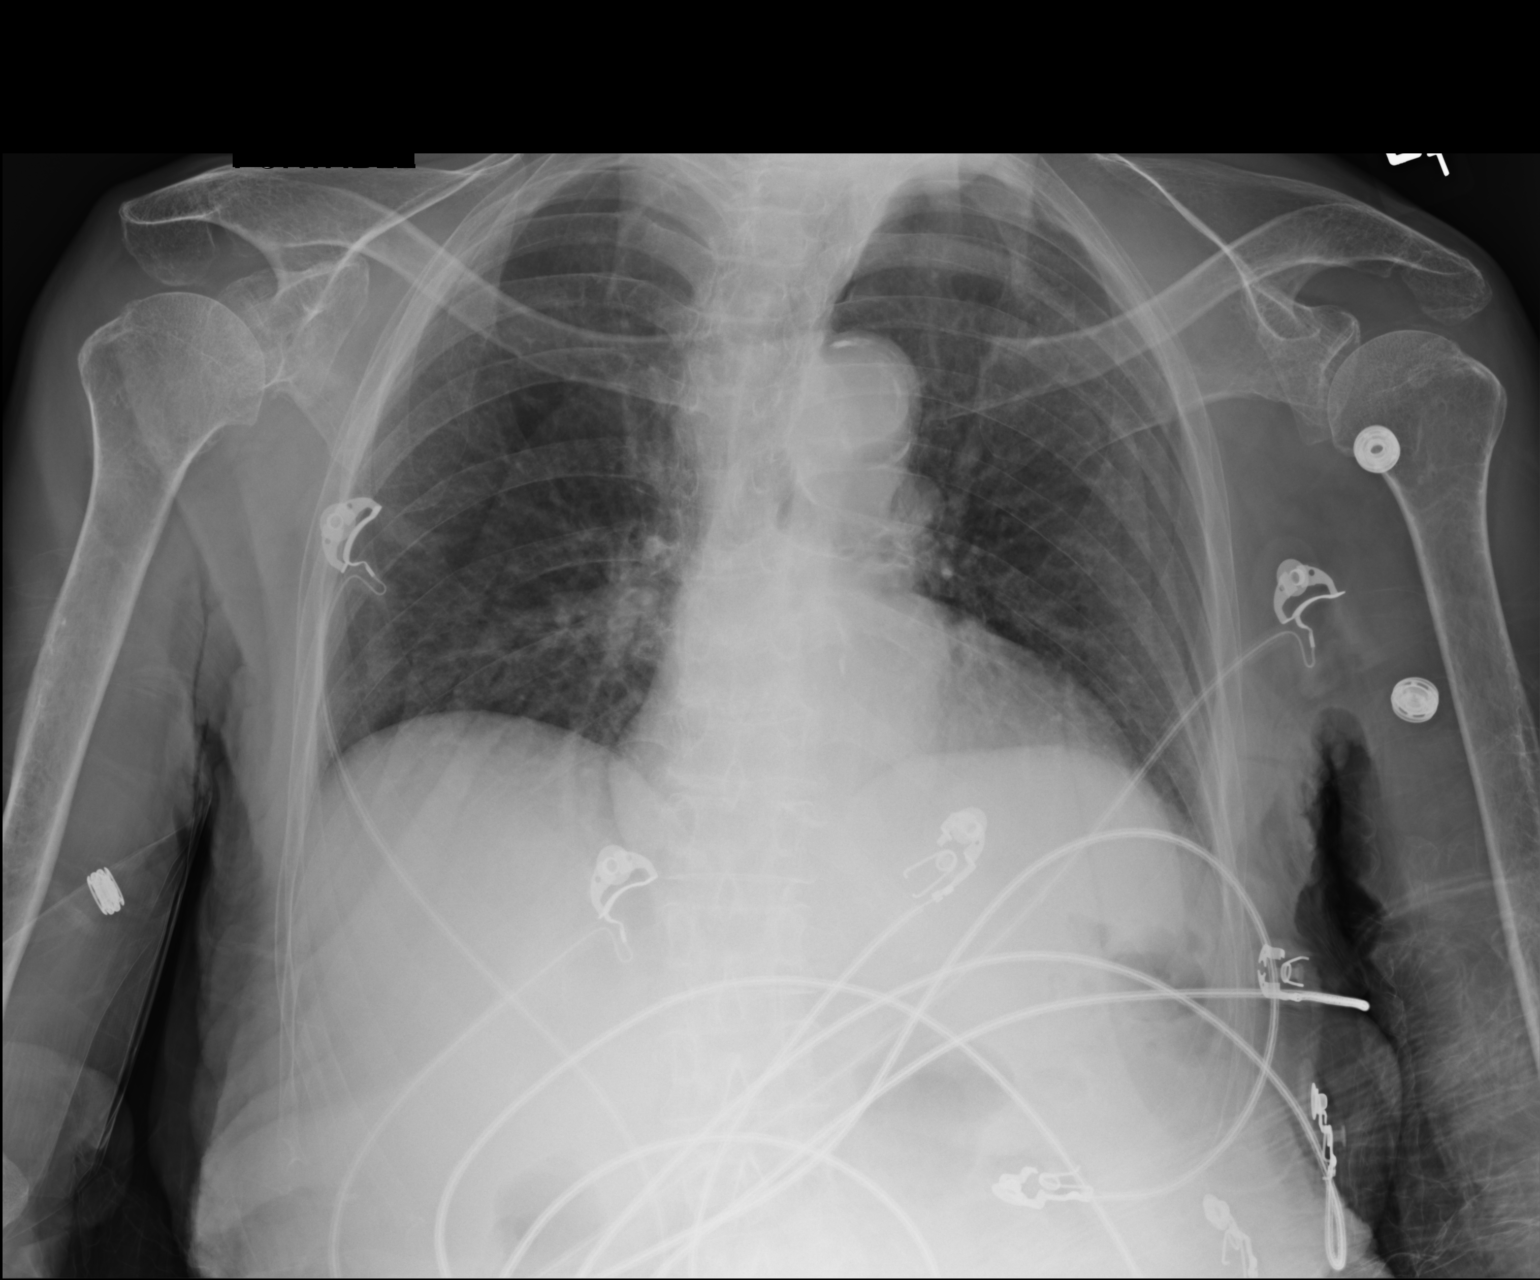

[1 of 1 positions shown; findings below may reference images not displayed]

FINDINGS: There is stable cardiomegaly with aortic atherosclerosis. Lung
volumes are slightly low with crowding of interstitial lung markings
and mild vascular congestion. No pneumonic consolidation, effusion
or pneumothorax. Osteoarthritis of the glenohumeral joints
bilaterally.
IMPRESSION: Stable cardiomegaly with aortic atherosclerosis. Mild pulmonary
vascular congestion without pneumonic consolidations. No acute
osseous abnormality.

## 2018-04-14 NOTE — Assessment & Plan Note (Signed)
No exacerbations reported; patient is on Coreg 6.25 twice daily Lasix; monitor

## 2018-04-14 NOTE — Assessment & Plan Note (Signed)
LDL 154, HDL 46 the patient is on no medication secondary to her age, which is reasonable

## 2018-04-14 NOTE — Assessment & Plan Note (Signed)
Controlled; continue Coreg 6.25 mg p.o. twice daily 

## 2018-04-28 ENCOUNTER — Encounter: Payer: Self-pay | Admitting: Internal Medicine

## 2018-04-28 ENCOUNTER — Non-Acute Institutional Stay (SKILLED_NURSING_FACILITY): Payer: Medicare Other | Admitting: Internal Medicine

## 2018-04-28 DIAGNOSIS — I10 Essential (primary) hypertension: Secondary | ICD-10-CM

## 2018-04-28 DIAGNOSIS — F39 Unspecified mood [affective] disorder: Secondary | ICD-10-CM | POA: Diagnosis not present

## 2018-04-28 DIAGNOSIS — F0280 Dementia in other diseases classified elsewhere without behavioral disturbance: Secondary | ICD-10-CM | POA: Diagnosis not present

## 2018-04-28 DIAGNOSIS — G301 Alzheimer's disease with late onset: Secondary | ICD-10-CM

## 2018-04-28 NOTE — Progress Notes (Signed)
Location:  [facility] Room Number: [phone] Place of Service:  SNF [phone])  Randon Goldsmith. [name], MD  Patient Care Team: Margit Hanks, MD as PCP - General (Internal Medicine) [name], MD (Internal Medicine)  Extended Emergency Contact Information Primary Emergency Contact: [facility] Address: [address]          [place], [place] [address] [place] Home Phone: [phone] Mobile Phone: [phone] Relation: Daughter Secondary Emergency Contact: Asa Sa[place] of [place] Home Phone: [phone] Relation: Granddaughter    Allergies: Penicillins and Penicillins  Chief Complaint  Patient presents with  . Medical Management of Chronic Issues    Routine Visit    HPI: Patient is 84 y.o. female who is being seen for routine issues of hypertension, dementia, and mood disorder.  Past Medical History:  Diagnosis Date  . Acute CVA (cerebrovascular accident) (HCC)   . Acute renal failure (HCC) 05/25/2013  . AKI (acute kidney injury) (HCC)   . CKD (chronic kidney disease) stage 3, GFR 30-59 ml/min (HCC) 05/21/2015  . Closed left hip fracture (HCC)   . Coronary artery disease   . Dementia   . Depression with anxiety   . Dysphagia due to recent stroke 05/31/2015  . Fall   . HLD (hyperlipidemia)   . Hx of arterial ischemic stroke 05/21/2015  . Hyperlipidemia   . Hypertension   . Hypokalemia   . Left displaced femoral neck fracture (HCC) 05/25/2016  . Leukocytosis 07/17/2016  . Protein-calorie malnutrition, severe 05/25/2016  . Syncope 05/24/2013  . Transient ischemic attack (TIA)   . Vitamin D deficiency 05/30/2015    Past Surgical History:  Procedure Laterality Date  . ABDOMINAL HYSTERECTOMY    . ANTERIOR APPROACH HEMI HIP ARTHROPLASTY Left 05/25/2016   Procedure: ANTERIOR APPROACH HEMI HIP ARTHROPLASTY;  Surgeon: Samson Frederic, MD;  Location: [facility];  Service: Orthopedics;  Laterality: Left;      Allergies as of 04/28/2018      Reactions   Penicillins Swelling   Facial and hand swelling per daughter Has patient had a PCN reaction causing immediate rash, facial/tongue/throat swelling, SOB or lightheadedness with hypotension: Yes Has patient had a PCN reaction causing severe rash involving mucus membranes or skin necrosis: No Has patient had a PCN reaction that required hospitalization No Has patient had a PCN reaction occurring within the last 10 years: No If all of the above answers are "NO", then may proceed with Cephalosporin use.   Penicillins Swelling   Has patient had a PCN reaction causing immediate rash, facial/tongue/throat swelling, SOB or lightheadedness with hypotension: Yes Has patient had a PCN reaction causing severe rash involving mucus membranes or skin necrosis: No Has patient had a PCN reaction that required hospitalization No Has patient had a PCN reaction occurring within the last 10 years: No If all of the above answers are "NO", then may proceed with Cephalosporin use.      Medication List        Accurate as of 04/28/18 11:59 PM. Always use your most recent med list.          aspirin 81 MG EC tablet Take 1 tablet (81 mg total) by mouth 2 (two) times daily with a meal.   bisacodyl 5 MG EC tablet Commonly known as:  DULCOLAX Take 1 tablet (5 mg total) by mouth daily as needed for moderate constipation.   carvedilol 3.125 MG tablet Commonly known as:  COREG Take 1  tablet (3.125 mg total) by mouth 2 (two) times daily with a meal.   divalproex 125 MG DR tablet Commonly known as:  DEPAKOTE Take 125 mg by mouth 2 (two) times daily.   feeding supplement Liqd Take 1 Container by mouth daily.   ferrous sulfate 325 (65 FE) MG tablet Take 325 mg by mouth daily with breakfast.   memantine 10 MG tablet Commonly known as:  NAMENDA Take 10 mg by mouth 2 (two) times daily.   mirtazapine 15 MG tablet Commonly known as:  REMERON Take 7.5 mg by mouth  at bedtime. 1/2 tab   multivitamin tablet Take 1 tablet by mouth daily.   saccharomyces boulardii 250 MG capsule Commonly known as:  FLORASTOR Take 250 mg by mouth 2 (two) times daily.   traMADol 50 MG tablet Commonly known as:  ULTRAM Take 50 mg by mouth at bedtime.       No orders of the defined types were placed in this encounter.   Immunization History  Administered Date(s) Administered  . Influenza,inj,Quad PF,6+ Mos 07/19/2016  . Influenza-Unspecified 06/27/2017  . PPD Test 05/28/2016  . Pneumococcal Conjugate-13 07/11/2017    Social History   Tobacco Use  . Smoking status: Never Smoker  . Smokeless tobacco: Never Used  Substance Use Topics  . Alcohol use: No    Review of Systems  DATA OBTAINED: from patient-limited; nursing- no acute concerns GENERAL:  no fevers, fatigue, appetite changes SKIN: No itching, rash HEENT: No complaint RESPIRATORY: No cough, wheezing, SOB CARDIAC: No chest pain, palpitations, lower extremity edema  GI: No abdominal pain, No N/V/D or constipation, No heartburn or reflux  GU: No dysuria, frequency or urgency, or incontinence  MUSCULOSKELETAL: No unrelieved bone/joint pain NEUROLOGIC: No headache, dizziness  PSYCHIATRIC: No overt anxiety or sadness  Vitals:   04/28/18 1141  BP: 117/69  Pulse: 74  Resp: 20  Temp: 98.3 F (36.8 C)   Body mass index is 21.97 kg/m. Physical Exam  GENERAL APPEARANCE: Alert, conversant, No acute distress  SKIN: No diaphoresis rash HEENT: Unremarkable RESPIRATORY: Breathing is even, unlabored. Lung sounds are clear   CARDIOVASCULAR: Heart RRR no murmurs, rubs or gallops. No peripheral edema  GASTROINTESTINAL: Abdomen is soft, non-tender, not distended w/ normal bowel sounds.  GENITOURINARY: Bladder non tender, not distended  MUSCULOSKELETAL: No abnormal joints or musculature NEUROLOGIC: Cranial nerves 2-12 grossly intact. Moves all extremities PSYCHIATRIC: Mood and affect with dementia,  no behavioral issues  Patient Active Problem List   Diagnosis Date Noted  . Anxiety 07/25/2017  . 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 FTT (failure to thrive) in adult 08/04/2016  . Mood disorder (HCC) 07/28/2016  . Leukocytosis 07/17/2016  . Pressure injury of skin 07/17/2016  . Malnutrition of moderate degree 07/17/2016  . C. difficile diarrhea 07/07/2016  . Pedal edema 06/27/2016  . Postoperative anemia due to acute blood loss 06/10/2016  . Newly recognized heart murmur 06/10/2016  . Acute renal failure superimposed on stage 3 chronic kidney disease (HCC) 06/10/2016  . Chronic combined systolic and diastolic CHF (congestive heart failure) (HCC) 05/28/2016  . Protein-calorie malnutrition, severe 05/25/2016  . Left displaced femoral neck fracture (HCC) 05/25/2016  . Fall 05/24/2016  . Hypokalemia 05/24/2016  . AKI (acute kidney injury) (HCC) 05/24/2016  . Hypertensive heart disease with chronic combined systolic and diastolic congestive heart failure (HCC)   . Dementia   . Depression with anxiety   . HLD (hyperlipidemia)   . Dysphagia due to recent stroke 05/31/2015  .  Hyperlipidemia 05/30/2015  . Vitamin D deficiency 05/30/2015  . Acute CVA (cerebrovascular accident) (HCC)   . Hx of arterial ischemic stroke 05/21/2015  . CKD (chronic kidney disease) stage 3, GFR 30-59 ml/min (HCC) 05/21/2015  . Dementia 05/21/2015  . Hypertension 05/21/2015  . UTI (urinary tract infection) 05/25/2013  . Acute renal failure (HCC) 05/25/2013  . Syncope 05/24/2013    CMP     Component Value Date/Time   NA 144 03/20/2018   K 4.8 03/20/2018   CL 105 01/03/2017 1526   CO2 23 01/03/2017 1526   GLUCOSE 103 (H) 01/03/2017 1526   BUN 27 (A) 03/20/2018   CREATININE 1.3 (A) 03/20/2018   CREATININE 1.55 (H) 01/03/2017 1526   CALCIUM 9.2 01/03/2017 1526   PROT 6.9 01/03/2017 1526   ALBUMIN 3.2 (L) 01/03/2017 1526   AST 19 03/20/2018   ALT 16 03/20/2018   ALKPHOS 85 03/20/2018    BILITOT 1.1 01/03/2017 1526   GFRNONAA 27 (L) 01/03/2017 1526   GFRAA 31 (L) 01/03/2017 1526   Recent Labs    07/29/17 03/20/18  NA 144 144  K 4.6 4.8  BUN 27* 27*  CREATININE 1.5* 1.3*   Recent Labs    07/29/17 03/20/18  AST 12* 19  ALT 5* 16  ALKPHOS 91 85   Recent Labs    07/29/17 03/20/18  WBC 5.6 5.2  HGB 11.8* 11.8*  HCT 37 36  PLT 187 220   Recent Labs    07/29/17  CHOL 219*  LDLCALC 154  TRIG 93   No results found for: MICROALBUR Lab Results  Component Value Date   TSH 1.15 07/29/2017   Lab Results  Component Value Date   HGBA1C 5.9 07/29/2017   Lab Results  Component Value Date   CHOL 219 (A) 07/29/2017   HDL 46 07/29/2017   LDLCALC 154 07/29/2017   TRIG 93 07/29/2017   CHOLHDL 5.4 05/22/2015    Significant Diagnostic Results in last 30 days:  No results found.  Assessment and Plan  Hypertension Controlled; continue Coreg 3.125 mg twice daily  Dementia Stable; continue Namenda 10 mg twice daily  Mood disorder (HCC) Continue stable on Depakote 125 mg twice daily; continue current regimen     Dyon Rotert D. [name], MD

## 2018-05-02 ENCOUNTER — Encounter: Payer: Self-pay | Admitting: Internal Medicine

## 2018-05-02 NOTE — Assessment & Plan Note (Signed)
Stable; continue Namenda 10 mg twice daily 

## 2018-05-02 NOTE — Assessment & Plan Note (Signed)
Controlled; continue Coreg 3.125 mg twice daily

## 2018-05-02 NOTE — Assessment & Plan Note (Signed)
Continue stable on Depakote 125 mg twice daily; continue current regimen

## 2018-05-29 ENCOUNTER — Non-Acute Institutional Stay (SKILLED_NURSING_FACILITY): Payer: Medicare Other | Admitting: Internal Medicine

## 2018-05-29 ENCOUNTER — Encounter: Payer: Self-pay | Admitting: Internal Medicine

## 2018-05-29 DIAGNOSIS — I5042 Chronic combined systolic (congestive) and diastolic (congestive) heart failure: Secondary | ICD-10-CM

## 2018-05-29 DIAGNOSIS — I110 Hypertensive heart disease with heart failure: Secondary | ICD-10-CM

## 2018-05-29 DIAGNOSIS — Z8673 Personal history of transient ischemic attack (TIA), and cerebral infarction without residual deficits: Secondary | ICD-10-CM | POA: Diagnosis not present

## 2018-05-29 NOTE — Progress Notes (Signed)
Location:  [facility] Room Number: [phone] Place of Service:  SNF [phone])  Randon Goldsmith. [name], MD  Patient Care Team: Margit Hanks, MD as PCP - General (Internal Medicine) [name], MD (Internal Medicine)  Extended Emergency Contact Information Primary Emergency Contact: [facility] Address: [address]          [place], [place] [address] D[place] of [place] Home Phone: [phone] Mobile Phone: [phone] Relation: Daughter Secondary Emergency Contact: Asa Saunas [place] of [place] Home Phone: [phone] Relation: Granddaughter    Allergies: Penicillins and Penicillins  Chief Complaint  Patient presents with  . Medical Management of Chronic Issues    Routine Visit    HPI: Patient is 84 y.o. female who is being seen for routine issues of history of arterial ischemic CVA, combined systolic and diastolic congestive heart failure, and hypertension.  Past Medical History:  Diagnosis Date  . Acute CVA (cerebrovascular accident) (HCC)   . Acute renal failure (HCC) 05/25/2013  . AKI (acute kidney injury) (HCC)   . CKD (chronic kidney disease) stage 3, GFR 30-59 ml/min (HCC) 05/21/2015  . Closed left hip fracture (HCC)   . Coronary artery disease   . Dementia   . Depression with anxiety   . Dysphagia due to recent stroke 05/31/2015  . Fall   . HLD (hyperlipidemia)   . Hx of arterial ischemic stroke 05/21/2015  . Hyperlipidemia   . Hypertension   . Hypokalemia   . Left displaced femoral neck fracture (HCC) 05/25/2016  . Leukocytosis 07/17/2016  . Protein-calorie malnutrition, severe 05/25/2016  . Syncope 05/24/2013  . Transient ischemic attack (TIA)   . Vitamin D deficiency 05/30/2015    Past Surgical History:  Procedure Laterality Date  . ABDOMINAL HYSTERECTOMY    . ANTERIOR APPROACH HEMI HIP ARTHROPLASTY Left 05/25/2016   Procedure: ANTERIOR APPROACH HEMI HIP ARTHROPLASTY;  Surgeon: Samson Frederic,  MD;  Location: [facility];  Service: Orthopedics;  Laterality: Left;    Allergies as of 05/29/2018      Reactions   Penicillins Swelling   Facial and hand swelling per daughter Has patient had a PCN reaction causing immediate rash, facial/tongue/throat swelling, SOB or lightheadedness with hypotension: Yes Has patient had a PCN reaction causing severe rash involving mucus membranes or skin necrosis: No Has patient had a PCN reaction that required hospitalization No Has patient had a PCN reaction occurring within the last 10 years: No If all of the above answers are "NO", then may proceed with Cephalosporin use.   Penicillins Swelling   Has patient had a PCN reaction causing immediate rash, facial/tongue/throat swelling, SOB or lightheadedness with hypotension: Yes Has patient had a PCN reaction causing severe rash involving mucus membranes or skin necrosis: No Has patient had a PCN reaction that required hospitalization No Has patient had a PCN reaction occurring within the last 10 years: No If all of the above answers are "NO", then may proceed with Cephalosporin use.      Medication List        Accurate as of 05/29/18 11:59 PM. Always use your most recent med list.          aspirin 81 MG EC tablet Take 1 tablet (81 mg total) by mouth 2 (two) times daily with a meal.   bisacodyl 5 MG EC tablet Commonly known as:  DULCOLAX Take 1 tablet (5 mg total) by mouth daily as needed for moderate constipation.   carvedilol 3.125 MG  tablet Commonly known as:  COREG Take 1 tablet (3.125 mg total) by mouth 2 (two) times daily with a meal.   divalproex 125 MG DR tablet Commonly known as:  DEPAKOTE Take 125 mg by mouth 2 (two) times daily.   feeding supplement Liqd Take 1 Container by mouth daily.   ferrous sulfate 325 (65 FE) MG tablet Take 325 mg by mouth daily with breakfast.   memantine 10 MG tablet Commonly known as:  NAMENDA Take 10 mg by mouth 2 (two) times daily.   mirtazapine  15 MG tablet Commonly known as:  REMERON Take 7.5 mg by mouth at bedtime. 1/2 tab   multivitamin tablet Take 1 tablet by mouth daily.   saccharomyces boulardii 250 MG capsule Commonly known as:  FLORASTOR Take 250 mg by mouth 2 (two) times daily.   traMADol 50 MG tablet Commonly known as:  ULTRAM Take 50 mg by mouth at bedtime.       No orders of the defined types were placed in this encounter.   Immunization History  Administered Date(s) Administered  . Influenza,inj,Quad PF,6+ Mos 07/19/2016  . Influenza-Unspecified 06/27/2017  . PPD Test 05/28/2016  . Pneumococcal Conjugate-13 07/11/2017    Social History   Tobacco Use  . Smoking status: Never Smoker  . Smokeless tobacco: Never Used  Substance Use Topics  . Alcohol use: No    Review of Systems  DATA OBTAINED: from patient-limited; nursing no acute concerns GENERAL:  no fevers, fatigue, appetite changes SKIN: No itching, rash HEENT: No complaint RESPIRATORY: No cough, wheezing, SOB CARDIAC: No chest pain, palpitations, lower extremity edema  GI: No abdominal pain, No N/V/D or constipation, No heartburn or reflux  GU: No dysuria, frequency or urgency, or incontinence  MUSCULOSKELETAL: No unrelieved bone/joint pain NEUROLOGIC: No headache, dizziness  PSYCHIATRIC: No overt anxiety or sadness  Vitals:   05/29/18 1107  BP: 113/81  Pulse: 63  Resp: 17  Temp: (!) 97.5 F (36.4 C)   Body mass index is 21.97 kg/m. Physical Exam  GENERAL APPEARANCE: Alert, conversant, No acute distress  SKIN: No diaphoresis rash HEENT: Unremarkable RESPIRATORY: Breathing is even, unlabored. Lung sounds are clear   CARDIOVASCULAR: Heart RRR no murmurs, rubs or gallops. No peripheral edema  GASTROINTESTINAL: Abdomen is soft, non-tender, not distended w/ normal bowel sounds.  GENITOURINARY: Bladder non tender, not distended  MUSCULOSKELETAL: No abnormal joints or musculature NEUROLOGIC: Cranial nerves 2-12 grossly intact.  Moves all extremities PSYCHIATRIC: Mood and affect dementia, no behavioral issues  Patient Active Problem List   Diagnosis Date Noted  . Anxiety 07/25/2017  . 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 FTT (failure to thrive) in adult 08/04/2016  . Mood disorder (HCC) 07/28/2016  . Leukocytosis 07/17/2016  . Pressure injury of skin 07/17/2016  . Malnutrition of moderate degree 07/17/2016  . C. difficile diarrhea 07/07/2016  . Pedal edema 06/27/2016  . Postoperative anemia due to acute blood loss 06/10/2016  . Newly recognized heart murmur 06/10/2016  . Acute renal failure superimposed on stage 3 chronic kidney disease (HCC) 06/10/2016  . Chronic combined systolic and diastolic CHF (congestive heart failure) (HCC) 05/28/2016  . Protein-calorie malnutrition, severe 05/25/2016  . Left displaced femoral neck fracture (HCC) 05/25/2016  . Fall 05/24/2016  . Hypokalemia 05/24/2016  . AKI (acute kidney injury) (HCC) 05/24/2016  . Hypertensive heart disease with chronic combined systolic and diastolic congestive heart failure (HCC)   . Dementia   . Depression with anxiety   . HLD (hyperlipidemia)   .  Dysphagia due to recent stroke 05/31/2015  . Hyperlipidemia 05/30/2015  . Vitamin D deficiency 05/30/2015  . Acute CVA (cerebrovascular accident) (HCC)   . Hx of arterial ischemic stroke 05/21/2015  . CKD (chronic kidney disease) stage 3, GFR 30-59 ml/min (HCC) 05/21/2015  . Dementia 05/21/2015  . Hypertension 05/21/2015  . UTI (urinary tract infection) 05/25/2013  . Acute renal failure (HCC) 05/25/2013  . Syncope 05/24/2013    CMP     Component Value Date/Time   NA 144 03/20/2018   K 4.8 03/20/2018   CL 105 01/03/2017 1526   CO2 23 01/03/2017 1526   GLUCOSE 103 (H) 01/03/2017 1526   BUN 27 (A) 03/20/2018   CREATININE 1.3 (A) 03/20/2018   CREATININE 1.55 (H) 01/03/2017 1526   CALCIUM 9.2 01/03/2017 1526   PROT 6.9 01/03/2017 1526   ALBUMIN 3.2 (L) 01/03/2017 1526   AST 19  03/20/2018   ALT 16 03/20/2018   ALKPHOS 85 03/20/2018   BILITOT 1.1 01/03/2017 1526   GFRNONAA 27 (L) 01/03/2017 1526   GFRAA 31 (L) 01/03/2017 1526   Recent Labs    07/29/17 03/20/18  NA 144 144  K 4.6 4.8  BUN 27* 27*  CREATININE 1.5* 1.3*   Recent Labs    07/29/17 03/20/18  AST 12* 19  ALT 5* 16  ALKPHOS 91 85   Recent Labs    07/29/17 03/20/18  WBC 5.6 5.2  HGB 11.8* 11.8*  HCT 37 36  PLT 187 220   Recent Labs    07/29/17  CHOL 219*  LDLCALC 154  TRIG 93   No results found for: MICROALBUR Lab Results  Component Value Date   TSH 1.15 07/29/2017   Lab Results  Component Value Date   HGBA1C 5.9 07/29/2017   Lab Results  Component Value Date   CHOL 219 (A) 07/29/2017   HDL 46 07/29/2017   LDLCALC 154 07/29/2017   TRIG 93 07/29/2017   CHOLHDL 5.4 05/22/2015    Significant Diagnostic Results in last 30 days:  No results found.  Assessment and Plan  Hx of arterial ischemic stroke Stable; continue ASA 81 mg twice daily  Chronic combined systolic and diastolic CHF (congestive heart failure) (HCC) No reported exacerbation; continue 3.125 mg twice daily; patient is not on Lasix; continue to monitor  Hypertensive heart disease with chronic combined systolic and diastolic congestive heart failure (HCC) Controlled on Coreg 3.125 mg twice daily; continue current regimen    Thurston Hole D. [name], MD

## 2018-05-31 ENCOUNTER — Encounter: Payer: Self-pay | Admitting: Internal Medicine

## 2018-05-31 NOTE — Assessment & Plan Note (Signed)
Controlled on Coreg 3.125 mg twice daily; continue current regimen

## 2018-05-31 NOTE — Assessment & Plan Note (Signed)
Stable; continue ASA 81 mg twice daily 

## 2018-05-31 NOTE — Assessment & Plan Note (Signed)
No reported exacerbation; continue 3.125 mg twice daily; patient is not on Lasix; continue to monitor

## 2018-06-16 LAB — BASIC METABOLIC PANEL
BUN: 26 — AB (ref 4–21)
CREATININE: 1.5 — AB (ref 0.5–1.1)
Glucose: 76
Potassium: 5.1 (ref 3.4–5.3)
Sodium: 147 (ref 137–147)

## 2018-06-16 LAB — CBC AND DIFFERENTIAL
HCT: 35 — AB (ref 36–46)
HEMATOCRIT: 35 — AB (ref 36–46)
Hemoglobin: 11.7 — AB (ref 12.0–16.0)
Hemoglobin: 11.7 — AB (ref 12.0–16.0)
Platelets: 201 (ref 150–399)
Platelets: 201 (ref 150–399)
WBC: 6.8
WBC: 6.8

## 2018-06-16 LAB — HEMOGLOBIN A1C: HEMOGLOBIN A1C: 5.9

## 2018-06-16 LAB — LIPID PANEL
CHOLESTEROL: 191 (ref 0–200)
HDL: 43 (ref 35–70)
LDL CALC: 132
LDl/HDL Ratio: 4.4
Triglycerides: 79 (ref 40–160)

## 2018-06-16 LAB — HEPATIC FUNCTION PANEL
ALK PHOS: 74 (ref 25–125)
ALT: 7 (ref 7–35)
AST: 16 (ref 13–35)
BILIRUBIN, TOTAL: 0.3

## 2018-06-16 LAB — TSH: TSH: 2.06 (ref 0.41–5.90)

## 2018-06-16 LAB — VITAMIN D 25 HYDROXY (VIT D DEFICIENCY, FRACTURES): Vit D, 25-Hydroxy: 38.89

## 2018-06-29 ENCOUNTER — Non-Acute Institutional Stay (SKILLED_NURSING_FACILITY): Payer: Medicare Other | Admitting: Internal Medicine

## 2018-06-29 ENCOUNTER — Encounter: Payer: Self-pay | Admitting: Internal Medicine

## 2018-06-29 DIAGNOSIS — R627 Adult failure to thrive: Secondary | ICD-10-CM | POA: Diagnosis not present

## 2018-06-29 DIAGNOSIS — G301 Alzheimer's disease with late onset: Secondary | ICD-10-CM | POA: Diagnosis not present

## 2018-06-29 DIAGNOSIS — F0281 Dementia in other diseases classified elsewhere with behavioral disturbance: Secondary | ICD-10-CM

## 2018-06-29 DIAGNOSIS — F02818 Dementia in other diseases classified elsewhere, unspecified severity, with other behavioral disturbance: Secondary | ICD-10-CM

## 2018-06-29 DIAGNOSIS — E785 Hyperlipidemia, unspecified: Secondary | ICD-10-CM

## 2018-06-29 NOTE — Progress Notes (Signed)
Location:  [facility] Room Number: [phone] Place of Service:  SNF [phone])  Ariel Douglas. [name], MD  Patient Care Team: Margit Hanks, MD as PCP - General (Internal Medicine) [name], MD (Internal Medicine)  Extended Emergency Contact Information Primary Emergency Contact: [facility] Address: [address]          [place], [place] [address] [place] of [place] Home Phone: [phone] Mobile Phone: [phone] Relation: Daughter Secondary Emergency Contact: Asa Saun[place] Home Phone: [phone] Relation: Granddaughter    Allergies: Penicillins and Penicillins  Chief Complaint  Patient presents with  . Medical Management of Chronic Issues    Routine Visit     HPI: Patient is 84 y.o. female who is being seen for routine issues of failure to thrive, hyperlipidemia, and dementia with behaviors.  Past Medical History:  Diagnosis Date  . Acute CVA (cerebrovascular accident) (HCC)   . Acute renal failure (HCC) 05/25/2013  . AKI (acute kidney injury) (HCC)   . CKD (chronic kidney disease) stage 3, GFR 30-59 ml/min (HCC) 05/21/2015  . Closed left hip fracture (HCC)   . Coronary artery disease   . Dementia (HCC)   . Depression with anxiety   . Dysphagia due to recent stroke 05/31/2015  . Fall   . HLD (hyperlipidemia)   . Hx of arterial ischemic stroke 05/21/2015  . Hyperlipidemia   . Hypertension   . Hypokalemia   . Left displaced femoral neck fracture (HCC) 05/25/2016  . Leukocytosis 07/17/2016  . Protein-calorie malnutrition, severe 05/25/2016  . Syncope 05/24/2013  . Transient ischemic attack (TIA)   . Vitamin D deficiency 05/30/2015    Past Surgical History:  Procedure Laterality Date  . ABDOMINAL HYSTERECTOMY    . ANTERIOR APPROACH HEMI HIP ARTHROPLASTY Left 05/25/2016   Procedure: ANTERIOR APPROACH HEMI HIP ARTHROPLASTY;  Surgeon: Samson Frederic, MD;  Location: [facility];  Service:  Orthopedics;  Laterality: Left;    Allergies as of 06/29/2018      Reactions   Penicillins Swelling   Facial and hand swelling per daughter Has patient had a PCN reaction causing immediate rash, facial/tongue/throat swelling, SOB or lightheadedness with hypotension: Yes Has patient had a PCN reaction causing severe rash involving mucus membranes or skin necrosis: No Has patient had a PCN reaction that required hospitalization No Has patient had a PCN reaction occurring within the last 10 years: No If all of the above answers are "NO", then may proceed with Cephalosporin use.   Penicillins Swelling   Has patient had a PCN reaction causing immediate rash, facial/tongue/throat swelling, SOB or lightheadedness with hypotension: Yes Has patient had a PCN reaction causing severe rash involving mucus membranes or skin necrosis: No Has patient had a PCN reaction that required hospitalization No Has patient had a PCN reaction occurring within the last 10 years: No If all of the above answers are "NO", then may proceed with Cephalosporin use.      Medication List        Accurate as of 06/29/18 11:59 PM. Always use your most recent med list.          aspirin 81 MG EC tablet Take 1 tablet (81 mg total) by mouth 2 (two) times daily with a meal.   bisacodyl 5 MG EC tablet Commonly known as:  DULCOLAX Take 1 tablet (5 mg total) by mouth daily as needed for moderate constipation.   carvedilol 3.125 MG tablet Commonly known as:  COREG Take 1 tablet (3.125 mg total) by mouth 2 (two) times daily with a meal.   divalproex 125 MG DR tablet Commonly known as:  DEPAKOTE Take 125 mg by mouth 2 (two) times daily.   feeding supplement Liqd Take 1 Container by mouth daily.   ferrous sulfate 325 (65 FE) MG tablet Take 325 mg by mouth daily with breakfast.   memantine 10 MG tablet Commonly known as:  NAMENDA Take 10 mg by mouth 2 (two) times daily.   mirtazapine 15 MG tablet Commonly known  as:  REMERON Take 7.5 mg by mouth at bedtime. 1/2 tab   multivitamin tablet Take 1 tablet by mouth daily.   saccharomyces boulardii 250 MG capsule Commonly known as:  FLORASTOR Take 250 mg by mouth 2 (two) times daily.   traMADol 50 MG tablet Commonly known as:  ULTRAM Take 50 mg by mouth at bedtime.       No orders of the defined types were placed in this encounter.   Immunization History  Administered Date(s) Administered  . Influenza,inj,Quad PF,6+ Mos 07/19/2016  . Influenza-Unspecified 06/27/2017  . PPD Test 05/28/2016  . Pneumococcal Conjugate-13 07/11/2017    Social History   Tobacco Use  . Smoking status: Never Smoker  . Smokeless tobacco: Never Used  Substance Use Topics  . Alcohol use: No    Review of Systems  DATA OBTAINED: from patient-limited; nursing-no acute concerns except for very poor appetite GENERAL:  no fevers, fatigue, appetite changes SKIN: No itching, rash HEENT: No complaint RESPIRATORY: No cough, wheezing, SOB CARDIAC: No chest pain, palpitations, lower extremity edema  GI: No abdominal pain, No N/V/D or constipation, No heartburn or reflux  GU: No dysuria, frequency or urgency, or incontinence  MUSCULOSKELETAL: No unrelieved bone/joint pain NEUROLOGIC: No headache, dizziness  PSYCHIATRIC: No overt anxiety or sadness  Vitals:   06/29/18 1129  BP: 102/81  Pulse: 89  Resp: 17  Temp: 98.1 F (36.7 C)   Body mass index is 21.86 kg/m. Physical Exam  GENERAL APPEARANCE: Alert, conversant, No acute distress  SKIN: No diaphoresis rash HEENT: Unremarkable RESPIRATORY: Breathing is even, unlabored. Lung sounds are clear   CARDIOVASCULAR: Heart RRR no murmurs, rubs or gallops. No peripheral edema  GASTROINTESTINAL: Abdomen is soft, non-tender, not distended w/ normal bowel sounds.  GENITOURINARY: Bladder non tender, not distended  MUSCULOSKELETAL: No abnormal joints or musculature said very thin NEUROLOGIC: Cranial nerves 2-12  grossly intact. Moves all extremities PSYCHIATRIC: Mood and affect dementia, no behavioral issues  Patient Active Problem List   Diagnosis Date Noted  . Anxiety 07/25/2017  . 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 FTT (failure to thrive) in adult 08/04/2016  . Mood disorder (HCC) 07/28/2016  . Leukocytosis 07/17/2016  . Pressure injury of skin 07/17/2016  . Malnutrition of moderate degree 07/17/2016  . C. difficile diarrhea 07/07/2016  . Pedal edema 06/27/2016  . Postoperative anemia due to acute blood loss 06/10/2016  . Newly recognized heart murmur 06/10/2016  . Acute renal failure superimposed on stage 3 chronic kidney disease (HCC) 06/10/2016  . Chronic combined systolic and diastolic CHF (congestive heart failure) (HCC) 05/28/2016  . Protein-calorie malnutrition, severe 05/25/2016  . Left displaced femoral neck fracture (HCC) 05/25/2016  . Fall 05/24/2016  . Hypokalemia 05/24/2016  . AKI (acute kidney injury) (HCC) 05/24/2016  . Hypertensive heart disease with chronic combined systolic and diastolic congestive heart failure (HCC)   . Dementia with behavioral problem (HCC)   . Depression with anxiety   .  HLD (hyperlipidemia)   . Dysphagia due to recent stroke 05/31/2015  . Hyperlipidemia 05/30/2015  . Vitamin D deficiency 05/30/2015  . Acute CVA (cerebrovascular accident) (HCC)   . Hx of arterial ischemic stroke 05/21/2015  . CKD (chronic kidney disease) stage 3, GFR 30-59 ml/min (HCC) 05/21/2015  . Dementia (HCC) 05/21/2015  . Hypertension 05/21/2015  . UTI (urinary tract infection) 05/25/2013  . Acute renal failure (HCC) 05/25/2013  . Syncope 05/24/2013    CMP     Component Value Date/Time   NA 147 06/16/2018   K 5.1 06/16/2018   CL 105 01/03/2017 1526   CO2 23 01/03/2017 1526   GLUCOSE 103 (H) 01/03/2017 1526   BUN 26 (A) 06/16/2018   CREATININE 1.5 (A) 06/16/2018   CREATININE 1.55 (H) 01/03/2017 1526   CALCIUM 9.2 01/03/2017 1526   PROT 6.9 01/03/2017  1526   ALBUMIN 3.2 (L) 01/03/2017 1526   AST 16 06/16/2018   ALT 7 06/16/2018   ALKPHOS 74 06/16/2018   BILITOT 1.1 01/03/2017 1526   GFRNONAA 27 (L) 01/03/2017 1526   GFRAA 31 (L) 01/03/2017 1526   Recent Labs    07/29/17 03/20/18 06/16/18  NA 144 144 147  K 4.6 4.8 5.1  BUN 27* 27* 26*  CREATININE 1.5* 1.3* 1.5*   Recent Labs    07/29/17 03/20/18 06/16/18  AST 12* 19 16  ALT 5* 16 7  ALKPHOS 91 85 74   Recent Labs    07/29/17 03/20/18 06/16/18  WBC 5.6 5.2 6.8  6.8  HGB 11.8* 11.8* 11.7*  11.7*  HCT 37 36 35*  35*  PLT 187 220 201  201   Recent Labs    07/29/17 06/16/18  CHOL 219* 191  LDLCALC 154 132  TRIG 93 79   No results found for: [facility] Lab Results  Component Value Date   TSH 2.06 06/16/2018   Lab Results  Component Value Date   HGBA1C 5.9 06/16/2018   Lab Results  Component Value Date   CHOL 191 06/16/2018   HDL 43 06/16/2018   LDLCALC 132 06/16/2018   TRIG 79 06/16/2018   CHOLHDL 5.4 05/22/2015    Significant Diagnostic Results in last 30 days:  No results found.  Assessment and Plan  FTT (failure to thrive) in adult Progressive; continue Remeron 7.5 mg nightly  Hyperlipidemia Patient on no medications, secondary to age; in any case diet has been liberalized since patient is eating very little  Dementia with behavioral problem (HCC) Progressive; continue Namenda 10 mg twice daily as well as Depakote 125 mg twice daily for behaviors    Ariel Hole D. [name], MD

## 2018-07-05 ENCOUNTER — Encounter: Payer: Self-pay | Admitting: Internal Medicine

## 2018-07-05 NOTE — Assessment & Plan Note (Signed)
Progressive; continue Namenda 10 mg twice daily as well as Depakote 125 mg twice daily for behaviors

## 2018-07-05 NOTE — Assessment & Plan Note (Signed)
Patient on no medications, secondary to age; in any case diet has been liberalized since patient is eating very little

## 2018-07-05 NOTE — Assessment & Plan Note (Signed)
Progressive; continue Remeron 7.5 mg nightly

## 2018-07-20 LAB — BASIC METABOLIC PANEL
BUN: 25 — AB (ref 4–21)
Creatinine: 1.6 — AB (ref 0.5–1.1)
Glucose: 84
POTASSIUM: 4.1 (ref 3.4–5.3)
SODIUM: 143 (ref 137–147)

## 2018-07-28 ENCOUNTER — Non-Acute Institutional Stay (SKILLED_NURSING_FACILITY): Payer: Medicare Other | Admitting: Internal Medicine

## 2018-07-28 ENCOUNTER — Encounter: Payer: Self-pay | Admitting: Internal Medicine

## 2018-07-28 DIAGNOSIS — E785 Hyperlipidemia, unspecified: Secondary | ICD-10-CM

## 2018-07-28 DIAGNOSIS — I5042 Chronic combined systolic (congestive) and diastolic (congestive) heart failure: Secondary | ICD-10-CM | POA: Diagnosis not present

## 2018-07-28 DIAGNOSIS — I10 Essential (primary) hypertension: Secondary | ICD-10-CM | POA: Diagnosis not present

## 2018-07-28 NOTE — Progress Notes (Signed)
[facility] Room Number: [phone] Place of Service:  SNF [phone])  [name], MD  Patient Care Team: Margit HanksAlexander, Peyton Spengler D, MD as PCP - General (Internal Medicine) Pearson GrippeKim, James, MD (Internal Medicine)  Extended Emergency Contact Information Primary Emergency Contact: [facility]Shirley Address: [address]          [place], [place] [id number] [place] Home Phone: [phone] Mobile Phone: [phone] Relation: Daughter Secondary Emergency Contact: Asa Saunasliver,Cynthia  [place] Home Phone: [phone] Relation: Granddaughter    Allergies: Penicillins and Penicillins  Chief Complaint  Patient presents with  . Medical Management of Chronic Issues    Routine Visit    HPI: Patient is 84 y.o. 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  Past Medical History:  Diagnosis Date  . Acute CVA (cerebrovascular accident) (HCC)   . Acute renal failure (HCC) 05/25/2013  . AKI (acute kidney injury) (HCC)   . CKD (chronic kidney disease) stage 3, GFR 30-59 ml/min (HCC) 05/21/2015  . Closed left hip fracture (HCC)   . Coronary artery disease   . Dementia (HCC)   . Depression with anxiety   . Dysphagia due to recent stroke 05/31/2015  . Fall   . HLD (hyperlipidemia)   . Hx of arterial ischemic stroke 05/21/2015  . Hyperlipidemia   . Hypertension   . Hypokalemia   . Left displaced femoral neck fracture (HCC) 05/25/2016  . Leukocytosis 07/17/2016  . Protein-calorie malnutrition, severe 05/25/2016  . Syncope 05/24/2013  . Transient ischemic attack (TIA)   . Vitamin D deficiency 05/30/2015    Past Surgical History:  Procedure Laterality Date  . ABDOMINAL HYSTERECTOMY    . ANTERIOR APPROACH HEMI HIP ARTHROPLASTY Left 05/25/2016   Procedure: ANTERIOR APPROACH HEMI HIP ARTHROPLASTY;  Surgeon: Samson FredericBrian Swinteck, MD;   Location: [facility];  Service: Orthopedics;  Laterality: Left;    Allergies as of 07/28/2018      Reactions   Penicillins Swelling   Facial and hand swelling per daughter Has patient had a PCN reaction causing immediate rash, facial/tongue/throat swelling, SOB or lightheadedness with hypotension: Yes Has patient had a PCN reaction causing severe rash involving mucus membranes or skin necrosis: No Has patient had a PCN reaction that required hospitalization No Has patient had a PCN reaction occurring within the last 10 years: No If all of the above answers are "NO", then may proceed with Cephalosporin use.   Penicillins Swelling   Has patient had a PCN reaction causing immediate rash, facial/tongue/throat swelling, SOB or lightheadedness with hypotension: Yes Has patient had a PCN reaction causing severe rash involving mucus membranes or skin necrosis: No Has patient had a PCN reaction that required hospitalization No Has patient had a PCN reaction occurring within the last 10 years: No If all of the above answers are "NO", then may proceed with Cephalosporin use.      Medication List        Accurate as of 07/28/18 11:59 PM. Always use your most recent med list.          aspirin 81 MG EC tablet Take 1 tablet (81 mg total) by mouth 2 (two) times daily with a meal.   bisacodyl 5 MG EC tablet Commonly known as:  DULCOLAX Take 1 tablet (5 mg total) by mouth daily as needed for moderate constipation.   carvedilol 3.125 MG tablet Commonly  known as:  COREG Take 1 tablet (3.125 mg total) by mouth 2 (two) times daily with a meal.   divalproex 125 MG DR tablet Commonly known as:  DEPAKOTE Take 125 mg by mouth 2 (two) times daily.   feeding supplement Liqd Take 1 Container by mouth daily.   ferrous sulfate 325 (65 FE) MG tablet Take 325 mg by mouth daily with breakfast.   MAALOX ADVANCED PO Take by mouth. GIVE 15mL QID FOR INDIGESTION GIVE PRN   memantine 10 MG tablet Commonly  known as:  NAMENDA Take 10 mg by mouth 2 (two) times daily.   mirtazapine 15 MG tablet Commonly known as:  REMERON Take 7.5 mg by mouth at bedtime. 1/2 tab   multivitamin tablet Take 1 tablet by mouth daily.   nitroGLYCERIN 0.4 MG/SPRAY spray Commonly known as:  NITROLINGUAL Place 1 spray under the tongue. Place 1 tab under the tongue every 5 mins PRN for chest pain ( MAX DOSE 3 TABS) notify provider if no relief   saccharomyces boulardii 250 MG capsule Commonly known as:  FLORASTOR Take 250 mg by mouth 2 (two) times daily.   traMADol 50 MG tablet Commonly known as:  ULTRAM Take 50 mg by mouth at bedtime.       No orders of the defined types were placed in this encounter.   Immunization History  Administered Date(s) Administered  . Influenza,inj,Quad PF,6+ Mos 07/19/2016  . Influenza-Unspecified 06/27/2017  . PPD Test 05/28/2016  . Pneumococcal Conjugate-13 07/11/2017    Social History   Tobacco Use  . Smoking status: Never Smoker  . Smokeless tobacco: Never Used  Substance Use Topics  . Alcohol use: No    Review of Systems  DATA OBTAINED: from patient-limited; nursing-no acute concerns  GENERAL:  no fevers, fatigue, appetite changes SKIN: No itching, rash HEENT: No complaint RESPIRATORY: No cough, wheezing, SOB CARDIAC: No chest pain, palpitations, lower extremity edema  GI: No abdominal pain, No N/V/D or constipation, No heartburn or reflux  GU: No dysuria, frequency or urgency, or incontinence  MUSCULOSKELETAL: No unrelieved bone/joint pain NEUROLOGIC: No headache, dizziness  PSYCHIATRIC: No overt anxiety or sadness  Vitals:   07/28/18 1153  BP: (!) 144/56  Pulse: 67  Resp: 16  Temp: (!) 97.5 F (36.4 C)   Body mass index is 21.08 kg/m. Physical Exam  GENERAL APPEARANCE: Alert, conversant, No acute distress  SKIN: No diaphoresis rash HEENT: Unremarkable RESPIRATORY: Breathing is even, unlabored. Lung sounds are clear   CARDIOVASCULAR:  Heart RRR no murmurs, rubs or gallops. No peripheral edema  GASTROINTESTINAL: Abdomen is soft, non-tender, not distended w/ normal bowel sounds.  GENITOURINARY: Bladder non tender, not distended  MUSCULOSKELETAL: No abnormal joints or musculature NEUROLOGIC: Cranial nerves 2-12 grossly intact. Moves all extremities PSYCHIATRIC: Mood and affect with dementia no behavioral issues  Patient Active Problem List   Diagnosis Date Noted  . Anxiety 07/25/2017  . 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 FTT (failure to thrive) in adult 08/04/2016  . Mood disorder (HCC) 07/28/2016  . Leukocytosis 07/17/2016  . Pressure injury of skin 07/17/2016  . Malnutrition of moderate degree 07/17/2016  . C. difficile diarrhea 07/07/2016  . Pedal edema 06/27/2016  . Postoperative anemia due to acute blood loss 06/10/2016  . Newly recognized heart murmur 06/10/2016  . Acute renal failure superimposed on stage 3 chronic kidney disease (HCC) 06/10/2016  . Chronic combined systolic and diastolic CHF (congestive heart failure) (HCC) 05/28/2016  . Protein-calorie malnutrition, severe 05/25/2016  .  Left displaced femoral neck fracture (HCC) 05/25/2016  . Fall 05/24/2016  . Hypokalemia 05/24/2016  . AKI (acute kidney injury) (HCC) 05/24/2016  . Hypertensive heart disease with chronic combined systolic and diastolic congestive heart failure (HCC)   . Dementia with behavioral problem (HCC)   . Depression with anxiety   . HLD (hyperlipidemia)   . Dysphagia due to recent stroke 05/31/2015  . Hyperlipidemia 05/30/2015  . Vitamin D deficiency 05/30/2015  . Acute CVA (cerebrovascular accident) (HCC)   . Hx of arterial ischemic stroke 05/21/2015  . CKD (chronic kidney disease) stage 3, GFR 30-59 ml/min (HCC) 05/21/2015  . Dementia (HCC) 05/21/2015  . Hypertension 05/21/2015  . UTI (urinary tract infection) 05/25/2013  . Acute renal failure (HCC) 05/25/2013  . Syncope 05/24/2013    CMP     Component Value  Date/Time   NA 143 07/20/2018   K 4.1 07/20/2018   CL 105 01/03/2017 1526   CO2 23 01/03/2017 1526   GLUCOSE 103 (H) 01/03/2017 1526   BUN 25 (A) 07/20/2018   CREATININE 1.6 (A) 07/20/2018   CREATININE 1.55 (H) 01/03/2017 1526   CALCIUM 9.2 01/03/2017 1526   PROT 6.9 01/03/2017 1526   ALBUMIN 3.2 (L) 01/03/2017 1526   AST 16 06/16/2018   ALT 7 06/16/2018   ALKPHOS 74 06/16/2018   BILITOT 1.1 01/03/2017 1526   GFRNONAA 27 (L) 01/03/2017 1526   GFRAA 31 (L) 01/03/2017 1526   Recent Labs    03/20/18 06/16/18 07/20/18  NA 144 147 143  K 4.8 5.1 4.1  BUN 27* 26* 25*  CREATININE 1.3* 1.5* 1.6*   Recent Labs    03/20/18 06/16/18  AST 19 16  ALT 16 7  ALKPHOS 85 74   Recent Labs    03/20/18 06/16/18  WBC 5.2 6.8  6.8  HGB 11.8* 11.7*  11.7*  HCT 36 35*  35*  PLT 220 201  201   Recent Labs    06/16/18  CHOL 191  LDLCALC 132  TRIG 79   No results found for: [facility] Lab Results  Component Value Date   TSH 2.06 06/16/2018   Lab Results  Component Value Date   HGBA1C 5.9 06/16/2018   Lab Results  Component Value Date   CHOL 191 06/16/2018   HDL 43 06/16/2018   LDLCALC 132 06/16/2018   TRIG 79 06/16/2018   CHOLHDL 5.4 05/22/2015    Significant Diagnostic Results in last 30 days:  No results found.  Assessment and Plan  Hypertension Controlled for age; continue Coreg 3.125 mg twice daily  HLD (hyperlipidemia) LDL 132, HDL 43 on no meds; patient is on no meds secondary to age  Chronic combined systolic and diastolic CHF (congestive heart failure) (HCC) No reported exacerbation; continue Coreg 3.125 mg twice daily; patient is not on Lasix, will follow patient for signs and symptoms and will follow weights     Thurston Hole D. [name], MD

## 2018-08-02 ENCOUNTER — Encounter: Payer: Self-pay | Admitting: Internal Medicine

## 2018-08-02 NOTE — Assessment & Plan Note (Signed)
Controlled for age; continue Coreg 3.125 mg twice daily

## 2018-08-02 NOTE — Assessment & Plan Note (Signed)
LDL 132, HDL 43 on no meds; patient is on no meds secondary to age

## 2018-08-02 NOTE — Assessment & Plan Note (Signed)
No reported exacerbation; continue Coreg 3.125 mg twice daily; patient is not on Lasix, will follow patient for signs and symptoms and will follow weights

## 2018-08-25 ENCOUNTER — Other Ambulatory Visit: Payer: Self-pay

## 2018-08-25 MED ORDER — TRAMADOL HCL 50 MG PO TABS: 50.0000 mg | ORAL_TABLET | Freq: Every day | ORAL | 0 refills | Status: DC

## 2018-08-28 ENCOUNTER — Encounter: Payer: Self-pay | Admitting: Internal Medicine

## 2018-08-28 ENCOUNTER — Non-Acute Institutional Stay (SKILLED_NURSING_FACILITY): Payer: Medicare Other | Admitting: Internal Medicine

## 2018-08-28 DIAGNOSIS — I110 Hypertensive heart disease with heart failure: Secondary | ICD-10-CM

## 2018-08-28 DIAGNOSIS — F02818 Dementia in other diseases classified elsewhere, unspecified severity, with other behavioral disturbance: Secondary | ICD-10-CM

## 2018-08-28 DIAGNOSIS — Z8673 Personal history of transient ischemic attack (TIA), and cerebral infarction without residual deficits: Secondary | ICD-10-CM | POA: Diagnosis not present

## 2018-08-28 DIAGNOSIS — I5042 Chronic combined systolic (congestive) and diastolic (congestive) heart failure: Secondary | ICD-10-CM

## 2018-08-28 DIAGNOSIS — G301 Alzheimer's disease with late onset: Secondary | ICD-10-CM | POA: Diagnosis not present

## 2018-08-28 DIAGNOSIS — F0281 Dementia in other diseases classified elsewhere with behavioral disturbance: Secondary | ICD-10-CM

## 2018-08-28 NOTE — Progress Notes (Signed)
[facility] Room Number: [phone] Place of Service:  SNF [phone])  Randon Goldsmith. [name], MD  Patient Care Team: Margit Hanks, MD as PCP - General (Internal Medicine) [name], MD (Internal Medicine)  Extended Emergency Contact Information Primary Emergency Contact: [facility] Address: [address]          [place], [place] [address] [place] Home Phone: [phone] Mobile Phone: [phone] Relation: Daughter Secondary Emergency Contact: Asa Saunas [place] Home Phone: [phone] Relation: Granddaughter    Allergies: Penicillins and Penicillins  Chief Complaint  Patient presents with  . Medical Management of Chronic Issues    Routine Visit    HPI: Patient is 84 y.o. female who is being seen for routine issues of history of CVA, hypertension, and dementia.  Past Medical History:  Diagnosis Date  . Acute CVA (cerebrovascular accident) (HCC)   . Acute renal failure (HCC) 05/25/2013  . AKI (acute kidney injury) (HCC)   . CKD (chronic kidney disease) stage 3, GFR 30-59 ml/min (HCC) 05/21/2015  . Closed left hip fracture (HCC)   . Coronary artery disease   . Dementia (HCC)   . Depression with anxiety   . Dysphagia due to recent stroke 05/31/2015  . Fall   . HLD (hyperlipidemia)   . Hx of arterial ischemic stroke 05/21/2015  . Hyperlipidemia   . Hypertension   . Hypokalemia   . Left displaced femoral neck fracture (HCC) 05/25/2016  . Leukocytosis 07/17/2016  . Protein-calorie malnutrition, severe 05/25/2016  . Syncope 05/24/2013  . Transient ischemic attack (TIA)   . Vitamin D deficiency 05/30/2015    Past Surgical History:  Procedure Laterality Date  . ABDOMINAL HYSTERECTOMY    . ANTERIOR APPROACH HEMI HIP ARTHROPLASTY Left 05/25/2016   Procedure: ANTERIOR APPROACH HEMI HIP ARTHROPLASTY;  Surgeon: Samson Frederic, MD;  Location: [facility];  Service: Orthopedics;  Laterality:  Left;    Allergies as of 08/28/2018      Reactions   Penicillins Swelling   Facial and hand swelling per daughter Has patient had a PCN reaction causing immediate rash, facial/tongue/throat swelling, SOB or lightheadedness with hypotension: Yes Has patient had a PCN reaction causing severe rash involving mucus membranes or skin necrosis: No Has patient had a PCN reaction that required hospitalization No Has patient had a PCN reaction occurring within the last 10 years: No If all of the above answers are "NO", then may proceed with Cephalosporin use.   Penicillins Swelling   Has patient had a PCN reaction causing immediate rash, facial/tongue/throat swelling, SOB or lightheadedness with hypotension: Yes Has patient had a PCN reaction causing severe rash involving mucus membranes or skin necrosis: No Has patient had a PCN reaction that required hospitalization No Has patient had a PCN reaction occurring within the last 10 years: No If all of the above answers are "NO", then may proceed with Cephalosporin use.      Medication List       Accurate as of August 28, 2018 11:59 PM. Always use your most recent med list.        aspirin 81 MG EC tablet Take 1 tablet (81 mg total) by mouth 2 (two) times daily with a meal.   bisacodyl 5 MG EC tablet Commonly known as:  DULCOLAX Take 1 tablet (5 mg total) by mouth daily as needed for moderate constipation.   carvedilol 3.125 MG tablet Commonly known as:  COREG Take 1  tablet (3.125 mg total) by mouth 2 (two) times daily with a meal.   divalproex 125 MG DR tablet Commonly known as:  DEPAKOTE Take 125 mg by mouth 2 (two) times daily.   feeding supplement Liqd Take 1 Container by mouth daily.   ferrous sulfate 325 (65 FE) MG tablet Take 325 mg by mouth daily with breakfast.   MAALOX ADVANCED PO Take by mouth. GIVE 15mL QID FOR INDIGESTION GIVE PRN   memantine 10 MG tablet Commonly known as:  NAMENDA Take 10 mg by mouth 2 (two)  times daily.   mirtazapine 15 MG tablet Commonly known as:  REMERON Take 7.5 mg by mouth at bedtime. 1/2 tab   multivitamin tablet Take 1 tablet by mouth daily.   nitroGLYCERIN 0.4 MG/SPRAY spray Commonly known as:  NITROLINGUAL Place 1 spray under the tongue. Place 1 tab under the tongue every 5 mins PRN for chest pain ( MAX DOSE 3 TABS) notify provider if no relief   saccharomyces boulardii 250 MG capsule Commonly known as:  FLORASTOR Take 250 mg by mouth 2 (two) times daily.   traMADol 50 MG tablet Commonly known as:  ULTRAM Take 1 tablet (50 mg total) by mouth at bedtime.       No orders of the defined types were placed in this encounter.   Immunization History  Administered Date(s) Administered  . Influenza,inj,Quad PF,6+ Mos 07/19/2016  . Influenza-Unspecified 06/27/2017  . PPD Test 05/28/2016  . Pneumococcal Conjugate-13 07/11/2017    Social History   Tobacco Use  . Smoking status: Never Smoker  . Smokeless tobacco: Never Used  Substance Use Topics  . Alcohol use: No    Review of Systems  DATA OBTAINED: from patient-limited; nursing- no acute concerns GENERAL:  no fevers, fatigue, appetite changes SKIN: No itching, rash HEENT: No complaint RESPIRATORY: No cough, wheezing, SOB CARDIAC: No chest pain, palpitations, lower extremity edema  GI: No abdominal pain, No N/V/D or constipation, No heartburn or reflux  GU: No dysuria, frequency or urgency, or incontinence  MUSCULOSKELETAL: No unrelieved bone/joint pain NEUROLOGIC: No headache, dizziness  PSYCHIATRIC: No overt anxiety or sadness  Vitals:   08/28/18 1018  BP: 136/68  Pulse: 62  Resp: 17  Temp: 97.9 F (36.6 C)   Body mass index is 20.73 kg/m. Physical Exam  GENERAL APPEARANCE: Alert, conversant, No acute distress  SKIN: No diaphoresis rash HEENT: Unremarkable RESPIRATORY: Breathing is even, unlabored. Lung sounds are clear   CARDIOVASCULAR: Heart RRR no murmurs, rubs or gallops. No  peripheral edema  GASTROINTESTINAL: Abdomen is soft, non-tender, not distended w/ normal bowel sounds.  GENITOURINARY: Bladder non tender, not distended  MUSCULOSKELETAL: No abnormal joints or musculature NEUROLOGIC: Cranial nerves 2-12 grossly intact. Moves all extremities PSYCHIATRIC: Mood and affect dementia, no behavioral issues  Patient Active Problem List   Diagnosis Date Noted  . Anxiety 07/25/2017  . 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 FTT (failure to thrive) in adult 08/04/2016  . Mood disorder (HCC) 07/28/2016  . Leukocytosis 07/17/2016  . Pressure injury of skin 07/17/2016  . Malnutrition of moderate degree 07/17/2016  . C. difficile diarrhea 07/07/2016  . Pedal edema 06/27/2016  . Postoperative anemia due to acute blood loss 06/10/2016  . Newly recognized heart murmur 06/10/2016  . Acute renal failure superimposed on stage 3 chronic kidney disease (HCC) 06/10/2016  . Chronic combined systolic and diastolic CHF (congestive heart failure) (HCC) 05/28/2016  . Protein-calorie malnutrition, severe 05/25/2016  . Left displaced femoral neck fracture (HCC)  05/25/2016  . Fall 05/24/2016  . Hypokalemia 05/24/2016  . AKI (acute kidney injury) (HCC) 05/24/2016  . Hypertensive heart disease with chronic combined systolic and diastolic congestive heart failure (HCC)   . Dementia with behavioral problem (HCC)   . Depression with anxiety   . HLD (hyperlipidemia)   . Dysphagia due to recent stroke 05/31/2015  . Hyperlipidemia 05/30/2015  . Vitamin D deficiency 05/30/2015  . Acute CVA (cerebrovascular accident) (HCC)   . Hx of arterial ischemic stroke 05/21/2015  . CKD (chronic kidney disease) stage 3, GFR 30-59 ml/min (HCC) 05/21/2015  . Dementia (HCC) 05/21/2015  . Hypertension 05/21/2015  . UTI (urinary tract infection) 05/25/2013  . Acute renal failure (HCC) 05/25/2013  . Syncope 05/24/2013    CMP     Component Value Date/Time   NA 143 07/20/2018   K 4.1  07/20/2018   CL 105 01/03/2017 1526   CO2 23 01/03/2017 1526   GLUCOSE 103 (H) 01/03/2017 1526   BUN 25 (A) 07/20/2018   CREATININE 1.6 (A) 07/20/2018   CREATININE 1.55 (H) 01/03/2017 1526   CALCIUM 9.2 01/03/2017 1526   PROT 6.9 01/03/2017 1526   ALBUMIN 3.2 (L) 01/03/2017 1526   AST 16 06/16/2018   ALT 7 06/16/2018   ALKPHOS 74 06/16/2018   BILITOT 1.1 01/03/2017 1526   GFRNONAA 27 (L) 01/03/2017 1526   GFRAA 31 (L) 01/03/2017 1526   Recent Labs    03/20/18 06/16/18 07/20/18  NA 144 147 143  K 4.8 5.1 4.1  BUN 27* 26* 25*  CREATININE 1.3* 1.5* 1.6*   Recent Labs    03/20/18 06/16/18  AST 19 16  ALT 16 7  ALKPHOS 85 74   Recent Labs    03/20/18 06/16/18  WBC 5.2 6.8  6.8  HGB 11.8* 11.7*  11.7*  HCT 36 35*  35*  PLT 220 201  201   Recent Labs    06/16/18  CHOL 191  LDLCALC 132  TRIG 79   No results found for: [facility] Lab Results  Component Value Date   TSH 2.06 06/16/2018   Lab Results  Component Value Date   HGBA1C 5.9 06/16/2018   Lab Results  Component Value Date   CHOL 191 06/16/2018   HDL 43 06/16/2018   LDLCALC 132 06/16/2018   TRIG 79 06/16/2018   CHOLHDL 5.4 05/22/2015    Significant Diagnostic Results in last 30 days:  No results found.  Assessment and Plan  Hx of arterial ischemic stroke Stable; continue ASA 81 mg twice daily  Hypertensive heart disease with chronic combined systolic and diastolic congestive heart failure (HCC) Controlled; continue current regimen of Coreg 3.125 mg twice daily  Dementia Stable without major decline: Continue Namenda 10 mg twice daily    Ramisa Duman D. Lyn HollingsheadAlexander, MD

## 2018-08-29 ENCOUNTER — Encounter: Payer: Self-pay | Admitting: Internal Medicine

## 2018-08-29 NOTE — Assessment & Plan Note (Signed)
Stable; continue ASA 81 mg twice daily

## 2018-08-30 ENCOUNTER — Encounter: Payer: Self-pay | Admitting: Internal Medicine

## 2018-08-30 NOTE — Assessment & Plan Note (Signed)
Controlled; continue current regimen of Coreg 3.125 mg twice daily

## 2018-08-30 NOTE — Assessment & Plan Note (Signed)
Stable without major decline: Continue Namenda 10 mg twice daily

## 2018-09-09 ENCOUNTER — Encounter: Payer: Self-pay | Admitting: Internal Medicine

## 2018-09-09 DIAGNOSIS — M1990 Unspecified osteoarthritis, unspecified site: Secondary | ICD-10-CM | POA: Insufficient documentation

## 2018-09-24 ENCOUNTER — Other Ambulatory Visit: Payer: Self-pay

## 2018-09-25 ENCOUNTER — Other Ambulatory Visit: Payer: Self-pay | Admitting: Internal Medicine

## 2018-09-25 MED ORDER — TRAMADOL HCL 50 MG PO TABS: 50.0000 mg | ORAL_TABLET | Freq: Every day | ORAL | 0 refills | Status: DC

## 2018-10-02 ENCOUNTER — Encounter: Payer: Self-pay | Admitting: Internal Medicine

## 2018-10-02 NOTE — Progress Notes (Signed)
Location:  [facility] Room Number: [phone] Place of Service:  SNF [phone])  Randon Goldsmith. [name], MD  Patient Care Team: Margit Hanks, MD as PCP - General (Internal Medicine) [name], MD (Internal Medicine)  Extended Emergency Contact Information Primary Emergency Contact: [facility] Address: [address]          [place], [place] [address] [place] Home Phone: [phone] Mobile Phone: [phone] Relation: Daughter Secondary Emergency Contact: Asa Sa[place] Home Phone: [phone] Relation: Granddaughter    Allergies: Penicillins and Penicillins  Chief Complaint  Patient presents with  . Medical Management of Chronic Issues    Routine Visit    HPI: [name] who   Past Medical History:  Diagnosis Date  . Acute CVA (cerebrovascular accident) (HCC)   . Acute renal failure (HCC) 05/25/2013  . AKI (acute kidney injury) (HCC)   . CKD (chronic kidney disease) stage 3, GFR 30-59 ml/min (HCC) 05/21/2015  . Closed left hip fracture (HCC)   . Coronary artery disease   . Dementia (HCC)   . Depression with anxiety   . Dysphagia due to recent stroke 05/31/2015  . Fall   . HLD (hyperlipidemia)   . Hx of arterial ischemic stroke 05/21/2015  . Hyperlipidemia   . Hypertension   . Hypokalemia   . Left displaced femoral neck fracture (HCC) 05/25/2016  . Leukocytosis 07/17/2016  . Protein-calorie malnutrition, severe 05/25/2016  . Syncope 05/24/2013  . Transient ischemic attack (TIA)   . Vitamin D deficiency 05/30/2015    Past Surgical History:  Procedure Laterality Date  . ABDOMINAL HYSTERECTOMY    . ANTERIOR APPROACH HEMI HIP ARTHROPLASTY Left 05/25/2016   Procedure: ANTERIOR APPROACH HEMI HIP ARTHROPLASTY;  Surgeon: Samson Frederic, MD;  Location: [facility];  Service: Orthopedics;  Laterality: Left;    Allergies as of 10/02/2018      Reactions   Penicillins Swelling     Facial and hand swelling per daughter Has patient had a PCN reaction causing immediate rash, facial/tongue/throat swelling, SOB or lightheadedness with hypotension: Yes Has patient had a PCN reaction causing severe rash involving mucus membranes or skin necrosis: No Has patient had a PCN reaction that required hospitalization No Has patient had a PCN reaction occurring within the last 10 years: No If all of the above answers are "NO", then may proceed with Cephalosporin use.   Penicillins Swelling   Has patient had a PCN reaction causing immediate rash, facial/tongue/throat swelling, SOB or lightheadedness with hypotension: Yes Has patient had a PCN reaction causing severe rash involving mucus membranes or skin necrosis: No Has patient had a PCN reaction that required hospitalization No Has patient had a PCN reaction occurring within the last 10 years: No If all of the above answers are "NO", then may proceed with Cephalosporin use.      Medication List       Accurate as of October 02, 2018 12:37 PM. Always use your most recent med list.        aspirin 81 MG EC tablet Take 1 tablet (81 mg total) by mouth 2 (two) times daily with a meal.   bisacodyl 5 MG EC tablet Commonly known as:  DULCOLAX Take 1 tablet (5 mg total) by mouth daily as needed for moderate constipation.   carvedilol 3.125 MG tablet Commonly known as:  COREG Take 1 tablet (3.125 mg total) by mouth 2 (two) times daily with  a meal.   divalproex 125 MG DR tablet Commonly known as:  DEPAKOTE Take 125 mg by mouth 2 (two) times daily.   feeding supplement Liqd Take 1 Container by mouth daily.   ferrous sulfate 325 (65 FE) MG tablet Take 325 mg by mouth daily with breakfast.   MAALOX ADVANCED PO Take by mouth. GIVE 69mL QID FOR INDIGESTION GIVE PRN   memantine 10 MG tablet Commonly known as:  NAMENDA Take 10 mg by mouth 2 (two) times daily.   mirtazapine 15 MG tablet Commonly known as:  REMERON Take 7.5  mg by mouth at bedtime. 1/2 tab   multivitamin tablet Take 1 tablet by mouth daily.   nitroGLYCERIN 0.4 MG/SPRAY spray Commonly known as:  NITROLINGUAL Place 1 spray under the tongue. Place 1 tab under the tongue every 5 mins PRN for chest pain ( MAX DOSE 3 TABS) notify provider if no relief   saccharomyces boulardii 250 MG capsule Commonly known as:  FLORASTOR Take 250 mg by mouth 2 (two) times daily.   traMADol 50 MG tablet Commonly known as:  ULTRAM Take 1 tablet (50 mg total) by mouth at bedtime.       No orders of the defined types were placed in this encounter.   Immunization History  Administered Date(s) Administered  . Influenza,inj,Quad PF,6+ Mos 07/19/2016  . Influenza-Unspecified 06/27/2017  . PPD Test 05/28/2016  . Pneumococcal Conjugate-13 07/11/2017    Social History   Tobacco Use  . Smoking status: Never Smoker  . Smokeless tobacco: Never Used  Substance Use Topics  . Alcohol use: No    Review of Systems  DATA OBTAINED: from patient, nurse, medical record, family member GENERAL:  no fevers, fatigue, appetite changes SKIN: No itching, rash HEENT: No complaint RESPIRATORY: No cough, wheezing, SOB CARDIAC: No chest pain, palpitations, lower extremity edema  GI: No abdominal pain, No N/V/D or constipation, No heartburn or reflux  GU: No dysuria, frequency or urgency, or incontinence  MUSCULOSKELETAL: No unrelieved bone/joint pain NEUROLOGIC: No headache, dizziness  PSYCHIATRIC: No overt anxiety or sadness  Vitals:   10/02/18 1229 10/02/18 1231  BP: 134/78 134/78  Pulse: 79 79  Resp: 18 18  Temp: (!) 96.8 F (36 C) (!) 96.8 F (36 C)   Body mass index is 20.19 kg/m. Physical Exam  GENERAL APPEARANCE: Alert, conversant, No acute distress  SKIN: No diaphoresis rash HEENT: Unremarkable RESPIRATORY: Breathing is even, unlabored. Lung sounds are clear   CARDIOVASCULAR: Heart RRR no murmurs, rubs or gallops. No peripheral edema   GASTROINTESTINAL: Abdomen is soft, non-tender, not distended w/ normal bowel sounds.  GENITOURINARY: Bladder non tender, not distended  MUSCULOSKELETAL: No abnormal joints or musculature NEUROLOGIC: Cranial nerves 2-12 grossly intact. Moves all extremities PSYCHIATRIC: Mood and affect appropriate to situation, no behavioral issues  Patient Active Problem List   Diagnosis Date Noted  . Osteoarthritis 09/09/2018  . Anxiety 07/25/2017  . 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 FTT (failure to thrive) in adult 08/04/2016  . Mood disorder (HCC) 07/28/2016  . Leukocytosis 07/17/2016  . Pressure injury of skin 07/17/2016  . Malnutrition of moderate degree 07/17/2016  . C. difficile diarrhea 07/07/2016  . Pedal edema 06/27/2016  . Postoperative anemia due to acute blood loss 06/10/2016  . Newly recognized heart murmur 06/10/2016  . Acute renal failure superimposed on stage 3 chronic kidney disease (HCC) 06/10/2016  . Chronic combined systolic and diastolic CHF (congestive heart failure) (HCC) 05/28/2016  . Protein-calorie malnutrition, severe 05/25/2016  .  Left displaced femoral neck fracture (HCC) 05/25/2016  . Fall 05/24/2016  . Hypokalemia 05/24/2016  . AKI (acute kidney injury) (HCC) 05/24/2016  . Hypertensive heart disease with chronic combined systolic and diastolic congestive heart failure (HCC)   . Dementia with behavioral problem (HCC)   . Depression with anxiety   . HLD (hyperlipidemia)   . Dysphagia due to recent stroke 05/31/2015  . Hyperlipidemia 05/30/2015  . Vitamin D deficiency 05/30/2015  . Acute CVA (cerebrovascular accident) (HCC)   . Hx of arterial ischemic stroke 05/21/2015  . CKD (chronic kidney disease) stage 3, GFR 30-59 ml/min (HCC) 05/21/2015  . Dementia (HCC) 05/21/2015  . Hypertension 05/21/2015  . UTI (urinary tract infection) 05/25/2013  . Acute renal failure (HCC) 05/25/2013  . Syncope 05/24/2013    CMP     Component Value Date/Time   NA 143  07/20/2018   K 4.1 07/20/2018   CL 105 01/03/2017 1526   CO2 23 01/03/2017 1526   GLUCOSE 103 (H) 01/03/2017 1526   BUN 25 (A) 07/20/2018   CREATININE 1.6 (A) 07/20/2018   CREATININE 1.55 (H) 01/03/2017 1526   CALCIUM 9.2 01/03/2017 1526   PROT 6.9 01/03/2017 1526   ALBUMIN 3.2 (L) 01/03/2017 1526   AST 16 06/16/2018   ALT 7 06/16/2018   ALKPHOS 74 06/16/2018   BILITOT 1.1 01/03/2017 1526   GFRNONAA 27 (L) 01/03/2017 1526   GFRAA 31 (L) 01/03/2017 1526   Recent Labs    03/20/18 06/16/18 07/20/18  NA 144 147 143  K 4.8 5.1 4.1  BUN 27* 26* 25*  CREATININE 1.3* 1.5* 1.6*   Recent Labs    03/20/18 06/16/18  AST 19 16  ALT 16 7  ALKPHOS 85 74   Recent Labs    03/20/18 06/16/18  WBC 5.2 6.8  6.8  HGB 11.8* 11.7*  11.7*  HCT 36 35*  35*  PLT 220 201  201   Recent Labs    06/16/18  CHOL 191  LDLCALC 132  TRIG 79   [facility] results found for: [facility] Lab Results  Component Value Date   TSH 2.06 06/16/2018   Lab Results  Component Value Date   HGBA1C 5.9 06/16/2018   Lab Results  Component Value Date   CHOL 191 06/16/2018   HDL 43 06/16/2018   LDLCALC 132 06/16/2018   TRIG 79 06/16/2018   CHOLHDL 5.4 05/22/2015    Significant Diagnostic Results in last 30 days:  No results found.  Assessment and Plan  No problem-specific Assessment & Plan notes found for this encounter.   Labs/tests ordered:    Randon GoldsmithAnne D. Lyn HollingsheadAlexander, MD

## 2018-10-03 NOTE — Progress Notes (Signed)
This encounter was created in error - please disregard.

## 2018-10-08 ENCOUNTER — Non-Acute Institutional Stay (SKILLED_NURSING_FACILITY): Payer: Medicare Other | Admitting: Internal Medicine

## 2018-10-08 DIAGNOSIS — R635 Abnormal weight gain: Secondary | ICD-10-CM

## 2018-10-10 ENCOUNTER — Encounter: Payer: Self-pay | Admitting: Internal Medicine

## 2018-10-10 NOTE — Progress Notes (Signed)
Location:  [facility]   Place of Service:  SNF ([id number])SNF  Margit Hanks, MD  Patient Care Team: Margit Hanks, MD as PCP - General (Internal Medicine) [name], MD (Internal Medicine)  Extended Emergency Contact Information Primary Emergency Contact: [facility] Address: [address]          [place], [place] [address] [place] Home Phone: [phone] Mobile Phone: [phone] Relation: Daughter Secondary Emergency Contact: Asa [place] Home Phone: [phone] Relation: Granddaughter    Allergies: Penicillins and Penicillins  Chief Complaint  Patient presents with  . Acute Visit    HPI: [name] who who is being seen because she has had a 6 pound weight gain in the past week, her weight is gone from 114 220.  Patient denies shortness of breath or chest pain or unusual swelling.  Past Medical History:  Diagnosis Date  . Acute CVA (cerebrovascular accident) (HCC)   . Acute renal failure (HCC) 05/25/2013  . AKI (acute kidney injury) (HCC)   . CKD (chronic kidney disease) stage 3, GFR 30-59 ml/min (HCC) 05/21/2015  . Closed left hip fracture (HCC)   . Coronary artery disease   . Dementia (HCC)   . Depression with anxiety   . Dysphagia due to recent stroke 05/31/2015  . Fall   . HLD (hyperlipidemia)   . Hx of arterial ischemic stroke 05/21/2015  . Hyperlipidemia   . Hypertension   . Hypokalemia   . Left displaced femoral neck fracture (HCC) 05/25/2016  . Leukocytosis 07/17/2016  . Protein-calorie malnutrition, severe 05/25/2016  . Syncope 05/24/2013  . Transient ischemic attack (TIA)   . Vitamin D deficiency 05/30/2015    Past Surgical History:  Procedure Laterality Date  . ABDOMINAL HYSTERECTOMY    . ANTERIOR APPROACH HEMI HIP ARTHROPLASTY Left 05/25/2016   Procedure: ANTERIOR APPROACH HEMI HIP ARTHROPLASTY;  Surgeon: Samson Frederic, MD;  Location: [facility];  Service: Orthopedics;  Laterality: Left;    Allergies as of 10/08/2018      Reactions   Penicillins Swelling   Facial and hand swelling per daughter Has patient had a PCN reaction causing immediate rash, facial/tongue/throat swelling, SOB or lightheadedness with hypotension: Yes Has patient had a PCN reaction causing severe rash involving mucus membranes or skin necrosis: No Has patient had a PCN reaction that required hospitalization No Has patient had a PCN reaction occurring within the last 10 years: No If all of the above answers are "NO", then may proceed with Cephalosporin use.   Penicillins Swelling   Has patient had a PCN reaction causing immediate rash, facial/tongue/throat swelling, SOB or lightheadedness with hypotension: Yes Has patient had a PCN reaction causing severe rash involving mucus membranes or skin necrosis: No Has patient had a PCN reaction that required hospitalization No Has patient had a PCN reaction occurring within the last 10 years: No If all of the above answers are "NO", then may proceed with Cephalosporin use.      Medication List       Accurate as of October 08, 2018 11:59 PM. Always use your most recent med list.        aspirin 81 MG EC tablet Take 1 tablet (81 mg total) by mouth 2 (two) times daily with a meal.   bisacodyl 5 MG EC tablet Commonly known as:  DULCOLAX Take 1 tablet (5 mg total) by mouth daily as needed for moderate constipation.  carvedilol 3.125 MG tablet Commonly known as:  COREG Take 1 tablet (3.125 mg total) by mouth 2 (two) times daily with a meal.   divalproex 125 MG DR tablet Commonly known as:  DEPAKOTE Take 125 mg by mouth 2 (two) times daily.   feeding supplement Liqd Take 1 Container by mouth daily.   ferrous sulfate 325 (65 FE) MG tablet Take 325 mg by mouth daily with breakfast.   MAALOX ADVANCED PO Take by mouth. GIVE 15mL QID FOR INDIGESTION GIVE PRN   memantine 10 MG tablet Commonly known as:   NAMENDA Take 10 mg by mouth 2 (two) times daily.   mirtazapine 15 MG tablet Commonly known as:  REMERON Take 7.5 mg by mouth at bedtime. 1/2 tab   multivitamin tablet Take 1 tablet by mouth daily.   nitroGLYCERIN 0.4 MG/SPRAY spray Commonly known as:  NITROLINGUAL Place 1 spray under the tongue. Place 1 tab under the tongue every 5 mins PRN for chest pain ( MAX DOSE 3 TABS) notify provider if no relief   saccharomyces boulardii 250 MG capsule Commonly known as:  FLORASTOR Take 250 mg by mouth 2 (two) times daily.   traMADol 50 MG tablet Commonly known as:  ULTRAM Take 1 tablet (50 mg total) by mouth at bedtime.       No orders of the defined types were placed in this encounter.   Immunization History  Administered Date(s) Administered  . Influenza,inj,Quad PF,6+ Mos 07/19/2016  . Influenza-Unspecified 06/27/2017  . PPD Test 05/28/2016  . Pneumococcal Conjugate-13 07/11/2017    Social History   Tobacco Use  . Smoking status: Never Smoker  . Smokeless tobacco: Never Used  Substance Use Topics  . Alcohol use: No    Review of Systems  DATA OBTAINED: from patient GENERAL:  no fevers, fatigue, appetite changes SKIN: No itching, rash HEENT: No complaint RESPIRATORY: No cough, wheezing, SOB CARDIAC: No chest pain, palpitations, lower extremity edema  GI: No abdominal pain, No N/V/D or constipation, No heartburn or reflux  GU: No dysuria, frequency or urgency, or incontinence  MUSCULOSKELETAL: No unrelieved bone/joint pain NEUROLOGIC: No headache, dizziness  PSYCHIATRIC: No overt anxiety or sadness  Vitals:   10/10/18 2031  BP: 131/73  Pulse: 92  Resp: 18  Temp: (!) 97 F (36.1 C)   Body mass index is 20.19 kg/m. Physical Exam  GENERAL APPEARANCE: Alert, conversant, No acute distress  SKIN: No diaphoresis rash HEENT: Unremarkable RESPIRATORY: Breathing is even, unlabored. Lung sounds are clear   CARDIOVASCULAR: Heart RRR no murmurs, rubs or gallops.  No peripheral edema  GASTROINTESTINAL: Abdomen is soft, non-tender, not distended w/ normal bowel sounds.  GENITOURINARY: Bladder non tender, not distended  MUSCULOSKELETAL: No abnormal joints or musculature NEUROLOGIC: Cranial nerves 2-12 grossly intact. Moves all extremities PSYCHIATRIC: Mood and affect appropriate to situation, no behavioral issues  Patient Active Problem List   Diagnosis Date Noted  . Osteoarthritis 09/09/2018  . Anxiety 07/25/2017  . 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 FTT (failure to thrive) in adult 08/04/2016  . Mood disorder (HCC) 07/28/2016  . Leukocytosis 07/17/2016  . Pressure injury of skin 07/17/2016  . Malnutrition of moderate degree 07/17/2016  . C. difficile diarrhea 07/07/2016  . Pedal edema 06/27/2016  . Postoperative anemia due to acute blood loss 06/10/2016  . Newly recognized heart murmur 06/10/2016  . Acute renal failure superimposed on stage 3 chronic kidney disease (HCC) 06/10/2016  . Chronic combined systolic and diastolic CHF (congestive heart failure) (HCC) 05/28/2016  .  Protein-calorie malnutrition, severe 05/25/2016  . Left displaced femoral neck fracture (HCC) 05/25/2016  . Fall 05/24/2016  . Hypokalemia 05/24/2016  . AKI (acute kidney injury) (HCC) 05/24/2016  . Hypertensive heart disease with chronic combined systolic and diastolic congestive heart failure (HCC)   . Dementia with behavioral problem (HCC)   . Depression with anxiety   . HLD (hyperlipidemia)   . Dysphagia due to recent stroke 05/31/2015  . Hyperlipidemia 05/30/2015  . Vitamin D deficiency 05/30/2015  . Acute CVA (cerebrovascular accident) (HCC)   . Hx of arterial ischemic stroke 05/21/2015  . CKD (chronic kidney disease) stage 3, GFR 30-59 ml/min (HCC) 05/21/2015  . Dementia (HCC) 05/21/2015  . Hypertension 05/21/2015  . UTI (urinary tract infection) 05/25/2013  . Acute renal failure (HCC) 05/25/2013  . Syncope 05/24/2013    CMP     Component Value  Date/Time   NA 143 07/20/2018   K 4.1 07/20/2018   CL 105 01/03/2017 1526   CO2 23 01/03/2017 1526   GLUCOSE 103 (H) 01/03/2017 1526   BUN 25 (A) 07/20/2018   CREATININE 1.6 (A) 07/20/2018   CREATININE 1.55 (H) 01/03/2017 1526   CALCIUM 9.2 01/03/2017 1526   PROT 6.9 01/03/2017 1526   ALBUMIN 3.2 (L) 01/03/2017 1526   AST 16 06/16/2018   ALT 7 06/16/2018   ALKPHOS 74 06/16/2018   BILITOT 1.1 01/03/2017 1526   GFRNONAA 27 (L) 01/03/2017 1526   GFRAA 31 (L) 01/03/2017 1526   Recent Labs    03/20/18 06/16/18 07/20/18  NA 144 147 143  K 4.8 5.1 4.1  BUN 27* 26* 25*  CREATININE 1.3* 1.5* 1.6*   Recent Labs    03/20/18 06/16/18  AST 19 16  ALT 16 7  ALKPHOS 85 74   Recent Labs    03/20/18 06/16/18  WBC 5.2 6.8  6.8  HGB 11.8* 11.7*  11.7*  HCT 36 35*  35*  PLT 220 201  201   Recent Labs    06/16/18  CHOL 191  LDLCALC 132  TRIG 79   No results found for: [facility] Lab Results  Component Value Date   TSH 2.06 06/16/2018   Lab Results  Component Value Date   HGBA1C 5.9 06/16/2018   Lab Results  Component Value Date   CHOL 191 06/16/2018   HDL 43 06/16/2018   LDLCALC 132 06/16/2018   TRIG 79 06/16/2018   CHOLHDL 5.4 05/22/2015    Significant Diagnostic Results in last 30 days:  No results found.  Assessment and Plan  Weight gain- despite the fact the patient has had significant weight gain I see no signs of fluid overload-lungs are clear there is no edema patient has no symptoms.  Patient will be getting her weight taken again this week-we will monitor    Merrilee Seashore, MD

## 2018-10-28 ENCOUNTER — Non-Acute Institutional Stay (SKILLED_NURSING_FACILITY): Payer: Medicare Other | Admitting: Internal Medicine

## 2018-10-28 ENCOUNTER — Encounter: Payer: Self-pay | Admitting: Internal Medicine

## 2018-10-28 DIAGNOSIS — M159 Polyosteoarthritis, unspecified: Secondary | ICD-10-CM

## 2018-10-28 DIAGNOSIS — M150 Primary generalized (osteo)arthritis: Secondary | ICD-10-CM

## 2018-10-28 DIAGNOSIS — N183 Chronic kidney disease, stage 3 unspecified: Secondary | ICD-10-CM

## 2018-10-28 DIAGNOSIS — F418 Other specified anxiety disorders: Secondary | ICD-10-CM | POA: Diagnosis not present

## 2018-10-28 NOTE — Progress Notes (Signed)
Location:  [facility] Room Number: [phone] Place of Service:  SNF [phone])  Ariel Douglas. [name], MD  Patient Care Team: Margit Hanks, MD as PCP - General (Internal Medicine) [name], MD (Internal Medicine)  Extended Emergency Contact Information Primary Emergency Contact: [facility] Address: [address]          [place], [place] [address] [place] of [place] Home Phone: [phone] Mobile Phone: [phone] Relation: Daughter Secondary Emergency Contact: Asa Saunas [place] Home Phone: [phone] Relation: Granddaughter    Allergies: Penicillins and Penicillins  Chief Complaint  Patient presents with  . Medical Management of Chronic Issues    Routine visit    HPI: [name] who is being seen for routine issues of osteoarthritis, chronic kidney disease stage III, and depression with anxiety.  Past Medical History:  Diagnosis Date  . Acute CVA (cerebrovascular accident) (HCC)   . Acute renal failure (HCC) 05/25/2013  . AKI (acute kidney injury) (HCC)   . CKD (chronic kidney disease) stage 3, GFR 30-59 ml/min (HCC) 05/21/2015  . Closed left hip fracture (HCC)   . Coronary artery disease   . Dementia (HCC)   . Depression with anxiety   . Dysphagia due to recent stroke 05/31/2015  . Fall   . HLD (hyperlipidemia)   . Hx of arterial ischemic stroke 05/21/2015  . Hyperlipidemia   . Hypertension   . Hypokalemia   . Left displaced femoral neck fracture (HCC) 05/25/2016  . Leukocytosis 07/17/2016  . Protein-calorie malnutrition, severe 05/25/2016  . Syncope 05/24/2013  . Transient ischemic attack (TIA)   . Vitamin D deficiency 05/30/2015    Past Surgical History:  Procedure Laterality Date  . ABDOMINAL HYSTERECTOMY    . ANTERIOR APPROACH HEMI HIP ARTHROPLASTY Left 05/25/2016   Procedure: ANTERIOR APPROACH HEMI HIP ARTHROPLASTY;  Surgeon: Samson Frederic, MD;  Location: [facility];   Service: Orthopedics;  Laterality: Left;    Allergies as of 10/28/2018      Reactions   Penicillins Swelling   Facial and hand swelling per daughter Has patient had a PCN reaction causing immediate rash, facial/tongue/throat swelling, SOB or lightheadedness with hypotension: Yes Has patient had a PCN reaction causing severe rash involving mucus membranes or skin necrosis: No Has patient had a PCN reaction that required hospitalization No Has patient had a PCN reaction occurring within the last 10 years: No If all of the above answers are "NO", then may proceed with Cephalosporin use.   Penicillins Swelling   Has patient had a PCN reaction causing immediate rash, facial/tongue/throat swelling, SOB or lightheadedness with hypotension: Yes Has patient had a PCN reaction causing severe rash involving mucus membranes or skin necrosis: No Has patient had a PCN reaction that required hospitalization No Has patient had a PCN reaction occurring within the last 10 years: No If all of the above answers are "NO", then may proceed with Cephalosporin use.      Medication List       Accurate as of October 28, 2018 11:59 PM. Always use your most recent med list.        aspirin 81 MG EC tablet Take 1 tablet (81 mg total) by mouth 2 (two) times daily with a meal.   bisacodyl 5 MG EC tablet Commonly known as:  DULCOLAX Take 1 tablet (5 mg total) by mouth daily as needed for moderate constipation.   carvedilol 3.125 MG tablet Commonly known as:  COREG Take 1 tablet (3.125 mg total) by mouth 2 (two) times daily with a meal.   divalproex 125 MG DR tablet Commonly known as:  DEPAKOTE Take 125 mg by mouth 2 (two) times daily.   feeding supplement Liqd Take 1 Container by mouth daily.   ferrous sulfate 325 (65 FE) MG tablet Take 325 mg by mouth daily with breakfast.   MAALOX ADVANCED PO Take by mouth. GIVE 15mL QID FOR INDIGESTION GIVE PRN   memantine 10 MG tablet Commonly known as:   NAMENDA Take 10 mg by mouth 2 (two) times daily.   mirtazapine 15 MG tablet Commonly known as:  REMERON Take 7.5 mg by mouth at bedtime. 1/2 tab   multivitamin tablet Take 1 tablet by mouth daily.   nitroGLYCERIN 0.4 MG/SPRAY spray Commonly known as:  NITROLINGUAL Place 1 spray under the tongue. Place 1 tab under the tongue every 5 mins PRN for chest pain ( MAX DOSE 3 TABS) notify provider if no relief   saccharomyces boulardii 250 MG capsule Commonly known as:  FLORASTOR Take 250 mg by mouth 2 (two) times daily.   traMADol 50 MG tablet Commonly known as:  ULTRAM Take 1 tablet (50 mg total) by mouth at bedtime.       No orders of the defined types were placed in this encounter.   Immunization History  Administered Date(s) Administered  . Influenza,inj,Quad PF,6+ Mos 07/19/2016  . Influenza-Unspecified 06/27/2017  . PPD Test 05/28/2016  . Pneumococcal Conjugate-13 07/11/2017    Social History   Tobacco Use  . Smoking status: Never Smoker  . Smokeless tobacco: Never Used  Substance Use Topics  . Alcohol use: No    Review of Systems  DATA OBTAINED: from patient-limited; nursing- no acute concerns GENERAL:  no fevers, fatigue, appetite changes SKIN: No itching, rash HEENT: No complaint RESPIRATORY: No cough, wheezing, SOB CARDIAC: No chest pain, palpitations, lower extremity edema  GI: No abdominal pain, No N/V/D or constipation, No heartburn or reflux  GU: No dysuria, frequency or urgency, or incontinence  MUSCULOSKELETAL: No unrelieved bone/joint pain NEUROLOGIC: No headache, dizziness  PSYCHIATRIC: No overt anxiety or sadness  Vitals:   10/28/18 0823  BP: (!) 143/87  Pulse: 61  Resp: 19  Temp: (!) 97.3 F (36.3 C)   Body mass index is 21.4 kg/m. Physical Exam  GENERAL APPEARANCE: Alert, conversant, No acute distress  SKIN: No diaphoresis rash HEENT: Unremarkable RESPIRATORY: Breathing is even, unlabored. Lung sounds are clear   CARDIOVASCULAR:  Heart RRR no murmurs, rubs or gallops. No peripheral edema  GASTROINTESTINAL: Abdomen is soft, non-tender, not distended w/ normal bowel sounds.  GENITOURINARY: Bladder non tender, not distended  MUSCULOSKELETAL: No abnormal joints or musculature NEUROLOGIC: Cranial nerves 2-12 grossly intact. Moves all extremities PSYCHIATRIC: Mood and affect dementia, no behavioral issues  Patient Active Problem List   Diagnosis Date Noted  . Osteoarthritis 09/09/2018  . Anxiety 07/25/2017  . 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 FTT (failure to thrive) in adult 08/04/2016  . Mood disorder (HCC) 07/28/2016  . Leukocytosis 07/17/2016  . Pressure injury of skin 07/17/2016  . Malnutrition of moderate degree 07/17/2016  . C. difficile diarrhea 07/07/2016  . Pedal edema 06/27/2016  . Postoperative anemia due to acute blood loss 06/10/2016  . Newly recognized heart murmur 06/10/2016  . Acute renal failure superimposed on stage 3 chronic kidney disease (HCC) 06/10/2016  . Chronic combined systolic and diastolic CHF (congestive heart failure) (HCC) 05/28/2016  . Protein-calorie malnutrition, severe  05/25/2016  . Left displaced femoral neck fracture (HCC) 05/25/2016  . Fall 05/24/2016  . Hypokalemia 05/24/2016  . AKI (acute kidney injury) (HCC) 05/24/2016  . Hypertensive heart disease with chronic combined systolic and diastolic congestive heart failure (HCC)   . Dementia with behavioral problem (HCC)   . Depression with anxiety   . HLD (hyperlipidemia)   . Dysphagia due to recent stroke 05/31/2015  . Hyperlipidemia 05/30/2015  . Vitamin D deficiency 05/30/2015  . Acute CVA (cerebrovascular accident) (HCC)   . Hx of arterial ischemic stroke 05/21/2015  . CKD (chronic kidney disease) stage 3, GFR 30-59 ml/min (HCC) 05/21/2015  . Dementia (HCC) 05/21/2015  . Hypertension 05/21/2015  . UTI (urinary tract infection) 05/25/2013  . Acute renal failure (HCC) 05/25/2013  . Syncope 05/24/2013     CMP     Component Value Date/Time   NA 143 07/20/2018   K 4.1 07/20/2018   CL 105 01/03/2017 1526   CO2 23 01/03/2017 1526   GLUCOSE 103 (H) 01/03/2017 1526   BUN 25 (A) 07/20/2018   CREATININE 1.6 (A) 07/20/2018   CREATININE 1.55 (H) 01/03/2017 1526   CALCIUM 9.2 01/03/2017 1526   PROT 6.9 01/03/2017 1526   ALBUMIN 3.2 (L) 01/03/2017 1526   AST 16 06/16/2018   ALT 7 06/16/2018   ALKPHOS 74 06/16/2018   BILITOT 1.1 01/03/2017 1526   GFRNONAA 27 (L) 01/03/2017 1526   GFRAA 31 (L) 01/03/2017 1526   Recent Labs    03/20/18 06/16/18 07/20/18  NA 144 147 143  K 4.8 5.1 4.1  BUN 27* 26* 25*  CREATININE 1.3* 1.5* 1.6*   Recent Labs    03/20/18 06/16/18  AST 19 16  ALT 16 7  ALKPHOS 85 74   Recent Labs    03/20/18 06/16/18  WBC 5.2 6.8  6.8  HGB 11.8* 11.7*  11.7*  HCT 36 35*  35*  PLT 220 201  201   Recent Labs    06/16/18  CHOL 191  LDLCALC 132  TRIG 79   No results found for: [facility] Lab Results  Component Value Date   TSH 2.06 06/16/2018   Lab Results  Component Value Date   HGBA1C 5.9 06/16/2018   Lab Results  Component Value Date   CHOL 191 06/16/2018   HDL 43 06/16/2018   LDLCALC 132 06/16/2018   TRIG 79 06/16/2018   CHOLHDL 5.4 05/22/2015    Significant Diagnostic Results in last 30 days:  No results found.  Assessment and Plan  Osteoarthritis No complaints of major problems; controlled on tramadol 50 mg nightly; continue current therapy  CKD (chronic kidney disease) stage 3, GFR 30-59 ml/min No recent GFR; recent creatinine 1.6 which is stable from prior; monitor intervals  Depression with anxiety Appears controlled or maybe even in remission on no meds except for Remeron 7.5 mg nightly; continue supportive care    Ariel Douglas D. [name], MD

## 2018-11-01 ENCOUNTER — Encounter: Payer: Self-pay | Admitting: Internal Medicine

## 2018-11-01 NOTE — Assessment & Plan Note (Signed)
No complaints of major problems; controlled on tramadol 50 mg nightly; continue current therapy

## 2018-11-01 NOTE — Assessment & Plan Note (Signed)
No recent GFR; recent creatinine 1.6 which is stable from prior; monitor intervals

## 2018-11-01 NOTE — Assessment & Plan Note (Signed)
Appears controlled or maybe even in remission on no meds except for Remeron 7.5 mg nightly; continue supportive care

## 2018-11-19 ENCOUNTER — Other Ambulatory Visit: Payer: Self-pay | Admitting: Internal Medicine

## 2018-11-19 MED ORDER — TRAMADOL HCL 50 MG PO TABS: 50.0000 mg | ORAL_TABLET | Freq: Every day | ORAL | 0 refills | Status: DC

## 2018-12-17 ENCOUNTER — Encounter: Payer: Self-pay | Admitting: Internal Medicine

## 2018-12-17 ENCOUNTER — Non-Acute Institutional Stay (SKILLED_NURSING_FACILITY): Payer: Medicare Other | Admitting: Internal Medicine

## 2018-12-17 DIAGNOSIS — Z8673 Personal history of transient ischemic attack (TIA), and cerebral infarction without residual deficits: Secondary | ICD-10-CM | POA: Diagnosis not present

## 2018-12-17 DIAGNOSIS — I5042 Chronic combined systolic (congestive) and diastolic (congestive) heart failure: Secondary | ICD-10-CM | POA: Diagnosis not present

## 2018-12-17 DIAGNOSIS — I110 Hypertensive heart disease with heart failure: Secondary | ICD-10-CM

## 2018-12-17 NOTE — Progress Notes (Signed)
Location:  [facility] Room Number: [phone] Place of Service:  SNF [phone])  Randon Goldsmith. [name], MD  Patient Care Team: Margit Hanks, MD as PCP - General (Internal Medicine) [name], MD (Internal Medicine)  Extended Emergency Contact Information Primary Emergency Contact: [facility] Address: [address]          [place], [place] [address] [place] of [place] Home Phone: [phone] Mobile Phone: [phone] Relation: Daughter Secondary Emergency Contact: Asa [place] Home Phone: [phone] Relation: Granddaughter    Allergies: Penicillins and Penicillins  Chief Complaint  Patient presents with  . Medical Management of Chronic Issues    Routine visit    HPI: [name] who is being seen for routine issues of history of stroke, hypertension, and chronic combined systolic and diastolic congestive heart failure.  Past Medical History:  Diagnosis Date  . Acute CVA (cerebrovascular accident) (HCC)   . Acute renal failure (HCC) 05/25/2013  . AKI (acute kidney injury) (HCC)   . CKD (chronic kidney disease) stage 3, GFR 30-59 ml/min (HCC) 05/21/2015  . Closed left hip fracture (HCC)   . Coronary artery disease   . Dementia (HCC)   . Depression with anxiety   . Dysphagia due to recent stroke 05/31/2015  . Fall   . HLD (hyperlipidemia)   . Hx of arterial ischemic stroke 05/21/2015  . Hyperlipidemia   . Hypertension   . Hypokalemia   . Left displaced femoral neck fracture (HCC) 05/25/2016  . Leukocytosis 07/17/2016  . Protein-calorie malnutrition, severe 05/25/2016  . Syncope 05/24/2013  . Transient ischemic attack (TIA)   . Vitamin D deficiency 05/30/2015    Past Surgical History:  Procedure Laterality Date  . ABDOMINAL HYSTERECTOMY    . ANTERIOR APPROACH HEMI HIP ARTHROPLASTY Left 05/25/2016   Procedure: ANTERIOR APPROACH HEMI HIP ARTHROPLASTY;  Surgeon: Samson Frederic,  MD;  Location: [facility];  Service: Orthopedics;  Laterality: Left;    Allergies as of 12/17/2018      Reactions   Penicillins Swelling   Facial and hand swelling per daughter Has patient had a PCN reaction causing immediate rash, facial/tongue/throat swelling, SOB or lightheadedness with hypotension: Yes Has patient had a PCN reaction causing severe rash involving mucus membranes or skin necrosis: No Has patient had a PCN reaction that required hospitalization No Has patient had a PCN reaction occurring within the last 10 years: No If all of the above answers are "NO", then may proceed with Cephalosporin use.   Penicillins Swelling   Has patient had a PCN reaction causing immediate rash, facial/tongue/throat swelling, SOB or lightheadedness with hypotension: Yes Has patient had a PCN reaction causing severe rash involving mucus membranes or skin necrosis: No Has patient had a PCN reaction that required hospitalization No Has patient had a PCN reaction occurring within the last 10 years: No If all of the above answers are "NO", then may proceed with Cephalosporin use.      Medication List       Accurate as of December 17, 2018 11:59 PM. Always use your most recent med list.        aspirin 81 MG EC tablet Take 1 tablet (81 mg total) by mouth 2 (two) times daily with a meal.   bisacodyl 5 MG EC tablet Commonly known as:  DULCOLAX Take 1 tablet (5 mg total) by mouth daily as needed for moderate constipation.   carvedilol 3.125 MG tablet  Commonly known as:  COREG Take 1 tablet (3.125 mg total) by mouth 2 (two) times daily with a meal.   divalproex 125 MG DR tablet Commonly known as:  DEPAKOTE Take 125 mg by mouth daily.   feeding supplement Liqd Take 1 Container by mouth daily.   ferrous sulfate 325 (65 FE) MG tablet Take 325 mg by mouth daily with breakfast.   MAALOX ADVANCED PO Take by mouth. GIVE 47mL QID FOR INDIGESTION GIVE PRN   memantine 10 MG tablet Commonly known as:   NAMENDA Take 10 mg by mouth 2 (two) times daily.   mirtazapine 15 MG tablet Commonly known as:  REMERON Take 7.5 mg by mouth at bedtime. 1/2 tab   multivitamin tablet Take 1 tablet by mouth daily.   nitroGLYCERIN 0.4 MG/SPRAY spray Commonly known as:  NITROLINGUAL Place 1 spray under the tongue. Place 1 tab under the tongue every 5 mins PRN for chest pain ( MAX DOSE 3 TABS) notify provider if no relief   saccharomyces boulardii 250 MG capsule Commonly known as:  FLORASTOR Take 250 mg by mouth 2 (two) times daily.   traMADol 50 MG tablet Commonly known as:  ULTRAM Take 1 tablet (50 mg total) by mouth at bedtime.       No orders of the defined types were placed in this encounter.   Immunization History  Administered Date(s) Administered  . Influenza,inj,Quad PF,6+ Mos 07/19/2016  . Influenza-Unspecified 06/27/2017  . PPD Test 05/28/2016  . Pneumococcal Conjugate-13 07/11/2017    Social History   Tobacco Use  . Smoking status: Never Smoker  . Smokeless tobacco: Never Used  Substance Use Topics  . Alcohol use: No    Review of Systems  DATA OBTAINED: from patient-limited; nursing-no acute concerns GENERAL:  no fevers, fatigue, appetite changes SKIN: No itching, rash HEENT: No complaint RESPIRATORY: No cough, wheezing, SOB CARDIAC: No chest pain, palpitations, lower extremity edema  GI: No abdominal pain, No N/V/D or constipation, No heartburn or reflux  GU: No dysuria, frequency or urgency, or incontinence  MUSCULOSKELETAL: No unrelieved bone/joint pain NEUROLOGIC: No headache, dizziness  PSYCHIATRIC: No overt anxiety or sadness  Vitals:   12/17/18 0853  BP: 139/85  Pulse: (!) 104  Resp: 17  Temp: (!) 97.5 F (36.4 C)   Body mass index is 22 kg/m. Physical Exam  GENERAL APPEARANCE: Alert, conversant, No acute distress  SKIN: No diaphoresis rash HEENT: Unremarkable RESPIRATORY: Breathing is even, unlabored. Lung sounds are clear   CARDIOVASCULAR:  Heart RRR no murmurs, rubs or gallops. No peripheral edema  GASTROINTESTINAL: Abdomen is soft, non-tender, not distended w/ normal bowel sounds.  GENITOURINARY: Bladder non tender, not distended  MUSCULOSKELETAL: No abnormal joints or musculature NEUROLOGIC: Cranial nerves 2-12 grossly intact. Moves all extremities PSYCHIATRIC: Mood and affect dementia, no behavioral issues  Patient Active Problem List   Diagnosis Date Noted  . Osteoarthritis 09/09/2018  . Anxiety 07/25/2017  . 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 FTT (failure to thrive) in adult 08/04/2016  . Mood disorder (HCC) 07/28/2016  . Leukocytosis 07/17/2016  . Pressure injury of skin 07/17/2016  . Malnutrition of moderate degree 07/17/2016  . C. difficile diarrhea 07/07/2016  . Pedal edema 06/27/2016  . Postoperative anemia due to acute blood loss 06/10/2016  . Newly recognized heart murmur 06/10/2016  . Acute renal failure superimposed on stage 3 chronic kidney disease (HCC) 06/10/2016  . Chronic combined systolic and diastolic CHF (congestive heart failure) (HCC) 05/28/2016  . Protein-calorie malnutrition, severe  05/25/2016  . Left displaced femoral neck fracture (HCC) 05/25/2016  . Fall 05/24/2016  . Hypokalemia 05/24/2016  . AKI (acute kidney injury) (HCC) 05/24/2016  . Hypertensive heart disease with chronic combined systolic and diastolic congestive heart failure (HCC)   . Dementia with behavioral problem (HCC)   . Depression with anxiety   . HLD (hyperlipidemia)   . Dysphagia due to recent stroke 05/31/2015  . Hyperlipidemia 05/30/2015  . Vitamin D deficiency 05/30/2015  . Acute CVA (cerebrovascular accident) (HCC)   . Hx of arterial ischemic stroke 05/21/2015  . CKD (chronic kidney disease) stage 3, GFR 30-59 ml/min (HCC) 05/21/2015  . Dementia (HCC) 05/21/2015  . Hypertension 05/21/2015  . UTI (urinary tract infection) 05/25/2013  . Acute renal failure (HCC) 05/25/2013  . Syncope 05/24/2013     CMP     Component Value Date/Time   NA 143 07/20/2018   K 4.1 07/20/2018   CL 105 01/03/2017 1526   CO2 23 01/03/2017 1526   GLUCOSE 103 (H) 01/03/2017 1526   BUN 25 (A) 07/20/2018   CREATININE 1.6 (A) 07/20/2018   CREATININE 1.55 (H) 01/03/2017 1526   CALCIUM 9.2 01/03/2017 1526   PROT 6.9 01/03/2017 1526   ALBUMIN 3.2 (L) 01/03/2017 1526   AST 16 06/16/2018   ALT 7 06/16/2018   ALKPHOS 74 06/16/2018   BILITOT 1.1 01/03/2017 1526   GFRNONAA 27 (L) 01/03/2017 1526   GFRAA 31 (L) 01/03/2017 1526   Recent Labs    03/20/18 06/16/18 07/20/18  NA 144 147 143  K 4.8 5.1 4.1  BUN 27* 26* 25*  CREATININE 1.3* 1.5* 1.6*   Recent Labs    03/20/18 06/16/18  AST 19 16  ALT 16 7  ALKPHOS 85 74   Recent Labs    03/20/18 06/16/18  WBC 5.2 6.8  6.8  HGB 11.8* 11.7*  11.7*  HCT 36 35*  35*  PLT 220 201  201   Recent Labs    06/16/18  CHOL 191  LDLCALC 132  TRIG 79   [facility] results found for: [facility] Lab Results  Component Value Date   TSH 2.06 06/16/2018   Lab Results  Component Value Date   HGBA1C 5.9 06/16/2018   Lab Results  Component Value Date   CHOL 191 06/16/2018   HDL 43 06/16/2018   LDLCALC 132 06/16/2018   TRIG 79 06/16/2018   CHOLHDL 5.4 05/22/2015    Significant Diagnostic Results in last 30 days:  No results found.  Assessment and Plan  Hx of arterial ischemic stroke No new deficits or problems; continue ASA 81 mg daily  Hypertensive heart disease with chronic combined systolic and diastolic congestive heart failure (HCC) Chronic and stable; continue Coreg 3.125 mg twice daily  Chronic combined systolic and diastolic CHF (congestive heart failure) (HCC) No reported exacerbations; will follow patient for weight and symptoms; continue Coreg 3.125 mg twice daily; patient is not on a diuretic routinely     Thurston Holenne D. Lyn HollingsheadAlexander, MD

## 2018-12-19 ENCOUNTER — Encounter: Payer: Self-pay | Admitting: Internal Medicine

## 2018-12-19 NOTE — Assessment & Plan Note (Signed)
No reported exacerbations; will follow patient for weight and symptoms; continue Coreg 3.125 mg twice daily; patient is not on a diuretic routinely

## 2018-12-19 NOTE — Assessment & Plan Note (Signed)
Chronic and stable; continue Coreg 3.125 mg twice daily

## 2018-12-19 NOTE — Assessment & Plan Note (Signed)
No new deficits or problems; continue ASA 81 mg daily

## 2018-12-24 ENCOUNTER — Other Ambulatory Visit: Payer: Self-pay | Admitting: Internal Medicine

## 2018-12-24 MED ORDER — TRAMADOL HCL 50 MG PO TABS: 50.0000 mg | ORAL_TABLET | Freq: Every day | ORAL | 0 refills | Status: DC

## 2019-01-06 ENCOUNTER — Non-Acute Institutional Stay (SKILLED_NURSING_FACILITY): Payer: Medicare Other | Admitting: Internal Medicine

## 2019-01-06 DIAGNOSIS — G301 Alzheimer's disease with late onset: Secondary | ICD-10-CM

## 2019-01-06 DIAGNOSIS — F05 Delirium due to known physiological condition: Secondary | ICD-10-CM | POA: Diagnosis not present

## 2019-01-06 DIAGNOSIS — F02818 Dementia in other diseases classified elsewhere, unspecified severity, with other behavioral disturbance: Secondary | ICD-10-CM

## 2019-01-06 DIAGNOSIS — F0281 Dementia in other diseases classified elsewhere with behavioral disturbance: Secondary | ICD-10-CM

## 2019-01-09 ENCOUNTER — Encounter: Payer: Self-pay | Admitting: Internal Medicine

## 2019-01-09 NOTE — Progress Notes (Signed)
Location:  [facility]   Place of Service:  SNF (31)  Ariel Hanks, MD  Patient Care Team: Ariel Hanks, MD as PCP - General (Internal Medicine) Ariel Grippe, MD (Internal Medicine)  Extended Emergency Contact Information Primary Emergency Contact: Ariel Douglas Address: [address]          [place], [place] [address] [place] Home Phone: [phone] Mobile Phone: [phone] Relation: Daughter Secondary Emergency Contact: Ariel [place] Home Phone: [phone] Relation: Granddaughter    Allergies: Penicillins and Penicillins  Chief Complaint  Patient presents with  . Acute Visit    HPI: [name] who nursing asked me to see for behaviors.  Patient is reported to be up all night yelling and usually sleeping most of the day.  Nurse admits the behaviors usually begin to occur in early evening as an sundowning.  Patient is not known to be having trembling hallucinations or delusions.  Past Medical History:  Diagnosis Date  . Acute CVA (cerebrovascular accident) (HCC)   . Acute renal failure (HCC) 05/25/2013  . AKI (acute kidney injury) (HCC)   . CKD (chronic kidney disease) stage 3, GFR 30-59 ml/min (HCC) 05/21/2015  . Closed left hip fracture (HCC)   . Coronary artery disease   . Dementia (HCC)   . Depression with anxiety   . Dysphagia due to recent stroke 05/31/2015  . Fall   . HLD (hyperlipidemia)   . Hx of arterial ischemic stroke 05/21/2015  . Hyperlipidemia   . Hypertension   . Hypokalemia   . Left displaced femoral neck fracture (HCC) 05/25/2016  . Leukocytosis 07/17/2016  . Protein-calorie malnutrition, severe 05/25/2016  . Syncope 05/24/2013  . Transient ischemic attack (TIA)   . Vitamin D deficiency 05/30/2015    Past Surgical History:  Procedure Laterality Date  . ABDOMINAL HYSTERECTOMY    . ANTERIOR APPROACH HEMI HIP ARTHROPLASTY Left 05/25/2016   Procedure: ANTERIOR APPROACH HEMI HIP ARTHROPLASTY;  Surgeon: Ariel Frederic, MD;  Location: [facility];  Service: Orthopedics;  Laterality: Left;    Allergies as of 01/06/2019      Reactions   Penicillins Swelling   Facial and hand swelling per daughter Has patient had a PCN reaction causing immediate rash, facial/tongue/throat swelling, SOB or lightheadedness with hypotension: Yes Has patient had a PCN reaction causing severe rash involving mucus membranes or skin necrosis: No Has patient had a PCN reaction that required hospitalization No Has patient had a PCN reaction occurring within the last 10 years: No If all of the above answers are "NO", then may proceed with Cephalosporin use.   Penicillins Swelling   Has patient had a PCN reaction causing immediate rash, facial/tongue/throat swelling, SOB or lightheadedness with hypotension: Yes Has patient had a PCN reaction causing severe rash involving mucus membranes or skin necrosis: No Has patient had a PCN reaction that required hospitalization No Has patient had a PCN reaction occurring within the last 10 years: No If all of the above answers are "NO", then may proceed with Cephalosporin use.      Medication List       Accurate as of Jan 06, 2019 11:59 PM. If you have any questions, ask your nurse or doctor.        aspirin 81 MG EC tablet Take 1 tablet (81 mg total) by mouth 2 (two) times daily with a meal.   bisacodyl 5 MG EC tablet Commonly known as:  DULCOLAX Take 1 tablet (5 mg total) by mouth daily as needed for moderate constipation.   carvedilol 3.125 MG tablet Commonly known as:  COREG Take 1 tablet (3.125 mg total) by mouth 2 (two) times daily with a meal.   divalproex 125 MG DR tablet Commonly known as:  DEPAKOTE Take 125 mg by mouth daily.   feeding supplement Liqd Take 1 Container by mouth daily.   ferrous sulfate 325 (65 FE) MG tablet Take 325 mg by mouth daily with breakfast.   MAALOX ADVANCED PO Take by  mouth. GIVE 15mL QID FOR INDIGESTION GIVE PRN   memantine 10 MG tablet Commonly known as:  NAMENDA Take 10 mg by mouth 2 (two) times daily.   mirtazapine 15 MG tablet Commonly known as:  REMERON Take 7.5 mg by mouth at bedtime. 1/2 tab   multivitamin tablet Take 1 tablet by mouth daily.   nitroGLYCERIN 0.4 MG/SPRAY spray Commonly known as:  NITROLINGUAL Place 1 spray under the tongue. Place 1 tab under the tongue every 5 mins PRN for chest pain ( MAX DOSE 3 TABS) notify provider if no relief   saccharomyces boulardii 250 MG capsule Commonly known as:  FLORASTOR Take 250 mg by mouth 2 (two) times daily.   traMADol 50 MG tablet Commonly known as:  ULTRAM Take 1 tablet (50 mg total) by mouth at bedtime.       No orders of the defined types were placed in this encounter.   Immunization History  Administered Date(s) Administered  . Influenza,inj,Quad PF,6+ Mos 07/19/2016  . Influenza-Unspecified 06/27/2017  . PPD Test 05/28/2016  . Pneumococcal Conjugate-13 07/11/2017    Social History   Tobacco Use  . Smoking status: Never Smoker  . Smokeless tobacco: Never Used  Substance Use Topics  . Alcohol use: No    Review of Systems  DATA OBTAINED: from nurse GENERAL:  no fevers, fatigue, appetite changes SKIN: No itching, rash HEENT: No complaint RESPIRATORY: No cough, wheezing, SOB CARDIAC: No chest pain, palpitations, lower extremity edema  GI: No abdominal pain, No N/V/D or constipation, No heartburn or reflux  GU: No dysuria, frequency or urgency, or incontinence  MUSCULOSKELETAL: No unrelieved bone/joint pain NEUROLOGIC: No headache, dizziness  PSYCHIATRIC: No overt anxiety or sadness  Vitals:   01/09/19 1948  BP: 133/81  Pulse: (!) 106  Resp: (!) 21  Temp: 98.2 F (36.8 C)   Body mass index is 22 kg/m. Physical Exam  GENERAL APPEARANCE: Alert, conversant, No acute distress  SKIN: No diaphoresis rash HEENT: Unremarkable RESPIRATORY: Breathing is  even, unlabored. Lung sounds are clear   CARDIOVASCULAR: Heart RRR no murmurs, rubs or gallops. No peripheral edema  GASTROINTESTINAL: Abdomen is soft, non-tender, not distended w/ normal bowel sounds.  GENITOURINARY: Bladder non tender, not distended  MUSCULOSKELETAL: No abnormal joints or musculature NEUROLOGIC: Cranial nerves 2-12 grossly intact. Moves all extremities PSYCHIATRIC: Mood and affect dementia, no behavioral issues at this moment  Patient Active Problem List   Diagnosis Date Noted  . Osteoarthritis 09/09/2018  . Anxiety 07/25/2017  . 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 FTT (failure to thrive) in adult 08/04/2016  . Mood disorder (HCC) 07/28/2016  . Leukocytosis 07/17/2016  . Pressure injury of skin 07/17/2016  . Malnutrition of moderate degree 07/17/2016  . C. difficile diarrhea 07/07/2016  . Pedal edema 06/27/2016  . Postoperative anemia due to acute blood loss 06/10/2016  . Newly recognized heart murmur 06/10/2016  . Acute renal failure superimposed on stage 3 chronic kidney  disease (HCC) 06/10/2016  . Chronic combined systolic and diastolic CHF (congestive heart failure) (HCC) 05/28/2016  . Protein-calorie malnutrition, severe 05/25/2016  . Left displaced femoral neck fracture (HCC) 05/25/2016  . Fall 05/24/2016  . Hypokalemia 05/24/2016  . AKI (acute kidney injury) (HCC) 05/24/2016  . Hypertensive heart disease with chronic combined systolic and diastolic congestive heart failure (HCC)   . Dementia with behavioral problem (HCC)   . Depression with anxiety   . HLD (hyperlipidemia)   . Dysphagia due to recent stroke 05/31/2015  . Hyperlipidemia 05/30/2015  . Vitamin D deficiency 05/30/2015  . Acute CVA (cerebrovascular accident) (HCC)   . Hx of arterial ischemic stroke 05/21/2015  . CKD (chronic kidney disease) stage 3, GFR 30-59 ml/min (HCC) 05/21/2015  . Dementia (HCC) 05/21/2015  . Hypertension 05/21/2015  . UTI (urinary tract infection) 05/25/2013   . Acute renal failure (HCC) 05/25/2013  . Syncope 05/24/2013    CMP     Component Value Date/Time   NA 143 07/20/2018   K 4.1 07/20/2018   CL 105 01/03/2017 1526   CO2 23 01/03/2017 1526   GLUCOSE 103 (H) 01/03/2017 1526   BUN 25 (A) 07/20/2018   CREATININE 1.6 (A) 07/20/2018   CREATININE 1.55 (H) 01/03/2017 1526   CALCIUM 9.2 01/03/2017 1526   PROT 6.9 01/03/2017 1526   ALBUMIN 3.2 (L) 01/03/2017 1526   AST 16 06/16/2018   ALT 7 06/16/2018   ALKPHOS 74 06/16/2018   BILITOT 1.1 01/03/2017 1526   GFRNONAA 27 (L) 01/03/2017 1526   GFRAA 31 (L) 01/03/2017 1526   Recent Labs    03/20/18 06/16/18 07/20/18  NA 144 147 143  K 4.8 5.1 4.1  BUN 27* 26* 25*  CREATININE 1.3* 1.5* 1.6*   Recent Labs    03/20/18 06/16/18  AST 19 16  ALT 16 7  ALKPHOS 85 74   Recent Labs    03/20/18 06/16/18  WBC 5.2 6.8  6.8  HGB 11.8* 11.7*  11.7*  HCT 36 35*  35*  PLT 220 201  201   Recent Labs    06/16/18  CHOL 191  LDLCALC 132  TRIG 79   [facility] results found [facility]: [facility] Lab Results  Component Value Date   TSH 2.06 06/16/2018   Lab Results  Component Value Date   HGBA1C 5.9 06/16/2018   Lab Results  Component Value Date   CHOL 191 06/16/2018   HDL 43 06/16/2018   LDLCALC 132 06/16/2018   TRIG 79 06/16/2018   CHOLHDL 5.4 05/22/2015    Significant Diagnostic Results in last 30 days:  No results found.  Assessment and Plan  Dementia with behaviors/sundowning- patient is currently on Depakote 125 mg daily-will change that to Depakote 250 mg at 5 PM and clonazepam 5 mg p.o. nightly; will monitor for daytime sleepiness and adjust medications if needed    Merrilee SeashoreAnne Rolena Knutson, MD

## 2019-01-12 ENCOUNTER — Non-Acute Institutional Stay (SKILLED_NURSING_FACILITY): Payer: Medicare Other | Admitting: Internal Medicine

## 2019-01-12 ENCOUNTER — Encounter: Payer: Self-pay | Admitting: Internal Medicine

## 2019-01-12 DIAGNOSIS — F0281 Dementia in other diseases classified elsewhere with behavioral disturbance: Secondary | ICD-10-CM | POA: Diagnosis not present

## 2019-01-12 DIAGNOSIS — E785 Hyperlipidemia, unspecified: Secondary | ICD-10-CM | POA: Diagnosis not present

## 2019-01-12 DIAGNOSIS — G301 Alzheimer's disease with late onset: Secondary | ICD-10-CM

## 2019-01-12 DIAGNOSIS — I10 Essential (primary) hypertension: Secondary | ICD-10-CM

## 2019-01-12 DIAGNOSIS — F02818 Dementia in other diseases classified elsewhere, unspecified severity, with other behavioral disturbance: Secondary | ICD-10-CM

## 2019-01-12 NOTE — Progress Notes (Signed)
Location:  [facility] Room Number: 416-W Place of Service:  SNF (31)  Margit Hanks, MD  Patient Care Team: Margit Hanks, MD as PCP - General (Internal Medicine) [name], MD (Internal Medicine)  Extended Emergency Contact Information Primary Emergency Contact: [facility] Address: [address]          [place], [place] [address] [place] Home Phone: [phone] Mobile Phone: [phone] Relation: Daughter Secondary Emergency Contact: Asa [place] Home Phone: [phone] Relation: Granddaughter    Allergies: Penicillins and Penicillins  Chief Complaint  Patient presents with  . Medical Management of Chronic Issues    Routine [facility] visit    HPI: [name] who is being seen for routine issues of hypertension, dementia, and hyperlipidemia.  Past Medical History:  Diagnosis Date  . Acute CVA (cerebrovascular accident) (HCC)   . Acute renal failure (HCC) 05/25/2013  . AKI (acute kidney injury) (HCC)   . CKD (chronic kidney disease) stage 3, GFR 30-59 ml/min (HCC) 05/21/2015  . Closed left hip fracture (HCC)   . Coronary artery disease   . Dementia (HCC)   . Depression with anxiety   . Dysphagia due to recent stroke 05/31/2015  . Fall   . Hx of arterial ischemic stroke 05/21/2015  . Hyperlipidemia   . Hypertension   . Hypokalemia   . Left displaced femoral neck fracture (HCC) 05/25/2016  . Leukocytosis 07/17/2016  . Protein-calorie malnutrition, severe 05/25/2016  . Syncope 05/24/2013  . Transient ischemic attack (TIA)   . Vitamin D deficiency 05/30/2015    Past Surgical History:  Procedure Laterality Date  . ABDOMINAL HYSTERECTOMY    . ANTERIOR APPROACH HEMI HIP ARTHROPLASTY Left 05/25/2016   Procedure: ANTERIOR APPROACH HEMI HIP ARTHROPLASTY;  Surgeon: Samson Frederic, MD;  Location: [facility];  Service: Orthopedics;  Laterality: Left;     Allergies as of 01/12/2019      Reactions   Penicillins Swelling   Facial and hand swelling per daughter Has patient had a PCN reaction causing immediate rash, facial/tongue/throat swelling, SOB or lightheadedness with hypotension: Yes Has patient had a PCN reaction causing severe rash involving mucus membranes or skin necrosis: No Has patient had a PCN reaction that required hospitalization No Has patient had a PCN reaction occurring within the last 10 years: No If all of the above answers are "NO", then may proceed with Cephalosporin use.   Penicillins Swelling   Has patient had a PCN reaction causing immediate rash, facial/tongue/throat swelling, SOB or lightheadedness with hypotension: Yes Has patient had a PCN reaction causing severe rash involving mucus membranes or skin necrosis: No Has patient had a PCN reaction that required hospitalization No Has patient had a PCN reaction occurring within the last 10 years: No If all of the above answers are "NO", then may proceed with Cephalosporin use.      Medication List       Accurate as of Jan 12, 2019 11:59 PM. If you have any questions, ask your nurse or doctor.        aspirin 81 MG chewable tablet Chew 81 mg by mouth daily. What changed:  Another medication with the same name was removed. Continue taking this medication, and follow the directions you see here. Changed by:  Merrilee Seashore, MD   bisacodyl 5 MG EC tablet Commonly known as:  DULCOLAX Take 1 tablet (5 mg total) by mouth daily as  needed for moderate constipation.   carvedilol 3.125 MG tablet Commonly known as:  COREG Take 1 tablet (3.125 mg total) by mouth 2 (two) times daily with a meal.   clonazePAM 0.5 MG tablet Commonly known as:  KLONOPIN Take 0.5 mg by mouth at bedtime.   divalproex 125 MG DR tablet Commonly known as:  DEPAKOTE Take 250 mg by mouth daily. Take 2 capsules to = 250 mg at 5PM   ENSURE ENLIVE PO Take 1 Can by mouth 2 (two) times a day.  What changed:  Another medication with the same name was removed. Continue taking this medication, and follow the directions you see here. Changed by:  Merrilee Seashore, MD   ferrous sulfate 325 (65 FE) MG tablet Take 325 mg by mouth daily with breakfast.   MAALOX ADVANCED PO Take 15 mLs by mouth 4 (four) times daily as needed (Indigestion).   memantine 10 MG tablet Commonly known as:  NAMENDA Take 10 mg by mouth 2 (two) times daily.   mirtazapine 15 MG tablet Commonly known as:  REMERON Take 7.5 mg by mouth at bedtime. 0.5 tablet to = 7.5 mg   multivitamin tablet Take 1 tablet by mouth daily.   nitroGLYCERIN 0.4 MG/SPRAY spray Commonly known as:  NITROLINGUAL Place 1 spray under the tongue. Place 1 tab under the tongue every 5 mins PRN for chest pain ( MAX DOSE 3 TABS) notify provider if no relief   saccharomyces boulardii 250 MG capsule Commonly known as:  FLORASTOR Take 250 mg by mouth 2 (two) times daily.   traMADol 50 MG tablet Commonly known as:  ULTRAM Take 1 tablet (50 mg total) by mouth at bedtime.       No orders of the defined types were placed in this encounter.   Immunization History  Administered Date(s) Administered  . Influenza,inj,Quad PF,6+ Mos 07/19/2016  . Influenza-Unspecified 06/27/2017  . PPD Test 05/28/2016  . Pneumococcal Conjugate-13 07/11/2017    Social History   Tobacco Use  . Smoking status: Never Smoker  . Smokeless tobacco: Never Used  Substance Use Topics  . Alcohol use: No    Review of Systems  DATA OBTAINED: from patient.  Nursing GENERAL:  no fevers, fatigue, appetite changes SKIN: No itching, rash HEENT: No complaint RESPIRATORY: No cough, wheezing, SOB CARDIAC: No chest pain, palpitations, lower extremity edema  GI: No abdominal pain, No N/V/D or constipation, No heartburn or reflux  GU: No dysuria, frequency or urgency, or incontinence  MUSCULOSKELETAL: No unrelieved bone/joint pain NEUROLOGIC: No headache,  dizziness  PSYCHIATRIC: No overt anxiety or sadness  Vitals:   01/12/19 1052  BP: 125/71  Pulse: 79  Resp: 20  Temp: (!) 97.3 F (36.3 C)  SpO2: 95%   Body mass index is 22 kg/m. Physical Exam  GENERAL APPEARANCE: Alert, conversant, No acute distress  SKIN: No diaphoresis rash HEENT: Unremarkable RESPIRATORY: Breathing is even, unlabored. Lung sounds are clear   CARDIOVASCULAR: Heart RRR no murmurs, rubs or gallops. No peripheral edema  GASTROINTESTINAL: Abdomen is soft, non-tender, not distended w/ normal bowel sounds.  GENITOURINARY: Bladder non tender, not distended  MUSCULOSKELETAL: No abnormal joints or musculature NEUROLOGIC: Cranial nerves 2-12 grossly intact. Moves all extremities PSYCHIATRIC: Mood and affect appropriate to situation, no behavioral issues  Patient Active Problem List   Diagnosis Date Noted  . Osteoarthritis 09/09/2018  . Anxiety 07/25/2017  . 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 FTT (failure to thrive) in adult 08/04/2016  . Mood disorder (HCC) 07/28/2016  .  Leukocytosis 07/17/2016  . Pressure injury of skin 07/17/2016  . Malnutrition of moderate degree 07/17/2016  . C. difficile diarrhea 07/07/2016  . Pedal edema 06/27/2016  . Postoperative anemia due to acute blood loss 06/10/2016  . Newly recognized heart murmur 06/10/2016  . Acute renal failure superimposed on stage 3 chronic kidney disease (HCC) 06/10/2016  . Chronic combined systolic and diastolic CHF (congestive heart failure) (HCC) 05/28/2016  . Protein-calorie malnutrition, severe 05/25/2016  . Left displaced femoral neck fracture (HCC) 05/25/2016  . Fall 05/24/2016  . Hypokalemia 05/24/2016  . AKI (acute kidney injury) (HCC) 05/24/2016  . Hypertensive heart disease with chronic combined systolic and diastolic congestive heart failure (HCC)   . Dementia with behavioral problem (HCC)   . Depression with anxiety   . HLD (hyperlipidemia)   . Dysphagia due to recent stroke  05/31/2015  . Hyperlipidemia 05/30/2015  . Vitamin D deficiency 05/30/2015  . Acute CVA (cerebrovascular accident) (HCC)   . Hx of arterial ischemic stroke 05/21/2015  . CKD (chronic kidney disease) stage 3, GFR 30-59 ml/min (HCC) 05/21/2015  . Dementia (HCC) 05/21/2015  . Hypertension 05/21/2015  . UTI (urinary tract infection) 05/25/2013  . Acute renal failure (HCC) 05/25/2013  . Syncope 05/24/2013    CMP     Component Value Date/Time   NA 143 07/20/2018   K 4.1 07/20/2018   CL 105 01/03/2017 1526   CO2 23 01/03/2017 1526   GLUCOSE 103 (H) 01/03/2017 1526   BUN 25 (A) 07/20/2018   CREATININE 1.6 (A) 07/20/2018   CREATININE 1.55 (H) 01/03/2017 1526   CALCIUM 9.2 01/03/2017 1526   PROT 6.9 01/03/2017 1526   ALBUMIN 3.2 (L) 01/03/2017 1526   AST 16 06/16/2018   ALT 7 06/16/2018   ALKPHOS 74 06/16/2018   BILITOT 1.1 01/03/2017 1526   GFRNONAA 27 (L) 01/03/2017 1526   GFRAA 31 (L) 01/03/2017 1526   Recent Labs    03/20/18 06/16/18 07/20/18  NA 144 147 143  K 4.8 5.1 4.1  BUN 27* 26* 25*  CREATININE 1.3* 1.5* 1.6*   Recent Labs    03/20/18 06/16/18  AST 19 16  ALT 16 7  ALKPHOS 85 74   Recent Labs    03/20/18 06/16/18  WBC 5.2 6.8  6.8  HGB 11.8* 11.7*  11.7*  HCT 36 35*  35*  PLT 220 201  201   Recent Labs    06/16/18  CHOL 191  LDLCALC 132  TRIG 79   No results found for: [facility] Lab Results  Component Value Date   TSH 2.06 06/16/2018   Lab Results  Component Value Date   HGBA1C 5.9 06/16/2018   Lab Results  Component Value Date   CHOL 191 06/16/2018   HDL 43 06/16/2018   LDLCALC 132 06/16/2018   TRIG 79 06/16/2018   CHOLHDL 5.4 05/22/2015    Significant Diagnostic Results in last 30 days:  No results found.  Assessment and Plan  Hypertension Controlled for age; continue Coreg 3.125 mg p.o. twice daily  Dementia with behavioral problem (HCC) Chronic and progressive; continue Namenda 10 mg p.o. twice daily as well as Depakote  125 mg twice daily for behaviors  HLD (hyperlipidemia) LDL 132, HDL 43, on no meds secondary to age and valuables very reasonable for age; continue supportive care     [name], MD

## 2019-01-14 ENCOUNTER — Encounter: Payer: Self-pay | Admitting: Internal Medicine

## 2019-01-14 NOTE — Assessment & Plan Note (Signed)
LDL 132, HDL 43, on no meds secondary to age and valuables very reasonable for age; continue supportive care

## 2019-01-14 NOTE — Assessment & Plan Note (Signed)
Chronic and progressive; continue Namenda 10 mg p.o. twice daily as well as Depakote 125 mg twice daily for behaviors

## 2019-01-14 NOTE — Assessment & Plan Note (Signed)
Controlled for age; continue Coreg 3.125 mg p.o. twice daily

## 2019-01-20 ENCOUNTER — Other Ambulatory Visit: Payer: Self-pay | Admitting: Internal Medicine

## 2019-01-20 MED ORDER — CLONAZEPAM 0.5 MG PO TABS: 0.5000 mg | ORAL_TABLET | Freq: Every day | ORAL | 0 refills | Status: DC

## 2019-02-01 ENCOUNTER — Encounter: Payer: Self-pay | Admitting: Internal Medicine

## 2019-02-01 NOTE — Progress Notes (Signed)
Location:      Place of Service:     Margit Hanks, MD  Patient Care Team: Margit Hanks, MD as PCP - General (Internal Medicine) [name], MD (Internal Medicine)  Extended Emergency Contact Information Primary Emergency Contact: [facility] Address: [address]          [place], [place] [address] [place] Home Phone: [phone] Mobile Phone: [phone] Relation: Daughter Secondary Emergency Contact: Asa Saunas [place] Home Phone: [phone] Relation: Granddaughter    Allergies: Penicillins and Penicillins  No chief complaint on file.   HPI: [name] who   Past Medical History:  Diagnosis Date  . Acute CVA (cerebrovascular accident) (HCC)   . Acute renal failure (HCC) 05/25/2013  . AKI (acute kidney injury) (HCC)   . CKD (chronic kidney disease) stage 3, GFR 30-59 ml/min (HCC) 05/21/2015  . Closed left hip fracture (HCC)   . Coronary artery disease   . Dementia (HCC)   . Depression with anxiety   . Dysphagia due to recent stroke 05/31/2015  . Fall   . Hx of arterial ischemic stroke 05/21/2015  . Hyperlipidemia   . Hypertension   . Hypokalemia   . Left displaced femoral neck fracture (HCC) 05/25/2016  . Leukocytosis 07/17/2016  . Protein-calorie malnutrition, severe 05/25/2016  . Syncope 05/24/2013  . Transient ischemic attack (TIA)   . Vitamin D deficiency 05/30/2015    Past Surgical History:  Procedure Laterality Date  . ABDOMINAL HYSTERECTOMY    . ANTERIOR APPROACH HEMI HIP ARTHROPLASTY Left 05/25/2016   Procedure: ANTERIOR APPROACH HEMI HIP ARTHROPLASTY;  Surgeon: Samson Frederic, MD;  Location: [facility];  Service: Orthopedics;  Laterality: Left;    Allergies as of 02/01/2019      Reactions   Penicillins Swelling   Facial and hand swelling per daughter Has patient had a PCN reaction causing immediate rash, facial/tongue/throat swelling, SOB or lightheadedness with  hypotension: Yes Has patient had a PCN reaction causing severe rash involving mucus membranes or skin necrosis: No Has patient had a PCN reaction that required hospitalization No Has patient had a PCN reaction occurring within the last 10 years: No If all of the above answers are "NO", then may proceed with Cephalosporin use.   Penicillins Swelling   Has patient had a PCN reaction causing immediate rash, facial/tongue/throat swelling, SOB or lightheadedness with hypotension: Yes Has patient had a PCN reaction causing severe rash involving mucus membranes or skin necrosis: No Has patient had a PCN reaction that required hospitalization No Has patient had a PCN reaction occurring within the last 10 years: No If all of the above answers are "NO", then may proceed with Cephalosporin use.      Medication List       Accurate as of February 01, 2019 12:48 PM. If you have any questions, ask your nurse or doctor.        aspirin 81 MG chewable tablet Chew 81 mg by mouth daily.   bisacodyl 5 MG EC tablet Commonly known as:  DULCOLAX Take 1 tablet (5 mg total) by mouth daily as needed for moderate constipation.   carvedilol 3.125 MG tablet Commonly known as:  COREG Take 1 tablet (3.125 mg total) by mouth 2 (two) times daily with a meal.   clonazePAM 0.5 MG tablet Commonly known as:  KLONOPIN Take 1 tablet (0.5 mg total) by mouth at bedtime.   divalproex 125 MG DR tablet Commonly known as:  DEPAKOTE Take 250 mg by mouth daily. Take 2 capsules to = 250 mg at 5PM   ENSURE ENLIVE PO Take 1 Can by mouth 2 (two) times a day.   ferrous sulfate 325 (65 FE) MG tablet Take 325 mg by mouth daily with breakfast.   MAALOX ADVANCED PO Take 15 mLs by mouth 4 (four) times daily as needed (Indigestion).   memantine 10 MG tablet Commonly known as:  NAMENDA Take 10 mg by mouth 2 (two) times daily.   mirtazapine 15 MG tablet Commonly known as:  REMERON Take 7.5 mg by mouth at bedtime. 0.5 tablet to  = 7.5 mg   multivitamin tablet Take 1 tablet by mouth daily.   nitroGLYCERIN 0.4 MG/SPRAY spray Commonly known as:  NITROLINGUAL Place 1 spray under the tongue. Place 1 tab under the tongue every 5 mins PRN for chest pain ( MAX DOSE 3 TABS) notify provider if no relief   saccharomyces boulardii 250 MG capsule Commonly known as:  FLORASTOR Take 250 mg by mouth 2 (two) times daily.   traMADol 50 MG tablet Commonly known as:  ULTRAM Take 1 tablet (50 mg total) by mouth at bedtime.       No orders of the defined types were placed in this encounter.   Immunization History  Administered Date(s) Administered  . Influenza,inj,Quad PF,6+ Mos 07/19/2016  . Influenza-Unspecified 06/27/2017  . PPD Test 05/28/2016  . Pneumococcal Conjugate-13 07/11/2017    Social History   Tobacco Use  . Smoking status: Never Smoker  . Smokeless tobacco: Never Used  Substance Use Topics  . Alcohol use: No    Review of Systems  DATA OBTAINED: from patient, nurse, medical record, family member GENERAL:  no fevers, fatigue, appetite changes SKIN: No itching, rash HEENT: No complaint RESPIRATORY: No cough, wheezing, SOB CARDIAC: No chest pain, palpitations, lower extremity edema  GI: No abdominal pain, No N/V/D or constipation, No heartburn or reflux  GU: No dysuria, frequency or urgency, or incontinence  MUSCULOSKELETAL: No unrelieved bone/joint pain NEUROLOGIC: No headache, dizziness  PSYCHIATRIC: No overt anxiety or sadness  There were no vitals filed for this visit. There is no height or weight on file to calculate BMI. Physical Exam  GENERAL APPEARANCE: Alert, conversant, No acute distress  SKIN: No diaphoresis rash HEENT: Unremarkable RESPIRATORY: Breathing is even, unlabored. Lung sounds are clear   CARDIOVASCULAR: Heart RRR no murmurs, rubs or gallops. No peripheral edema  GASTROINTESTINAL: Abdomen is soft, non-tender, not distended w/ normal bowel sounds.  GENITOURINARY:  Bladder non tender, not distended  MUSCULOSKELETAL: No abnormal joints or musculature NEUROLOGIC: Cranial nerves 2-12 grossly intact. Moves all extremities PSYCHIATRIC: Mood and affect appropriate to situation, no behavioral issues  Patient Active Problem List   Diagnosis Date Noted  . Osteoarthritis 09/09/2018  . Anxiety 07/25/2017  . 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 FTT (failure to thrive) in adult 08/04/2016  . Mood disorder (HCC) 07/28/2016  . Leukocytosis 07/17/2016  . Pressure injury of skin 07/17/2016  . Malnutrition of moderate degree 07/17/2016  . C. difficile diarrhea 07/07/2016  . Pedal edema 06/27/2016  . Postoperative anemia due to acute blood loss 06/10/2016  . Newly recognized heart murmur 06/10/2016  . Acute renal failure superimposed on stage 3 chronic kidney disease (HCC) 06/10/2016  . Chronic combined systolic and diastolic CHF (congestive heart failure) (HCC) 05/28/2016  . Protein-calorie malnutrition, severe 05/25/2016  . Left displaced femoral neck fracture (HCC) 05/25/2016  . Fall 05/24/2016  . Hypokalemia 05/24/2016  .  AKI (acute kidney injury) (HCC) 05/24/2016  . Hypertensive heart disease with chronic combined systolic and diastolic congestive heart failure (HCC)   . Dementia with behavioral problem (HCC)   . Depression with anxiety   . HLD (hyperlipidemia)   . Dysphagia due to recent stroke 05/31/2015  . Hyperlipidemia 05/30/2015  . Vitamin D deficiency 05/30/2015  . Acute CVA (cerebrovascular accident) (HCC)   . Hx of arterial ischemic stroke 05/21/2015  . CKD (chronic kidney disease) stage 3, GFR 30-59 ml/min (HCC) 05/21/2015  . Dementia (HCC) 05/21/2015  . Hypertension 05/21/2015  . UTI (urinary tract infection) 05/25/2013  . Acute renal failure (HCC) 05/25/2013  . Syncope 05/24/2013    CMP     Component Value Date/Time   NA 143 07/20/2018   K 4.1 07/20/2018   CL 105 01/03/2017 1526   CO2 23 01/03/2017 1526   GLUCOSE 103 (H)  01/03/2017 1526   BUN 25 (A) 07/20/2018   CREATININE 1.6 (A) 07/20/2018   CREATININE 1.55 (H) 01/03/2017 1526   CALCIUM 9.2 01/03/2017 1526   PROT 6.9 01/03/2017 1526   ALBUMIN 3.2 (L) 01/03/2017 1526   AST 16 06/16/2018   ALT 7 06/16/2018   ALKPHOS 74 06/16/2018   BILITOT 1.1 01/03/2017 1526   GFRNONAA 27 (L) 01/03/2017 1526   GFRAA 31 (L) 01/03/2017 1526   Recent Labs    03/20/18 06/16/18 07/20/18  NA 144 147 143  K 4.8 5.1 4.1  BUN 27* 26* 25*  CREATININE 1.3* 1.5* 1.6*   Recent Labs    03/20/18 06/16/18  AST 19 16  ALT 16 7  ALKPHOS 85 74   Recent Labs    03/20/18 06/16/18  WBC 5.2 6.8  6.8  HGB 11.8* 11.7*  11.7*  HCT 36 35*  35*  PLT 220 201  201   Recent Labs    06/16/18  CHOL 191  LDLCALC 132  TRIG 79   No results found for: [facility] Lab Results  Component Value Date   TSH 2.06 06/16/2018   Lab Results  Component Value Date   HGBA1C 5.9 06/16/2018   Lab Results  Component Value Date   CHOL 191 06/16/2018   HDL 43 06/16/2018   LDLCALC 132 06/16/2018   TRIG 79 06/16/2018   CHOLHDL 5.4 05/22/2015    Significant Diagnostic Results in last 30 days:  No results found.  Assessment and Plan  No problem-specific Assessment & Plan notes found for this encounter.   Labs/tests ordered:    Merrilee Seashore, MD   This encounter was created in error - please disregard.

## 2019-02-11 ENCOUNTER — Encounter: Payer: Self-pay | Admitting: Internal Medicine

## 2019-02-11 ENCOUNTER — Non-Acute Institutional Stay (SKILLED_NURSING_FACILITY): Payer: Medicare Other | Admitting: Internal Medicine

## 2019-02-11 DIAGNOSIS — I110 Hypertensive heart disease with heart failure: Secondary | ICD-10-CM | POA: Diagnosis not present

## 2019-02-11 DIAGNOSIS — I5042 Chronic combined systolic (congestive) and diastolic (congestive) heart failure: Secondary | ICD-10-CM | POA: Diagnosis not present

## 2019-02-11 DIAGNOSIS — Z8673 Personal history of transient ischemic attack (TIA), and cerebral infarction without residual deficits: Secondary | ICD-10-CM

## 2019-02-11 NOTE — Progress Notes (Signed)
Location:  [facility] Room Number: 416-W Place of Service:  SNF (31)  Hennie Duos, MD  Patient Care Team: Hennie Duos, MD as PCP - General (Internal Medicine) Jani Gravel, MD (Internal Medicine)  Extended Emergency Contact Information Primary Emergency Contact: [facility] Address: [address]          [place],  [address] [place] Phone: [phone] Mobile Phone: [phone] Relation: Daughter Secondary Emergency Contact: Ledell Noss [place] Phone: [phone] Relation: Granddaughter   Chief Complaint  Patient presents with  . Medical Management of Chronic Issues    Routine visit of madical management  . Immunizations    PPSV-23, T-Dap    HPI:  Pt is a 85 y.o. female seen today for routine issues of history of CVA, hypertension, and chronic diastolic and diastolic congestive heart failure.   Past Medical History:  Diagnosis Date  . Acute CVA (cerebrovascular accident) ([place])   . Acute renal failure ([place]) 05/25/2013  . AKI (acute kidney injury) ([place])   . CKD (chronic kidney disease) stage 3, GFR 30-59 ml/min (HCC) 05/21/2015  . Closed left hip fracture ([place])   . Coronary artery disease   . Dementia ([place])   . Depression with anxiety   . Dysphagia due to recent stroke 05/31/2015  . Fall   . Hx of arterial ischemic stroke 05/21/2015  . Hyperlipidemia   . Hypertension   . Hypokalemia   . Left displaced femoral neck fracture ([place]) 05/25/2016  . Leukocytosis 07/17/2016  . Protein-calorie malnutrition, severe 05/25/2016  . Syncope 05/24/2013  . Transient ischemic attack (TIA)   . Vitamin D deficiency 05/30/2015   Past Surgical History:  Procedure Laterality Date  . ABDOMINAL HYSTERECTOMY    . ANTERIOR APPROACH HEMI HIP ARTHROPLASTY Left 05/25/2016   Procedure: ANTERIOR APPROACH HEMI HIP ARTHROPLASTY;  Surgeon: Rod Can, MD;  Location: [facility];   Service: Orthopedics;  Laterality: Left;    Allergies  Allergen Reactions  . Penicillins Swelling    Facial and hand swelling per daughter Has patient had a PCN reaction causing immediate rash, facial/tongue/throat swelling, SOB or lightheadedness with hypotension: Yes Has patient had a PCN reaction causing severe rash involving mucus membranes or skin necrosis: No Has patient had a PCN reaction that required hospitalization No Has patient had a PCN reaction occurring within the last 10 years: No If all of the above answers are "NO", then may proceed with Cephalosporin use.  Marland Kitchen Penicillins Swelling    Has patient had a PCN reaction causing immediate rash, facial/tongue/throat swelling, SOB or lightheadedness with hypotension: Yes Has patient had a PCN reaction causing severe rash involving mucus membranes or skin necrosis: No Has patient had a PCN reaction that required hospitalization No Has patient had a PCN reaction occurring within the last 10 years: No If all of the above answers are "NO", then may proceed with Cephalosporin use.     Allergies as of 02/11/2019      Reactions   Penicillins Swelling   Facial and hand swelling per daughter Has patient had a PCN reaction causing immediate rash, facial/tongue/throat swelling, SOB or lightheadedness with hypotension: Yes Has patient had a PCN reaction causing severe rash involving mucus membranes or skin necrosis: No Has patient had a PCN reaction that required hospitalization No Has patient had a PCN reaction occurring within the last 10 years: No If all of the above answers are "NO", then may proceed  with Cephalosporin use.   Penicillins Swelling   Has patient had a PCN reaction causing immediate rash, facial/tongue/throat swelling, SOB or lightheadedness with hypotension: Yes Has patient had a PCN reaction causing severe rash involving mucus membranes or skin necrosis: No Has patient had a PCN reaction that required hospitalization  No Has patient had a PCN reaction occurring within the last 10 years: No If all of the above answers are "NO", then may proceed with Cephalosporin use.      Medication List       Accurate as of February 11, 2019 11:59 PM. If you have any questions, ask your nurse or doctor.        STOP taking these medications   clonazePAM 0.5 MG tablet Commonly known as: KLONOPIN Stopped by: Merrilee SeashoreAnne Marifer Hurd, MD   saccharomyces boulardii 250 MG capsule Commonly known as: FLORASTOR Stopped by: Merrilee SeashoreAnne Drayce Tawil, MD     TAKE these medications   aspirin 81 MG chewable tablet Chew 81 mg by mouth daily.   bisacodyl 5 MG EC tablet Commonly known as: DULCOLAX Take 1 tablet (5 mg total) by mouth daily as needed for moderate constipation.   carvedilol 3.125 MG tablet Commonly known as: COREG Take 1 tablet (3.125 mg total) by mouth 2 (two) times daily with a meal.   divalproex 125 MG DR tablet Commonly known as: DEPAKOTE TAKE 1 CAPSULE BY MOUTH AT BEDTIME (DO NOT CRUSH)   divalproex 125 MG DR tablet Commonly known as: DEPAKOTE TAKE 1 CAPSULE BY MOUTH EVERY MORNING (DO NOT CRUSH)   divalproex 125 MG DR tablet Commonly known as: DEPAKOTE TAKE 2 CAPSULES (250MG ) BY MOUTH AT 3PM (DO NOT CRUSH)   ENSURE ENLIVE PO Take 1 Can by mouth 2 (two) times a day.   ferrous sulfate 325 (65 FE) MG tablet Take 325 mg by mouth daily with breakfast.   MAALOX ADVANCED PO Take 15 mLs by mouth 4 (four) times daily as needed (Indigestion).   memantine 10 MG tablet Commonly known as: NAMENDA Take 10 mg by mouth 2 (two) times daily.   mirtazapine 15 MG tablet Commonly known as: REMERON Take 7.5 mg by mouth at bedtime. 0.5 tablet to = 7.5 mg   multivitamin tablet Take 1 tablet by mouth daily.   nitroGLYCERIN 0.4 MG/SPRAY spray Commonly known as: NITROLINGUAL Place 1 spray under the tongue. Place 1 tab under the tongue every 5 mins PRN for chest pain ( MAX DOSE 3 TABS) notify provider if no relief   traMADol  50 MG tablet Commonly known as: ULTRAM Take 1 tablet (50 mg total) by mouth at bedtime.       Review of Systems  DATA OBTAINED: from patient, nurse GENERAL:  no fevers, fatigue, appetite changes SKIN: No itching, rash HEENT: No complaint RESPIRATORY: No cough, wheezing, SOB CARDIAC: No chest pain, palpitations, lower extremity edema  GI: No abdominal pain, No N/V/D or constipation, No heartburn or reflux  GU: No dysuria, frequency or urgency, or incontinence  MUSCULOSKELETAL: No unrelieved bone/joint pain NEUROLOGIC: No headache, dizziness  PSYCHIATRIC: No overt anxiety or sadness   Immunization History  Administered Date(s) Administered  . Influenza,inj,Quad PF,6+ Mos 07/19/2016  . Influenza-Unspecified 06/27/2017  . PPD Test 05/28/2016  . Pneumococcal Conjugate-13 07/11/2017   Pertinent  Health Maintenance Due  Topic Date Due  . PNA vac Low Risk Adult (2 of 2 - PPSV23) 03/13/2019 (Originally 07/11/2018)  . INFLUENZA VACCINE  04/03/2019  . DEXA SCAN  Discontinued   Fall Risk  03/27/2018 03/26/2017  Falls in the past year? No No    Vitals:   02/11/19 1105  BP: 130/83  Pulse: 91  Resp: 18  Temp: (!) 96.9 F (36.1 C)  TempSrc: Oral  SpO2: 94%  Weight: 123 lb 6.4 oz (56 kg)  Height: 5\' 3"  (1.6 m)   Body mass index is 21.86 kg/m.  Physical Exam  GENERAL APPEARANCE: Alert, conversant, No acute distress  SKIN: No diaphoresis rash HEENT: Unremarkable RESPIRATORY: Breathing is even, unlabored. Lung sounds are clear   CARDIOVASCULAR: Heart RRR no murmurs, rubs or gallops. No peripheral edema  GASTROINTESTINAL: Abdomen is soft, non-tender, not distended w/ normal bowel sounds.  GENITOURINARY: Bladder non tender, not distended  MUSCULOSKELETAL: No abnormal joints or musculature NEUROLOGIC: Cranial nerves 2-12 grossly intact. Moves all extremities PSYCHIATRIC: Mood and affect appropriate to situation, no behavioral issues  Labs reviewed: Recent Labs    03/20/18  06/16/18 07/20/18  NA 144 147 143  K 4.8 5.1 4.1  BUN 27* 26* 25*  CREATININE 1.3* 1.5* 1.6*   Recent Labs    03/20/18 06/16/18  AST 19 16  ALT 16 7  ALKPHOS 85 74   Recent Labs    03/20/18 06/16/18  WBC 5.2 6.8  6.8  HGB 11.8* 11.7*  11.7*  HCT 36 35*  35*  PLT 220 201  201   Recent Labs    06/16/18  CHOL 191  LDLCALC 132  TRIG 79   [facility]: [facility] Lab Results  Component Value Date   TSH 2.06 06/16/2018   Lab Results  Component Value Date   HGBA1C 5.9 06/16/2018   Lab Results  Component Value Date   CHOL 191 06/16/2018   HDL 43 06/16/2018   LDLCALC 132 06/16/2018   TRIG 79 06/16/2018   CHOLHDL 5.4 05/22/2015    Significant Diagnostic Results in last 30 days:  No results found.  Assessment/Plan Hx of arterial ischemic stroke No new reported problems; continue ASA 81 mg daily  Hypertensive heart disease with chronic combined systolic and diastolic congestive heart failure (HCC) Stable; continue Coreg 3.125 mg twice daily  Chronic combined systolic and diastolic CHF (congestive heart failure) (HCC) No reported exacerbations; continue Coreg 3.125 mg twice daily; patient is not on diuretic, weights are followed    Randon GoldsmithAnne D. Reinhold Rickey MD.

## 2019-02-14 ENCOUNTER — Encounter: Payer: Self-pay | Admitting: Internal Medicine

## 2019-02-14 NOTE — Assessment & Plan Note (Signed)
No reported exacerbations; continue Coreg 3.125 mg twice daily; patient is not on diuretic, weights are followed

## 2019-02-14 NOTE — Assessment & Plan Note (Signed)
No new reported problems; continue ASA 81 mg daily

## 2019-02-14 NOTE — Assessment & Plan Note (Signed)
Stable; continue Coreg 3.125 mg twice daily

## 2019-03-15 ENCOUNTER — Non-Acute Institutional Stay (SKILLED_NURSING_FACILITY): Payer: Medicare Other | Admitting: Internal Medicine

## 2019-03-15 DIAGNOSIS — F02818 Dementia in other diseases classified elsewhere, unspecified severity, with other behavioral disturbance: Secondary | ICD-10-CM

## 2019-03-15 DIAGNOSIS — F0281 Dementia in other diseases classified elsewhere with behavioral disturbance: Secondary | ICD-10-CM | POA: Diagnosis not present

## 2019-03-15 DIAGNOSIS — G301 Alzheimer's disease with late onset: Secondary | ICD-10-CM | POA: Diagnosis not present

## 2019-03-15 DIAGNOSIS — R627 Adult failure to thrive: Secondary | ICD-10-CM | POA: Diagnosis not present

## 2019-03-20 ENCOUNTER — Encounter: Payer: Self-pay | Admitting: Internal Medicine

## 2019-03-20 NOTE — Progress Notes (Signed)
Location:  [facility]   Place of Service:   SNF  Margit HanksAlexander, Pamla Pangle D, MD  Patient Care Team: Margit HanksAlexander, Fausto Sampedro D, MD as PCP - General (Internal Medicine) Pearson GrippeKim, James, MD (Internal Medicine)  Extended Emergency Contact Information Primary Emergency Contact: [facility] Address: [address]          [place], [place] [address] [place] Home Phone: [phone] Mobile Phone: [phone] Relation: Daughter Secondary Emergency Contact: Asa Saunasliver,Cynthia  [place] Home Phone: [phone] Relation: Granddaughter    Allergies: Penicillins and Penicillins  Chief Complaint  Patient presents with   Acute Visit    HPI: [name] who has been failing to thrive.  Patient continues to eat and drink very little and the nurses of asked me to look at it 1 last time patient is an optimum patient and I suspect acquaintance about this as well.  Patient has had no fever chills nausea vomiting diarrhea sore throat or any other symptom.  Past Medical History:  Diagnosis Date   Acute CVA (cerebrovascular accident) (HCC)    Acute renal failure (HCC) 05/25/2013   AKI (acute kidney injury) (HCC)    CKD (chronic kidney disease) stage 3, GFR 30-59 ml/min (HCC) 05/21/2015   Closed left hip fracture (HCC)    Coronary artery disease    Dementia (HCC)    Depression with anxiety    Dysphagia due to recent stroke 05/31/2015   Fall    Hx of arterial ischemic stroke 05/21/2015   Hyperlipidemia    Hypertension    Hypokalemia    Left displaced femoral neck fracture (HCC) 05/25/2016   Leukocytosis 07/17/2016   Protein-calorie malnutrition, severe 05/25/2016   Syncope 05/24/2013   Transient ischemic attack (TIA)    Vitamin D deficiency 05/30/2015    Past Surgical History:  Procedure Laterality Date   ABDOMINAL HYSTERECTOMY     ANTERIOR APPROACH HEMI HIP ARTHROPLASTY Left 05/25/2016   Procedure:  ANTERIOR APPROACH HEMI HIP ARTHROPLASTY;  Surgeon: Samson FredericBrian Swinteck, MD;  Location: [facility];  Service: Orthopedics;  Laterality: Left;    Allergies as of 03/15/2019      Reactions   Penicillins Swelling   Facial and hand swelling per daughter Has patient had a PCN reaction causing immediate rash, facial/tongue/throat swelling, SOB or lightheadedness with hypotension: Yes Has patient had a PCN reaction causing severe rash involving mucus membranes or skin necrosis: No Has patient had a PCN reaction that required hospitalization No Has patient had a PCN reaction occurring within the last 10 years: No If all of the above answers are "NO", then may proceed with Cephalosporin use.   Penicillins Swelling   Has patient had a PCN reaction causing immediate rash, facial/tongue/throat swelling, SOB or lightheadedness with hypotension: Yes Has patient had a PCN reaction causing severe rash involving mucus membranes or skin necrosis: No Has patient had a PCN reaction that required hospitalization No Has patient had a PCN reaction occurring within the last 10 years: No If all of the above answers are "NO", then may proceed with Cephalosporin use.      Medication List       Accurate as of March 15, 2019 11:59 PM. If you have any questions, ask your nurse or doctor.        aspirin 81 MG chewable tablet Chew 81 mg by mouth daily.   bisacodyl 5 MG EC tablet Commonly known as: DULCOLAX Take 1 tablet (5 mg total) by  mouth daily as needed for moderate constipation.   carvedilol 3.125 MG tablet Commonly known as: COREG Take 1 tablet (3.125 mg total) by mouth 2 (two) times daily with a meal.   divalproex 125 MG DR tablet Commonly known as: DEPAKOTE TAKE 1 CAPSULE BY MOUTH AT BEDTIME (DO NOT CRUSH)   divalproex 125 MG DR tablet Commonly known as: DEPAKOTE TAKE 1 CAPSULE BY MOUTH EVERY MORNING (DO NOT CRUSH)   divalproex 125 MG DR tablet Commonly known as: DEPAKOTE TAKE 2 CAPSULES (250MG ) BY MOUTH  AT 3PM (DO NOT CRUSH)   ENSURE ENLIVE PO Take 1 Can by mouth 2 (two) times a day.   ferrous sulfate 325 (65 FE) MG tablet Take 325 mg by mouth daily with breakfast.   MAALOX ADVANCED PO Take 15 mLs by mouth 4 (four) times daily as needed (Indigestion).   memantine 10 MG tablet Commonly known as: NAMENDA Take 10 mg by mouth 2 (two) times daily.   mirtazapine 15 MG tablet Commonly known as: REMERON Take 7.5 mg by mouth at bedtime. 0.5 tablet to = 7.5 mg   multivitamin tablet Take 1 tablet by mouth daily.   nitroGLYCERIN 0.4 MG/SPRAY spray Commonly known as: NITROLINGUAL Place 1 spray under the tongue. Place 1 tab under the tongue every 5 mins PRN for chest pain ( MAX DOSE 3 TABS) notify provider if no relief   traMADol 50 MG tablet Commonly known as: ULTRAM Take 1 tablet (50 mg total) by mouth at bedtime.       No orders of the defined types were placed in this encounter.   Immunization History  Administered Date(s) Administered   Influenza,inj,Quad PF,6+ Mos 07/19/2016   Influenza-Unspecified 06/27/2017   PPD Test 05/28/2016   Pneumococcal Conjugate-13 07/11/2017    Social History   Tobacco Use   Smoking status: Never Smoker   Smokeless tobacco: Never Used  Substance Use Topics   Alcohol use: No    Review of Systems  DATA OBTAINED: from patient-limited; nursing- patient arrived as per history of present illness GENERAL:  no fevers, fatigue, decreased appetite and p.o. intake SKIN: No itching, rash HEENT: No complaint RESPIRATORY: No cough, wheezing, SOB CARDIAC: No chest pain, palpitations, lower extremity edema  GI: No abdominal pain, No N/V/D or constipation, No heartburn or reflux  GU: No dysuria, frequency or urgency, or incontinence  MUSCULOSKELETAL: No unrelieved bone/joint pain NEUROLOGIC: No headache, dizziness  PSYCHIATRIC: No overt anxiety or sadness  Vitals:   03/20/19 1328  BP: 130/83  Pulse: 91  Resp: 18  Temp: (!) 96.9 F  (36.1 C)   Body mass index is 20.47 kg/m. Physical Exam  GENERAL APPEARANCE: Alert, conversant, No acute distress  SKIN: No diaphoresis rash HEENT: Unremarkable RESPIRATORY: Breathing is even, unlabored. Lung sounds are clear   CARDIOVASCULAR: Heart RRR no murmurs, rubs or gallops. No peripheral edema  GASTROINTESTINAL: Abdomen is soft, non-tender, not distended w/ normal bowel sounds.  GENITOURINARY: Bladder non tender, not distended  MUSCULOSKELETAL: No abnormal joints or musculature very thin NEUROLOGIC: Cranial nerves 2-12 grossly intact. Moves all extremities PSYCHIATRIC: Mood and affect dementia, no behavioral issues  Patient Active Problem List   Diagnosis Date Noted   Osteoarthritis 09/09/2018   Anxiety 07/25/2017   Clostridium difficile carrier 12/14/2016   FTT (failure to thrive) in adult 08/04/2016   Mood disorder (HCC) 07/28/2016   Leukocytosis 07/17/2016   Pressure injury of skin 07/17/2016   Malnutrition of moderate degree 07/17/2016   C. difficile diarrhea 07/07/2016  Pedal edema 06/27/2016   Postoperative anemia due to acute blood loss 06/10/2016   Newly recognized heart murmur 06/10/2016   Acute renal failure superimposed on stage 3 chronic kidney disease ([place]) 06/10/2016   Chronic combined systolic and diastolic CHF (congestive heart failure) ([place]) 05/28/2016   Protein-calorie malnutrition, severe 05/25/2016   Left displaced femoral neck fracture ([place]) 05/25/2016   Fall 05/24/2016   Hypokalemia 05/24/2016   AKI (acute kidney injury) ([place]) 05/24/2016   Hypertensive heart disease with chronic combined systolic and diastolic congestive heart failure ([place])    Dementia with behavioral problem ([place])    Depression with anxiety    HLD (hyperlipidemia)    Dysphagia due to recent stroke 05/31/2015   Hyperlipidemia 05/30/2015   Vitamin D deficiency 05/30/2015   Acute CVA (cerebrovascular accident) ([place])    Hx of arterial ischemic  stroke 05/21/2015   CKD (chronic kidney disease) stage 3, GFR 30-59 ml/min ([place]) 05/21/2015   Dementia ([place]) 05/21/2015   Hypertension 05/21/2015   UTI (urinary tract infection) 05/25/2013   Acute renal failure ([place]) 05/25/2013   Syncope 05/24/2013    CMP     Component Value Date/Time   NA 143 07/20/2018   K 4.1 07/20/2018   CL 105 01/03/2017 1526   CO2 23 01/03/2017 1526   GLUCOSE 103 (H) 01/03/2017 1526   BUN 25 (A) 07/20/2018   CREATININE 1.6 (A) 07/20/2018   CREATININE 1.55 (H) 01/03/2017 1526   CALCIUM 9.2 01/03/2017 1526   PROT 6.9 01/03/2017 1526   ALBUMIN 3.2 (L) 01/03/2017 1526   AST 16 06/16/2018   ALT 7 06/16/2018   ALKPHOS 74 06/16/2018   BILITOT 1.1 01/03/2017 1526   GFRNONAA 27 (L) 01/03/2017 1526   GFRAA 31 (L) 01/03/2017 1526   Recent Labs    06/16/18 07/20/18  NA 147 143  K 5.1 4.1  BUN 26* 25*  CREATININE 1.5* 1.6*   Recent Labs    06/16/18  AST 16  ALT 7  ALKPHOS 74   Recent Labs    06/16/18  WBC 6.8   6.8  HGB 11.7*   11.7*  HCT 35*   35*  PLT 201   201   Recent Labs    06/16/18  CHOL 191  LDLCALC 132  TRIG 79   No results found for: [facility] Lab Results  Component Value Date   TSH 2.06 06/16/2018   Lab Results  Component Value Date   HGBA1C 5.9 06/16/2018   Lab Results  Component Value Date   CHOL 191 06/16/2018   HDL 43 06/16/2018   LDLCALC 132 06/16/2018   TRIG 79 06/16/2018   CHOLHDL 5.4 05/22/2015    Significant Diagnostic Results in last 30 days:  No results found.  Assessment and Plan  Failure to thrive/dementia- labs were ordered as well as a valproic acid level labs were normal and valproic acid level was not elevated; Loyal Gambler and I had a discussion, it is time for patient to become comfort care and family is okay with this; patient does not require anything at the moment but if it becomes necessary to use morphine or Ativan we will; we will continue supportive care    Hennie Duos, MD

## 2019-03-23 ENCOUNTER — Encounter: Payer: Self-pay | Admitting: Internal Medicine

## 2019-03-23 ENCOUNTER — Non-Acute Institutional Stay (SKILLED_NURSING_FACILITY): Payer: Medicare Other | Admitting: Internal Medicine

## 2019-03-23 DIAGNOSIS — G301 Alzheimer's disease with late onset: Secondary | ICD-10-CM

## 2019-03-23 DIAGNOSIS — F0281 Dementia in other diseases classified elsewhere with behavioral disturbance: Secondary | ICD-10-CM

## 2019-03-23 DIAGNOSIS — F419 Anxiety disorder, unspecified: Secondary | ICD-10-CM | POA: Diagnosis not present

## 2019-03-23 DIAGNOSIS — R627 Adult failure to thrive: Secondary | ICD-10-CM

## 2019-03-23 NOTE — Progress Notes (Signed)
[facility] Room Number: 416-W Place of Service:  SNF ([id number])  Margit HanksAlexander, Eldred Sooy D, MD  Patient Care Team: Margit HanksAlexander, Khylee Algeo D, MD as PCP - General (Internal Medicine) Pearson GrippeKim, James, MD (Internal Medicine)  Extended Emergency Contact Information Primary Emergency Contact: [facility]Shirley Address: [address]          [place], [place] [address] [place] Home Phone: [phone] Mobile Phone: [phone] Relation: Daughter Secondary Emergency Contact: Asa Saunasliver,Cynthia  [place] Home Phone: [phone] Relation: Granddaughter    Allergies: Penicillins and Penicillins  Chief Complaint  Patient presents with  . Medical Management of Chronic Issues    Routine [facility] visit    HPI: Patient is a 85 y.o. female who is being seen for routine issues of dementia, anxiety, and failure to thrive.  Patient is now comfort care/supportive care.  Past Medical History:  Diagnosis Date  . Acute CVA (cerebrovascular accident) (HCC)   . Acute renal failure (HCC) 05/25/2013  . AKI (acute kidney injury) (HCC)   . CKD (chronic kidney disease) stage 3, GFR 30-59 ml/min (HCC) 05/21/2015  . Closed left hip fracture (HCC)   . Coronary artery disease   . Dementia (HCC)   . Depression with anxiety   . Dysphagia due to recent stroke 05/31/2015  . Fall   . Hx of arterial ischemic stroke 05/21/2015  . Hyperlipidemia   . Hypertension   . Hypokalemia   . Left displaced femoral neck fracture (HCC) 05/25/2016  . Leukocytosis 07/17/2016  . Protein-calorie malnutrition, severe 05/25/2016  . Syncope 05/24/2013  . Transient ischemic attack (TIA)   . Vitamin D deficiency 05/30/2015    Past Surgical History:  Procedure Laterality Date  . ABDOMINAL HYSTERECTOMY    . ANTERIOR APPROACH HEMI HIP ARTHROPLASTY Left 05/25/2016   Procedure: ANTERIOR APPROACH HEMI HIP ARTHROPLASTY;  Surgeon: Samson FredericBrian Swinteck, MD;  Location: [facility];   Service: Orthopedics;  Laterality: Left;    Allergies as of 03/23/2019      Reactions   Penicillins Swelling   Facial and hand swelling per daughter Has patient had a PCN reaction causing immediate rash, facial/tongue/throat swelling, SOB or lightheadedness with hypotension: Yes Has patient had a PCN reaction causing severe rash involving mucus membranes or skin necrosis: No Has patient had a PCN reaction that required hospitalization No Has patient had a PCN reaction occurring within the last 10 years: No If all of the above answers are "NO", then may proceed with Cephalosporin use.   Penicillins Swelling   Has patient had a PCN reaction causing immediate rash, facial/tongue/throat swelling, SOB or lightheadedness with hypotension: Yes Has patient had a PCN reaction causing severe rash involving mucus membranes or skin necrosis: No Has patient had a PCN reaction that required hospitalization No Has patient had a PCN reaction occurring within the last 10 years: No If all of the above answers are "NO", then may proceed with Cephalosporin use.      Medication List       Accurate as of March 23, 2019 11:59 PM. If you have any questions, ask your nurse or doctor.        STOP taking these medications   aspirin 81 MG chewable tablet Stopped by: Merrilee SeashoreAnne Milicent Acheampong, MD   carvedilol 3.125 MG tablet Commonly known as: COREG Stopped by: Merrilee SeashoreAnne Jennice Renegar, MD   divalproex 125 MG DR tablet Commonly known as: DEPAKOTE Stopped by: Merrilee SeashoreAnne Haley Roza, MD   ferrous sulfate  325 (65 FE) MG tablet Stopped by: Merrilee SeashoreAnne Cheyne Bungert, MD   memantine 10 MG tablet Commonly known as: NAMENDA Stopped by: Merrilee SeashoreAnne Geoffry Bannister, MD   mirtazapine 15 MG tablet Commonly known as: REMERON Stopped by: Merrilee SeashoreAnne Aleera Gilcrease, MD   multivitamin tablet Stopped by: Merrilee SeashoreAnne Johm Pfannenstiel, MD   traMADol 50 MG tablet Commonly known as: ULTRAM Stopped by: Merrilee SeashoreAnne Laritza Vokes, MD     TAKE these medications   bisacodyl 5 MG EC tablet Commonly  known as: DULCOLAX Take 1 tablet (5 mg total) by mouth daily as needed for moderate constipation.   ENSURE ENLIVE PO Take 1 Can by mouth 2 (two) times a day.   LORazepam 2 MG tablet Commonly known as: ATIVAN Take 2 mg by mouth every 4 (four) hours as needed for anxiety.   MAALOX ADVANCED PO Take 15 mLs by mouth 4 (four) times daily as needed (Indigestion).   morphine 20 MG/ML concentrated solution Commonly known as: ROXANOL Take 5 mg by mouth every 4 (four) hours as needed for moderate pain or severe pain.   nitroGLYCERIN 0.4 MG/SPRAY spray Commonly known as: NITROLINGUAL Place 1 spray under the tongue. Place 1 tab under the tongue every 5 mins PRN for chest pain ( MAX DOSE 3 TABS) notify provider if no relief       No orders of the defined types were placed in this encounter.   Immunization History  Administered Date(s) Administered  . Influenza,inj,Quad PF,6+ Mos 07/19/2016  . Influenza-Unspecified 06/27/2017, 06/18/2018  . PPD Test 05/28/2016  . Pneumococcal Conjugate-13 07/11/2017    Social History   Tobacco Use  . Smoking status: Never Smoker  . Smokeless tobacco: Never Used  Substance Use Topics  . Alcohol use: No    Review of Systems   unable to obtain secondary to dementia; nursing- patient is comfortable at this point, no complaints    Vitals:   03/23/19 1059  BP: 140/80  Pulse: 93  Resp: 16  Temp: 98.1 F (36.7 C)   Body mass index is 21.68 kg/m. Physical Exam  GENERAL APPEARANCE: Quiet, No acute distress  SKIN: No diaphoresis rash HEENT: Unremarkable RESPIRATORY: Breathing is even, unlabored. Lung sounds are clear   CARDIOVASCULAR: Heart RRR no murmurs, rubs or gallops. No peripheral edema  GASTROINTESTINAL: Abdomen is soft, non-tender, not distended w/ normal bowel sounds.  GENITOURINARY: Bladder non tender, not distended  MUSCULOSKELETAL: No abnormal joints or musculature NEUROLOGIC: Cranial nerves 2-12 grossly intact. Moves all  extremities PSYCHIATRIC: Dementia, no behavioral issues  Patient Active Problem List   Diagnosis Date Noted  . Osteoarthritis 09/09/2018  . Anxiety 07/25/2017  . 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 FTT (failure to thrive) in adult 08/04/2016  . Mood disorder (HCC) 07/28/2016  . Leukocytosis 07/17/2016  . Pressure injury of skin 07/17/2016  . Malnutrition of moderate degree 07/17/2016  . C. difficile diarrhea 07/07/2016  . Pedal edema 06/27/2016  . Postoperative anemia due to acute blood loss 06/10/2016  . Newly recognized heart murmur 06/10/2016  . Acute renal failure superimposed on stage 3 chronic kidney disease (HCC) 06/10/2016  . Chronic combined systolic and diastolic CHF (congestive heart failure) (HCC) 05/28/2016  . Protein-calorie malnutrition, severe 05/25/2016  . Left displaced femoral neck fracture (HCC) 05/25/2016  . Fall 05/24/2016  . Hypokalemia 05/24/2016  . AKI (acute kidney injury) (HCC) 05/24/2016  . Hypertensive heart disease with chronic combined systolic and diastolic congestive heart failure (HCC)   . Dementia with behavioral problem (HCC)   . Depression with anxiety   .  HLD (hyperlipidemia)   . Dysphagia due to recent stroke 05/31/2015  . Hyperlipidemia 05/30/2015  . Vitamin D deficiency 05/30/2015  . Acute CVA (cerebrovascular accident) ([place])   . Hx of arterial ischemic stroke 05/21/2015  . CKD (chronic kidney disease) stage 3, GFR 30-59 ml/min (HCC) 05/21/2015  . Dementia ([place]) 05/21/2015  . Hypertension 05/21/2015  . UTI (urinary tract infection) 05/25/2013  . Acute renal failure ([place]) 05/25/2013  . Syncope 05/24/2013    CMP     Component Value Date/Time   NA 143 07/20/2018   K 4.1 07/20/2018   CL 105 01/03/2017 1526   CO2 23 01/03/2017 1526   GLUCOSE 103 (H) 01/03/2017 1526   BUN 25 (A) 07/20/2018   CREATININE 1.6 (A) 07/20/2018   CREATININE 1.55 (H) 01/03/2017 1526   CALCIUM 9.2 01/03/2017 1526   PROT 6.9 01/03/2017 1526    ALBUMIN 3.2 (L) 01/03/2017 1526   AST 16 06/16/2018   ALT 7 06/16/2018   ALKPHOS 74 06/16/2018   BILITOT 1.1 01/03/2017 1526   GFRNONAA 27 (L) 01/03/2017 1526   GFRAA 31 (L) 01/03/2017 1526   Recent Labs    06/16/18 07/20/18  NA 147 143  K 5.1 4.1  BUN 26* 25*  CREATININE 1.5* 1.6*   Recent Labs    06/16/18  AST 16  ALT 7  ALKPHOS 74   Recent Labs    06/16/18  WBC 6.8  6.8  HGB 11.7*  11.7*  HCT 35*  35*  PLT 201  201   Recent Labs    06/16/18  CHOL 191  LDLCALC 132  TRIG 79   No results found for: [facility] Lab Results  Component Value Date   TSH 2.06 06/16/2018   Lab Results  Component Value Date   HGBA1C 5.9 06/16/2018   Lab Results  Component Value Date   CHOL 191 06/16/2018   HDL 43 06/16/2018   LDLCALC 132 06/16/2018   TRIG 79 06/16/2018   CHOLHDL 5.4 05/22/2015    Significant Diagnostic Results in last 30 days:  No results found.  Assessment and Plan  Dementia End-stage; patient is not eating or drinking well patient is comfort care now; patient has morphine as needed  Anxiety Patient does not appear anxious, however there is Ativan ordered for any anxiety or breathing difficulties  FTT (failure to thrive) in adult All medicines have been stopped except for comfort medicines; patient is comfort care patient has morphine and Ativan.;  Continue supportive care       Hennie Duos, MD

## 2019-03-25 ENCOUNTER — Encounter: Payer: Self-pay | Admitting: Internal Medicine

## 2019-03-25 NOTE — Patient Instructions (Signed)
Ariel Douglas

## 2019-03-25 NOTE — Assessment & Plan Note (Signed)
Patient does not appear anxious, however there is Ativan ordered for any anxiety or breathing difficulties

## 2019-03-25 NOTE — Assessment & Plan Note (Signed)
End-stage; patient is not eating or drinking well patient is comfort care now; patient has morphine as needed

## 2019-03-25 NOTE — Assessment & Plan Note (Signed)
All medicines have been stopped except for comfort medicines; patient is comfort care patient has morphine and Ativan.;  Continue supportive care

## 2019-03-26 ENCOUNTER — Non-Acute Institutional Stay (SKILLED_NURSING_FACILITY): Payer: Medicare Other | Admitting: Internal Medicine

## 2019-03-26 ENCOUNTER — Encounter: Payer: Self-pay | Admitting: Internal Medicine

## 2019-03-26 DIAGNOSIS — F0280 Dementia in other diseases classified elsewhere without behavioral disturbance: Secondary | ICD-10-CM | POA: Diagnosis not present

## 2019-03-26 DIAGNOSIS — R627 Adult failure to thrive: Secondary | ICD-10-CM

## 2019-03-26 DIAGNOSIS — G301 Alzheimer's disease with late onset: Secondary | ICD-10-CM | POA: Diagnosis not present

## 2019-03-26 NOTE — Progress Notes (Signed)
[facility] Room Number: 416-W Place of Service:  SNF (31)  Margit HanksAlexander, Damoni Causby D, MD  Patient Care Team: Margit HanksAlexander, Cassandre Oleksy D, MD as PCP - General (Internal Medicine) Pearson GrippeKim, James, MD (Internal Medicine)  Extended Emergency Contact Information Primary Emergency Contact: [facility] Planoliver-Taylor,Shirley Address: [address]          [place], [place] [id number] [place] Home Phone: [phone] Mobile Phone: [phone] Relation: Daughter Secondary Emergency Contact: Asa Saunasliver,Cynthia  [place] Home Phone: [phone] Relation: Granddaughter    Allergies: Penicillins and Penicillins  Chief Complaint  Patient presents with  . Acute Visit    Patient is seen for failure to thrive.    HPI: Patient is a 85 y.o. female who I am seeing in follow-up for failure to thrive.  I spoke with the [employer] nurse today who felt like she was trying to die.  She still not eating or drinking well.  Family has seen her for goodbye.  Today patient actually looks a little more alert than she did the other day.  Past Medical History:  Diagnosis Date  . Acute CVA (cerebrovascular accident) (HCC)   . Acute renal failure (HCC) 05/25/2013  . AKI (acute kidney injury) (HCC)   . CKD (chronic kidney disease) stage 3, GFR 30-59 ml/min (HCC) 05/21/2015  . Closed left hip fracture (HCC)   . Coronary artery disease   . Dementia (HCC)   . Depression with anxiety   . Dysphagia due to recent stroke 05/31/2015  . Fall   . Hx of arterial ischemic stroke 05/21/2015  . Hyperlipidemia   . Hypertension   . Hypokalemia   . Left displaced femoral neck fracture (HCC) 05/25/2016  . Leukocytosis 07/17/2016  . Protein-calorie malnutrition, severe 05/25/2016  . Syncope 05/24/2013  . Transient ischemic attack (TIA)   . Vitamin D deficiency 05/30/2015    Past Surgical History:  Procedure Laterality Date  . ABDOMINAL HYSTERECTOMY    . ANTERIOR APPROACH HEMI HIP  ARTHROPLASTY Left 05/25/2016   Procedure: ANTERIOR APPROACH HEMI HIP ARTHROPLASTY;  Surgeon: Samson FredericBrian Swinteck, MD;  Location: [facility];  Service: Orthopedics;  Laterality: Left;    Allergies as of 03/26/2019      Reactions   Penicillins Swelling   Facial and hand swelling per daughter Has patient had a PCN reaction causing immediate rash, facial/tongue/throat swelling, SOB or lightheadedness with hypotension: Yes Has patient had a PCN reaction causing severe rash involving mucus membranes or skin necrosis: No Has patient had a PCN reaction that required hospitalization No Has patient had a PCN reaction occurring within the last 10 years: No If all of the above answers are "NO", then may proceed with Cephalosporin use.   Penicillins Swelling   Has patient had a PCN reaction causing immediate rash, facial/tongue/throat swelling, SOB or lightheadedness with hypotension: Yes Has patient had a PCN reaction causing severe rash involving mucus membranes or skin necrosis: No Has patient had a PCN reaction that required hospitalization No Has patient had a PCN reaction occurring within the last 10 years: No If all of the above answers are "NO", then may proceed with Cephalosporin use.      Medication List       Accurate as of March 26, 2019  3:41 PM. If you have any questions, ask your nurse or doctor.        bisacodyl 5 MG EC tablet Commonly known as: DULCOLAX Take 1 tablet (5 mg total) by  mouth daily as needed for moderate constipation.   ENSURE ENLIVE PO Take 1 Can by mouth 2 (two) times a day.   LORazepam 2 MG tablet Commonly known as: ATIVAN Take 2 mg by mouth every 4 (four) hours as needed for anxiety.   MAALOX ADVANCED PO Take 15 mLs by mouth 4 (four) times daily as needed (Indigestion).   morphine 20 MG/ML concentrated solution Commonly known as: ROXANOL Take 5 mg by mouth every 4 (four) hours as needed for moderate pain or severe pain.   nitroGLYCERIN 0.4 MG/SPRAY spray  Commonly known as: NITROLINGUAL Place 1 spray under the tongue. Place 1 tab under the tongue every 5 mins PRN for chest pain ( MAX DOSE 3 TABS) notify provider if no relief       No orders of the defined types were placed in this encounter.   Immunization History  Administered Date(s) Administered  . Influenza,inj,Quad PF,6+ Mos 07/19/2016  . Influenza-Unspecified 06/27/2017, 06/18/2018  . PPD Test 05/28/2016  . Pneumococcal Conjugate-13 07/11/2017    Social History   Tobacco Use  . Smoking status: Never Smoker  . Smokeless tobacco: Never Used  Substance Use Topics  . Alcohol use: No    Review of Systems  DATA OBTAINED: from nurse-only concern is not eating or drinking GENERAL:  no fevers, fatigue, appetite changes SKIN: No itching, rash HEENT: No complaint RESPIRATORY: No cough, wheezing, SOB CARDIAC: No chest pain, palpitations, lower extremity edema  GI: No abdominal pain, No N/V/D or constipation, No heartburn or reflux  GU: No dysuria, frequency or urgency, or incontinence  MUSCULOSKELETAL: No unrelieved bone/joint pain NEUROLOGIC: No headache, dizziness  PSYCHIATRIC: No overt anxiety or sadness  Vitals:   03/26/19 1537  BP: 105/66  Pulse: 95  Resp: 20  Temp: (!) 96.7 F (35.9 C)   Body mass index is 21.68 kg/m. Physical Exam  GENERAL APPEARANCE: Alert, non-conversant, No acute distress  SKIN: No diaphoresis rash HEENT: Unremarkable RESPIRATORY: Breathing is even, unlabored. Lung sounds are clear   CARDIOVASCULAR: Heart RRR no murmurs, rubs or gallops. No peripheral edema  GASTROINTESTINAL: Abdomen is soft, non-tender, not distended w/ normal bowel sounds.  GENITOURINARY: Bladder non tender, not distended  MUSCULOSKELETAL: No abnormal joints or musculature NEUROLOGIC: Cranial nerves 2-12 grossly intact. Moves all extremities PSYCHIATRIC: Dementia, no behavioral issues  Patient Active Problem List   Diagnosis Date Noted  . Osteoarthritis  09/09/2018  . Anxiety 07/25/2017  . 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 FTT (failure to thrive) in adult 08/04/2016  . Mood disorder (HCC) 07/28/2016  . Leukocytosis 07/17/2016  . Pressure injury of skin 07/17/2016  . Malnutrition of moderate degree 07/17/2016  . C. difficile diarrhea 07/07/2016  . Pedal edema 06/27/2016  . Postoperative anemia due to acute blood loss 06/10/2016  . Newly recognized heart murmur 06/10/2016  . Acute renal failure superimposed on stage 3 chronic kidney disease (HCC) 06/10/2016  . Chronic combined systolic and diastolic CHF (congestive heart failure) (HCC) 05/28/2016  . Protein-calorie malnutrition, severe 05/25/2016  . Left displaced femoral neck fracture (HCC) 05/25/2016  . Fall 05/24/2016  . Hypokalemia 05/24/2016  . AKI (acute kidney injury) (HCC) 05/24/2016  . Hypertensive heart disease with chronic combined systolic and diastolic congestive heart failure (HCC)   . Dementia with behavioral problem (HCC)   . Depression with anxiety   . HLD (hyperlipidemia)   . Dysphagia due to recent stroke 05/31/2015  . Hyperlipidemia 05/30/2015  . Vitamin D deficiency 05/30/2015  . Acute CVA (cerebrovascular  accident) ([place])   . Hx of arterial ischemic stroke 05/21/2015  . CKD (chronic kidney disease) stage 3, GFR 30-59 ml/min (HCC) 05/21/2015  . Dementia ([place]) 05/21/2015  . Hypertension 05/21/2015  . UTI (urinary tract infection) 05/25/2013  . Acute renal failure ([place]) 05/25/2013  . Syncope 05/24/2013    CMP     Component Value Date/Time   NA 143 07/20/2018   K 4.1 07/20/2018   CL 105 01/03/2017 1526   CO2 23 01/03/2017 1526   GLUCOSE 103 (H) 01/03/2017 1526   BUN 25 (A) 07/20/2018   CREATININE 1.6 (A) 07/20/2018   CREATININE 1.55 (H) 01/03/2017 1526   CALCIUM 9.2 01/03/2017 1526   PROT 6.9 01/03/2017 1526   ALBUMIN 3.2 (L) 01/03/2017 1526   AST 16 06/16/2018   ALT 7 06/16/2018   ALKPHOS 74 06/16/2018   BILITOT 1.1 01/03/2017 1526    GFRNONAA 27 (L) 01/03/2017 1526   GFRAA 31 (L) 01/03/2017 1526   Recent Labs    06/16/18 07/20/18  NA 147 143  K 5.1 4.1  BUN 26* 25*  CREATININE 1.5* 1.6*   Recent Labs    06/16/18  AST 16  ALT 7  ALKPHOS 74   Recent Labs    06/16/18  WBC 6.8  6.8  HGB 11.7*  11.7*  HCT 35*  35*  PLT 201  201   Recent Labs    06/16/18  CHOL 191  LDLCALC 132  TRIG 79   No results found for: [facility] Lab Results  Component Value Date   TSH 2.06 06/16/2018   Lab Results  Component Value Date   HGBA1C 5.9 06/16/2018   Lab Results  Component Value Date   CHOL 191 06/16/2018   HDL 43 06/16/2018   LDLCALC 132 06/16/2018   TRIG 79 06/16/2018   CHOLHDL 5.4 05/22/2015    Significant Diagnostic Results in last 30 days:  No results found.  Assessment and Plan  Failure to thrive/dementia- patient continues to eat and drink not well, has had her end-of-life family visit, but she is still holding on and actually looks better today and with my last visit; continue supportive care.     Hennie Duos, MD

## 2019-03-27 ENCOUNTER — Encounter: Payer: Self-pay | Admitting: Internal Medicine

## 2019-04-03 DEATH — deceased
